# Patient Record
Sex: Female | Born: 1960 | Race: White | Hispanic: No | Smoking: Former smoker
Health system: Southern US, Community
[De-identification: ages and names within clinical notes are randomized; demographics above are authoritative.]

## PROBLEM LIST (undated history)

## (undated) DIAGNOSIS — G4730 Sleep apnea, unspecified: Secondary | ICD-10-CM

## (undated) DIAGNOSIS — E669 Obesity, unspecified: Secondary | ICD-10-CM

## (undated) DIAGNOSIS — E119 Type 2 diabetes mellitus without complications: Secondary | ICD-10-CM

## (undated) DIAGNOSIS — I10 Essential (primary) hypertension: Secondary | ICD-10-CM

## (undated) DIAGNOSIS — M1990 Unspecified osteoarthritis, unspecified site: Secondary | ICD-10-CM

## (undated) DIAGNOSIS — Z923 Personal history of irradiation: Secondary | ICD-10-CM

## (undated) DIAGNOSIS — C50919 Malignant neoplasm of unspecified site of unspecified female breast: Secondary | ICD-10-CM

## (undated) DIAGNOSIS — I272 Pulmonary hypertension, unspecified: Secondary | ICD-10-CM

## (undated) DIAGNOSIS — J45909 Unspecified asthma, uncomplicated: Secondary | ICD-10-CM

## (undated) HISTORY — DX: Malignant neoplasm of unspecified site of unspecified female breast: C50.919

## (undated) HISTORY — PX: CATARACT EXTRACTION: SUR2

---

## 2006-06-07 ENCOUNTER — Encounter: Admission: RE | Admit: 2006-06-07 | Discharge: 2006-06-07 | Payer: Self-pay | Admitting: Internal Medicine

## 2007-11-20 ENCOUNTER — Ambulatory Visit: Payer: Self-pay | Admitting: Internal Medicine

## 2008-02-14 ENCOUNTER — Emergency Department: Payer: Self-pay

## 2008-02-17 ENCOUNTER — Emergency Department: Payer: Self-pay | Admitting: Emergency Medicine

## 2009-03-01 ENCOUNTER — Ambulatory Visit: Payer: Self-pay | Admitting: Internal Medicine

## 2009-12-08 ENCOUNTER — Inpatient Hospital Stay: Payer: Self-pay | Admitting: Internal Medicine

## 2010-05-10 ENCOUNTER — Ambulatory Visit: Payer: Self-pay | Admitting: Internal Medicine

## 2010-07-09 ENCOUNTER — Emergency Department: Payer: Self-pay | Admitting: Emergency Medicine

## 2010-07-13 ENCOUNTER — Inpatient Hospital Stay: Payer: Self-pay | Admitting: Internal Medicine

## 2010-07-21 ENCOUNTER — Ambulatory Visit: Payer: Self-pay | Admitting: Internal Medicine

## 2010-07-22 ENCOUNTER — Ambulatory Visit: Payer: Self-pay | Admitting: Internal Medicine

## 2011-02-06 ENCOUNTER — Emergency Department: Payer: Self-pay | Admitting: Emergency Medicine

## 2011-04-18 ENCOUNTER — Inpatient Hospital Stay: Payer: Self-pay | Admitting: Physician Assistant

## 2011-04-18 LAB — CBC
HCT: 39.6 % (ref 35.0–47.0)
HGB: 13.3 g/dL (ref 12.0–16.0)
MCH: 29.1 pg (ref 26.0–34.0)
MCHC: 33.5 g/dL (ref 32.0–36.0)
MCV: 87 fL (ref 80–100)
Platelet: 215 10*3/uL (ref 150–440)
RBC: 4.55 10*6/uL (ref 3.80–5.20)
RDW: 13.3 % (ref 11.5–14.5)
WBC: 6.2 10*3/uL (ref 3.6–11.0)

## 2011-04-18 LAB — COMPREHENSIVE METABOLIC PANEL
Albumin: 4 g/dL (ref 3.4–5.0)
Alkaline Phosphatase: 70 U/L (ref 50–136)
Anion Gap: 13 (ref 7–16)
BUN: 10 mg/dL (ref 7–18)
Bilirubin,Total: 0.7 mg/dL (ref 0.2–1.0)
Calcium, Total: 9.1 mg/dL (ref 8.5–10.1)
Chloride: 104 mmol/L (ref 98–107)
Co2: 22 mmol/L (ref 21–32)
Creatinine: 0.89 mg/dL (ref 0.60–1.30)
EGFR (African American): >60
EGFR (Non-African Amer.): >60
Glucose: 120 mg/dL — ABNORMAL HIGH (ref 65–99)
Osmolality: 278 (ref 275–301)
Potassium: 4 mmol/L (ref 3.5–5.1)
SGOT(AST): 41 U/L — ABNORMAL HIGH (ref 15–37)
SGPT (ALT): 51 U/L
Sodium: 139 mmol/L (ref 136–145)
Total Protein: 7.6 g/dL (ref 6.4–8.2)

## 2011-04-18 LAB — LIPID PANEL
Cholesterol: 163 mg/dL (ref 0–200)
HDL Cholesterol: 77 mg/dL — ABNORMAL HIGH (ref 40–60)
Ldl Cholesterol, Calc: 66 mg/dL (ref 0–100)
Triglycerides: 99 mg/dL (ref 0–200)
VLDL Cholesterol, Calc: 20 mg/dL (ref 5–40)

## 2011-04-18 LAB — RAPID INFLUENZA A&B ANTIGENS

## 2011-04-18 LAB — TSH: Thyroid Stimulating Horm: 1.98 u[IU]/mL

## 2011-04-18 LAB — TROPONIN I: Troponin-I: <0.02 ng/mL

## 2011-04-19 LAB — CBC WITH DIFFERENTIAL/PLATELET
Basophil #: 0 10*3/uL (ref 0.0–0.1)
Basophil %: 0.4 %
Eosinophil #: 0 10*3/uL (ref 0.0–0.7)
Eosinophil %: 0.1 %
HCT: 39.9 % (ref 35.0–47.0)
HGB: 13.2 g/dL (ref 12.0–16.0)
Lymphocyte #: 0.7 10*3/uL — ABNORMAL LOW (ref 1.0–3.6)
Lymphocyte %: 14.6 %
MCH: 28.9 pg (ref 26.0–34.0)
MCHC: 33.1 g/dL (ref 32.0–36.0)
MCV: 87 fL (ref 80–100)
Monocyte #: 0.1 10*3/uL (ref 0.0–0.7)
Monocyte %: 2.2 %
Neutrophil #: 4 10*3/uL (ref 1.4–6.5)
Neutrophil %: 82.7 %
Platelet: 191 10*3/uL (ref 150–440)
RBC: 4.57 10*6/uL (ref 3.80–5.20)
RDW: 13.7 % (ref 11.5–14.5)
WBC: 4.8 10*3/uL (ref 3.6–11.0)

## 2011-04-19 LAB — BASIC METABOLIC PANEL
Anion Gap: 14 (ref 7–16)
BUN: 13 mg/dL (ref 7–18)
Calcium, Total: 9.2 mg/dL (ref 8.5–10.1)
Chloride: 102 mmol/L (ref 98–107)
Co2: 24 mmol/L (ref 21–32)
Creatinine: 0.89 mg/dL (ref 0.60–1.30)
EGFR (African American): >60
EGFR (Non-African Amer.): >60
Glucose: 274 mg/dL — ABNORMAL HIGH (ref 65–99)
Osmolality: 289 (ref 275–301)
Potassium: 4.5 mmol/L (ref 3.5–5.1)
Sodium: 140 mmol/L (ref 136–145)

## 2011-04-19 LAB — HEMOGLOBIN A1C: Hemoglobin A1C: 7.7 % — ABNORMAL HIGH (ref 4.2–6.3)

## 2011-04-20 LAB — BASIC METABOLIC PANEL
Anion Gap: 9 (ref 7–16)
BUN: 18 mg/dL (ref 7–18)
Calcium, Total: 9.4 mg/dL (ref 8.5–10.1)
Chloride: 101 mmol/L (ref 98–107)
Co2: 27 mmol/L (ref 21–32)
Creatinine: 0.91 mg/dL (ref 0.60–1.30)
EGFR (African American): >60
EGFR (Non-African Amer.): >60
Glucose: 280 mg/dL — ABNORMAL HIGH (ref 65–99)
Osmolality: 286 (ref 275–301)
Potassium: 4.1 mmol/L (ref 3.5–5.1)
Sodium: 137 mmol/L (ref 136–145)

## 2011-04-20 LAB — CBC WITH DIFFERENTIAL/PLATELET
Basophil #: 0 10*3/uL (ref 0.0–0.1)
Basophil %: 0.2 %
Eosinophil #: 0 10*3/uL (ref 0.0–0.7)
Eosinophil %: 0 %
HCT: 40.5 % (ref 35.0–47.0)
HGB: 13.4 g/dL (ref 12.0–16.0)
Lymphocyte #: 1.4 10*3/uL (ref 1.0–3.6)
Lymphocyte %: 11.1 %
MCH: 29.2 pg (ref 26.0–34.0)
MCHC: 33 g/dL (ref 32.0–36.0)
MCV: 88 fL (ref 80–100)
Monocyte #: 0.6 10*3/uL (ref 0.0–0.7)
Monocyte %: 4.9 %
Neutrophil #: 10.5 10*3/uL — ABNORMAL HIGH (ref 1.4–6.5)
Neutrophil %: 83.8 %
Platelet: 231 10*3/uL (ref 150–440)
RBC: 4.58 10*6/uL (ref 3.80–5.20)
RDW: 13.3 % (ref 11.5–14.5)
WBC: 12.5 10*3/uL — ABNORMAL HIGH (ref 3.6–11.0)

## 2011-04-21 LAB — CBC WITH DIFFERENTIAL/PLATELET
Basophil #: 0 10*3/uL (ref 0.0–0.1)
Basophil %: 0.3 %
Eosinophil #: 0 10*3/uL (ref 0.0–0.7)
Eosinophil %: 0 %
HCT: 39.1 % (ref 35.0–47.0)
HGB: 12.9 g/dL (ref 12.0–16.0)
Lymphocyte #: 1.4 10*3/uL (ref 1.0–3.6)
Lymphocyte %: 12.3 %
MCH: 29.2 pg (ref 26.0–34.0)
MCHC: 33.2 g/dL (ref 32.0–36.0)
MCV: 88 fL (ref 80–100)
Monocyte #: 0.5 10*3/uL (ref 0.0–0.7)
Monocyte %: 4.7 %
Neutrophil #: 9.2 10*3/uL — ABNORMAL HIGH (ref 1.4–6.5)
Neutrophil %: 82.7 %
Platelet: 245 10*3/uL (ref 150–440)
RBC: 4.44 10*6/uL (ref 3.80–5.20)
RDW: 12.9 % (ref 11.5–14.5)
WBC: 11.1 10*3/uL — ABNORMAL HIGH (ref 3.6–11.0)

## 2011-04-21 LAB — BASIC METABOLIC PANEL
Anion Gap: 14 (ref 7–16)
BUN: 22 mg/dL — ABNORMAL HIGH (ref 7–18)
Calcium, Total: 9.1 mg/dL (ref 8.5–10.1)
Chloride: 100 mmol/L (ref 98–107)
Co2: 26 mmol/L (ref 21–32)
Creatinine: 0.93 mg/dL (ref 0.60–1.30)
EGFR (African American): >60
EGFR (Non-African Amer.): >60
Glucose: 284 mg/dL — ABNORMAL HIGH (ref 65–99)
Osmolality: 293 (ref 275–301)
Potassium: 4 mmol/L (ref 3.5–5.1)
Sodium: 140 mmol/L (ref 136–145)

## 2011-04-24 LAB — CULTURE, BLOOD (SINGLE)

## 2011-07-15 ENCOUNTER — Emergency Department: Payer: Self-pay | Admitting: Emergency Medicine

## 2012-03-03 ENCOUNTER — Inpatient Hospital Stay: Payer: Self-pay | Admitting: Internal Medicine

## 2012-03-03 LAB — CBC
HCT: 39.2 % (ref 35.0–47.0)
HGB: 12.7 g/dL (ref 12.0–16.0)
MCH: 28.4 pg (ref 26.0–34.0)
MCHC: 32.4 g/dL (ref 32.0–36.0)
MCV: 88 fL (ref 80–100)
Platelet: 211 10*3/uL (ref 150–440)
RBC: 4.47 10*6/uL (ref 3.80–5.20)
RDW: 13.6 % (ref 11.5–14.5)
WBC: 12.8 10*3/uL — ABNORMAL HIGH (ref 3.6–11.0)

## 2012-03-03 LAB — BASIC METABOLIC PANEL
Anion Gap: 6 — ABNORMAL LOW (ref 7–16)
BUN: 10 mg/dL (ref 7–18)
Calcium, Total: 8.9 mg/dL (ref 8.5–10.1)
Chloride: 102 mmol/L (ref 98–107)
Co2: 29 mmol/L (ref 21–32)
Creatinine: 0.73 mg/dL (ref 0.60–1.30)
EGFR (African American): >60
EGFR (Non-African Amer.): >60
Glucose: 134 mg/dL — ABNORMAL HIGH (ref 65–99)
Osmolality: 275 (ref 275–301)
Potassium: 4 mmol/L (ref 3.5–5.1)
Sodium: 137 mmol/L (ref 136–145)

## 2012-03-03 LAB — RAPID INFLUENZA A&B ANTIGENS

## 2012-03-04 LAB — CBC WITH DIFFERENTIAL/PLATELET
Basophil #: 0 10*3/uL (ref 0.0–0.1)
Basophil %: 0.2 %
Eosinophil #: 0 10*3/uL (ref 0.0–0.7)
Eosinophil %: 0 %
HCT: 37.1 % (ref 35.0–47.0)
HGB: 12.3 g/dL (ref 12.0–16.0)
Lymphocyte #: 1 10*3/uL (ref 1.0–3.6)
Lymphocyte %: 8 %
MCH: 29 pg (ref 26.0–34.0)
MCHC: 33.3 g/dL (ref 32.0–36.0)
MCV: 87 fL (ref 80–100)
Monocyte #: 0.5 x10 3/mm (ref 0.2–0.9)
Monocyte %: 3.8 %
Neutrophil #: 11.1 10*3/uL — ABNORMAL HIGH (ref 1.4–6.5)
Neutrophil %: 88 %
Platelet: 214 10*3/uL (ref 150–440)
RBC: 4.25 10*6/uL (ref 3.80–5.20)
RDW: 13.3 % (ref 11.5–14.5)
WBC: 12.6 10*3/uL — ABNORMAL HIGH (ref 3.6–11.0)

## 2012-03-04 LAB — BASIC METABOLIC PANEL
Anion Gap: 7 (ref 7–16)
BUN: 14 mg/dL (ref 7–18)
Calcium, Total: 9.2 mg/dL (ref 8.5–10.1)
Chloride: 104 mmol/L (ref 98–107)
Co2: 26 mmol/L (ref 21–32)
Creatinine: 0.72 mg/dL (ref 0.60–1.30)
EGFR (African American): >60
EGFR (Non-African Amer.): >60
Glucose: 268 mg/dL — ABNORMAL HIGH (ref 65–99)
Osmolality: 284 (ref 275–301)
Potassium: 4.2 mmol/L (ref 3.5–5.1)
Sodium: 137 mmol/L (ref 136–145)

## 2012-03-04 LAB — MAGNESIUM: Magnesium: 1.8 mg/dL

## 2012-03-04 LAB — HEMOGLOBIN A1C: Hemoglobin A1C: 6.5 % — ABNORMAL HIGH (ref 4.2–6.3)

## 2012-03-06 LAB — CBC WITH DIFFERENTIAL/PLATELET
Basophil #: 0 10*3/uL (ref 0.0–0.1)
Basophil %: 0 %
Eosinophil #: 0 10*3/uL (ref 0.0–0.7)
Eosinophil %: 0.1 %
HCT: 36.5 % (ref 35.0–47.0)
HGB: 12.7 g/dL (ref 12.0–16.0)
Lymphocyte #: 1.4 10*3/uL (ref 1.0–3.6)
Lymphocyte %: 9.2 %
MCH: 30.3 pg (ref 26.0–34.0)
MCHC: 34.8 g/dL (ref 32.0–36.0)
MCV: 87 fL (ref 80–100)
Monocyte #: 0.8 x10 3/mm (ref 0.2–0.9)
Monocyte %: 5.6 %
Neutrophil #: 12.5 10*3/uL — ABNORMAL HIGH (ref 1.4–6.5)
Neutrophil %: 85.1 %
Platelet: 255 10*3/uL (ref 150–440)
RBC: 4.19 10*6/uL (ref 3.80–5.20)
RDW: 13.2 % (ref 11.5–14.5)
WBC: 14.7 10*3/uL — ABNORMAL HIGH (ref 3.6–11.0)

## 2012-03-06 LAB — EXPECTORATED SPUTUM ASSESSMENT W GRAM STAIN, RFLX TO RESP C

## 2012-03-08 LAB — BETA STREP CULTURE(ARMC)

## 2012-03-09 LAB — CULTURE, BLOOD (SINGLE)

## 2012-06-06 ENCOUNTER — Ambulatory Visit: Payer: Self-pay | Admitting: Internal Medicine

## 2012-07-24 ENCOUNTER — Emergency Department: Payer: Self-pay | Admitting: Internal Medicine

## 2013-05-24 ENCOUNTER — Emergency Department: Payer: Self-pay | Admitting: Emergency Medicine

## 2013-05-24 LAB — BASIC METABOLIC PANEL
Anion Gap: 8 (ref 7–16)
BUN: 17 mg/dL (ref 7–18)
Calcium, Total: 8.6 mg/dL (ref 8.5–10.1)
Chloride: 102 mmol/L (ref 98–107)
Co2: 25 mmol/L (ref 21–32)
Creatinine: 1.02 mg/dL (ref 0.60–1.30)
EGFR (African American): >60
EGFR (Non-African Amer.): >60
Glucose: 173 mg/dL — ABNORMAL HIGH (ref 65–99)
Osmolality: 276 (ref 275–301)
Potassium: 3.5 mmol/L (ref 3.5–5.1)
Sodium: 135 mmol/L — ABNORMAL LOW (ref 136–145)

## 2013-05-24 LAB — CBC
HCT: 43.1 % (ref 35.0–47.0)
HGB: 14.1 g/dL (ref 12.0–16.0)
MCH: 28.7 pg (ref 26.0–34.0)
MCHC: 32.8 g/dL (ref 32.0–36.0)
MCV: 87 fL (ref 80–100)
Platelet: 236 10*3/uL (ref 150–440)
RBC: 4.93 10*6/uL (ref 3.80–5.20)
RDW: 13.1 % (ref 11.5–14.5)
WBC: 13.3 10*3/uL — ABNORMAL HIGH (ref 3.6–11.0)

## 2013-05-24 LAB — TROPONIN I: Troponin-I: <0.02 ng/mL

## 2013-07-14 ENCOUNTER — Observation Stay: Payer: Self-pay | Admitting: Internal Medicine

## 2013-07-14 LAB — CBC
HCT: 38.8 % (ref 35.0–47.0)
HGB: 12.9 g/dL (ref 12.0–16.0)
MCH: 29 pg (ref 26.0–34.0)
MCHC: 33.3 g/dL (ref 32.0–36.0)
MCV: 87 fL (ref 80–100)
Platelet: 221 10*3/uL (ref 150–440)
RBC: 4.45 10*6/uL (ref 3.80–5.20)
RDW: 13.6 % (ref 11.5–14.5)
WBC: 8.1 10*3/uL (ref 3.6–11.0)

## 2013-07-14 LAB — BASIC METABOLIC PANEL
Anion Gap: 3 — ABNORMAL LOW (ref 7–16)
BUN: 10 mg/dL (ref 7–18)
Calcium, Total: 8.8 mg/dL (ref 8.5–10.1)
Chloride: 110 mmol/L — ABNORMAL HIGH (ref 98–107)
Co2: 28 mmol/L (ref 21–32)
Creatinine: 0.92 mg/dL (ref 0.60–1.30)
EGFR (African American): >60
EGFR (Non-African Amer.): >60
Glucose: 116 mg/dL — ABNORMAL HIGH (ref 65–99)
Osmolality: 281 (ref 275–301)
Potassium: 3.7 mmol/L (ref 3.5–5.1)
Sodium: 141 mmol/L (ref 136–145)

## 2013-07-14 LAB — TROPONIN I: Troponin-I: <0.02 ng/mL

## 2013-07-15 LAB — CBC WITH DIFFERENTIAL/PLATELET
Basophil #: 0 10*3/uL (ref 0.0–0.1)
Basophil %: 0 %
Eosinophil #: 0 10*3/uL (ref 0.0–0.7)
Eosinophil %: 0 %
HCT: 37.3 % (ref 35.0–47.0)
HGB: 12.5 g/dL (ref 12.0–16.0)
Lymphocyte #: 1 10*3/uL (ref 1.0–3.6)
Lymphocyte %: 10.9 %
MCH: 29.2 pg (ref 26.0–34.0)
MCHC: 33.5 g/dL (ref 32.0–36.0)
MCV: 87 fL (ref 80–100)
Monocyte #: 0.4 x10 3/mm (ref 0.2–0.9)
Monocyte %: 4 %
Neutrophil #: 8 10*3/uL — ABNORMAL HIGH (ref 1.4–6.5)
Neutrophil %: 85.1 %
Platelet: 196 10*3/uL (ref 150–440)
RBC: 4.28 10*6/uL (ref 3.80–5.20)
RDW: 13.7 % (ref 11.5–14.5)
WBC: 9.4 10*3/uL (ref 3.6–11.0)

## 2013-07-15 LAB — BASIC METABOLIC PANEL
Anion Gap: 8 (ref 7–16)
BUN: 14 mg/dL (ref 7–18)
Calcium, Total: 9.1 mg/dL (ref 8.5–10.1)
Chloride: 104 mmol/L (ref 98–107)
Co2: 26 mmol/L (ref 21–32)
Creatinine: 1.05 mg/dL (ref 0.60–1.30)
EGFR (African American): >60
EGFR (Non-African Amer.): >60
Glucose: 212 mg/dL — ABNORMAL HIGH (ref 65–99)
Osmolality: 282 (ref 275–301)
Potassium: 4.1 mmol/L (ref 3.5–5.1)
Sodium: 138 mmol/L (ref 136–145)

## 2013-07-19 LAB — CULTURE, BLOOD (SINGLE)

## 2013-09-27 ENCOUNTER — Emergency Department: Payer: Self-pay | Admitting: Emergency Medicine

## 2014-02-17 ENCOUNTER — Ambulatory Visit: Payer: Self-pay | Admitting: Internal Medicine

## 2014-03-03 ENCOUNTER — Ambulatory Visit: Payer: Self-pay | Admitting: Internal Medicine

## 2014-06-12 NOTE — Discharge Summary (Signed)
PATIENT NAME:  Nancy Patton, Nancy Patton MR#:  [id number] DATE OF BIRTH:  [birth year]  DATE OF ADMISSION:  03/03/2012 DATE OF DISCHARGE:  03/07/2012  DISCHARGE DIAGNOSIS:  Asthma exacerbation.   SECONARY DIAGOSES:  Diabetes mellitus, morbid obesity, asthma.   DISCHARGE MEDICATIONS:  Additional medications:   1.  Prednisone 60 mg taper by 10 mg every day.  2.  Levaquin 500 mg daily x 5 days.  3.  Glipizide 5 mg twice a day while she is on prednisone taper.   4.  The patient to resume other home medications per discharge medical reconciliation.   DISCHARGE INSTRUCTIONS:   DIET:  ADA diet.   ACTIVITY LIMITATIONS:  None.   FOLLOW-UP:  One to two weeks with Dr. Lamonte Sakai.   HOSPITAL COURSE:  Threasa Beards was admitted through the Emergency Room complaining of acute shortness of breath and wheezing.  Impression of acute respiratory failure, was admitted to the Emergency Room with pneumonia, early sepsis.  Please refer to history and physical for details.  The patient was treated with ceftriaxone, erythromycin, nebulized bronchodilators, high-dose intravenous steroids.  She made a slow, but sustained improvement over the next three days with marked improvement in oxygen requirements and discharged to home without home oxygen.  Hospital stay was otherwise uncomplicated.  Her blood sugars were elevated due to steroids, however this is management with Lantus insulin with bolus short-acting insulin.   PROCEDURES:  Imaging, chest x-ray.   DISPOSITION:  To home.   DISCHARGE PROCESS TIME SPENT:  Thirty-seven minutes.     ____________________________ Venetia Maxon Elijio Miles, MD sat:ea D: 03/15/2012 12:55:27 ET T: 03/15/2012 23:34:41 ET JOB#: [id number]  cc: Sheikh A. Elijio Miles, MD, <Dictator> Veverly Fells MD ELECTRONICALLY SIGNED 03/18/2012 15:03

## 2014-06-12 NOTE — H&P (Signed)
PATIENT NAME:  Nancy Patton, Nancy Patton MR#:  [id number] DATE OF BIRTH:  04-Dec-1960  DATE OF ADMISSION:  03/03/2012  REFERRING PHYSICIAN: Shirley Friar. Michel Santee, MD   PRIMARY CARE PHYSICIAN: Lamonte Sakai, MD  CHIEF COMPLAINT: Shortness of breath.   HISTORY OF PRESENT ILLNESS: The patient is a pleasant 54 year old Caucasian female with obesity, asthma, hypertension and history of pneumonia, who presents with 2 days of shortness of breath, cough which is productive with brownish sputum with fevers. The patient stated that she has progressively gotten worse despite her nebulizers and inhalers. She has no sick contacts. She has had brownish sputum and here has had several nebs without significant improvement. She was noted to have some hypoxia with O2 saturations of 88%. She is on home oxygen only as needed, mostly at nights. She has low-grade fever here. X-ray of the chest also shows a probable pneumonia. Hospitalist services were contacted for further evaluation and management. The patient has had no sick contacts or recent antibiotic use.   PAST MEDICAL HISTORY:  Admission for pneumonia in the past, asthma with asthma exacerbations in the past, diabetes, hypertension, obesity.   OUTPATIENT MEDICATIONS:  HCTZ/losartan 12.5/50 mg 1 tab once a day, metformin 1000 mg 2 times a day, ProAir HFA 2 puffs 4 times a day as needed for shortness of breath, Singulair 10 mg once a day, Spiriva 18 mcg daily, Symbicort 160/4.5 mcg 2 puffs 2 times a day.   ALLERGIES: None.   FAMILY HISTORY: Mom with high blood pressure.  Dad with diabetes.   SOCIAL HISTORY: Works in [employer] as a Museum/gallery conservator. No tobacco, alcohol or drug use.   REVIEW OF SYSTEMS:   CONSTITUTIONAL: Positive for fatigue, weakness. No weight changes.  EYES: No blurry vision or double vision.  ENT: No tinnitus. Positive for headache and some sore throat and hoarseness.   RESPIRATORY: Positive for cough, wheezing, shortness of breath, dyspnea on exertion and  history of asthma.  CARDIOVASCULAR: No chest pain, palpitations or orthopnea. High blood pressure history is there.  GASTROINTESTINAL: No nausea, vomiting, diarrhea, abdominal pain, hematemesis or bright red blood per rectum.  GENITOURINARY: No dysuria or hematuria.  SKIN: No new rashes.  MUSCULOSKELETAL: Some aches and pains but no focal weakness or numbness.  HEMATOLOGIC/LYMPHATIC: No anemia or easy bruising.  NEUROLOGICAL: No numbness focally, no weakness focally. No history of transient ischemic attack.  PSYCHIATRIC: No anxiety or insomnia.   PHYSICAL EXAMINATION: VITAL SIGNS: Temperature on arrival was 100.3, pulse 102, respiratory rate 20, blood pressure 125/68, O2 sat was 88% on room air.  GENERAL: The patient is an obese Caucasian female lying in bed in mild respiratory distress.  HEENT: Normocephalic, atraumatic. Pupils are equal and reactive. Anicteric sclerae. Extraocular muscles are intact. Moist mucous membranes.  NECK: Supple. No significant lymphadenopathy or thyroid tenderness in the neck examination.  LUNGS: Diffuse rhonchi and wheezing. Decreased breath sounds and air movement of bilateral lungs, more in the bases. Good effort, tachypneic.  CARDIOVASCULAR: S1, S2, tachycardic. No murmurs, rubs, or gallops.  ABDOMEN: Soft, nontender, nondistended. Positive bowel sounds in all quadrants.  EXTREMITIES: No significant edema.  SKIN: Scattered tattoos.  NEUROLOGICAL: Cranial nerves II through XII are grossly intact. Strength is 5 out of 5 in all extremities. Sensation is intact to light touch.  PSYCHIATRIC: Awake, alert, oriented x3, pleasant and cooperative.   LABORATORY, DIAGNOSTIC AND RADIOLOGICAL DATA:  Glucose 134, BUN 10, creatinine 0.73. WBC 10.8, hemoglobin 12.7, platelets 211. Rapid flu negative.   X-ray of the  chest, PA and lateral: Showing abnormal density in the right infrahilar region posteriorly and the right middle lobe consistent with atelectasis or pneumonia.    ASSESSMENT AND PLAN: We have a pleasant 54 year old morbidly obese Caucasian female with asthma, with pneumonia history in the past, with chronic respiratory failure on p.r.n. oxygen nocturnally, who presents with acute on chronic respiratory failure, shortness of breath, wheezing, productive cough, with sepsis with tachycardia, leukocytosis and evidence for a probable pneumonia. At this point, we would admit the patient to the hospital. She presented with O2 sats 88% on room air, wheezing, labored, and still is short of breath and has significant wheezing. I would start IV steroids and around-the-clock nebulizers, continue the Spiriva and Singulair. I would start the patient on ceftriaxone and azithromycin at this point, and keep the oxygen saturations above 95%, and consider BiPAP if she does not improve.   In regards to the sepsis, it is likely from the pneumonia. She has tachycardia and leukocytosis. She has no sick contacts. Rapid flu is negative. The patient does have some sore throat, and we would check a strep throat for that, order blood cultures and follow with sputum cultures which have been ordered.   In regards to her diabetes, we would hold the metformin and start sliding scale insulin, and I anticipate her sugars might be elevated given the steroids she will be on. I would continue the outpatient blood pressure medications at this point and start the patient on DVT prophylaxis with Lovenox.   CODE STATUS: The patient is FULL CODE.    TOTAL TIME SPENT:  60 minutes.  ____________________________ Vivien Presto, MD sa:cb D: 03/03/2012 12:22:33 ET T: 03/03/2012 14:45:14 ET JOB#: [id number]  cc: Vivien Presto, MD, <Dictator> Perrin Maltese, MD Karel Jarvis [facility] MD ELECTRONICALLY SIGNED 03/11/2012 20:35

## 2014-06-13 NOTE — Discharge Summary (Signed)
PATIENT NAME:  Nancy Patton, Nancy Patton MR#:  [id number] DATE OF BIRTH:  Aug 10, 1960  DATE OF ADMISSION:  07/14/2013 DATE OF DISCHARGE:  07/16/2013  ADMITTING PHYSICIAN: Epifanio Lesches, MD  DISCHARGING PHYSICIAN: Pernell Dupre, MD  ADMITTING DIAGNOSIS: Acute exacerbation of asthma.   SECONDARY DIAGNOSES:  1.  Hypertension.  2.  Type 2 diabetes mellitus.  3.  Pneumonia.  IMAGING: Chest x-ray.   PROCEDURES PERFORMED: None.   HOSPITAL COURSE: This lady was admitted through the Emergency Room complaining of  dyspnea and increased wheezing. Her chest x-ray revealed minimal left basilar opacity concerning for mild pneumonia. The patient was admitted to the medical floor. She received antibiotics for pneumonia, high dose intravenous steroids and bronchodilators for asthma. Her symptoms improved considerably over the next 48 hours. Her hospital stay was otherwise uncomplicated and she was discharged to home in satisfactory condition.   DIET: Low-sodium, low-fat, low-cholesterol, ADA diet.   ACTIVITY: As tolerated.   DISCHARGE FOLLOWUP: Follow up with Dr. Elijio Miles or Dr. Lamonte Sakai in 1 to 2 weeks.   DISCHARGE MEDICATIONS:  1.  Augmentin 875 mg b.i.d.  2.  Zithromax 500 mg daily. 3.  Prednisone 60 mg taper, reduce x 10 mg until complete.  4.  Dulera inhaler 2 puffs b.i.d.  5.  Escitalopram 20 mg daily.  6.  DuoNeb inhaler p.r.n. q. 4.  7.  Metformin 1000 mg b.i.d.  8.  Singulair 10 mg q. p.m.  9.  Hydrochlorothiazide/losartan 12.5/50 daily.   DISCHARGE PROCESS TIME SPENT: 35 minutes.   ____________________________ Venetia Maxon Elijio Miles, MD sat:sb D: 07/29/2013 13:34:25 ET T: 07/29/2013 13:54:13 ET JOB#: [id number]  cc: Alfredia Ferguson A. Elijio Miles, MD, <Dictator> Veverly Fells MD ELECTRONICALLY SIGNED 08/01/2013 13:30

## 2014-06-13 NOTE — H&P (Signed)
PATIENT NAME:  Nancy Patton, HECKMANN MR#:  [id number] DATE OF BIRTH:  01-14-1961  DATE OF ADMISSION:  07/14/2013  PRIMARY CARE PHYSICIAN: Lamonte Sakai, MD  EMERGENCY ROOM PHYSICIAN: Lurline Hare, MD  CHIEF COMPLAINT: Trouble breathing.   HISTORY OF PRESENT ILLNESS: This is a 54 year old female patient with morbid obesity, diabetes, and hypertension who came in because of shortness of breath. The patient has been having trouble breathing for about 3 to 4 days associated with wheezing, cough and productive phlegm. The patient did not have any fever. The patient tried inhalers and nebulizers at home with no relief. The patient came in because of trouble breathing. The patient received nebulizers and steroids but still did not improve so ER doctor asked [place] to admit. The patient's chest x-ray showed slight pneumonia, but main problem is her significant wheezing and also hypoxia. Her O2 sat dropped to 92 to 93% on 1 liter. The patient denies any pedal edema or orthopnea. Has history of asthma for a long time. No history of smoking. The patient has no history of sick contacts.   PAST MEDICAL HISTORY: Significant for hypertension, diabetes mellitus type 2, asthma and seasonal allergies.   SOCIAL HISTORY: No smoking, no drinking, no drugs. Works at [employer].   FAMILY HISTORY: Mother has diabetes.   PAST SURGICAL HISTORY: No history of surgeries.  HOME MEDICATIONS: Include:  1.  Celexa 20 mg p.o. daily. 2.  Dulera 5/200 mcg 2 puffs b.i.d.  3.  DuoNebs every 4 hours as needed.  4.  HCTZ/losartan 12.5/50 mg p.o. daily.  5.  Metformin 1 gram p.o. b.i.d.  6.  Singulair 10 mg p.o. daily.   REVIEW OF SYSTEMS:  CONSTITUTIONAL: Feels fatigued.  EYES: No blurred vision.  ENT: No tinnitus. No ear pain. No epistaxis. No difficulty swallowing.  RESPIRATORY: Does have cough, trouble breathing and wheezing going on for 3 to 4 days.  CARDIOVASCULAR: No chest pain. Does feel tight in the chest. No orthopnea,  PND. No  palpitations.  GASTROINTESTINAL: No nausea. No vomiting. No abdominal pain.  GENITOURINARY: No dysuria.  ENDOCRINE: No polyuria or nocturia.  INTEGUMENTARY: No skin rashes.  MUSCULOSKELETAL: No joint pain.  NEUROLOGIC: No numbness or weakness.  PSYCHIATRIC: No anxiety or insomnia.   PHYSICAL EXAMINATION: VITAL SIGNS: Temperature 98.4, heart rate 84, blood pressure 174/96, and O2 sats dropped to 92% on room air. On 1 liter, sats are 96%, but occasionally drop to 93% on 1 liter.  GENERAL: This is a 54 year old well-developed, well-nourished female, not in distress at this time. HEENT: Head: Normocephalic, atraumatic. Eyes: Pupils equally reacting to light. Ears: External auditory canals are normal. Nose: No turbinate hypertrophy. No oropharyngeal erythema.  NECK: Supple. No JVD. No carotid bruit. The patient able to move neck with normal range of motion without any pain. No JVD.  HEART: S1, S2 regular. No murmurs. PMI not displaced. Pulses are intact in femoral and dorsalis pedis.  LUNGS: The patient has bilateral expiratory wheeze in all lung fields. The patient is not using accessory muscles of respiration.  ABDOMEN: Soft, nontender, nondistended. Bowel sounds present. No hernias. The patient has no CVA tenderness.  SKIN: Normal color. No rashes. Warm and dry.  EXTREMITIES: Normal range of motion. No pedal edema. The patient has no calf sign.  NEUROLOGIC: Alert, awake, oriented. Cranial nerves II through XII intact. Power 5/5 in upper and lower extremities. Sensation is intact. DTRs 2+ bilaterally.  PSYCHIATRIC: Mood and affect are within normal limits.   DIAGNOSTIC  DATA: WBC 8.1, hemoglobin 12.9, hematocrit 38.8, platelets 221. Troponin less than 0.02. Electrolytes: Sodium 141, potassium 3.7, chloride 110, bicarbonate 28, BUN 10, creatinine 0.93, glucose 116.   Chest x-ray shows minimal left base opacity concerning for mild pneumonia.   ASSESSMENT AND PLAN: 1.  The patient is a  54 year old female patient with acute hypoxia secondary to asthma attack. The patient is going to be admitted to hospitalist service under observation for asthma attack. Will continue IV steroids for 24 hours along with IV antibiotics and continue DuoNebs q. 4 hours and see how she does. I believe she should be able to turn around nicely and will be discharged in a day. If she does not get better, she will be continued on antibiotics and repeat chest x-ray.  2.  Hypertension. Continue home medications with HCTZ and losartan.  3.  Diabetes mellitus, type 2. Continue metformin. Will do sliding scale with coverage also because she is on steroids and sugars will be high. 4.  History of allergies. The patient is on Singulair, continue that.   We are going to sign out to Dr. Elijio Miles.   TIME SPENT ON HISTORY AND PHYSICAL: About 55 minutes.  ____________________________ Epifanio Lesches, MD sk:sb D: 07/14/2013 07:44:13 ET T: 07/14/2013 08:26:21 ET JOB#: [id number]  cc: Epifanio Lesches, MD, <Dictator> Epifanio Lesches MD ELECTRONICALLY SIGNED 07/17/2013 13:21

## 2014-06-14 NOTE — Discharge Summary (Signed)
PATIENT NAME:  Nancy Patton, Nancy Patton MR#:  [id number] DATE OF BIRTH:  10-19-1960  DATE OF ADMISSION:  04/18/2011 DATE OF DISCHARGE:  04/21/2011  DISCHARGE DIAGNOSES:  1. Chronic asthmatic bronchitis/chronic obstructive asthma. 2. Exacerbation of #1 above. 3. Diabetes mellitus. 4. Morbid obesity.  5. Hypertension.   DISCHARGE MEDICATIONS: The patient is to continue using her albuterol rescue inhaler (SABA) and/or DuoNebs as needed up to 4 times a day for acute asthmatic bronchitis symptoms. The patient has been educated on safe use of both of these so as to avoid overuse. The patient is to continue taking hydrochlorothiazide/losartan 12.5 mg/50 mg.   Additionally, the patient's medication regimen was adjusted extensively while she is in the hospital, and the patient will begin taking the following medications, as per Internal Medicine and Pulmonology.  1. Symbicort 160/4.5, two puffs twice a day; gargle, swish, and spit after use.  2. Spiriva, inhale the contents of 1 capsule once a day (for best effect, do 2 inhalations for every 1 capsule).  3. Theophylline SA tablet (12 hour)/Theo-Dur, 100 mg, 1 tab twice a day with meals as directed by pulmonology.  4. Singulair, 10 mg, 1 tablet by mouth once a day.  5. Prednisone, 20 mg, wean as directed and as per your hand-written instructions.  6. Augmentin, 875-125 mg, one tab by mouth twice a day for seven days.  7. Azithromycin, 250 mg, 1 tab by mouth once a day for two more days.  8. Ondansetron, 4 mg, 1 tab by mouth up to every eight hours as needed for nausea while taking steroids.  9. Metformin, 1000 mg, 1 tab by mouth twice a day with meals.  10. Glipizide, 5 mg, 1 tab by mouth once a day when taking steroids.   HOSPITAL COURSE: This patient is a 54 year old Caucasian female with past medical history significant for refractory asthmatic bronchitis/chronic obstructive asthma who presented to [facility] on the date of admission  with complaints shortness of breath and cough. Please see history and physical examination from the date of admission for full details regarding initial triage, evaluation, and treatment. The patient was admitted to a standard medical bed and received treatment in the standard fashion for acute asthmatic bronchitis/chronic obstructive asthma exacerbation, including IV steroids, DuoNebs, and the like. PA and lateral chest x-ray revealed findings consistent with possible atelectasis of the right middle lobe. However, these findings were clinically questionable given the patient's presentation, including, among other things, a reported temperature of 102 degrees Fahrenheit on initial presentation to the emergency room. Thus, the patient was covered with standard antimicrobial agents for possible community-acquired pneumonia. Pulmonology was consulted regarding the patient's care as well. Internal Medicine and Pulmonology did adjust the patient's medication throughout her hospitalization, and these adjustments did result in remarkable improvement in the patient's symptoms. The patient rapidly improved during her hospital stay. On the day of discharge, the patient reported feeling "great" and was eager to return to home and work. The patient was discharged from the hospital in satisfactory condition.  DIET: ADA, low cholesterol diet.   ACTIVITY: No overly-exertional activity/activity as tolerated within safe and reasonable limitations.  DISCHARGE FOLLOWUP: Follow-up visit with Dr. Devona Konig and Wilnette Kales PA-C, MSPAS, both within one week of hospital discharge.   TIME SPENT: Discharge time spent including coordination of care, patient education, and the like was greater than 30 minutes. ____________________________ Kizzie Furnish Audie Clear, dictating on behalf of Dr. Elijio Miles mgm:slb D: 04/21/2011 10:33:31 ET T: 04/21/2011 79:02:40  ET JOB#: [id number]  cc: Kizzie Furnish. Prudence Davidson, PA, <Dictator> Elwyn Reach  PA ELECTRONICALLY SIGNED 05/01/2011 19:43 Veverly Fells MD ELECTRONICALLY SIGNED 05/06/2011 12:29

## 2014-06-14 NOTE — H&P (Signed)
PATIENT NAME:  Nancy Patton, Nancy Patton MR#:  [id number] DATE OF BIRTH:  27-Jan-1961  DATE OF ADMISSION:  04/18/2011  PRIMARY CARE PHYSICIAN: Dr. Lamonte Sakai  HISTORY OF PRESENT ILLNESS:  The patient is a 54 year old Caucasian female with past medical history significant for history of asthma who presented to the hospital with complaints of shortness of breath as well as a cough.  According to the patient she was doing well up until approximately yesterday when she started having worsening wheezing as well as shortness of breath and coughing. It progressively got worse. On arrival to the Emergency Room, the patient's temperature was noted to be above 102 and she was also having chills. She also admitted having some diarrhea yesterday and  today. She has been coughing white phlegm, however denied any sick contacts. Because of that discomfort, she decided to come to the Emergency Room where she was noted to have oxygen saturations of 92% on room air and hospitalist services were contacted for admission.   PAST MEDICAL HISTORY:  1. Admission for pneumonia in May 2012.  2. Asthma. 3. Diabetes mellitus since 11/2009.   4. History of hypertension.   MEDICATIONS:  1. Allegra once daily. 2. Advair Diskus 250/50 twice daily.  3. Metformin 500 mg twice daily.  4. Hydrochlorothiazide losartan 12.5/50 mg once daily.   FAMILY HISTORY: The patient's father had diabetes. The patient's mother had hypertension.   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 Nonsmoker. No alcohol. She denies any drug abuse.   REVIEW OF SYSTEMS: Positive for cough, fevers, chills, shortness of breath, and wheezing, also fatigue and weak since yesterday. Pains in her shoulders as well as her legs in the thighs. Feeling dizzy, especially with coughing episodes and getting up from the chair, and arrhythmia with cough episodes. Some blood in her nasal secretions. Otherwise, denies any weight loss or gain.  EYES: In regards to  eyes, denies any blurred vision, double vision, glaucoma, or cataracts.  ENT: Denies any tinnitus, allergies, epistaxis, sinus pain, dentures, or difficulty swallowing. RESPIRATORY:  Denies any hemoptysis, painful respirations, or chronic obstructive pulmonary disease.  CARDIOVASCULAR: Denies any chest pains, orthopnea, edema, palpitations, or syncope. GASTROINTESTINAL: Denies nausea, vomiting, hematemesis, rectal bleeding, or change in bowel habits. GENITOURINARY: Denies any dysuria, hematuria, frequency, or incontinence. ENDOCRINOLOGIC: Denies any polydipsia, nocturia, thyroid problems, heat or cold intolerance, or thirst. HEMATOLOGIC: Denies anemia, easy bruising, bleeding, or swollen glands. SKIN: Denies any acne, rashes, lesions, or change in moles. MUSCULOSKELETAL: Denies arthritis, cramps, swelling, or gout. The patient admits to left heel pain when she stands for a prolonged period of time or walks. NEUROLOGIC: No numbness, epilepsy, or tremor.  PSYCH: No anxiety, insomnia, or depression.   PHYSICAL EXAMINATION:  VITAL SIGNS: On arrival to the hospital, the patient's temperature was 102.2, pulse 110, respirations 28, blood pressure 138/75, saturation 92% on room air.   GENERAL: This is an obese Caucasian female in moderate distress secondary to coughing and wheezing as well as shortness of breath.   HEENT: Pupils equal and reactive to light. Extraocular movements intact. No icterus or conjunctivitis. Has normal hearing. No pharyngeal erythema. Mucosa is moist.   NECK: Neck did not reveal any masses. Supple, nontender. Thyroid not enlarged. No adenopathy. No JVD or carotid bruits bilaterally. Full range of motion.   LUNGS: Markedly diminished breath sounds as well as wheezing, inspiratory as well as expiratory, prolonged expiratory phase, labored inspirations as well as increased effort to breathe, in mild respiratory distress  especially when she moves around even in the bed or talks.    CARDIOVASCULAR: S1, S2 appreciated. No murmurs, gallops, or rubs noted. Distant.  PMI not lateralized. Chest is nontender to palpation. 1+ pedal pulses. No lower extremity edema, calf tenderness, or cyanosis noted.   ABDOMEN: Soft and nontender. Bowel sounds are present. No hepatosplenomegaly or masses were noted.   RECTAL: Deferred.   MUSCULOSKELETAL: Able to move all extremities. No cyanosis, degenerative joint disease, or kyphosis. Gait is not tested.   SKIN: Skin did not reveal any rashes, lesions, erythema, or induration. It was warm and dry to palpation.   LYMPH: No adenopathy in the cervical region.   NEUROLOGIC: Cranial nerves grossly intact. Sensory is intact. No dysarthria or aphasia.   PSYCH: The patient is alert, oriented to time, person, and place, cooperative. Memory is good. No significant confusion, agitation, or depression noted.   LABORATORY DATA:  The patient's EKG showed sinus tach at 105 beats per minute, normal axis, possible left atrial enlargement, no acute ST-T changes were noted. Chest x-ray revealed right middle lobe atelectasis as well as reactive airway disease, PA and lateral.  Influenza A and B antigens were negative. Basic metabolic panel showed glucose 120, otherwise unremarkable. Liver enzymes showed AST elevation to 41. Cardiac enzymes- first set negative. CBC within normal limits.   ASSESSMENT AND PLAN:  1. Systemic inflammatory  response reaction with fever, tachypnea, and tachycardia: Admit the patient to the medical floor. Blood cultures were taken.  The patient will be started on an antibiotic with Rocephin and  Zithromax IV for possible bronchitis versus mild pneumonia in the  right middle lobe. We will get sputum cultures and adjust antibiotics according to culture results. Asthma exacerbation:  Continue the patient on inhalation therapy as well as steroids. Get pulmonologist involved because the patient tells me that she gets exacerbations every few  months and, in fact, she has wheezing even in between exacerbations.  I think that the patient's medications need to be adjusted and she needs to be closely followed.  2. Acute bronchitis: Continue antibiotics. As mentioned above, we will get sputum cultures.  3. Diabetes mellitus: We will continue metformin as well as sliding scale insulin. 4. Hypertension: Continue losartan as well as hydrochlorothiazide.  5. Elevated transaminases: Of unclear etiology at this time, possibly related to fatty liver disease. However, the patient's AST was normal in the past so we will follow the patient's transaminases in the morning.  6. Obesity: We will check the patient's hemoglobin A1c again and her TSH as well as lipid panel.   TIME SPENT:  One hour.   ____________________________ Theodoro Grist, MD rv:bjt D: 04/18/2011 22:45:39 ET T: 04/19/2011 07:32:48 ET JOB#: [id number]  cc: Theodoro Grist, MD, <Dictator> Perrin Maltese, MD Theodoro Grist MD ELECTRONICALLY SIGNED 04/19/2011 14:33

## 2014-07-08 ENCOUNTER — Emergency Department: Payer: Managed Care, Other (non HMO)

## 2014-07-08 ENCOUNTER — Emergency Department
Admission: EM | Admit: 2014-07-08 | Discharge: 2014-07-08 | Disposition: A | Discharge status: Home / Self Care | Payer: Managed Care, Other (non HMO) | Attending: Emergency Medicine | Admitting: Emergency Medicine

## 2014-07-08 ENCOUNTER — Encounter: Payer: Self-pay | Admitting: Emergency Medicine

## 2014-07-08 ENCOUNTER — Other Ambulatory Visit: Payer: Self-pay

## 2014-07-08 DIAGNOSIS — J45901 Unspecified asthma with (acute) exacerbation: Secondary | ICD-10-CM | POA: Diagnosis not present

## 2014-07-08 DIAGNOSIS — I10 Essential (primary) hypertension: Secondary | ICD-10-CM | POA: Diagnosis not present

## 2014-07-08 DIAGNOSIS — E119 Type 2 diabetes mellitus without complications: Secondary | ICD-10-CM | POA: Insufficient documentation

## 2014-07-08 DIAGNOSIS — Z87891 Personal history of nicotine dependence: Secondary | ICD-10-CM | POA: Diagnosis not present

## 2014-07-08 DIAGNOSIS — R0602 Shortness of breath: Secondary | ICD-10-CM | POA: Diagnosis present

## 2014-07-08 HISTORY — DX: Type 2 diabetes mellitus without complications: E11.9

## 2014-07-08 HISTORY — DX: Essential (primary) hypertension: I10

## 2014-07-08 HISTORY — DX: Unspecified asthma, uncomplicated: J45.909

## 2014-07-08 HISTORY — DX: Obesity, unspecified: E66.9

## 2014-07-08 MED ORDER — IPRATROPIUM-ALBUTEROL 0.5-2.5 (3) MG/3ML IN SOLN
3.0000 mL | Freq: Once | RESPIRATORY_TRACT | Status: AC
  Administered 2014-07-08: 3 mL via RESPIRATORY_TRACT

## 2014-07-08 MED ORDER — ALBUTEROL SULFATE (2.5 MG/3ML) 0.083% IN NEBU
INHALATION_SOLUTION | RESPIRATORY_TRACT | Status: AC
  Administered 2014-07-08: 2.5 mg
  Filled 2014-07-08: qty 3

## 2014-07-08 MED ORDER — IPRATROPIUM-ALBUTEROL 0.5-2.5 (3) MG/3ML IN SOLN
RESPIRATORY_TRACT | Status: AC
  Administered 2014-07-08: 3 mL via RESPIRATORY_TRACT
  Filled 2014-07-08: qty 9

## 2014-07-08 MED ORDER — PREDNISONE 20 MG PO TABS
ORAL_TABLET | ORAL | Status: AC
  Administered 2014-07-08: 60 mg via ORAL
  Filled 2014-07-08: qty 3

## 2014-07-08 MED ORDER — ALBUTEROL SULFATE (2.5 MG/3ML) 0.083% IN NEBU: 2.5000 mg | INHALATION_SOLUTION | Freq: Once | RESPIRATORY_TRACT | Status: AC

## 2014-07-08 MED ORDER — PREDNISONE 20 MG PO TABS
60.0000 mg | ORAL_TABLET | Freq: Every day | ORAL | Status: DC
Start: 2014-07-08 — End: 2014-09-30

## 2014-07-08 MED ORDER — PREDNISONE 20 MG PO TABS
60.0000 mg | ORAL_TABLET | ORAL | Status: AC
  Administered 2014-07-08: 60 mg via ORAL

## 2014-07-08 MED ORDER — CETIRIZINE HCL 10 MG PO TABS: 10.0000 mg | ORAL_TABLET | Freq: Every day | ORAL | Status: DC

## 2014-07-08 MED ORDER — ALBUTEROL SULFATE HFA 108 (90 BASE) MCG/ACT IN AERS: INHALATION_SPRAY | RESPIRATORY_TRACT | Status: DC

## 2014-07-08 NOTE — ED Notes (Signed)
Pt reports that she has asthma and has been having trouble breathing for the last several days. She has been taking albuterol and is not helping. She went to her PMD and they gave her prednisone, antibiotics and OTC Musinex it got better than a week later flared up again. Pt has a cough that is non-productive.

## 2014-07-08 NOTE — Discharge Instructions (Signed)
We believe that your symptoms are caused today by an exacerbation of your asthma.  Please take the prescribed medications and any medications that you have at home.  Follow up with your doctor as recommended.  If you develop any new or worsening symptoms, including but not limited to fever, persistent vomiting, worsening shortness of breath, or other symptoms that concern you, please return to the Emergency Department immediately.   Asthma Asthma is a recurring condition in which the airways tighten and narrow. Asthma can make it difficult to breathe. It can cause coughing, wheezing, and shortness of breath. Asthma episodes, also called asthma attacks, range from minor to life-threatening. Asthma cannot be cured, but medicines and lifestyle changes can help control it. CAUSES Asthma is believed to be caused by inherited (genetic) and environmental factors, but its exact cause is unknown. Asthma may be triggered by allergens, lung infections, or irritants in the air. Asthma triggers are different for each person. Common triggers include:   Animal dander.  Dust mites.  Cockroaches.  Pollen from trees or grass.  Mold.  Smoke.  Air pollutants such as dust, household cleaners, hair sprays, aerosol sprays, paint fumes, strong chemicals, or strong odors.  Cold air, weather changes, and winds (which increase molds and pollens in the air).  Strong emotional expressions such as crying or laughing hard.  Stress.  Certain medicines (such as aspirin) or types of drugs (such as beta-blockers).  Sulfites in foods and drinks. Foods and drinks that may contain sulfites include dried fruit, potato chips, and sparkling grape juice.  Infections or inflammatory conditions such as the flu, a cold, or an inflammation of the nasal membranes (rhinitis).  Gastroesophageal reflux disease (GERD).  Exercise or strenuous activity. SYMPTOMS Symptoms may occur immediately after asthma is triggered or many  hours later. Symptoms include:  Wheezing.  Excessive nighttime or early morning coughing.  Frequent or severe coughing with a common cold.  Chest tightness.  Shortness of breath. DIAGNOSIS  The diagnosis of asthma is made by a review of your medical history and a physical exam. Tests may also be performed. These may include:  Lung function studies. These tests show how much air you breathe in and out.  Allergy tests.  Imaging tests such as X-rays. TREATMENT  Asthma cannot be cured, but it can usually be controlled. Treatment involves identifying and avoiding your asthma triggers. It also involves medicines. There are 2 classes of medicine used for asthma treatment:   Controller medicines. These prevent asthma symptoms from occurring. They are usually taken every day.  Reliever or rescue medicines. These quickly relieve asthma symptoms. They are used as needed and provide short-term relief. Your health care provider will help you create an asthma action plan. An asthma action plan is a written plan for managing and treating your asthma attacks. It includes a list of your asthma triggers and how they may be avoided. It also includes information on when medicines should be taken and when their dosage should be changed. An action plan may also involve the use of a device called a peak flow meter. A peak flow meter measures how well the lungs are working. It helps you monitor your condition. HOME CARE INSTRUCTIONS   Take medicines only as directed by your health care provider. Speak with your health care provider if you have questions about how or when to take the medicines.  Use a peak flow meter as directed by your health care provider. Record and keep track of readings.  Understand and use the action plan to help minimize or stop an asthma attack without needing to seek medical care.  Control your home environment in the following ways to help prevent asthma attacks:  Do not smoke.  Avoid being exposed to secondhand smoke.  Change your heating and air conditioning filter regularly.  Limit your use of fireplaces and wood stoves.  Get rid of pests (such as roaches and mice) and their droppings.  Throw away plants if you see mold on them.  Clean your floors and dust regularly. Use unscented cleaning products.  Try to have someone else vacuum for you regularly. Stay out of rooms while they are being vacuumed and for a short while afterward. If you vacuum, use a dust mask from a hardware store, a double-layered or microfilter vacuum cleaner bag, or a vacuum cleaner with a HEPA filter.  Replace carpet with wood, tile, or vinyl flooring. Carpet can trap dander and dust.  Use allergy-proof pillows, mattress covers, and box spring covers.  Wash bed sheets and blankets every week in hot water and dry them in a dryer.  Use blankets that are made of polyester or cotton.  Clean bathrooms and kitchens with bleach. If possible, have someone repaint the walls in these rooms with mold-resistant paint. Keep out of the rooms that are being cleaned and painted.  Wash hands frequently. SEEK MEDICAL CARE IF:   You have wheezing, shortness of breath, or a cough even if taking medicine to prevent attacks.  The colored mucus you cough up (sputum) is thicker than usual.  Your sputum changes from clear or white to yellow, green, gray, or bloody.  You have any problems that may be related to the medicines you are taking (such as a rash, itching, swelling, or trouble breathing).  You are using a reliever medicine more than 2-3 times per week.  Your peak flow is still at 50-79% of your personal best after following your action plan for 1 hour.  You have a fever. SEEK IMMEDIATE MEDICAL CARE IF:   You seem to be getting worse and are unresponsive to treatment during an asthma attack.  You are short of breath even at rest.  You get short of breath when doing very little physical  activity.  You have difficulty eating, drinking, or talking due to asthma symptoms.  You develop chest pain.  You develop a fast heartbeat.  You have a bluish color to your lips or fingernails.  You are light-headed, dizzy, or faint.  Your peak flow is less than 50% of your personal best. MAKE SURE YOU:   Understand these instructions.  Will watch your condition.  Will get help right away if you are not doing well or get worse. Document Released: 02/06/2005 Document Revised: 06/23/2013 Document Reviewed: 09/05/2012 [facility] Patient Information 2015 [place], [place]. This information is not intended to replace advice given to you by your health care provider. Make sure you discuss any questions you have with your health care provider.

## 2014-07-08 NOTE — ED Provider Notes (Signed)
[facility] Emergency Department Provider Note  ____________________________________________  Time seen: Approximately 3:45 PM  I have reviewed the triage vital signs and the nursing notes.   HISTORY  Chief Complaint Shortness of Breath    HPI Nancy Patton is a 54 y.o. female with a history of asthma, 2nd hand smoke exposure, and obesity who presents with several days of wheezing and difficulty breathing similar to prior asthma exacerbations.  She has been using her regular nebulizers and uses an inhaler (2 puffs twice a day).  She was treated about 2 weeks ago by her primary care doctor with a short course of prednisone and a course of azithromycin.  She was better for about 2 weeks, but symptoms started back up again.  She describes her symptoms as moderate to severeand worse with exertion.  She also has seasonal allergies that have been bad this year.   Past Medical History  Diagnosis Date  . Asthma   . Hypertension   . Diabetes mellitus without complication   . Obesity     There are no active problems to display for this patient.   History reviewed. No pertinent past surgical history.  Current Outpatient Rx  Name  Route  Sig  Dispense  Refill  . albuterol (PROVENTIL HFA;VENTOLIN HFA) 108 (90 BASE) MCG/ACT inhaler      Inhale 4-6 puffs by mouth every 4 hours as needed for wheezing, cough, and/or shortness of breath   1 Inhaler   1   . cetirizine (ZYRTEC) 10 MG tablet   Oral   Take 1 tablet (10 mg total) by mouth daily.   30 tablet   0   . predniSONE (DELTASONE) 20 MG tablet   Oral   Take 3 tablets (60 mg total) by mouth daily.   15 tablet   0     Allergies Review of patient's allergies indicates no known allergies.  History reviewed. No pertinent family history.  Social History History  Substance Use Topics  . Smoking status: Former Research scientist (life sciences)  . Smokeless tobacco: Not on file  . Alcohol Use: No     Comment: 2nd hand smoke     Review of Systems Constitutional: No fever/chills Eyes: No visual changes. ENT: No sore throat. Cardiovascular: Denies chest pain. Respiratory: shortness of breath and wheezing as described above Gastrointestinal: No abdominal pain.  No nausea, no vomiting.  No diarrhea.  No constipation. Genitourinary: Negative for dysuria. Musculoskeletal: Negative for back pain. Skin: Negative for rash. Neurological: Negative for headaches, focal weakness or numbness.  10-point ROS otherwise negative.  ____________________________________________   PHYSICAL EXAM:  VITAL SIGNS: ED Triage Vitals  Enc Vitals Group     BP 07/08/14 1328 130/81 mmHg     Pulse Rate 07/08/14 1328 96     Resp 07/08/14 1328 22     Temp 07/08/14 1328 98.6 F (37 C)     Temp Source 07/08/14 1328 Oral     SpO2 07/08/14 1328 92 %     Weight 07/08/14 1328 270 lb (122.471 kg)     Height 07/08/14 1328 5\' 7"  (1.702 m)     Head Cir --      Peak Flow --      Pain Score 07/08/14 1337 6     Pain Loc --      Pain Edu? --      Excl. in [place]? --     Constitutional: Alert and oriented. Well appearing and in mild respiratory distress. Eyes: Conjunctivae  are normal. PERRL. EOMI. Head: Atraumatic. Nose: No congestion/rhinnorhea. Mouth/Throat: Mucous membranes are moist.  Oropharynx non-erythematous. Neck: No stridor.   Cardiovascular: Normal rate, regular rhythm. Grossly normal heart sounds.  Good peripheral circulation. Respiratory: Slightly increased work of breathing but no clear retractions.  Expiratory wheezes throughout with rhonchi. Gastrointestinal: Soft and nontender. No distention. No abdominal bruits. No CVA tenderness. Musculoskeletal: No lower extremity tenderness nor edema.  No joint effusions. Neurologic:  Normal speech and language. No gross focal neurologic deficits are appreciated. Speech is normal. No gait instability. Skin:  Skin is warm, dry and intact. No rash noted. Psychiatric: Mood and affect are  normal. Speech and behavior are normal.  ____________________________________________   LABS (all labs ordered are listed, but only abnormal results are displayed)  Labs Reviewed - No data to display ____________________________________________  EKG   Date: 07/08/2014  Rate: 96  Rhythm: normal sinus rhythm  QRS Axis: normal  Intervals: normal  ST/T Wave abnormalities: normal  Conduction Disutrbances: none  Narrative Interpretation: unremarkable     ____________________________________________  RADIOLOGY  Dg Chest 2 View  07/08/2014   CLINICAL DATA:  54 year old female with shortness of breath and right-sided back pain  EXAM: CHEST  2 VIEW  COMPARISON:  None.  FINDINGS: The lungs are clear and negative for focal airspace consolidation, pulmonary edema or suspicious pulmonary nodule. No pleural effusion or pneumothorax. Cardiac and mediastinal contours are within normal limits. No acute fracture or lytic or blastic osseous lesions. The visualized upper abdominal bowel gas pattern is unremarkable.  IMPRESSION: No active cardiopulmonary disease.   Electronically Signed   By: Jacqulynn Cadet M.D.   On: 07/08/2014 16:04    ____________________________________________   PROCEDURES  Procedure(s) performed: None  Critical Care performed: No  ____________________________________________   INITIAL IMPRESSION / ASSESSMENT AND PLAN / ED COURSE  Pertinent labs & imaging results that were available during my care of the patient were reviewed by me and considered in my medical decision making (see chart for details).  Signs and symptoms suggest asthma flare without pneumonia.  However given her symptoms are worsening after recent treatment I will obtain a two-view chest x-ray.  Her vital signs are stable.  We will give her 3 DuoNeb treatments and a dose of prednisone in the emergency department and reassess.  ----------------------------------------- 5:11 PM on  07/08/2014 -----------------------------------------  After the 3 DuoNeb labs and an additional albuterol treatment, the patient sounds and feels much better.  She has no sign of pneumonia.  I gave her a dose of prednisone 60 mg in the emergency department and a prescription for a 5 day burst of steroids.  I also gave her another prescription for albuterol inhaler and recommended 4-6 puffs by mouth every 4 hours instead of the 2 puffs twice a day.  I also recommended that she take cetirizine given her seasonal allergies and the contribution that her allergies may play to her asthma exacerbation.  I gave her my usual and customary return precautions and she will follow up with her primary care doctor next week. ____________________________________________   FINAL CLINICAL IMPRESSION(S) / ED DIAGNOSES  Final diagnoses:  Asthma exacerbation     Hinda Kehr, MD 07/08/14 1712

## 2014-09-26 ENCOUNTER — Inpatient Hospital Stay
Admission: EM | Admit: 2014-09-26 | Discharge: 2014-09-30 | DRG: 190 | Disposition: A | Discharge status: Home / Self Care | Payer: Managed Care, Other (non HMO) | Attending: Internal Medicine | Admitting: Internal Medicine

## 2014-09-26 ENCOUNTER — Encounter: Payer: Self-pay | Admitting: *Deleted

## 2014-09-26 ENCOUNTER — Emergency Department: Payer: Managed Care, Other (non HMO)

## 2014-09-26 DIAGNOSIS — I10 Essential (primary) hypertension: Secondary | ICD-10-CM | POA: Diagnosis present

## 2014-09-26 DIAGNOSIS — J301 Allergic rhinitis due to pollen: Secondary | ICD-10-CM | POA: Diagnosis present

## 2014-09-26 DIAGNOSIS — Z87891 Personal history of nicotine dependence: Secondary | ICD-10-CM

## 2014-09-26 DIAGNOSIS — R0902 Hypoxemia: Secondary | ICD-10-CM

## 2014-09-26 DIAGNOSIS — Z6841 Body Mass Index (BMI) 40.0 and over, adult: Secondary | ICD-10-CM | POA: Diagnosis not present

## 2014-09-26 DIAGNOSIS — J441 Chronic obstructive pulmonary disease with (acute) exacerbation: Secondary | ICD-10-CM | POA: Diagnosis present

## 2014-09-26 DIAGNOSIS — E119 Type 2 diabetes mellitus without complications: Secondary | ICD-10-CM | POA: Diagnosis present

## 2014-09-26 DIAGNOSIS — F419 Anxiety disorder, unspecified: Secondary | ICD-10-CM | POA: Diagnosis present

## 2014-09-26 DIAGNOSIS — Z9119 Patient's noncompliance with other medical treatment and regimen: Secondary | ICD-10-CM | POA: Diagnosis present

## 2014-09-26 DIAGNOSIS — J45901 Unspecified asthma with (acute) exacerbation: Secondary | ICD-10-CM | POA: Diagnosis present

## 2014-09-26 DIAGNOSIS — R0603 Acute respiratory distress: Secondary | ICD-10-CM

## 2014-09-26 DIAGNOSIS — J4551 Severe persistent asthma with (acute) exacerbation: Secondary | ICD-10-CM | POA: Diagnosis present

## 2014-09-26 DIAGNOSIS — G4733 Obstructive sleep apnea (adult) (pediatric): Secondary | ICD-10-CM | POA: Diagnosis present

## 2014-09-26 DIAGNOSIS — J440 Chronic obstructive pulmonary disease with acute lower respiratory infection: Principal | ICD-10-CM | POA: Diagnosis present

## 2014-09-26 DIAGNOSIS — I272 Other secondary pulmonary hypertension: Secondary | ICD-10-CM | POA: Diagnosis present

## 2014-09-26 DIAGNOSIS — J209 Acute bronchitis, unspecified: Secondary | ICD-10-CM | POA: Diagnosis present

## 2014-09-26 DIAGNOSIS — F329 Major depressive disorder, single episode, unspecified: Secondary | ICD-10-CM | POA: Diagnosis present

## 2014-09-26 DIAGNOSIS — J9601 Acute respiratory failure with hypoxia: Secondary | ICD-10-CM | POA: Diagnosis present

## 2014-09-26 HISTORY — DX: Pulmonary hypertension, unspecified: I27.20

## 2014-09-26 HISTORY — DX: Unspecified asthma with (acute) exacerbation: J45.901

## 2014-09-26 HISTORY — DX: Acute respiratory distress: R06.03

## 2014-09-26 LAB — CBC WITH DIFFERENTIAL/PLATELET
Basophils Absolute: 0 10*3/uL (ref 0–0.1)
Basophils Relative: 1 %
Eosinophils Absolute: 1.1 10*3/uL — ABNORMAL HIGH (ref 0–0.7)
Eosinophils Relative: 20 %
HCT: 35.9 % (ref 35.0–47.0)
Hemoglobin: 12.1 g/dL (ref 12.0–16.0)
Lymphocytes Relative: 27 %
Lymphs Abs: 1.4 10*3/uL (ref 1.0–3.6)
MCH: 30.1 pg (ref 26.0–34.0)
MCHC: 33.6 g/dL (ref 32.0–36.0)
MCV: 89.5 fL (ref 80.0–100.0)
Monocytes Absolute: 0.4 10*3/uL (ref 0.2–0.9)
Monocytes Relative: 8 %
Neutro Abs: 2.4 10*3/uL (ref 1.4–6.5)
Neutrophils Relative %: 44 %
Platelets: 185 10*3/uL (ref 150–440)
RBC: 4.01 MIL/uL (ref 3.80–5.20)
RDW: 13.9 % (ref 11.5–14.5)
WBC: 5.3 10*3/uL (ref 3.6–11.0)

## 2014-09-26 LAB — BASIC METABOLIC PANEL
Anion gap: 8 (ref 5–15)
BUN: 11 mg/dL (ref 6–20)
CO2: 26 mmol/L (ref 22–32)
Calcium: 8.7 mg/dL — ABNORMAL LOW (ref 8.9–10.3)
Chloride: 108 mmol/L (ref 101–111)
Creatinine, Ser: 0.75 mg/dL (ref 0.44–1.00)
GFR calc Af Amer: >60 mL/min (ref >60)
GFR calc non Af Amer: >60 mL/min (ref >60)
Glucose, Bld: 182 mg/dL — ABNORMAL HIGH (ref 65–99)
Potassium: 3.3 mmol/L — ABNORMAL LOW (ref 3.5–5.1)
Sodium: 142 mmol/L (ref 135–145)

## 2014-09-26 LAB — GLUCOSE, CAPILLARY
Glucose-Capillary: 398 mg/dL — ABNORMAL HIGH (ref 65–99)
Glucose-Capillary: 430 mg/dL — ABNORMAL HIGH (ref 65–99)
Glucose-Capillary: 276 mg/dL — ABNORMAL HIGH (ref 65–99)

## 2014-09-26 LAB — BRAIN NATRIURETIC PEPTIDE: B Natriuretic Peptide: 22 pg/mL (ref 0.0–100.0)

## 2014-09-26 LAB — TROPONIN I: Troponin I: <0.03 ng/mL (ref <0.031)

## 2014-09-26 LAB — HEMOGLOBIN A1C: Hgb A1c MFr Bld: 6.4 % — ABNORMAL HIGH (ref 4.0–6.0)

## 2014-09-26 MED ORDER — ACETAMINOPHEN 325 MG PO TABS: 650.0000 mg | ORAL_TABLET | Freq: Four times a day (QID) | ORAL | Status: DC | PRN

## 2014-09-26 MED ORDER — IPRATROPIUM-ALBUTEROL 0.5-2.5 (3) MG/3ML IN SOLN
3.0000 mL | Freq: Once | RESPIRATORY_TRACT | Status: AC
  Administered 2014-09-26: 3 mL via RESPIRATORY_TRACT
  Filled 2014-09-26: qty 3

## 2014-09-26 MED ORDER — DEXTROSE 5 % IV SOLN
500.0000 mg | INTRAVENOUS | Status: DC
  Administered 2014-09-26 – 2014-09-28 (×3): 500 mg via INTRAVENOUS
  Filled 2014-09-26 (×4): qty 500

## 2014-09-26 MED ORDER — POTASSIUM CHLORIDE IN NACL 20-0.9 MEQ/L-% IV SOLN
INTRAVENOUS | Status: DC
  Administered 2014-09-26 – 2014-09-29 (×5): via INTRAVENOUS
  Filled 2014-09-26 (×11): qty 1000

## 2014-09-26 MED ORDER — LORATADINE 10 MG PO TABS
10.0000 mg | ORAL_TABLET | Freq: Every day | ORAL | Status: DC
  Administered 2014-09-26 – 2014-09-30 (×5): 10 mg via ORAL
  Filled 2014-09-26 (×5): qty 1

## 2014-09-26 MED ORDER — MOMETASONE FURO-FORMOTEROL FUM 100-5 MCG/ACT IN AERO
2.0000 | INHALATION_SPRAY | Freq: Two times a day (BID) | RESPIRATORY_TRACT | Status: DC
  Administered 2014-09-26 – 2014-09-30 (×9): 2 via RESPIRATORY_TRACT
  Filled 2014-09-26: qty 8.8

## 2014-09-26 MED ORDER — DEXTROSE 5 % IV SOLN
1.0000 g | INTRAVENOUS | Status: DC
  Administered 2014-09-26 – 2014-09-29 (×4): 1 g via INTRAVENOUS
  Filled 2014-09-26 (×5): qty 10

## 2014-09-26 MED ORDER — ALBUTEROL SULFATE (2.5 MG/3ML) 0.083% IN NEBU
10.0000 mg | INHALATION_SOLUTION | Freq: Once | RESPIRATORY_TRACT | Status: AC
  Administered 2014-09-26: 10 mg via RESPIRATORY_TRACT
  Filled 2014-09-26: qty 12

## 2014-09-26 MED ORDER — LOSARTAN POTASSIUM 50 MG PO TABS
50.0000 mg | ORAL_TABLET | Freq: Every day | ORAL | Status: DC
  Administered 2014-09-26 – 2014-09-30 (×5): 50 mg via ORAL
  Filled 2014-09-26 (×5): qty 1

## 2014-09-26 MED ORDER — INSULIN ASPART 100 UNIT/ML ~~LOC~~ SOLN
14.0000 [IU] | Freq: Once | SUBCUTANEOUS | Status: AC
  Administered 2014-09-26: 14 [IU] via SUBCUTANEOUS
  Filled 2014-09-26: qty 14

## 2014-09-26 MED ORDER — HEPARIN SODIUM (PORCINE) 5000 UNIT/ML IJ SOLN
5000.0000 [IU] | Freq: Three times a day (TID) | INTRAMUSCULAR | Status: DC
  Administered 2014-09-26 – 2014-09-30 (×12): 5000 [IU] via SUBCUTANEOUS
  Filled 2014-09-26 (×12): qty 1

## 2014-09-26 MED ORDER — ALBUTEROL SULFATE (2.5 MG/3ML) 0.083% IN NEBU
5.0000 mg | INHALATION_SOLUTION | Freq: Once | RESPIRATORY_TRACT | Status: AC
Start: 2014-09-26 — End: 2014-09-26
  Administered 2014-09-26: 5 mg via RESPIRATORY_TRACT
  Filled 2014-09-26: qty 6

## 2014-09-26 MED ORDER — IPRATROPIUM-ALBUTEROL 0.5-2.5 (3) MG/3ML IN SOLN
3.0000 mL | RESPIRATORY_TRACT | Status: DC
  Administered 2014-09-26 – 2014-09-30 (×21): 3 mL via RESPIRATORY_TRACT
  Filled 2014-09-26 (×21): qty 3

## 2014-09-26 MED ORDER — POTASSIUM CHLORIDE 20 MEQ PO PACK
20.0000 meq | PACK | Freq: Once | ORAL | Status: AC
  Administered 2014-09-26: 20 meq via ORAL
  Filled 2014-09-26: qty 1

## 2014-09-26 MED ORDER — TIOTROPIUM BROMIDE MONOHYDRATE 18 MCG IN CAPS
18.0000 ug | ORAL_CAPSULE | Freq: Every day | RESPIRATORY_TRACT | Status: DC
  Administered 2014-09-26 – 2014-09-30 (×5): 18 ug via RESPIRATORY_TRACT
  Filled 2014-09-26: qty 5

## 2014-09-26 MED ORDER — METFORMIN HCL 500 MG PO TABS
1000.0000 mg | ORAL_TABLET | Freq: Two times a day (BID) | ORAL | Status: DC
  Administered 2014-09-26 – 2014-09-30 (×8): 1000 mg via ORAL
  Filled 2014-09-26 (×8): qty 2

## 2014-09-26 MED ORDER — METHYLPREDNISOLONE SODIUM SUCC 125 MG IJ SOLR
60.0000 mg | Freq: Four times a day (QID) | INTRAMUSCULAR | Status: DC
  Administered 2014-09-26 – 2014-09-28 (×8): 60 mg via INTRAVENOUS
  Filled 2014-09-26 (×9): qty 2

## 2014-09-26 MED ORDER — INSULIN ASPART 100 UNIT/ML ~~LOC~~ SOLN: 0.0000 [IU] | Freq: Three times a day (TID) | SUBCUTANEOUS | Status: DC

## 2014-09-26 MED ORDER — HYDROCODONE-ACETAMINOPHEN 5-325 MG PO TABS: 1.0000 | ORAL_TABLET | ORAL | Status: DC | PRN

## 2014-09-26 MED ORDER — ONDANSETRON HCL 4 MG/2ML IJ SOLN: 4.0000 mg | Freq: Four times a day (QID) | INTRAMUSCULAR | Status: DC | PRN

## 2014-09-26 MED ORDER — DEXAMETHASONE SODIUM PHOSPHATE 10 MG/ML IJ SOLN
10.0000 mg | Freq: Once | INTRAMUSCULAR | Status: AC
  Administered 2014-09-26: 10 mg via INTRAMUSCULAR
  Filled 2014-09-26: qty 1

## 2014-09-26 MED ORDER — ALBUTEROL SULFATE (2.5 MG/3ML) 0.083% IN NEBU: 2.5000 mg | INHALATION_SOLUTION | RESPIRATORY_TRACT | Status: DC | PRN

## 2014-09-26 MED ORDER — ACETAMINOPHEN 650 MG RE SUPP: 650.0000 mg | Freq: Four times a day (QID) | RECTAL | Status: DC | PRN

## 2014-09-26 MED ORDER — PANTOPRAZOLE SODIUM 40 MG PO TBEC
40.0000 mg | DELAYED_RELEASE_TABLET | Freq: Every day | ORAL | Status: DC
  Administered 2014-09-26 – 2014-09-30 (×5): 40 mg via ORAL
  Filled 2014-09-26 (×5): qty 1

## 2014-09-26 MED ORDER — MONTELUKAST SODIUM 10 MG PO TABS
10.0000 mg | ORAL_TABLET | Freq: Every day | ORAL | Status: DC
Start: 2014-09-26 — End: 2014-09-30
  Administered 2014-09-26 – 2014-09-30 (×5): 10 mg via ORAL
  Filled 2014-09-26 (×5): qty 1

## 2014-09-26 MED ORDER — HYDROCHLOROTHIAZIDE 12.5 MG PO CAPS
12.5000 mg | ORAL_CAPSULE | Freq: Every day | ORAL | Status: DC
  Administered 2014-09-26 – 2014-09-30 (×5): 12.5 mg via ORAL
  Filled 2014-09-26 (×5): qty 1

## 2014-09-26 MED ORDER — ONDANSETRON HCL 4 MG PO TABS: 4.0000 mg | ORAL_TABLET | Freq: Four times a day (QID) | ORAL | Status: DC | PRN

## 2014-09-26 MED ORDER — LORAZEPAM 1 MG PO TABS: 1.0000 mg | ORAL_TABLET | ORAL | Status: DC | PRN

## 2014-09-26 MED ORDER — INSULIN ASPART 100 UNIT/ML ~~LOC~~ SOLN
0.0000 [IU] | Freq: Three times a day (TID) | SUBCUTANEOUS | Status: DC
  Administered 2014-09-27: 3 [IU] via SUBCUTANEOUS
  Administered 2014-09-27: 5 [IU] via SUBCUTANEOUS
  Administered 2014-09-27: 8 [IU] via SUBCUTANEOUS
  Administered 2014-09-28: 11 [IU] via SUBCUTANEOUS
  Administered 2014-09-28: 5 [IU] via SUBCUTANEOUS
  Administered 2014-09-28: 8 [IU] via SUBCUTANEOUS
  Administered 2014-09-29 (×2): 5 [IU] via SUBCUTANEOUS
  Administered 2014-09-29: 3 [IU] via SUBCUTANEOUS
  Administered 2014-09-30: 2 [IU] via SUBCUTANEOUS
  Filled 2014-09-26 (×2): qty 5
  Filled 2014-09-26: qty 3
  Filled 2014-09-26: qty 11
  Filled 2014-09-26: qty 3
  Filled 2014-09-26: qty 5
  Filled 2014-09-26 (×2): qty 8
  Filled 2014-09-26: qty 5
  Filled 2014-09-26: qty 2

## 2014-09-26 MED ORDER — CITALOPRAM HYDROBROMIDE 20 MG PO TABS
20.0000 mg | ORAL_TABLET | Freq: Every day | ORAL | Status: DC
  Administered 2014-09-26 – 2014-09-30 (×5): 20 mg via ORAL
  Filled 2014-09-26 (×5): qty 1

## 2014-09-26 MED ORDER — LOSARTAN POTASSIUM-HCTZ 50-12.5 MG PO TABS: 1.0000 | ORAL_TABLET | Freq: Every day | ORAL | Status: DC

## 2014-09-26 MED ORDER — ALBUTEROL SULFATE HFA 108 (90 BASE) MCG/ACT IN AERS: 2.0000 | INHALATION_SPRAY | RESPIRATORY_TRACT | Status: DC | PRN

## 2014-09-26 MED ORDER — GLIMEPIRIDE 2 MG PO TABS
2.0000 mg | ORAL_TABLET | Freq: Two times a day (BID) | ORAL | Status: DC
Start: 2014-09-26 — End: 2014-09-30
  Administered 2014-09-26 – 2014-09-30 (×8): 2 mg via ORAL
  Filled 2014-09-26 (×8): qty 1

## 2014-09-26 MED ORDER — DOCUSATE SODIUM 100 MG PO CAPS
100.0000 mg | ORAL_CAPSULE | Freq: Two times a day (BID) | ORAL | Status: DC
  Administered 2014-09-26 – 2014-09-30 (×9): 100 mg via ORAL
  Filled 2014-09-26 (×9): qty 1

## 2014-09-26 NOTE — ED Notes (Signed)
Sob x 1 week, pt states it has been worsening for over a week. Pt wheezing in triage. Pt able to talk in full sentences

## 2014-09-26 NOTE — ED Notes (Signed)
Breathing treatment started in triage

## 2014-09-26 NOTE — ED Notes (Signed)
conts to have bilateral wheezing after 2 svn treatments. o2 sats 94% but decreases with movement

## 2014-09-26 NOTE — ED Provider Notes (Signed)
EKG: Interpreted by me, normal sinus rhythm with normal axis, normal intervals. No evidence of hypertrophy or acute infarction. Rate is 85 bpm.  Earleen Newport, MD 09/26/14 1031

## 2014-09-26 NOTE — ED Provider Notes (Signed)
[facility] Emergency Department Provider Note  ____________________________________________  Time seen: Approximately 10:07 AM  I have reviewed the triage vital signs and the nursing notes.   HISTORY  Chief Complaint Shortness of Breath and Wheezing    HPI Nancy Patton is a 54 y.o. female complain of increased wheezing and shortness of breath for one week. Patient state her current maintenance medication is not helping and her rescue medicine is not relieving her wheezing. Patient denies any pain or URI signs symptoms this complaint.   Past Medical History  Diagnosis Date  . Asthma   . Hypertension   . Diabetes mellitus without complication   . Obesity   . Pulmonary hypertension     Patient Active Problem List   Diagnosis Date Noted  . Acute respiratory distress 09/26/2014  . Asthmatic bronchitis with acute exacerbation 09/26/2014  . Asthmatic bronchitis with exacerbation 09/26/2014    History reviewed. No pertinent past surgical history.  Current Outpatient Rx  Name  Route  Sig  Dispense  Refill  . albuterol (PROVENTIL HFA;VENTOLIN HFA) 108 (90 BASE) MCG/ACT inhaler   Inhalation   Inhale 2 puffs into the lungs every 6 (six) hours as needed for wheezing or shortness of breath.         Marland Kitchen BREO ELLIPTA 200-25 MCG/INH AEPB   Inhalation   Inhale 1 puff into the lungs daily.           Dispense as written.   . cetirizine (ZYRTEC) 10 MG tablet   Oral   Take 1 tablet (10 mg total) by mouth daily.   30 tablet   0   . citalopram (CELEXA) 20 MG tablet   Oral   Take 20 mg by mouth daily.      10   . glimepiride (AMARYL) 2 MG tablet   Oral   Take 2 mg by mouth 2 (two) times daily.      3   . levalbuterol (XOPENEX) 1.25 MG/3ML nebulizer solution   Nebulization   Take 3 mLs by nebulization 3 (three) times daily.      6   . losartan-hydrochlorothiazide (HYZAAR) 50-12.5 MG per tablet   Oral   Take 1 tablet by mouth daily.     5   . metFORMIN (GLUCOPHAGE) 1000 MG tablet   Oral   Take 1,000 mg by mouth 2 (two) times daily.      2   . montelukast (SINGULAIR) 10 MG tablet   Oral   Take 10 mg by mouth daily.         Marland Kitchen albuterol (PROVENTIL HFA;VENTOLIN HFA) 108 (90 BASE) MCG/ACT inhaler      Inhale 4-6 puffs by mouth every 4 hours as needed for wheezing, cough, and/or shortness of breath   1 Inhaler   1   . azithromycin (ZITHROMAX) 250 MG tablet   Oral   Take 1 tablet (250 mg total) by mouth daily.   5 each   0   . predniSONE (DELTASONE) 10 MG tablet   Oral   Take 6 tablets (60 mg total) by mouth daily with breakfast.   42 tablet   0     Start 60 mg once daily and taper 10 mg every other ...     Allergies Review of patient's allergies indicates no known allergies.  History reviewed. No pertinent family history.  Social History Social History  Substance Use Topics  . Smoking status: Former Research scientist (life sciences)  . Smokeless tobacco: None  .  Alcohol Use: No     Comment: 2nd hand smoke    Review of Systems Constitutional: No fever/chills Eyes: No visual changes. ENT: No sore throat. Cardiovascular: Denies chest pain. Respiratory illness of breath and wheezing.  Gastrointestinal: No abdominal pain.  No nausea, no vomiting.  No diarrhea.  No constipation. Genitourinary: Negative for dysuria. Musculoskeletal: Negative for back pain. Skin: Negative for rash. Neurological: Negative for headaches, focal weakness or numbness. Endocrine:Hypertension and diabetes. Hematological/Lymphatic: Allergic/Immunilogical:  10-point ROS otherwise negative.  ____________________________________________   PHYSICAL EXAM:  VITAL SIGNS: ED Triage Vitals  Enc Vitals Group     BP 09/26/14 0729 133/78 mmHg     Pulse Rate 09/26/14 0729 92     Resp 09/26/14 1002 24     Temp 09/26/14 0729 98.9 F (37.2 C)     Temp Source 09/26/14 0729 Oral     SpO2 09/26/14 0729 93 %     Weight 09/26/14 0729 290 lb (131.543  kg)     Height 09/26/14 0729 5\' 7"  (1.702 m)     Head Cir --      Peak Flow --      Pain Score --      Pain Loc --      Pain Edu? --      Excl. in [place]? --     Constitutional: Alert and oriented. Mild distress Eyes: Conjunctivae are normal. PERRL. EOMI. Head: Atraumatic. Nose: No congestion/rhinnorhea. Mouth/Throat: Mucous membranes are moist.  Oropharynx non-erythematous. Neck: No stridor.  No cervical spine tenderness to palpation. Hematological/Lymphatic/Immunilogical: No cervical lymphadenopathy. Cardiovascular: Normal rate, regular rhythm. Grossly normal heart sounds.  Good peripheral circulation. Respiratory: Audible wheezing . Bilateral upper rhonchi: Rales. O2 sats fluctuated between 8093%. Gastrointestinal: Soft and nontender. No distention. No abdominal bruits. No CVA tenderness. Musculoskeletal: No lower extremity tenderness nor edema.  No joint effusions. Neurologic:  Normal speech and language. No gross focal neurologic deficits are appreciated. No gait instability. Skin:  Skin is warm, dry and intact. No rash noted. Psychiatric: Mood and affect are normal. Speech and behavior are normal.  ____________________________________________   LABS (all labs ordered are listed, but only abnormal results are displayed)  Labs Reviewed  CBC WITH DIFFERENTIAL/PLATELET - Abnormal; Notable for the following:    Eosinophils Absolute 1.1 (*)    All other components within normal limits  BASIC METABOLIC PANEL - Abnormal; Notable for the following:    Potassium 3.3 (*)    Glucose, Bld 182 (*)    Calcium 8.7 (*)    All other components within normal limits  HEMOGLOBIN A1C - Abnormal; Notable for the following:    Hgb A1c MFr Bld 6.4 (*)    All other components within normal limits  GLUCOSE, CAPILLARY - Abnormal; Notable for the following:    Glucose-Capillary 398 (*)    All other components within normal limits  GLUCOSE, CAPILLARY - Abnormal; Notable for the following:     Glucose-Capillary 430 (*)    All other components within normal limits  COMPREHENSIVE METABOLIC PANEL - Abnormal; Notable for the following:    Glucose, Bld 251 (*)    Total Bilirubin 0.2 (*)    All other components within normal limits  GLUCOSE, CAPILLARY - Abnormal; Notable for the following:    Glucose-Capillary 276 (*)    All other components within normal limits  GLUCOSE, CAPILLARY - Abnormal; Notable for the following:    Glucose-Capillary 265 (*)    All other components within normal limits  GLUCOSE, CAPILLARY -  Abnormal; Notable for the following:    Glucose-Capillary 250 (*)    All other components within normal limits  BASIC METABOLIC PANEL - Abnormal; Notable for the following:    Glucose, Bld 310 (*)    All other components within normal limits  GLUCOSE, CAPILLARY - Abnormal; Notable for the following:    Glucose-Capillary 196 (*)    All other components within normal limits  GLUCOSE, CAPILLARY - Abnormal; Notable for the following:    Glucose-Capillary 316 (*)    All other components within normal limits  GLUCOSE, CAPILLARY - Abnormal; Notable for the following:    Glucose-Capillary 279 (*)    All other components within normal limits  GLUCOSE, CAPILLARY - Abnormal; Notable for the following:    Glucose-Capillary 232 (*)    All other components within normal limits  GLUCOSE, CAPILLARY - Abnormal; Notable for the following:    Glucose-Capillary 159 (*)    All other components within normal limits  GLUCOSE, CAPILLARY - Abnormal; Notable for the following:    Glucose-Capillary 222 (*)    All other components within normal limits  GLUCOSE, CAPILLARY - Abnormal; Notable for the following:    Glucose-Capillary 231 (*)    All other components within normal limits  GLUCOSE, CAPILLARY - Abnormal; Notable for the following:    Glucose-Capillary 165 (*)    All other components within normal limits  GLUCOSE, CAPILLARY - Abnormal; Notable for the following:     Glucose-Capillary 213 (*)    All other components within normal limits  GLUCOSE, CAPILLARY - Abnormal; Notable for the following:    Glucose-Capillary 150 (*)    All other components within normal limits  BRAIN NATRIURETIC PEPTIDE  TROPONIN I  CBC   ____________________________________________  EKG   ____________________________________________  RADIOLOGY  X-ray shows no infiltrate but bronchitic changes. I, Sable Feil, personally viewed and evaluated these images as part of my medical decision making.   ____________________________________________   PROCEDURES  Procedure(s) performed: None  Critical Care performed: No  ____________________________________________   INITIAL IMPRESSION / ASSESSMENT AND PLAN / ED COURSE  Pertinent labs & imaging results that were available during my care of the patient were reviewed by me and considered in my medical decision making (see chart for details).  Status post 3 DuoNeb treatments patient O2 sat still around 89%. Patient is also given 10 mg of Decadron IM. Patient states that she feels lethargic every time she moved O2 sats decreases. Discussed with Dr. Jimmye Norman who take a transfer of care for this patient ____________________________________________----------------------------------------- 10:14 AM on 09/26/2014 -----------------------------------------   Blood pressure 123/76, pulse 90, temperature 98.7 F (37.1 C), temperature source Oral, resp. rate 24, height 5\' 7"  (1.702 m), weight 290 lb (131.543 kg), SpO2 89 %.     FINAL CLINICAL IMPRESSION(S) / ED DIAGNOSES  Final diagnoses:  Asthma, unspecified asthma severity, with acute exacerbation  Hypoxia  Asthma, severe persistent, with acute exacerbation      Sable Feil, PA-C 09/30/14 1853  Earleen Newport, MD 10/01/14 [phone]

## 2014-09-26 NOTE — Progress Notes (Signed)
Patient had elevated blood sugar at 1500 today, but she had just eaten. NA will re-check in about an hour.

## 2014-09-26 NOTE — ED Provider Notes (Signed)
Assumed care from Randel Pigg PA. Patient still with persistent bilateral wheezing, hypoxia. She started receiving 3 treatments as well as some IV steroids. We'll obtain basic labs and will need to admit. We'll place her on the 30 minute continuous nebulizer treatment.CRITICAL CARE Performed by: Earleen Newport   Total critical care time: 30 minutes  Critical care time was exclusive of separately billable procedures and treating other patients.  Critical care was necessary to treat or prevent imminent or life-threatening deterioration.  Critical care was time spent personally by me on the following activities: development of treatment plan with patient and/or surrogate as well as nursing, discussions with consultants, evaluation of patient's response to treatment, examination of patient, obtaining history from patient or surrogate, ordering and performing treatments and interventions, ordering and review of laboratory studies, ordering and review of radiographic studies, pulse oximetry and re-evaluation of patient's condition.   Status asthmaticus  Plan will be admission, scheduled steroids and breathing treatments.  Earleen Newport, MD 09/26/14 1018

## 2014-09-26 NOTE — ED Provider Notes (Deleted)
[facility] Emergency Department Provider Note  ____________________________________________  Time seen: Approximately 8:07 AM  I have reviewed the triage vital signs and the nursing notes.   HISTORY  Chief Complaint Shortness of Breath and Wheezing    HPI Nancy Patton is a 54 y.o. female patient complain of increased wheezing and shortness of breath for 1 week. Patient state is resolve her current medications and also her rescue inhaler. Patient denies any pain with this complaint. She denies any URI signs symptoms.   Past Medical History  Diagnosis Date  . Asthma   . Hypertension   . Diabetes mellitus without complication   . Obesity   . Pulmonary hypertension     There are no active problems to display for this patient.   History reviewed. No pertinent past surgical history.  Current Outpatient Rx  Name  Route  Sig  Dispense  Refill  . albuterol (PROVENTIL HFA;VENTOLIN HFA) 108 (90 BASE) MCG/ACT inhaler      Inhale 4-6 puffs by mouth every 4 hours as needed for wheezing, cough, and/or shortness of breath   1 Inhaler   1   . cetirizine (ZYRTEC) 10 MG tablet   Oral   Take 1 tablet (10 mg total) by mouth daily.   30 tablet   0   . predniSONE (DELTASONE) 20 MG tablet   Oral   Take 3 tablets (60 mg total) by mouth daily.   15 tablet   0     Allergies Review of patient's allergies indicates no known allergies.  No family history on file.  Social History History  Substance Use Topics  . Smoking status: Former Research scientist (life sciences)  . Smokeless tobacco: Not on file  . Alcohol Use: No     Comment: 2nd hand smoke    Review of Systems Constitutional: No fever/chills Eyes: No visual changes. ENT: No sore throat. Cardiovascular: Denies chest pain. Respiratory: Shortness of breath and wheezing. Gastrointestinal: No abdominal pain.  No nausea, no vomiting.  No diarrhea.  No constipation. Genitourinary: Negative for dysuria. Musculoskeletal:  Negative for back pain. Skin: Negative for rash. Neurological Negative for headaches, focal weakness or numbness. Endocrine:Positive for pulmonary hypertension and diabetes and obesity. 10-point ROS otherwise negative.  ____________________________________________   PHYSICAL EXAM:  VITAL SIGNS: ED Triage Vitals  Enc Vitals Group     BP 09/26/14 0729 133/78 mmHg     Pulse Rate 09/26/14 0729 92     Resp --      Temp 09/26/14 0729 98.9 F (37.2 C)     Temp Source 09/26/14 0729 Oral     SpO2 09/26/14 0729 93 %     Weight 09/26/14 0729 290 lb (131.543 kg)     Height 09/26/14 0729 5\' 7"  (1.702 m)     Head Cir --      Peak Flow --      Pain Score --      Pain Loc --      Pain Edu? --      Excl. in [place]? --     Constitutional: Alert and oriented. Well appearing and in no acute distress. Eyes: Conjunctivae are normal. PERRL. EOMI. Head: Atraumatic. Nose: No congestion/rhinnorhea. Mouth/Throat: Mucous membranes are moist.  Oropharynx non-erythematous. Neck: No stridor.  No cervical spine tenderness to palpation. Hematological/Lymphatic/Immunilogical: No cervical lymphadenopathy. Cardiovascular: Normal rate, regular rhythm. Grossly normal heart sounds.  Good peripheral circulation. Respiratory: Audible wheezing.  Gastrointestinal: Soft and nontender. No distention. No abdominal bruits. No CVA tenderness.  Genitourinary:  Musculoskeletal: No lower extremity tenderness nor edema.  No joint effusions. Neurologic:  Normal speech and language. No gross focal neurologic deficits are appreciated. No gait instability. Skin:  Skin is warm, dry and intact. No rash noted. Psychiatric: Mood and affect are normal. Speech and behavior are normal.  ____________________________________________   LABS (all labs ordered are listed, but only abnormal results are displayed)  Labs Reviewed - No data to  display ____________________________________________  EKG   ____________________________________________  RADIOLOGY  6 for multiple bronchitic changes . I, Sable Feil, personally viewed and evaluated these images as part of my medical decision making.   PROCEDURES  Procedure(s) performed: None  Critical Care performed: No  ____________________________________________   INITIAL IMPRESSION / ASSESSMENT AND PLAN / ED COURSE  Pertinent labs & imaging results that were available during my care of the patient were reviewed by me and considered in my medical decision making (see chart for details).  Status post third DuoNeb treatment patient pulse ox was 89%. Patient stated she does not feel well. Discussed with Dr. Jimmye Norman will assume care of this patient. ____________________________________________   FINAL CLINICAL IMPRESSION(S) / ED DIAGNOSES  Final diagnoses:  None      Sable Feil, PA-C 09/26/14 1004

## 2014-09-26 NOTE — H&P (Signed)
History and Physical    Nancy Patton IDP:[id number] DOB: December 09, 1960 DOA: 09/26/2014  Referring physician: Dr. Jimmye Norman PCP: No primary care provider on file.  Specialists: none  Chief Complaint: SOB  HPI: Nancy Patton is a 54 y.o. female has a past medical history significant for asthma and DM in with 1 week hx of cough and worsening SOB. In ER, pt was hypoxic. Given 3 SVN's and 1 dose of IV steroids with no improvement. No CP or fever. Sputum is yellow. Remains SOB in ER.. CXR shows bronchitic changes. She is now admitted for further evaluation.  Review of Systems: The patient denies anorexia, fever, weight loss,, vision loss, decreased hearing, hoarseness, chest pain, syncope,  peripheral edema, balance deficits, hemoptysis, abdominal pain, melena, hematochezia, severe indigestion/heartburn, hematuria, incontinence, genital sores, muscle weakness, suspicious skin lesions, transient blindness, difficulty walking, depression, unusual weight change, abnormal bleeding, enlarged lymph nodes, angioedema, and breast masses.   Past Medical History  Diagnosis Date  . Asthma   . Hypertension   . Diabetes mellitus without complication   . Obesity   . Pulmonary hypertension    History reviewed. No pertinent past surgical history. Social History:  reports that she has quit smoking. She does not have any smokeless tobacco history on file. She reports that she does not drink alcohol or use illicit drugs.  No Known Allergies  History reviewed. No pertinent family history.  Prior to Admission medications   Medication Sig Start Date End Date Taking? Authorizing Provider  albuterol (PROVENTIL HFA;VENTOLIN HFA) 108 (90 BASE) MCG/ACT inhaler Inhale 4-6 puffs by mouth every 4 hours as needed for wheezing, cough, and/or shortness of breath 07/08/14   Hinda Kehr, MD  cetirizine (ZYRTEC) 10 MG tablet Take 1 tablet (10 mg total) by mouth daily. 07/08/14   Hinda Kehr, MD  predniSONE (DELTASONE) 20  MG tablet Take 3 tablets (60 mg total) by mouth daily. 07/08/14   Hinda Kehr, MD   Physical Exam: Filed Vitals:   09/26/14 0729 09/26/14 1002  BP: 133/78 123/76  Pulse: 92 90  Temp: 98.9 F (37.2 C) 98.7 F (37.1 C)  TempSrc: Oral Oral  Resp:  24  Height: 5\' 7"  (1.702 m)   Weight: 131.543 kg (290 lb)   SpO2: 93% 89%     General:  No apparent distress  Eyes: PERRL, EOMI, no scleral icterus  ENT: moist oropharynx  Neck: supple, no lymphadenopathy  Cardiovascular: regular rate without MRG; 2+ peripheral pulses, no JVD, no peripheral edema  Respiratory: CTA biL, good air movement without wheezing, rhonchi or crackled  Abdomen: soft, non tender to palpation, positive bowel sounds, no guarding, no rebound  Skin: no rashes  Musculoskeletal: normal bulk and tone, no joint swelling  Psychiatric: normal mood and affect  Neurologic: CN 2-12 grossly intact, MS 5/5 in all 4  Labs on Admission:  Basic Metabolic Panel:  Recent Labs Lab 09/26/14 1007  NA 142  K 3.3*  CL 108  CO2 26  GLUCOSE 182*  BUN 11  CREATININE 0.75  CALCIUM 8.7*   Liver Function Tests: No results for input(s): AST, ALT, ALKPHOS, BILITOT, PROT, ALBUMIN in the last 168 hours. No results for input(s): LIPASE, AMYLASE in the last 168 hours. No results for input(s): AMMONIA in the last 168 hours. CBC:  Recent Labs Lab 09/26/14 1007  WBC 5.3  NEUTROABS 2.4  HGB 12.1  HCT 35.9  MCV 89.5  PLT 185   Cardiac Enzymes:  Recent Labs Lab 09/26/14 1007  TROPONINI <0.03    BNP (last 3 results)  Recent Labs  09/26/14 1007  BNP 22.0    ProBNP (last 3 results) No results for input(s): PROBNP in the last 8760 hours.  CBG: No results for input(s): GLUCAP in the last 168 hours.  Radiological Exams on Admission: Dg Chest 2 View  09/26/2014   CLINICAL DATA:  Difficulty breathing getting worse over the past 2 days, history of asthma  EXAM: CHEST  2 VIEW  COMPARISON:  07/08/2014  FINDINGS:  Heart size and vascular pattern normal. No consolidation or effusion. Hyperinflation consistent with COPD. Moderate bronchitic change. Mildly increased markings at both lung bases likely a combination of atelectasis and bronchitic change.  IMPRESSION: Moderately severe bronchitic change similar but mildly worse when compared to prior study.   Electronically Signed   By: Skipper Cliche M.D.   On: 09/26/2014 08:49    EKG: Independently reviewed.  Assessment/Plan Principal Problem:   Asthmatic bronchitis with acute exacerbation Active Problems:   Acute respiratory distress   Will admit to floor with IV steroids, IV ABX, SVN's, and O2. Follow sugars. Wean O2 as tolerated. Repeat labs in AM.  Diet: carb controlled Fluids: NS with K+ DVT Prophylaxis: SQ Heparin  Code Status: FULL  Family Communication: none  Disposition Plan: home  Time spent: 50 min

## 2014-09-27 LAB — GLUCOSE, CAPILLARY
Glucose-Capillary: 265 mg/dL — ABNORMAL HIGH (ref 65–99)
Glucose-Capillary: 250 mg/dL — ABNORMAL HIGH (ref 65–99)

## 2014-09-27 LAB — COMPREHENSIVE METABOLIC PANEL
ALT: 29 U/L (ref 14–54)
AST: 28 U/L (ref 15–41)
Albumin: 3.6 g/dL (ref 3.5–5.0)
Alkaline Phosphatase: 52 U/L (ref 38–126)
Anion gap: 8 (ref 5–15)
BUN: 14 mg/dL (ref 6–20)
CO2: 23 mmol/L (ref 22–32)
Calcium: 8.9 mg/dL (ref 8.9–10.3)
Chloride: 108 mmol/L (ref 101–111)
Creatinine, Ser: 0.73 mg/dL (ref 0.44–1.00)
GFR calc Af Amer: >60 mL/min (ref >60)
GFR calc non Af Amer: >60 mL/min (ref >60)
Glucose, Bld: 251 mg/dL — ABNORMAL HIGH (ref 65–99)
Potassium: 4.4 mmol/L (ref 3.5–5.1)
Sodium: 139 mmol/L (ref 135–145)
Total Bilirubin: 0.2 mg/dL — ABNORMAL LOW (ref 0.3–1.2)
Total Protein: 6.5 g/dL (ref 6.5–8.1)

## 2014-09-27 LAB — CBC
HCT: 36 % (ref 35.0–47.0)
Hemoglobin: 12.3 g/dL (ref 12.0–16.0)
MCH: 30.4 pg (ref 26.0–34.0)
MCHC: 34.1 g/dL (ref 32.0–36.0)
MCV: 89.1 fL (ref 80.0–100.0)
Platelets: 194 10*3/uL (ref 150–440)
RBC: 4.04 MIL/uL (ref 3.80–5.20)
RDW: 14 % (ref 11.5–14.5)
WBC: 9.1 10*3/uL (ref 3.6–11.0)

## 2014-09-27 NOTE — Progress Notes (Signed)
Nancy Patton at [place] NAME: Nancy Patton    MR#:  [id number]  DATE OF BIRTH:  Aug 21, 1960  SUBJECTIVE:  +cough wheezing and SOB  Wondering why she continues to have several episodes of asthma exacerbation over the past several months. She was recently diagnosed with pulmonary hypertension by her pulmonologist. She had a 2-D echocardiogram at his office as well. She says she was not diagnosed with congestive heart failure.  REVIEW OF SYSTEMS:    Review of Systems  Constitutional: Negative for fever, chills and malaise/fatigue.  HENT: Negative for sore throat.   Eyes: Negative for blurred vision.  Respiratory: Positive for cough, sputum production, shortness of breath and wheezing. Negative for hemoptysis.   Cardiovascular: Negative for chest pain, palpitations and leg swelling.  Gastrointestinal: Negative for nausea, vomiting, abdominal pain, diarrhea and blood in stool.  Genitourinary: Negative for dysuria, urgency and frequency.  Musculoskeletal: Negative for back pain.  Neurological: Negative for dizziness, tremors and headaches.  Endo/Heme/Allergies: Does not bruise/bleed easily.    Tolerating Diet: Yes      DRUG ALLERGIES:  No Known Allergies  VITALS:  Blood pressure 132/65, pulse 93, temperature 98.2 F (36.8 C), temperature source Oral, resp. rate 18, height 5\' 7"  (1.702 m), weight 134.355 kg (296 lb 3.2 oz), SpO2 96 %.  PHYSICAL EXAMINATION:   Physical Exam  Constitutional: She is oriented to person, place, and time and well-developed, well-nourished, and in no distress. No distress.  HENT:  Head: Normocephalic.  Eyes: No scleral icterus.  Neck: Normal range of motion. Neck supple. No JVD present. No tracheal deviation present.  Cardiovascular: Normal rate, regular rhythm and normal heart sounds.  Exam reveals no gallop and no friction rub.   No murmur heard. Pulmonary/Chest: Effort normal. No respiratory  distress. She has wheezes. She has no rales. She exhibits no tenderness.  Abdominal: Soft. Bowel sounds are normal. She exhibits no distension and no mass. There is no tenderness. There is no rebound and no guarding.  Musculoskeletal: Normal range of motion. She exhibits no edema.  Neurological: She is alert and oriented to person, place, and time.  Skin: Skin is warm. No rash noted. No erythema.  Psychiatric: Affect and judgment normal.      LABORATORY PANEL:   CBC  Recent Labs Lab 09/27/14 0339  WBC 9.1  HGB 12.3  HCT 36.0  PLT 194   ------------------------------------------------------------------------------------------------------------------  Chemistries   Recent Labs Lab 09/27/14 0339  NA 139  K 4.4  CL 108  CO2 23  GLUCOSE 251*  BUN 14  CREATININE 0.73  CALCIUM 8.9  AST 28  ALT 29  ALKPHOS 52  BILITOT 0.2*   ------------------------------------------------------------------------------------------------------------------  Cardiac Enzymes  Recent Labs Lab 09/26/14 1007  TROPONINI <0.03   ------------------------------------------------------------------------------------------------------------------  RADIOLOGY:  Dg Chest 2 View  09/26/2014   CLINICAL DATA:  Difficulty breathing getting worse over the past 2 days, history of asthma  EXAM: CHEST  2 VIEW  COMPARISON:  07/08/2014  FINDINGS: Heart size and vascular pattern normal. No consolidation or effusion. Hyperinflation consistent with COPD. Moderate bronchitic change. Mildly increased markings at both lung bases likely a combination of atelectasis and bronchitic change.  IMPRESSION: Moderately severe bronchitic change similar but mildly worse when compared to prior study.   Electronically Signed   By: Skipper Cliche M.D.   On: 09/26/2014 08:49     ASSESSMENT AND PLAN:   54 year old female with past medical history significant for asthma/COPD and  diabetes who presents with acute respiratory  failure.  1. Acute hypoxic respiratory failure: This is secondary to asthma/COPD exacerbation. Plan as outlined below.  2. Acute asthma/COPD exacerbation: Patient continues to have wheezing, cough and shortness of breath. She has fair air movement on examination. I will continue with current dose of steroids and continue antibiotics. She will also need to continue [place] and DuoNeb's. Patient would like to see her pulmonologist. Consult has been placed for tomorrow. Patient has a history of pulmonary hypertension and may benefit from pulmonary vasodilator such as Viagra. Further recommendations as per pulmonary. She may be a patient that with require longer term steroids at discharge.   3. Diabetes: Patient will continue on Amaryl, metformin ADA diet and sliding scale insulin.  4. Depression/anxiety: Patient continue on Celexa and Ativan.    Management plans discussed with the patient and she is in agreement.  CODE STATUS: FULL  TOTAL TIME TAKING CARE OF THIS PATIENT: 30 minutes.     POSSIBLE D/C 2-3 days, DEPENDING ON CLINICAL CONDITION.   Marylynne Keelin M.D on 09/27/2014 at 8:55 AM  Between 7am to 6pm - Pager - [phone] After 6pm go to [email or web address] - password [email or web address] [facility]  [place] [facility]  Office  [phone]  CC: Primary care physician; No primary care provider on file.

## 2014-09-28 LAB — BASIC METABOLIC PANEL
Anion gap: 7 (ref 5–15)
BUN: 17 mg/dL (ref 6–20)
CO2: 26 mmol/L (ref 22–32)
Calcium: 9 mg/dL (ref 8.9–10.3)
Chloride: 107 mmol/L (ref 101–111)
Creatinine, Ser: 0.7 mg/dL (ref 0.44–1.00)
GFR calc Af Amer: >60 mL/min (ref >60)
GFR calc non Af Amer: >60 mL/min (ref >60)
Glucose, Bld: 310 mg/dL — ABNORMAL HIGH (ref 65–99)
Potassium: 4.3 mmol/L (ref 3.5–5.1)
Sodium: 140 mmol/L (ref 135–145)

## 2014-09-28 LAB — GLUCOSE, CAPILLARY
Glucose-Capillary: 196 mg/dL — ABNORMAL HIGH (ref 65–99)
Glucose-Capillary: 316 mg/dL — ABNORMAL HIGH (ref 65–99)
Glucose-Capillary: 279 mg/dL — ABNORMAL HIGH (ref 65–99)
Glucose-Capillary: 232 mg/dL — ABNORMAL HIGH (ref 65–99)
Glucose-Capillary: 159 mg/dL — ABNORMAL HIGH (ref 65–99)

## 2014-09-28 MED ORDER — METHYLPREDNISOLONE SODIUM SUCC 125 MG IJ SOLR
60.0000 mg | Freq: Three times a day (TID) | INTRAMUSCULAR | Status: DC
  Administered 2014-09-28 – 2014-09-30 (×6): 60 mg via INTRAVENOUS
  Filled 2014-09-28 (×6): qty 2

## 2014-09-28 MED ORDER — INSULIN DETEMIR 100 UNIT/ML ~~LOC~~ SOLN
25.0000 [IU] | Freq: Every day | SUBCUTANEOUS | Status: DC
  Administered 2014-09-28: 25 [IU] via SUBCUTANEOUS
  Filled 2014-09-28 (×2): qty 0.25

## 2014-09-28 MED ORDER — AZITHROMYCIN 250 MG PO TABS
500.0000 mg | ORAL_TABLET | Freq: Every day | ORAL | Status: DC
  Administered 2014-09-29 – 2014-09-30 (×2): 500 mg via ORAL
  Filled 2014-09-28 (×2): qty 2

## 2014-09-28 NOTE — Progress Notes (Signed)
Inpatient Diabetes Program Recommendations  AACE/ADA: New Consensus Statement on Inpatient Glycemic Control (2013)  Target Ranges:  Prepandial:   less than 140 mg/dL      Peak postprandial:   less than 180 mg/dL (1-2 hours)      Critically ill patients:  140 - 180 mg/dL   Review of Glycemic Control:  Results for ARBOR, LEER (MRN [id number]) as of 09/28/2014 09:22  Ref. Range 09/26/2014 21:06 09/27/2014 07:16 09/27/2014 11:16 09/27/2014 16:05 09/28/2014 07:24  Glucose-Capillary Latest Ref Range: 65-99 mg/dL 276 (H) 265 (H) 250 (H) 196 (H) 316 (H)  Results for TYNESIA, HARRAL (MRN [id number]) as of 09/28/2014 09:22  Ref. Range 09/26/2014 10:07  Hemoglobin A1C Latest Ref Range: 4.0-6.0 % 6.4 (H)    Diabetes history: Type 2 diabetes Outpatient Diabetes medications: Amaryl 2 mg bid, Metformin 1000 mg bid Current orders for Inpatient glycemic control:   Amaryl 2 mg bid, Metformin 1000 mg bid, Novolog moderate tid with meals  A1C indicates good control of blood sugars prior to admit. Note glucose increased with steroids.  May consider increasing Novolog correction to resistant and add Bedtime coverage while patient is on steroids.  While in the hospital, consider adding Levemir 25 units q AM.  As steroids are tapered, will need to also decrease Levemir.    Adah Perl, RN, BC-ADM Inpatient Diabetes Coordinator Pager [phone] (8a-5p)

## 2014-09-28 NOTE — Plan of Care (Signed)
Problem: Phase I Progression Outcomes Goal: Discharge plan established Outcome: Progressing Patient concerned about losing her job due to health issues

## 2014-09-28 NOTE — Progress Notes (Signed)
[place] at [place] NAME: Nancy Patton    MR#:  [id number]  DATE OF BIRTH:  1960-07-24  SUBJECTIVE:  Still persistent cough wheezing and SOB, just not feeling well. Wondering why she continues to have several episodes of asthma exacerbation over the past several months. She was recently diagnosed with pulmonary hypertension by her pulmonologist and wants to discuss treatment for same with her pulmonologist, also worried about her work, dropped her sats to 88% on minimal ambulation REVIEW OF SYSTEMS:   Review of Systems  Constitutional: Negative for fever, chills and malaise/fatigue.  HENT: Negative for sore throat.   Eyes: Negative for blurred vision.  Respiratory: Positive for cough, sputum production, shortness of breath and wheezing. Negative for hemoptysis.   Cardiovascular: Negative for chest pain, palpitations and leg swelling.  Gastrointestinal: Negative for nausea, vomiting, abdominal pain, diarrhea and blood in stool.  Genitourinary: Negative for dysuria, urgency and frequency.  Musculoskeletal: Negative for back pain.  Neurological: Negative for dizziness, tremors and headaches.  Endo/Heme/Allergies: Does not bruise/bleed easily.   Tolerating Diet: Yes DRUG ALLERGIES:  No Known Allergies VITALS:  Blood pressure 122/70, pulse 96, temperature 98 F (36.7 C), temperature source Oral, resp. rate 20, height 5\' 7"  (1.702 m), weight 137.213 kg (302 lb 8 oz), SpO2 99 %. PHYSICAL EXAMINATION:  Physical Exam  Constitutional: She is oriented to person, place, and time and well-developed, well-nourished, and in no distress. No distress.  HENT:  Head: Normocephalic.  Eyes: No scleral icterus.  Neck: Normal range of motion. Neck supple. No JVD present. No tracheal deviation present.  Cardiovascular: Normal rate, regular rhythm and normal heart sounds.  Exam reveals no gallop and no friction rub.   No murmur heard. Pulmonary/Chest:  Effort normal. No respiratory distress. She has wheezes. She has no rales. She exhibits no tenderness.  Abdominal: Soft. Bowel sounds are normal. She exhibits no distension and no mass. There is no tenderness. There is no rebound and no guarding.  Musculoskeletal: Normal range of motion. She exhibits no edema.  Neurological: She is alert and oriented to person, place, and time.  Skin: Skin is warm. No rash noted. No erythema.  Psychiatric: Affect and judgment normal.   LABORATORY PANEL:   CBC  Recent Labs Lab 09/27/14 0339  WBC 9.1  HGB 12.3  HCT 36.0  PLT 194   ------------------------------------------------------------------------------------------------------------------  Chemistries   Recent Labs Lab 09/27/14 0339 09/28/14 0349  NA 139 140  K 4.4 4.3  CL 108 107  CO2 23 26  GLUCOSE 251* 310*  BUN 14 17  CREATININE 0.73 0.70  CALCIUM 8.9 9.0  AST 28  --   ALT 29  --   ALKPHOS 52  --   BILITOT 0.2*  --    ASSESSMENT AND PLAN:   54 year old female with past medical history significant for asthma/COPD and diabetes who presents with acute respiratory failure.  1. Acute hypoxic respiratory failure: This is secondary to Asthma/COPD exacerbation. Plan as outlined below. Will benefit from [place] rehab at D/C  2. Acute asthma/COPD exacerbation: Patient continues to have wheezing, cough and shortness of breath. She has fair air movement on examination. continue with current dose of steroids and antibiotics. also need to continue [place] and DuoNeb's. Patient would like to see her pulmonologist. Consult has been placed. Patient has a history of pulmonary hypertension and may benefit from pulmonary vasodilator such as Viagra. Further recommendations as per pulmonary. She may benefit from longer term  steroids at discharge.  3. Diabetes: continue on Amaryl, metformin ADA diet and sliding scale insulin. Will add Levemir for better sugar control as it's running in 250-300s,  Appreciate Diabetes coordinator's input  4. Depression/anxiety: Patient continue on Celexa and Ativan.   Management plans discussed with the patient and she is in agreement.  In regards to her work - I've recommended she will need to see her [place] (per employer) or outpt doctors (PCP)  CODE STATUS: FULL  TOTAL TIME TAKING CARE OF THIS PATIENT: 30 minutes.   >50% time spent on counselling and coordination of care - Yes (discussed with her daughter at bedside)  POSSIBLE D/C 1-2 days, DEPENDING ON CLINICAL CONDITION AND PULMO EVAL   [facility], Seena Face M.D on 09/28/2014 at 12:28 PM  Between 7am to 6pm - Pager - [phone] After 6pm go to [email or web address] - password [email or web address] [facility]  [place] [facility]  Office  [phone]  CC: Primary care physician; No primary care provider on file.

## 2014-09-28 NOTE — Consult Note (Signed)
Pulmonary Critical Care  Initial Consult Note   Nancy Patton KYH:[id number] DOB: [birth year] DOA: 09/26/2014  Referring physician: Fulton Reek, MD PCP: No primary care provider on file.   Chief Complaint: Shortness of breath  HPI: Nancy Patton is a 54 y.o. female with histroy of asthma and OSA presents to the hospital with Asthma exacerbation. Patient states that she has been in and out of the hospital for asthma several times. She has had several courses of steroids over the last 2-3 months. She states that hecough had worsened and she has had wheeze noted. She states that she has not had recently been tested for allergens. She states in the past she has had allergy to grasses but was not started on shots. Patient states that she is compliatn with her medications.   Review of Systems:  Constitutional:  ++weight gain, no night sweats, no Fevers.  HEENT:  No headaches, nasal congestion, +post nasal drip,  Cardio-vascular:  No chest pain, swelling in lower extremities, anasarca, dizziness, palpitations  GI:  No heartburn, indigestion, abdominal pain, nausea, vomiting, diarrhea  Resp:  +shortness of breath with exertion +productive cough, +wheezing Skin:  no rash or lesions Musculoskeletal:  No joint pain or swelling.   Remainder ROS performed and is unremarkable other than noted in HPI  Past Medical History  Diagnosis Date  . Asthma   . Hypertension   . Diabetes mellitus without complication   . Obesity   . Pulmonary hypertension    History reviewed. No pertinent past surgical history. Social History:  reports that she has quit smoking. She does not have any smokeless tobacco history on file. She reports that she does not drink alcohol or use illicit drugs.  No Known Allergies  History reviewed. No pertinent family history.  Prior to Admission medications   Medication Sig Start Date End Date Taking? Authorizing Provider  albuterol (PROVENTIL HFA;VENTOLIN HFA) 108  (90 BASE) MCG/ACT inhaler Inhale 2 puffs into the lungs every 6 (six) hours as needed for wheezing or shortness of breath.   Yes Historical Provider, MD  BREO ELLIPTA 200-25 MCG/INH AEPB Inhale 1 puff into the lungs daily. 09/14/14  Yes Historical Provider, MD  cetirizine (ZYRTEC) 10 MG tablet Take 1 tablet (10 mg total) by mouth daily. 07/08/14  Yes Hinda Kehr, MD  citalopram (CELEXA) 20 MG tablet Take 20 mg by mouth daily. 09/15/14  Yes Historical Provider, MD  glimepiride (AMARYL) 2 MG tablet Take 2 mg by mouth 2 (two) times daily. 09/15/14  Yes Historical Provider, MD  levalbuterol (XOPENEX) 1.25 MG/3ML nebulizer solution Take 3 mLs by nebulization 3 (three) times daily. 09/18/14  Yes Historical Provider, MD  losartan-hydrochlorothiazide (HYZAAR) 50-12.5 MG per tablet Take 1 tablet by mouth daily. 09/15/14  Yes Historical Provider, MD  metFORMIN (GLUCOPHAGE) 1000 MG tablet Take 1,000 mg by mouth 2 (two) times daily. 09/09/14  Yes Historical Provider, MD  montelukast (SINGULAIR) 10 MG tablet Take 10 mg by mouth daily. 09/14/14  Yes Historical Provider, MD  albuterol (PROVENTIL HFA;VENTOLIN HFA) 108 (90 BASE) MCG/ACT inhaler Inhale 4-6 puffs by mouth every 4 hours as needed for wheezing, cough, and/or shortness of breath 07/08/14   Hinda Kehr, MD  predniSONE (DELTASONE) 20 MG tablet Take 3 tablets (60 mg total) by mouth daily. 07/08/14   Hinda Kehr, MD   Physical Exam: Filed Vitals:   09/28/14 0911 09/28/14 1000 09/28/14 1005 09/28/14 1127  BP: 141/71   122/70  Pulse: 99   96  Temp:  98 F (36.7 C)  TempSrc:    Oral  Resp:    20  Height:      Weight:      SpO2: 100% 88% 95% 99%    Wt Readings from Last 3 Encounters:  09/28/14 137.213 kg (302 lb 8 oz)  07/08/14 122.471 kg (270 lb)    General:  Appears anxious at rest Eyes: PERRL, normal lids, irises & conjunctiva ENT: grossly normal hearing, lips & tongue Neck: no LAD, masses or thyromegaly Cardiovascular: RRR, no m/r/g. No LE  edema. Respiratory: Ronchi noted bilaterally. Normal respiratory effort. Abdomen: soft, nontender Skin: no rash or induration seen on limited exam Musculoskeletal: grossly normal tone BUE/BLE Psychiatric: anxious Neurologic: grossly non-focal.          Labs on Admission:  Basic Metabolic Panel:  Recent Labs Lab 09/26/14 1007 09/27/14 0339 09/28/14 0349  NA 142 139 140  K 3.3* 4.4 4.3  CL 108 108 107  CO2 26 23 26   GLUCOSE 182* 251* 310*  BUN 11 14 17   CREATININE 0.75 0.73 0.70  CALCIUM 8.7* 8.9 9.0   Liver Function Tests:  Recent Labs Lab 09/27/14 0339  AST 28  ALT 29  ALKPHOS 52  BILITOT 0.2*  PROT 6.5  ALBUMIN 3.6   No results for input(s): LIPASE, AMYLASE in the last 168 hours. No results for input(s): AMMONIA in the last 168 hours. CBC:  Recent Labs Lab 09/26/14 1007 09/27/14 0339  WBC 5.3 9.1  NEUTROABS 2.4  --   HGB 12.1 12.3  HCT 35.9 36.0  MCV 89.5 89.1  PLT 185 194   Cardiac Enzymes:  Recent Labs Lab 09/26/14 1007  TROPONINI <0.03    BNP (last 3 results)  Recent Labs  09/26/14 1007  BNP 22.0    ProBNP (last 3 results) No results for input(s): PROBNP in the last 8760 hours.  CBG:  Recent Labs Lab 09/27/14 0716 09/27/14 1116 09/27/14 1605 09/28/14 0724 09/28/14 1123  GLUCAP 265* 250* 196* 316* 279*    Radiological Exams on Admission: No results found.  EKG: Independently reviewed.  Assessment/Plan Principal Problem:   Asthmatic bronchitis with acute exacerbation Active Problems:   Acute respiratory distress   Asthmatic bronchitis with exacerbation   1. Chronic severe persistent Asthma with acute exacerbation -i have reviewed her medications in detail would continue with inhalers as you are and azithromycin/rocephin -continue with steroids she will need a slow taper on discharge -would consider aminophylline by mouth as theophylline is not available. Continue with singulair   2. Morbid Obesity -weight  reduction counseling is needed. This would most certainly help with her overall health situation and her respiratory status  3. Obstructive Sleep Apnea -she has been non-compliant with CPAP in past. She states that she is trying to be more compliant. Will follow up as outpatient   Time spent: 10min    I have personally obtained a history, examined the patient, evaluated laboratory and imaging results, formulated the assessment and plan and placed orders.  The Patient requires high complexity decision making for assessment and support.    Allyne Gee, MD [facility] Pulmonary Critical Care Medicine Sleep Medicine

## 2014-09-28 NOTE — Care Management Note (Signed)
Case Management Note  Patient Details  Name: Nancy Patton MRN: [id number] Date of Birth: 11/24/1960  Subjective/Objective:                  Patient had taken her O2 off and was independently going to the bathroom. She did not appear to be in distress until she started answering my questions - she became short of breathe. Action/Plan: Met with patient to discuss discharge planning.  I spoke with patient about being on O2 and it not necessary. I spoke with RN about weaning O2 and possibly leaving O2 off since patient is not chronically on home O2.. O2 sats 99% on 3 liters/[place]. RN assessment with exertion on room air-still 90's.   Expected Discharge Date:                  Expected Discharge Plan:     In-House Referral:  NA  Discharge planning Services  CM Consult  Post Acute Care Choice:    Choice offered to:  Patient  DME Arranged:    DME Agency:     HH Arranged:    [place] Agency:     Status of Service:  In process, will continue to follow  Medicare Important Message Given:    Date Medicare IM Given:    Medicare IM give by:    Date Additional Medicare IM Given:    Additional Medicare Important Message give by:     If discussed at [employer] of Stay Meetings, dates discussed:    Additional Comments:  Marshell Garfinkel, RN 09/28/2014, 9:45 AM

## 2014-09-28 NOTE — Progress Notes (Addendum)
SATURATION QUALIFICATIONS: (This note is used to comply with regulatory documentation for home oxygen)  Patient Saturations on Room Air at Rest = 88% Please briefly explain why patient needs home oxygen: Asthma, obesity

## 2014-09-29 LAB — GLUCOSE, CAPILLARY
Glucose-Capillary: 222 mg/dL — ABNORMAL HIGH (ref 65–99)
Glucose-Capillary: 231 mg/dL — ABNORMAL HIGH (ref 65–99)
Glucose-Capillary: 165 mg/dL — ABNORMAL HIGH (ref 65–99)
Glucose-Capillary: 213 mg/dL — ABNORMAL HIGH (ref 65–99)

## 2014-09-29 MED ORDER — INSULIN DETEMIR 100 UNIT/ML ~~LOC~~ SOLN
30.0000 [IU] | Freq: Every day | SUBCUTANEOUS | Status: DC
  Administered 2014-09-29: 30 [IU] via SUBCUTANEOUS
  Filled 2014-09-29 (×3): qty 0.3

## 2014-09-29 MED ORDER — LIVING WELL WITH DIABETES BOOK
Freq: Once | Status: AC
  Administered 2014-09-29: 13:00:00
  Filled 2014-09-29: qty 1

## 2014-09-29 MED ORDER — CEFUROXIME AXETIL 500 MG PO TABS
500.0000 mg | ORAL_TABLET | Freq: Two times a day (BID) | ORAL | Status: DC
  Administered 2014-09-30: 500 mg via ORAL
  Filled 2014-09-29: qty 1

## 2014-09-29 NOTE — Progress Notes (Signed)
[place] at [place] NAME: Nancy Patton    MR#:  [id number]  DATE OF BIRTH:  1960-03-09  SUBJECTIVE:  Feels somewhat better but not great,   REVIEW OF SYSTEMS:   Review of Systems  Constitutional: Negative for fever, chills and malaise/fatigue.  HENT: Negative for sore throat.   Eyes: Negative for blurred vision.  Respiratory: Positive for cough, sputum production, shortness of breath and wheezing. Negative for hemoptysis.   Cardiovascular: Negative for chest pain, palpitations and leg swelling.  Gastrointestinal: Negative for nausea, vomiting, abdominal pain, diarrhea and blood in stool.  Genitourinary: Negative for dysuria, urgency and frequency.  Musculoskeletal: Negative for back pain.  Neurological: Negative for dizziness, tremors and headaches.  Endo/Heme/Allergies: Does not bruise/bleed easily.   Tolerating Diet: Yes DRUG ALLERGIES:  No Known Allergies VITALS:  Blood pressure 115/50, pulse 82, temperature 97.9 F (36.6 C), temperature source Oral, resp. rate 16, height 5\' 7"  (1.702 m), weight 137.485 kg (303 lb 1.6 oz), SpO2 94 %. PHYSICAL EXAMINATION:  Physical Exam  Constitutional: She is oriented to person, place, and time and well-developed, well-nourished, and in no distress. No distress.  HENT:  Head: Normocephalic.  Eyes: No scleral icterus.  Neck: Normal range of motion. Neck supple. No JVD present. No tracheal deviation present.  Cardiovascular: Normal rate, regular rhythm and normal heart sounds.  Exam reveals no gallop and no friction rub.   No murmur heard. Pulmonary/Chest: Effort normal. No respiratory distress. She has wheezes. She has no rales. She exhibits no tenderness.  Abdominal: Soft. Bowel sounds are normal. She exhibits no distension and no mass. There is no tenderness. There is no rebound and no guarding.  Musculoskeletal: Normal range of motion. She exhibits no edema.  Neurological: She is alert  and oriented to person, place, and time.  Skin: Skin is warm. No rash noted. No erythema.  Psychiatric: Affect and judgment normal.   LABORATORY PANEL:   CBC  Recent Labs Lab 09/27/14 0339  WBC 9.1  HGB 12.3  HCT 36.0  PLT 194   ------------------------------------------------------------------------------------------------------------------  Chemistries   Recent Labs Lab 09/27/14 0339 09/28/14 0349  NA 139 140  K 4.4 4.3  CL 108 107  CO2 23 26  GLUCOSE 251* 310*  BUN 14 17  CREATININE 0.73 0.70  CALCIUM 8.9 9.0  AST 28  --   ALT 29  --   ALKPHOS 52  --   BILITOT 0.2*  --    ASSESSMENT AND PLAN:   54 year old female with past medical history significant for asthma/COPD and diabetes who presents with acute respiratory failure.  1. Acute hypoxic respiratory failure: This is secondary to Asthma/COPD exacerbation. Plan as outlined below. Will benefit from [place] rehab at D/C  2. Acute asthma/COPD exacerbation: Patient continues to have wheezing, cough and shortness of breath. She has fair air movement on examination. continue with current dose of steroids and antibiotics. also need to continue [place] and DuoNeb's. Patient would like to see her pulmonologist. Consult has been placed. Patient has a history of pulmonary hypertension and may benefit from pulmonary vasodilator such as Viagra. Further recommendations as per pulmonary. She may benefit from longer term steroids at discharge.   3. Diabetes: continue on Amaryl, metformin ADA diet and sliding scale insulin. increase Levemir to 30 units QHS, Appreciate Diabetes coordinator's input  4. Depression/anxiety: Patient continue on Celexa and Ativan.   Management plans discussed with the patient and she is in agreement.  In  regards to her work - I've recommended she will need to see her [place] (per employer) or outpt doctors (PCP)  CODE STATUS: FULL  TOTAL TIME TAKING CARE OF THIS PATIENT: 30 minutes.    >50% time spent on counselling and coordination of care - Yes  POSSIBLE D/C in AM, DEPENDING ON CLINICAL CONDITION AND PULMO EVAL   Yazan Gatling M.D on 09/29/2014 at 9:11 AM  Between 7am to 6pm - Pager - [phone] After 6pm go to [email or web address] - password [email or web address] [email or web address]  [place] [facility]  Office  [phone]  CC: Primary care physician; No primary care provider on file.

## 2014-09-29 NOTE — Progress Notes (Signed)
Inpatient Diabetes Program Recommendations  AACE/ADA: New Consensus Statement on Inpatient Glycemic Control (2013)  Target Ranges:  Prepandial:   less than 140 mg/dL      Peak postprandial:   less than 180 mg/dL (1-2 hours)      Critically ill patients:  140 - 180 mg/dL   Reason for Visit: consult  Met with patient to discuss need for insulin while inpatient- after much discussion discovered patient has had diabetes education many years ago- lacks knowledge now.  Appreciative of consult to dietitian.  Encourage to call her own insurance to see if case management may be an option for her as she has had multiple admissions for asthma exacerbation and complains that she is in distress almost monthly.  Also encouraged her to ask about coverage for a dietitian and diabetes education.   Reveals she's eating a small amount at 7am but has low blood sugars at 10-11am then eats a huge meal at supper.  Discussed basic physiology of diabetes and the need for 3 meals and perhaps 3 snacks per day.   Ordered Living Well with Diabetes and answered patient questions.  Spoke to RN taking care of patient today- encouraged insulin teaching tomorrow if the patient is discharged home on insulin- can order insulin teaching kit in the  "mange orders" tab of EPIC.  Patient aware there may be a need for insulin at discharge and encouraged to ask questions before discharge so she fully understand the expectations of managing her diabetes.   Gentry Fitz, RN, BA, MHA, CDE Diabetes Coordinator Inpatient Diabetes Program  [phone] (Team Pager) [phone] ([place]) 09/29/2014 1:37 PM

## 2014-09-29 NOTE — Progress Notes (Addendum)
Inpatient Diabetes Program Recommendations  AACE/ADA: New Consensus Statement on Inpatient Glycemic Control (2013)  Target Ranges:  Prepandial:   less than 140 mg/dL      Peak postprandial:   less than 180 mg/dL (1-2 hours)      Critically ill patients:  140 - 180 mg/dL   Results for KIMBA, LOTTES (MRN [id number]) as of 09/29/2014 08:58  Ref. Range 09/28/2014 07:24 09/28/2014 11:23 09/28/2014 15:58 09/28/2014 21:03 09/29/2014 07:29  Glucose-Capillary Latest Ref Range: 65-99 mg/dL 316 (H) 279 (H) 232 (H) 159 (H) 222 (H)   Diabetes history: Type 2 diabetes Outpatient Diabetes medications: Amaryl 2 mg bid, Metformin 1000 mg bid  Current orders for Inpatient glycemic control:  Amaryl 2 mg bid, Metformin 1000 mg bid, Novolog moderate tid with meals, Levemir 25 units qhs  Consider increasing the Levemir insulin to 30 units qhs.   Gentry Fitz, RN, BA, MHA, CDE Diabetes Coordinator Inpatient Diabetes Program  [phone] (Team Pager) [phone] ([place]) 09/29/2014 9:04 AM

## 2014-09-30 LAB — GLUCOSE, CAPILLARY: Glucose-Capillary: 150 mg/dL — ABNORMAL HIGH (ref 65–99)

## 2014-09-30 MED ORDER — AZITHROMYCIN 250 MG PO TABS: 250.0000 mg | ORAL_TABLET | Freq: Every day | ORAL | Status: DC

## 2014-09-30 MED ORDER — PREDNISONE 10 MG PO TABS: 60.0000 mg | ORAL_TABLET | Freq: Every day | ORAL | Status: DC

## 2014-09-30 NOTE — Discharge Summary (Signed)
[place] [facility] [place] NAME: Nancy Patton    MR#:  [id number]  DATE OF BIRTH:  [birth year]  DATE OF ADMISSION:  09/26/2014 ADMITTING PHYSICIAN: Idelle Crouch, MD  DATE OF DISCHARGE: 09/30/2014 11:42 AM  PRIMARY CARE PHYSICIAN: Lamonte Sakai MD  ADMISSION DIAGNOSIS:  Hypoxia [R09.02] Asthma, unspecified asthma severity, with acute exacerbation [J45.901]  DISCHARGE DIAGNOSIS:  Principal Problem:   Asthmatic bronchitis with acute exacerbation Active Problems:   Acute respiratory distress   Asthmatic bronchitis with exacerbation  SECONDARY DIAGNOSIS:   Past Medical History  Diagnosis Date  . Asthma   . Hypertension   . Diabetes mellitus without complication   . Obesity   . Pulmonary hypertension    HOSPITAL COURSE:  54 y.o. female has a past medical history significant for asthma and DM admitted for 1 week hx of cough and worsening SOB and was noted to have Acute hypoxic respiratory failure: secondary to Asthma/COPD exacerbation. She was started IV abx, steroids, Nebulizer breathing treatments. Please see Dr Stacie Glaze dictated [place] for further details. She had very slow improvement. Pulmonary c/s was obtained with Dr Humphrey Rolls who recommended outpt evaluation for her Pulmonary Hypertension and may be sleep study for her sleep apnea.  She qualified for 2 liters O2 and was set up for her at home on D/C. She was feeling much better by 10th Aug and was D/C in stable condition. She requested work excuse which I've given till the weekend after which she will follow with her PCP and Pulmo to decide need for Short term disability which she is been looking for. DISCHARGE CONDITIONS:  Stable CONSULTS OBTAINED:  Treatment Team:  Allyne Gee, MD DRUG ALLERGIES:  No Known Allergies DISCHARGE MEDICATIONS:   Discharge Medication List as of 09/30/2014 10:22 AM    START taking these medications   Details  azithromycin (ZITHROMAX) 250 MG tablet  Take 1 tablet (250 mg total) by mouth daily., Starting 09/30/2014, Until Discontinued, Normal      CONTINUE these medications which have CHANGED   Details  predniSONE (DELTASONE) 10 MG tablet Take 6 tablets (60 mg total) by mouth daily with breakfast., Starting 09/30/2014, Until Discontinued, Normal      CONTINUE these medications which have NOT CHANGED   Details  !! albuterol (PROVENTIL HFA;VENTOLIN HFA) 108 (90 BASE) MCG/ACT inhaler Inhale 2 puffs into the lungs every 6 (six) hours as needed for wheezing or shortness of breath., Until Discontinued, Historical Med    BREO ELLIPTA 200-25 MCG/INH AEPB Inhale 1 puff into the lungs daily., Starting 09/14/2014, Until Discontinued, Historical Med    cetirizine (ZYRTEC) 10 MG tablet Take 1 tablet (10 mg total) by mouth daily., Starting 07/08/2014, Until Discontinued, Print    citalopram (CELEXA) 20 MG tablet Take 20 mg by mouth daily., Starting 09/15/2014, Until Discontinued, Historical Med    glimepiride (AMARYL) 2 MG tablet Take 2 mg by mouth 2 (two) times daily., Starting 09/15/2014, Until Discontinued, Historical Med    levalbuterol (XOPENEX) 1.25 MG/3ML nebulizer solution Take 3 mLs by nebulization 3 (three) times daily., Starting 09/18/2014, Until Discontinued, Historical Med    losartan-hydrochlorothiazide (HYZAAR) 50-12.5 MG per tablet Take 1 tablet by mouth daily., Starting 09/15/2014, Until Discontinued, Historical Med    metFORMIN (GLUCOPHAGE) 1000 MG tablet Take 1,000 mg by mouth 2 (two) times daily., Starting 09/09/2014, Until Discontinued, Historical Med    montelukast (SINGULAIR) 10 MG tablet Take 10 mg by mouth daily., Starting 09/14/2014, Until  Discontinued, Historical Med    !! albuterol (PROVENTIL HFA;VENTOLIN HFA) 108 (90 BASE) MCG/ACT inhaler Inhale 4-6 puffs by mouth every 4 hours as needed for wheezing, cough, and/or shortness of breath, Print     !! - Potential duplicate medications found. Please discuss with provider.      DISCHARGE INSTRUCTIONS:   DIET:  Cardiac diet, 1800 ADA DISCHARGE CONDITION:  Good ACTIVITY:  Activity as tolerated OXYGEN:  Home Oxygen: Yes.    Oxygen Delivery: 2 liters/min via Patient connected to nasal cannula oxygen DISCHARGE LOCATION:  home   If you experience worsening of your admission symptoms, develop shortness of breath, life threatening emergency, suicidal or homicidal thoughts you must seek medical attention immediately by calling 911 or calling your MD immediately  if symptoms less severe.  You Must read complete instructions/literature along with all the possible adverse reactions/side effects for all the Medicines you take and that have been prescribed to you. Take any new Medicines after you have completely understood and accpet all the possible adverse reactions/side effects.   Please note  You were cared for by a hospitalist during your hospital stay. If you have any questions about your discharge medications or the care you received while you were in the hospital after you are discharged, you can call the unit and asked to speak with the hospitalist on call if the hospitalist that took care of you is not available. Once you are discharged, your primary care physician will handle any further medical issues. Please note that NO REFILLS for any discharge medications will be authorized once you are discharged, as it is imperative that you return to your primary care physician (or establish a relationship with a primary care physician if you do not have one) for your aftercare needs so that they can reassess your need for medications and monitor your lab values.   On the day of Discharge:  VITAL SIGNS:  Blood pressure 136/79, pulse 70, temperature 97.9 F (36.6 C), temperature source Oral, resp. rate 18, height 5\' 7"  (1.702 m), weight 138.075 kg (304 lb 6.4 oz), SpO2 97 %. I/O:   Intake/Output Summary (Last 24 hours) at 09/30/14 2337 Last data filed at 09/30/14  0900  Gross per 24 hour  Intake    240 ml  Output      0 ml  Net    240 ml   PHYSICAL EXAMINATION:  GENERAL:  54 y.o.-year-old patient lying in the bed with no acute distress.  EYES: Pupils equal, round, reactive to light and accommodation. No scleral icterus. Extraocular muscles intact.  HEENT: Head atraumatic, normocephalic. Oropharynx and nasopharynx clear.  NECK:  Supple, no jugular venous distention. No thyroid enlargement, no tenderness.  LUNGS: Normal breath sounds bilaterally, no wheezing, rales,rhonchi or crepitation. No use of accessory muscles of respiration.  CARDIOVASCULAR: S1, S2 normal. No murmurs, rubs, or gallops.  ABDOMEN: Soft, non-tender, non-distended. Bowel sounds present. No organomegaly or mass.  EXTREMITIES: No pedal edema, cyanosis, or clubbing.  NEUROLOGIC: Cranial nerves II through XII are intact. Muscle strength 5/5 in all extremities. Sensation intact. Gait not checked.  PSYCHIATRIC: The patient is alert and oriented x 3.  SKIN: No obvious rash, lesion, or ulcer.  DATA REVIEW:   CBC  Recent Labs Lab 09/27/14 0339  WBC 9.1  HGB 12.3  HCT 36.0  PLT 194    Chemistries   Recent Labs Lab 09/27/14 0339 09/28/14 0349  NA 139 140  K 4.4 4.3  CL 108 107  CO2  23 26  GLUCOSE 251* 310*  BUN 14 17  CREATININE 0.73 0.70  CALCIUM 8.9 9.0  AST 28  --   ALT 29  --   ALKPHOS 52  --   BILITOT 0.2*  --     Cardiac Enzymes  Management plans discussed with the patient, family and they are in agreement.  CODE STATUS:   TOTAL TIME TAKING CARE OF THIS PATIENT: 55 minutes.    [facility], Kedarius Aloisi M.D on 09/30/2014 at 11:37 PM  Between 7am to 6pm - Pager - [phone]  After 6pm go to [email or web address] - password [email or web address] [email or web address] [facility]  Office  [phone]  CC: Primary care physician; Lamonte Sakai MD Bea Laura MD

## 2014-09-30 NOTE — Care Management (Signed)
O2 to be delivered by [facility] prior to discharge. No further RNCM needs.

## 2014-09-30 NOTE — Plan of Care (Signed)
Problem: Food- and Nutrition-Related Knowledge Deficit (NB-1.1) Goal: Nutrition education Formal process to instruct or train a patient/client in a skill or to impart knowledge to help patients/clients voluntarily manage or modify food choices and eating behavior to maintain or improve health. Outcome: Completed/Met Date Met:  09/30/14  RD consulted for nutrition education regarding diabetes.     Lab Results  Component Value Date    HGBA1C 6.4* 09/26/2014    RD provided "Carbohydrate Counting for People with Diabetes" handout from the Academy of Nutrition and Dietetics. Discussed different food groups and their effects on blood sugar, emphasizing carbohydrate-containing foods. Provided list of carbohydrates and recommended serving sizes of common foods. Pt also given "The Plate Method" handout for further examples and reference.  Discussed importance of controlled and consistent carbohydrate intake throughout the day. Provided examples of ways to balance meals/snacks and encouraged intake of high-fiber, whole grain complex carbohydrates. Teach back method used.  Expect good compliance.  Body mass index is 47.66 kg/(m^2).   Current diet order is Carb Modified, patient is consuming approximately 75-100% of meals at this time. Labs and medications reviewed. No further nutrition interventions warranted at this time. RD contact information provided. If additional nutrition issues arise, please re-consult RD.  Dwyane Luo, [place], LDN Pager [phone]

## 2014-09-30 NOTE — Discharge Instructions (Signed)
Chronic Obstructive Pulmonary Disease Chronic obstructive pulmonary disease (COPD) is a common lung problem. In COPD, the flow of air from the lungs is limited. The way your lungs work will probably never return to normal, but there are things you can do to improve your lungs and make yourself feel better. HOME CARE  Take all medicines as told by your doctor.  Avoid medicines or cough syrups that dry up your airway (such as antihistamines) and do not allow you to get rid of thick spit. You do not need to avoid them if told differently by your doctor.  If you smoke, stop. Smoking makes the problem worse.  Avoid being around things that make your breathing worse (like smoke, chemicals, and fumes).  Use oxygen therapy and therapy to help improve your lungs (pulmonary rehabilitation) if told by your doctor. If you need home oxygen therapy, ask your doctor if you should buy a tool to measure your oxygen level (oximeter).  Avoid people who have a sickness you can catch (contagious).  Avoid going outside when it is very hot, cold, or humid.  Eat healthy foods. Eat smaller meals more often. Rest before meals.  Stay active, but remember to also rest.  Make sure to get all the shots (vaccines) your doctor recommends. Ask your doctor if you need a pneumonia shot.  Learn and use tips on how to relax.  Learn and use tips on how to control your breathing as told by your doctor. Try:  Breathing in (inhaling) through your nose for 1 second. Then, pucker your lips and breath out (exhale) through your lips for 2 seconds.  Putting one hand on your belly (abdomen). Breathe in slowly through your nose for 1 second. Your hand on your belly should move out. Pucker your lips and breathe out slowly through your lips. Your hand on your belly should move in as you breathe out.  Learn and use controlled coughing to clear thick spit from your lungs. The steps are: 1. Lean your head a little forward. 2. Breathe  in deeply. 3. Try to hold your breath for 3 seconds. 4. Keep your mouth slightly open while coughing 2 times. 5. Spit any thick spit out into a tissue. 6. Rest and do the steps again 1 or 2 times as needed. GET HELP IF:  You cough up more thick spit than usual.  There is a change in the color or thickness of the spit.  It is harder to breathe than usual.  Your breathing is faster than usual. GET HELP RIGHT AWAY IF:   You have shortness of breath while resting.  You have shortness of breath that stops you from:  Being able to talk.  Doing normal activities.  You chest hurts for longer than 5 minutes.  Your skin color is more blue than usual.  Your pulse oximeter shows that you have low oxygen for longer than 5 minutes. MAKE SURE YOU:   Understand these instructions.  Will watch your condition.  Will get help right away if you are not doing well or get worse. Document Released: 07/26/2007 Document Revised: 06/23/2013 Document Reviewed: 10/03/2012 ExitCare Patient Information 2015 [employer]. This information is not intended to replace advice given to you by your health care provider. Make sure you discuss any questions you have with your health care provider.  

## 2014-09-30 NOTE — Progress Notes (Signed)
Pt. D/c'd to home. D/C instructions reviewed with pt. SL removed and pt. Left with home o2 tank.

## 2014-09-30 NOTE — Progress Notes (Signed)
[place] at [place] was admitted to the [facility] on 09/26/2014 and Discharged  09/30/2014 and should be excused from work  for 4 days starting 09/30/2014 , may return to work without any restrictions starting 10/05/2014.  Max Sane M.D on 09/30/2014,at 10:35 AM  [place] [facility]  [phone]

## 2014-09-30 NOTE — Progress Notes (Signed)
Discharge pt. Home per Dr. Manuella Ghazi

## 2015-01-15 ENCOUNTER — Encounter: Payer: Self-pay | Admitting: Emergency Medicine

## 2015-01-15 ENCOUNTER — Emergency Department: Payer: Managed Care, Other (non HMO)

## 2015-01-15 ENCOUNTER — Emergency Department
Admission: EM | Admit: 2015-01-15 | Discharge: 2015-01-15 | Disposition: A | Discharge status: Home / Self Care | Payer: Managed Care, Other (non HMO) | Attending: Emergency Medicine | Admitting: Emergency Medicine

## 2015-01-15 DIAGNOSIS — I10 Essential (primary) hypertension: Secondary | ICD-10-CM | POA: Insufficient documentation

## 2015-01-15 DIAGNOSIS — J441 Chronic obstructive pulmonary disease with (acute) exacerbation: Secondary | ICD-10-CM | POA: Diagnosis not present

## 2015-01-15 DIAGNOSIS — J45901 Unspecified asthma with (acute) exacerbation: Secondary | ICD-10-CM

## 2015-01-15 DIAGNOSIS — Z7951 Long term (current) use of inhaled steroids: Secondary | ICD-10-CM | POA: Diagnosis not present

## 2015-01-15 DIAGNOSIS — R0602 Shortness of breath: Secondary | ICD-10-CM | POA: Diagnosis present

## 2015-01-15 DIAGNOSIS — E119 Type 2 diabetes mellitus without complications: Secondary | ICD-10-CM | POA: Insufficient documentation

## 2015-01-15 DIAGNOSIS — Z79899 Other long term (current) drug therapy: Secondary | ICD-10-CM | POA: Diagnosis not present

## 2015-01-15 DIAGNOSIS — Z87891 Personal history of nicotine dependence: Secondary | ICD-10-CM | POA: Insufficient documentation

## 2015-01-15 LAB — CBC WITH DIFFERENTIAL/PLATELET
Basophils Absolute: 0 10*3/uL (ref 0–0.1)
Basophils Relative: 0 %
Eosinophils Absolute: 1.1 10*3/uL — ABNORMAL HIGH (ref 0–0.7)
Eosinophils Relative: 8 %
HCT: 42.1 % (ref 35.0–47.0)
Hemoglobin: 13.9 g/dL (ref 12.0–16.0)
Lymphocytes Relative: 26 %
Lymphs Abs: 3.5 10*3/uL (ref 1.0–3.6)
MCH: 28.5 pg (ref 26.0–34.0)
MCHC: 33 g/dL (ref 32.0–36.0)
MCV: 86.3 fL (ref 80.0–100.0)
Monocytes Absolute: 1.1 10*3/uL — ABNORMAL HIGH (ref 0.2–0.9)
Monocytes Relative: 8 %
Neutro Abs: 7.4 10*3/uL — ABNORMAL HIGH (ref 1.4–6.5)
Neutrophils Relative %: 58 %
Platelets: 224 10*3/uL (ref 150–440)
RBC: 4.89 MIL/uL (ref 3.80–5.20)
RDW: 14.3 % (ref 11.5–14.5)
WBC: 13.1 10*3/uL — ABNORMAL HIGH (ref 3.6–11.0)

## 2015-01-15 LAB — BASIC METABOLIC PANEL
Anion gap: 7 (ref 5–15)
BUN: 15 mg/dL (ref 6–20)
CO2: 27 mmol/L (ref 22–32)
Calcium: 9.3 mg/dL (ref 8.9–10.3)
Chloride: 106 mmol/L (ref 101–111)
Creatinine, Ser: 0.94 mg/dL (ref 0.44–1.00)
GFR calc Af Amer: >60 mL/min (ref >60)
GFR calc non Af Amer: >60 mL/min (ref >60)
Glucose, Bld: 109 mg/dL — ABNORMAL HIGH (ref 65–99)
Potassium: 3.7 mmol/L (ref 3.5–5.1)
Sodium: 140 mmol/L (ref 135–145)

## 2015-01-15 LAB — TROPONIN I: Troponin I: <0.03 ng/mL (ref <0.031)

## 2015-01-15 MED ORDER — AZITHROMYCIN 250 MG PO TABS: ORAL_TABLET | ORAL | Status: DC

## 2015-01-15 MED ORDER — PREDNISONE 20 MG PO TABS: 40.0000 mg | ORAL_TABLET | Freq: Every day | ORAL | Status: DC

## 2015-01-15 MED ORDER — IPRATROPIUM-ALBUTEROL 0.5-2.5 (3) MG/3ML IN SOLN
3.0000 mL | Freq: Once | RESPIRATORY_TRACT | Status: AC
  Administered 2015-01-15: 3 mL via RESPIRATORY_TRACT
  Filled 2015-01-15: qty 3

## 2015-01-15 MED ORDER — ALBUTEROL SULFATE (2.5 MG/3ML) 0.083% IN NEBU
INHALATION_SOLUTION | RESPIRATORY_TRACT | Status: AC
  Administered 2015-01-15: 5 mg via RESPIRATORY_TRACT
  Filled 2015-01-15: qty 6

## 2015-01-15 MED ORDER — ALBUTEROL SULFATE (2.5 MG/3ML) 0.083% IN NEBU
5.0000 mg | INHALATION_SOLUTION | Freq: Once | RESPIRATORY_TRACT | Status: AC
  Administered 2015-01-15: 5 mg via RESPIRATORY_TRACT

## 2015-01-15 MED ORDER — METHYLPREDNISOLONE SODIUM SUCC 125 MG IJ SOLR
125.0000 mg | Freq: Once | INTRAMUSCULAR | Status: AC
  Administered 2015-01-15: 125 mg via INTRAVENOUS
  Filled 2015-01-15: qty 2

## 2015-01-15 MED ORDER — MAGNESIUM SULFATE 2 GM/50ML IV SOLN
2.0000 g | Freq: Once | INTRAVENOUS | Status: AC
  Administered 2015-01-15: 2 g via INTRAVENOUS
  Filled 2015-01-15: qty 50

## 2015-01-15 NOTE — ED Notes (Signed)
Pt reports shortness of breath x1 week, finished 6 day prednisone taper 2 days ago, reports hx of asthma, has been using rescue inhalers and SVN tx at home with no relief. Pt with audible wheezing in triage, increased respiratory rate noted.

## 2015-01-15 NOTE — ED Provider Notes (Signed)
[facility] Emergency Department Provider Note    ____________________________________________  Time seen: 1255  I have reviewed the triage vital signs and the nursing notes.   HISTORY  Chief Complaint Shortness of Breath   History limited by: Not Limited   HPI Nancy Patton is a 54 y.o. female who presents to the emergency department today because of concerns for shortness breath. The patient states she has a history of COPD. She states it she finished a prednisone taper roughly 3 days ago. She states that she has had continued shortness breath and wheezing. It has been constant. It is moderate. She denies any pain in her chest. She has been trying her inhalers at home without relief. She denies any fevers.   Past Medical History  Diagnosis Date  . Asthma   . Hypertension   . Diabetes mellitus without complication ([place])   . Obesity   . Pulmonary hypertension [facility])     Patient Active Problem List   Diagnosis Date Noted  . Acute respiratory distress (HCC) 09/26/2014  . Asthmatic bronchitis with acute exacerbation 09/26/2014  . Asthmatic bronchitis with exacerbation 09/26/2014    History reviewed. No pertinent past surgical history.  Current Outpatient Rx  Name  Route  Sig  Dispense  Refill  . albuterol (PROVENTIL HFA;VENTOLIN HFA) 108 (90 BASE) MCG/ACT inhaler      Inhale 4-6 puffs by mouth every 4 hours as needed for wheezing, cough, and/or shortness of breath   1 Inhaler   1   . albuterol (PROVENTIL HFA;VENTOLIN HFA) 108 (90 BASE) MCG/ACT inhaler   Inhalation   Inhale 2 puffs into the lungs every 6 (six) hours as needed for wheezing or shortness of breath.         Marland Kitchen azithromycin (ZITHROMAX) 250 MG tablet   Oral   Take 1 tablet (250 mg total) by mouth daily.   5 each   0   . BREO ELLIPTA 200-25 MCG/INH AEPB   Inhalation   Inhale 1 puff into the lungs daily.           Dispense as written.   . cetirizine (ZYRTEC) 10 MG  tablet   Oral   Take 1 tablet (10 mg total) by mouth daily.   30 tablet   0   . citalopram (CELEXA) 20 MG tablet   Oral   Take 20 mg by mouth daily.      10   . glimepiride (AMARYL) 2 MG tablet   Oral   Take 2 mg by mouth 2 (two) times daily.      3   . levalbuterol (XOPENEX) 1.25 MG/3ML nebulizer solution   Nebulization   Take 3 mLs by nebulization 3 (three) times daily.      6   . losartan-hydrochlorothiazide (HYZAAR) 50-12.5 MG per tablet   Oral   Take 1 tablet by mouth daily.      5   . metFORMIN (GLUCOPHAGE) 1000 MG tablet   Oral   Take 1,000 mg by mouth 2 (two) times daily.      2   . montelukast (SINGULAIR) 10 MG tablet   Oral   Take 10 mg by mouth daily.         . predniSONE (DELTASONE) 10 MG tablet   Oral   Take 6 tablets (60 mg total) by mouth daily with breakfast.   42 tablet   0     Start 60 mg once daily and taper 10 mg every other .Marland KitchenMarland Kitchen  Allergies Review of patient's allergies indicates no known allergies.  No family history on file.  Social History Social History  Substance Use Topics  . Smoking status: Former Research scientist (life sciences)  . Smokeless tobacco: None  . Alcohol Use: No     Comment: 2nd hand smoke    Review of Systems  Constitutional: Negative for fever. Cardiovascular: Negative for chest pain. Respiratory: Positive for shortness of breath. Gastrointestinal: Negative for abdominal pain, vomiting and diarrhea. Neurological: Negative for headaches, focal weakness or numbness.   10-point ROS otherwise negative.  ____________________________________________   PHYSICAL EXAM:  VITAL SIGNS: ED Triage Vitals  Enc Vitals Group     BP 01/15/15 1205 122/76 mmHg     Pulse Rate 01/15/15 1205 89     Resp 01/15/15 1205 28     Temp 01/15/15 1205 98.4 F (36.9 C)     Temp Source 01/15/15 1205 Oral     SpO2 01/15/15 1205 92 %     Weight 01/15/15 1205 285 lb (129.275 kg)     Height 01/15/15 1205 5\' 7"  (1.702 m)     Head Cir --       Peak Flow --      Pain Score 01/15/15 1206 0   Constitutional: Alert and oriented. Well appearing and in mild respiratory distress Eyes: Conjunctivae are normal. PERRL. Normal extraocular movements. ENT   Head: Normocephalic and atraumatic.   Nose: No congestion/rhinnorhea.   Mouth/Throat: Mucous membranes are moist.   Neck: No stridor. Hematological/Lymphatic/Immunilogical: No cervical lymphadenopathy. Cardiovascular: Normal rate, regular rhythm.  No murmurs, rubs, or gallops. Respiratory: Slightly increased respiratory effort. Diffuse wheezing. Gastrointestinal: Soft and nontender. No distention.  Genitourinary: Deferred Musculoskeletal: Normal range of motion in all extremities. No joint effusions.  No lower extremity tenderness nor edema. Neurologic:  Normal speech and language. No gross focal neurologic deficits are appreciated.  Skin:  Skin is warm, dry and intact. No rash noted. Psychiatric: Mood and affect are normal. Speech and behavior are normal. Patient exhibits appropriate insight and judgment.  ____________________________________________    LABS (pertinent positives/negatives)  Labs Reviewed  CBC WITH DIFFERENTIAL/PLATELET - Abnormal; Notable for the following:    WBC 13.1 (*)    Neutro Abs 7.4 (*)    Monocytes Absolute 1.1 (*)    Eosinophils Absolute 1.1 (*)    All other components within normal limits  BASIC METABOLIC PANEL - Abnormal; Notable for the following:    Glucose, Bld 109 (*)    All other components within normal limits  TROPONIN I     ____________________________________________   EKG  I, Nance Pear, attending physician, personally viewed and interpreted this EKG  EKG Time: 1203 Rate: 94 Rhythm: NSR Axis: normal Intervals: qtc 487 QRS: narrow ST changes: no st elevation Impression: normal ekg ____________________________________________    RADIOLOGY  CXR  IMPRESSION: Mild bilateral interstitial prominence  suggesting mild pneumonitis.   ____________________________________________   PROCEDURES  Procedure(s) performed: None  Critical Care performed: No  ____________________________________________   INITIAL IMPRESSION / ASSESSMENT AND PLAN / ED COURSE  Pertinent labs & imaging results that were available during my care of the patient were reviewed by me and considered in my medical decision making (see chart for details).  Patient presented to the emergency department today with concerns for continued shortness of breath and asthma exacerbation. Patient was given duo nebs, Solu-Medrol and magnesium here. Patient stated she did feel better after treatments. On repeat auscultation patient's wheezing had improved. She was moving much more air. Will  plan on discharging home with antibiotics and prednisone.  ____________________________________________   FINAL CLINICAL IMPRESSION(S) / ED DIAGNOSES  Final diagnoses:  Asthma exacerbation     Nance Pear, MD 01/16/15 [phone]

## 2015-01-15 NOTE — Discharge Instructions (Signed)
Please seek medical attention for any high fevers, chest pain, shortness of breath, change in behavior, persistent vomiting, bloody stool or any other new or concerning symptoms.   Asthma, Adult Asthma is a recurring condition in which the airways tighten and narrow. Asthma can make it difficult to breathe. It can cause coughing, wheezing, and shortness of breath. Asthma episodes, also called asthma attacks, range from minor to life-threatening. Asthma cannot be cured, but medicines and lifestyle changes can help control it. CAUSES Asthma is believed to be caused by inherited (genetic) and environmental factors, but its exact cause is unknown. Asthma may be triggered by allergens, lung infections, or irritants in the air. Asthma triggers are different for each person. Common triggers include:   Animal dander.  Dust mites.  Cockroaches.  Pollen from trees or grass.  Mold.  Smoke.  Air pollutants such as dust, household cleaners, hair sprays, aerosol sprays, paint fumes, strong chemicals, or strong odors.  Cold air, weather changes, and winds (which increase molds and pollens in the air).  Strong emotional expressions such as crying or laughing hard.  Stress.  Certain medicines (such as aspirin) or types of drugs (such as beta-blockers).  Sulfites in foods and drinks. Foods and drinks that may contain sulfites include dried fruit, potato chips, and sparkling grape juice.  Infections or inflammatory conditions such as the flu, a cold, or an inflammation of the nasal membranes (rhinitis).  Gastroesophageal reflux disease (GERD).  Exercise or strenuous activity. SYMPTOMS Symptoms may occur immediately after asthma is triggered or many hours later. Symptoms include:  Wheezing.  Excessive nighttime or early morning coughing.  Frequent or severe coughing with a common cold.  Chest tightness.  Shortness of breath. DIAGNOSIS  The diagnosis of asthma is made by a review of your  medical history and a physical exam. Tests may also be performed. These may include:  Lung function studies. These tests show how much air you breathe in and out.  Allergy tests.  Imaging tests such as X-rays. TREATMENT  Asthma cannot be cured, but it can usually be controlled. Treatment involves identifying and avoiding your asthma triggers. It also involves medicines. There are 2 classes of medicine used for asthma treatment:   Controller medicines. These prevent asthma symptoms from occurring. They are usually taken every day.  Reliever or rescue medicines. These quickly relieve asthma symptoms. They are used as needed and provide short-term relief. Your health care provider will help you create an asthma action plan. An asthma action plan is a written plan for managing and treating your asthma attacks. It includes a list of your asthma triggers and how they may be avoided. It also includes information on when medicines should be taken and when their dosage should be changed. An action plan may also involve the use of a device called a peak flow meter. A peak flow meter measures how well the lungs are working. It helps you monitor your condition. HOME CARE INSTRUCTIONS   Take medicines only as directed by your health care provider. Speak with your health care provider if you have questions about how or when to take the medicines.  Use a peak flow meter as directed by your health care provider. Record and keep track of readings.  Understand and use the action plan to help minimize or stop an asthma attack without needing to seek medical care.  Control your home environment in the following ways to help prevent asthma attacks:  Do not smoke. Avoid being exposed  to secondhand smoke.  Change your heating and air conditioning filter regularly.  Limit your use of fireplaces and wood stoves.  Get rid of pests (such as roaches and mice) and their droppings.  Throw away plants if you see  mold on them.  Clean your floors and dust regularly. Use unscented cleaning products.  Try to have someone else vacuum for you regularly. Stay out of rooms while they are being vacuumed and for a short while afterward. If you vacuum, use a dust mask from a hardware store, a double-layered or microfilter vacuum cleaner bag, or a vacuum cleaner with a HEPA filter.  Replace carpet with wood, tile, or vinyl flooring. Carpet can trap dander and dust.  Use allergy-proof pillows, mattress covers, and box spring covers.  Wash bed sheets and blankets every week in hot water and dry them in a dryer.  Use blankets that are made of polyester or cotton.  Clean bathrooms and kitchens with bleach. If possible, have someone repaint the walls in these rooms with mold-resistant paint. Keep out of the rooms that are being cleaned and painted.  Wash hands frequently. SEEK MEDICAL CARE IF:   You have wheezing, shortness of breath, or a cough even if taking medicine to prevent attacks.  The colored mucus you cough up (sputum) is thicker than usual.  Your sputum changes from clear or white to yellow, green, gray, or bloody.  You have any problems that may be related to the medicines you are taking (such as a rash, itching, swelling, or trouble breathing).  You are using a reliever medicine more than 2-3 times per week.  Your peak flow is still at 50-79% of your personal best after following your action plan for 1 hour.  You have a fever. SEEK IMMEDIATE MEDICAL CARE IF:   You seem to be getting worse and are unresponsive to treatment during an asthma attack.  You are short of breath even at rest.  You get short of breath when doing very little physical activity.  You have difficulty eating, drinking, or talking due to asthma symptoms.  You develop chest pain.  You develop a fast heartbeat.  You have a bluish color to your lips or fingernails.  You are light-headed, dizzy, or faint.  Your  peak flow is less than 50% of your personal best.   This information is not intended to replace advice given to you by your health care provider. Make sure you discuss any questions you have with your health care provider.   Document Released: 02/06/2005 Document Revised: 10/28/2014 Document Reviewed: 09/05/2012 Elsevier Interactive Patient Education [employer].

## 2015-06-14 DIAGNOSIS — J4520 Mild intermittent asthma, uncomplicated: Secondary | ICD-10-CM | POA: Insufficient documentation

## 2015-06-14 DIAGNOSIS — I10 Essential (primary) hypertension: Secondary | ICD-10-CM | POA: Diagnosis present

## 2015-06-14 DIAGNOSIS — E119 Type 2 diabetes mellitus without complications: Secondary | ICD-10-CM

## 2015-06-14 DIAGNOSIS — F32A Depression, unspecified: Secondary | ICD-10-CM | POA: Diagnosis present

## 2015-06-21 ENCOUNTER — Other Ambulatory Visit: Payer: Self-pay | Admitting: Internal Medicine

## 2015-06-21 DIAGNOSIS — Z1231 Encounter for screening mammogram for malignant neoplasm of breast: Secondary | ICD-10-CM

## 2015-06-30 ENCOUNTER — Ambulatory Visit
Admission: RE | Admit: 2015-06-30 | Discharge: 2015-06-30 | Disposition: A | Discharge status: Home / Self Care | Payer: Managed Care, Other (non HMO) | Source: Ambulatory Visit | Attending: Internal Medicine | Admitting: Internal Medicine

## 2015-06-30 DIAGNOSIS — Z1231 Encounter for screening mammogram for malignant neoplasm of breast: Secondary | ICD-10-CM | POA: Diagnosis not present

## 2015-07-10 ENCOUNTER — Encounter: Payer: Self-pay | Admitting: Emergency Medicine

## 2015-07-10 ENCOUNTER — Emergency Department: Payer: Managed Care, Other (non HMO)

## 2015-07-10 ENCOUNTER — Inpatient Hospital Stay
Admission: EM | Admit: 2015-07-10 | Discharge: 2015-07-13 | DRG: 202 | Disposition: A | Discharge status: Home / Self Care | Payer: Managed Care, Other (non HMO) | Attending: Internal Medicine | Admitting: Internal Medicine

## 2015-07-10 DIAGNOSIS — E1165 Type 2 diabetes mellitus with hyperglycemia: Secondary | ICD-10-CM | POA: Diagnosis present

## 2015-07-10 DIAGNOSIS — E669 Obesity, unspecified: Secondary | ICD-10-CM | POA: Diagnosis present

## 2015-07-10 DIAGNOSIS — Z87891 Personal history of nicotine dependence: Secondary | ICD-10-CM

## 2015-07-10 DIAGNOSIS — J45901 Unspecified asthma with (acute) exacerbation: Secondary | ICD-10-CM | POA: Diagnosis present

## 2015-07-10 DIAGNOSIS — Z6841 Body Mass Index (BMI) 40.0 and over, adult: Secondary | ICD-10-CM

## 2015-07-10 DIAGNOSIS — J4541 Moderate persistent asthma with (acute) exacerbation: Principal | ICD-10-CM | POA: Diagnosis present

## 2015-07-10 DIAGNOSIS — I10 Essential (primary) hypertension: Secondary | ICD-10-CM | POA: Diagnosis present

## 2015-07-10 DIAGNOSIS — Z7984 Long term (current) use of oral hypoglycemic drugs: Secondary | ICD-10-CM

## 2015-07-10 DIAGNOSIS — R0902 Hypoxemia: Secondary | ICD-10-CM | POA: Diagnosis present

## 2015-07-10 DIAGNOSIS — I272 Other secondary pulmonary hypertension: Secondary | ICD-10-CM | POA: Diagnosis present

## 2015-07-10 DIAGNOSIS — F329 Major depressive disorder, single episode, unspecified: Secondary | ICD-10-CM | POA: Diagnosis present

## 2015-07-10 DIAGNOSIS — Z7952 Long term (current) use of systemic steroids: Secondary | ICD-10-CM

## 2015-07-10 DIAGNOSIS — E119 Type 2 diabetes mellitus without complications: Secondary | ICD-10-CM

## 2015-07-10 DIAGNOSIS — Z79899 Other long term (current) drug therapy: Secondary | ICD-10-CM | POA: Diagnosis not present

## 2015-07-10 DIAGNOSIS — T380X5A Adverse effect of glucocorticoids and synthetic analogues, initial encounter: Secondary | ICD-10-CM | POA: Diagnosis present

## 2015-07-10 DIAGNOSIS — F32A Depression, unspecified: Secondary | ICD-10-CM | POA: Diagnosis present

## 2015-07-10 LAB — GLUCOSE, CAPILLARY: Glucose-Capillary: 279 mg/dL — ABNORMAL HIGH (ref 65–99)

## 2015-07-10 LAB — CBC
HCT: 39.2 % (ref 35.0–47.0)
Hemoglobin: 13.3 g/dL (ref 12.0–16.0)
MCH: 28.7 pg (ref 26.0–34.0)
MCHC: 33.9 g/dL (ref 32.0–36.0)
MCV: 84.8 fL (ref 80.0–100.0)
Platelets: 225 10*3/uL (ref 150–440)
RBC: 4.63 MIL/uL (ref 3.80–5.20)
RDW: 14.9 % — ABNORMAL HIGH (ref 11.5–14.5)
WBC: 8.8 10*3/uL (ref 3.6–11.0)

## 2015-07-10 LAB — COMPREHENSIVE METABOLIC PANEL
ALT: 43 U/L (ref 14–54)
AST: 37 U/L (ref 15–41)
Albumin: 4.3 g/dL (ref 3.5–5.0)
Alkaline Phosphatase: 73 U/L (ref 38–126)
Anion gap: 9 (ref 5–15)
BUN: 12 mg/dL (ref 6–20)
CO2: 24 mmol/L (ref 22–32)
Calcium: 9.5 mg/dL (ref 8.9–10.3)
Chloride: 105 mmol/L (ref 101–111)
Creatinine, Ser: 0.83 mg/dL (ref 0.44–1.00)
GFR calc Af Amer: >60 mL/min (ref >60)
GFR calc non Af Amer: >60 mL/min (ref >60)
Glucose, Bld: 221 mg/dL — ABNORMAL HIGH (ref 65–99)
Potassium: 3.6 mmol/L (ref 3.5–5.1)
Sodium: 138 mmol/L (ref 135–145)
Total Bilirubin: 0.6 mg/dL (ref 0.3–1.2)
Total Protein: 7.1 g/dL (ref 6.5–8.1)

## 2015-07-10 LAB — TROPONIN I: Troponin I: <0.03 ng/mL (ref <0.031)

## 2015-07-10 MED ORDER — ONDANSETRON HCL 4 MG/2ML IJ SOLN: 4.0000 mg | Freq: Four times a day (QID) | INTRAMUSCULAR | Status: DC | PRN

## 2015-07-10 MED ORDER — INSULIN ASPART 100 UNIT/ML ~~LOC~~ SOLN
0.0000 [IU] | Freq: Three times a day (TID) | SUBCUTANEOUS | Status: DC
  Administered 2015-07-11: 5 [IU] via SUBCUTANEOUS
  Administered 2015-07-11: 7 [IU] via SUBCUTANEOUS
  Administered 2015-07-11 – 2015-07-12 (×2): 5 [IU] via SUBCUTANEOUS
  Administered 2015-07-12 (×2): 7 [IU] via SUBCUTANEOUS
  Administered 2015-07-13: 12:00:00 5 [IU] via SUBCUTANEOUS
  Administered 2015-07-13: 08:00:00 3 [IU] via SUBCUTANEOUS
  Filled 2015-07-10 (×2): qty 7
  Filled 2015-07-10: qty 4
  Filled 2015-07-10: qty 5
  Filled 2015-07-10: qty 7
  Filled 2015-07-10: qty 3
  Filled 2015-07-10 (×3): qty 5
  Filled 2015-07-10: qty 2

## 2015-07-10 MED ORDER — CITALOPRAM HYDROBROMIDE 20 MG PO TABS
20.0000 mg | ORAL_TABLET | Freq: Every day | ORAL | Status: DC
  Administered 2015-07-11 – 2015-07-13 (×3): 20 mg via ORAL
  Filled 2015-07-10 (×3): qty 1

## 2015-07-10 MED ORDER — METHYLPREDNISOLONE SODIUM SUCC 125 MG IJ SOLR
60.0000 mg | Freq: Four times a day (QID) | INTRAMUSCULAR | Status: DC
  Administered 2015-07-10 – 2015-07-12 (×6): 60 mg via INTRAVENOUS
  Filled 2015-07-10 (×6): qty 2

## 2015-07-10 MED ORDER — INSULIN ASPART 100 UNIT/ML ~~LOC~~ SOLN
0.0000 [IU] | Freq: Every day | SUBCUTANEOUS | Status: DC
  Administered 2015-07-10: 3 [IU] via SUBCUTANEOUS
  Administered 2015-07-11: 4 [IU] via SUBCUTANEOUS
  Administered 2015-07-12: 2 [IU] via SUBCUTANEOUS
  Filled 2015-07-10: qty 3

## 2015-07-10 MED ORDER — PREDNISONE 20 MG PO TABS: 40.0000 mg | ORAL_TABLET | Freq: Every day | ORAL | Status: DC

## 2015-07-10 MED ORDER — FLUTICASONE FUROATE-VILANTEROL 200-25 MCG/INH IN AEPB
1.0000 | INHALATION_SPRAY | Freq: Every day | RESPIRATORY_TRACT | Status: DC
  Administered 2015-07-11 – 2015-07-13 (×3): 1 via RESPIRATORY_TRACT
  Filled 2015-07-10: qty 28

## 2015-07-10 MED ORDER — METHYLPREDNISOLONE SODIUM SUCC 125 MG IJ SOLR
125.0000 mg | Freq: Once | INTRAMUSCULAR | Status: AC
Start: 2015-07-10 — End: 2015-07-10
  Administered 2015-07-10: 125 mg via INTRAVENOUS
  Filled 2015-07-10: qty 2

## 2015-07-10 MED ORDER — IPRATROPIUM-ALBUTEROL 0.5-2.5 (3) MG/3ML IN SOLN
3.0000 mL | Freq: Once | RESPIRATORY_TRACT | Status: AC
  Administered 2015-07-10: 3 mL via RESPIRATORY_TRACT
  Filled 2015-07-10: qty 3

## 2015-07-10 MED ORDER — ACETAMINOPHEN 650 MG RE SUPP: 650.0000 mg | Freq: Four times a day (QID) | RECTAL | Status: DC | PRN

## 2015-07-10 MED ORDER — IPRATROPIUM-ALBUTEROL 0.5-2.5 (3) MG/3ML IN SOLN
3.0000 mL | Freq: Once | RESPIRATORY_TRACT | Status: AC
  Administered 2015-07-10: 3 mL via RESPIRATORY_TRACT

## 2015-07-10 MED ORDER — ACETAMINOPHEN 325 MG PO TABS: 650.0000 mg | ORAL_TABLET | Freq: Four times a day (QID) | ORAL | Status: DC | PRN

## 2015-07-10 MED ORDER — ONDANSETRON HCL 4 MG PO TABS: 4.0000 mg | ORAL_TABLET | Freq: Four times a day (QID) | ORAL | Status: DC | PRN

## 2015-07-10 MED ORDER — IPRATROPIUM-ALBUTEROL 0.5-2.5 (3) MG/3ML IN SOLN
3.0000 mL | RESPIRATORY_TRACT | Status: DC
  Administered 2015-07-11 – 2015-07-13 (×15): 3 mL via RESPIRATORY_TRACT
  Filled 2015-07-10 (×15): qty 3

## 2015-07-10 MED ORDER — LOSARTAN POTASSIUM 50 MG PO TABS
50.0000 mg | ORAL_TABLET | Freq: Every day | ORAL | Status: DC
  Administered 2015-07-11 – 2015-07-13 (×3): 50 mg via ORAL
  Filled 2015-07-10 (×3): qty 1

## 2015-07-10 MED ORDER — IPRATROPIUM-ALBUTEROL 0.5-2.5 (3) MG/3ML IN SOLN
RESPIRATORY_TRACT | Status: AC
  Administered 2015-07-10: 3 mL via RESPIRATORY_TRACT
  Filled 2015-07-10: qty 3

## 2015-07-10 MED ORDER — MONTELUKAST SODIUM 10 MG PO TABS
10.0000 mg | ORAL_TABLET | Freq: Every day | ORAL | Status: DC
  Administered 2015-07-11 – 2015-07-13 (×3): 10 mg via ORAL
  Filled 2015-07-10 (×3): qty 1

## 2015-07-10 MED ORDER — MAGNESIUM SULFATE 2 GM/50ML IV SOLN
2.0000 g | Freq: Once | INTRAVENOUS | Status: AC
  Administered 2015-07-10: 2 g via INTRAVENOUS
  Filled 2015-07-10: qty 50

## 2015-07-10 MED ORDER — LORATADINE 10 MG PO TABS
10.0000 mg | ORAL_TABLET | Freq: Every day | ORAL | Status: DC
  Administered 2015-07-11 – 2015-07-13 (×3): 10 mg via ORAL
  Filled 2015-07-10 (×3): qty 1

## 2015-07-10 MED ORDER — DEXTROSE 5 % IV SOLN
500.0000 mg | INTRAVENOUS | Status: DC
  Administered 2015-07-10 – 2015-07-11 (×2): 500 mg via INTRAVENOUS
  Filled 2015-07-10 (×2): qty 500

## 2015-07-10 MED ORDER — SODIUM CHLORIDE 0.9% FLUSH
3.0000 mL | Freq: Two times a day (BID) | INTRAVENOUS | Status: DC
  Administered 2015-07-10 – 2015-07-13 (×6): 3 mL via INTRAVENOUS

## 2015-07-10 MED ORDER — LOSARTAN POTASSIUM-HCTZ 50-12.5 MG PO TABS: 1.0000 | ORAL_TABLET | Freq: Every day | ORAL | Status: DC

## 2015-07-10 MED ORDER — ENOXAPARIN SODIUM 40 MG/0.4ML ~~LOC~~ SOLN
40.0000 mg | Freq: Two times a day (BID) | SUBCUTANEOUS | Status: DC
  Administered 2015-07-11 – 2015-07-12 (×4): 40 mg via SUBCUTANEOUS
  Filled 2015-07-10 (×4): qty 0.4

## 2015-07-10 MED ORDER — IPRATROPIUM-ALBUTEROL 0.5-2.5 (3) MG/3ML IN SOLN: 3.0000 mL | RESPIRATORY_TRACT | Status: DC | PRN

## 2015-07-10 MED ORDER — HYDROCHLOROTHIAZIDE 12.5 MG PO CAPS
12.5000 mg | ORAL_CAPSULE | Freq: Every day | ORAL | Status: DC
  Administered 2015-07-11 – 2015-07-13 (×3): 12.5 mg via ORAL
  Filled 2015-07-10 (×3): qty 1

## 2015-07-10 NOTE — ED Provider Notes (Addendum)
[facility] Emergency Department Provider Note   ____________________________________________  Time seen: ~1800  I have reviewed the triage vital signs and the nursing notes.   HISTORY  Chief Complaint Shortness of Breath   History limited by: Not Limited   HPI Nancy Patton is a 55 y.o. female with history of asthma who presents to the emergency department today because of concerns for shortness breath. The patient states that she is becoming more short of breath over the past couple of days. She states this has been associated with a productive cough, productive of yellow phlegm. She denies any hemoptysis. She has been trying her inhalers at home without any relief. No fevers. Some chest tightness.   Past Medical History  Diagnosis Date  . Asthma   . Hypertension   . Diabetes mellitus without complication ([place])   . Obesity   . Pulmonary hypertension [facility])     Patient Active Problem List   Diagnosis Date Noted  . Acute respiratory distress (HCC) 09/26/2014  . Asthmatic bronchitis with acute exacerbation 09/26/2014  . Asthmatic bronchitis with exacerbation 09/26/2014    History reviewed. No pertinent past surgical history.  Current Outpatient Rx  Name  Route  Sig  Dispense  Refill  . albuterol (PROVENTIL HFA;VENTOLIN HFA) 108 (90 BASE) MCG/ACT inhaler      Inhale 4-6 puffs by mouth every 4 hours as needed for wheezing, cough, and/or shortness of breath   1 Inhaler   1   . albuterol (PROVENTIL HFA;VENTOLIN HFA) 108 (90 BASE) MCG/ACT inhaler   Inhalation   Inhale 2 puffs into the lungs every 6 (six) hours as needed for wheezing or shortness of breath.         Marland Kitchen azithromycin (ZITHROMAX) 250 MG tablet      Take 500 mg PO (2 tabs) Day 1 Then take 250 mg PO daily   6 each   0   . BREO ELLIPTA 200-25 MCG/INH AEPB   Inhalation   Inhale 1 puff into the lungs daily.           Dispense as written.   . cetirizine (ZYRTEC) 10 MG  tablet   Oral   Take 1 tablet (10 mg total) by mouth daily.   30 tablet   0   . citalopram (CELEXA) 20 MG tablet   Oral   Take 20 mg by mouth daily.      10   . glimepiride (AMARYL) 2 MG tablet   Oral   Take 2 mg by mouth 2 (two) times daily.      3   . levalbuterol (XOPENEX) 1.25 MG/3ML nebulizer solution   Nebulization   Take 3 mLs by nebulization 3 (three) times daily.      6   . losartan-hydrochlorothiazide (HYZAAR) 50-12.5 MG per tablet   Oral   Take 1 tablet by mouth daily.      5   . metFORMIN (GLUCOPHAGE) 1000 MG tablet   Oral   Take 1,000 mg by mouth 2 (two) times daily.      2   . montelukast (SINGULAIR) 10 MG tablet   Oral   Take 10 mg by mouth daily.         . predniSONE (DELTASONE) 20 MG tablet   Oral   Take 2 tablets (40 mg total) by mouth daily.   8 tablet   0     Allergies Review of patient's allergies indicates no known allergies.  No family history on  file.  Social History Social History  Substance Use Topics  . Smoking status: Former Research scientist (life sciences)  . Smokeless tobacco: None  . Alcohol Use: No     Comment: 2nd hand smoke    Review of Systems  Constitutional: Negative for fever. Cardiovascular: Negative for chest pain.Positive for chest tightness Respiratory: Positive for shortness of breath. Gastrointestinal: Negative for abdominal pain, vomiting and diarrhea. Neurological: Negative for headaches, focal weakness or numbness.  10-point ROS otherwise negative.  ____________________________________________   PHYSICAL EXAM:  VITAL SIGNS: ED Triage Vitals  Enc Vitals Group     BP 07/10/15 1641 143/82 mmHg     Pulse Rate 07/10/15 1641 104     Resp 07/10/15 1641 20     Temp 07/10/15 1641 98.8 F (37.1 C)     Temp Source 07/10/15 1641 Oral     SpO2 07/10/15 1641 91 %     Weight 07/10/15 1641 285 lb (129.275 kg)     Height 07/10/15 1641 5\' 7"  (1.702 m)     Head Cir --      Peak Flow --      Pain Score 07/10/15 1642 6    Constitutional: Alert and oriented. Well appearing and in no distress. Eyes: Conjunctivae are normal. PERRL. Normal extraocular movements. ENT   Head: Normocephalic and atraumatic.   Nose: No congestion/rhinnorhea.   Mouth/Throat: Mucous membranes are moist.   Neck: No stridor. Hematological/Lymphatic/Immunilogical: No cervical lymphadenopathy. Cardiovascular: Normal rate, regular rhythm.  No murmurs, rubs, or gallops. Respiratory: Slightly increased respiratory effort. Diffuse expiratory wheezing. Gastrointestinal: Soft and nontender. No distention. There is no CVA tenderness. Genitourinary: Deferred Musculoskeletal: Normal range of motion in all extremities. No joint effusions.  No lower extremity tenderness nor edema. Neurologic:  Normal speech and language. No gross focal neurologic deficits are appreciated.  Skin:  Skin is warm, dry and intact. No rash noted. Psychiatric: Mood and affect are normal. Speech and behavior are normal. Patient exhibits appropriate insight and judgment.  ____________________________________________    LABS (pertinent positives/negatives)  Labs Reviewed  CBC - Abnormal; Notable for the following:    RDW 14.9 (*)    All other components within normal limits  COMPREHENSIVE METABOLIC PANEL - Abnormal; Notable for the following:    Glucose, Bld 221 (*)    All other components within normal limits  TROPONIN I     ____________________________________________   EKG  None  ____________________________________________    RADIOLOGY  CXR IMPRESSION: 1. Stable mild to moderate changes of chronic bronchitis and/or asthma. 2. Mild linear atelectasis in the left lower lobe. No acute cardiopulmonary disease otherwise.   ____________________________________________   PROCEDURES  Procedure(s) performed: None  Critical Care performed: No  ____________________________________________   INITIAL IMPRESSION / ASSESSMENT AND  PLAN / ED COURSE  Pertinent labs & imaging results that were available during my care of the patient were reviewed by me and considered in my medical decision making (see chart for details).  Patient with history of asthma who presents to the emergency department today because of concerns for worsening shortness of breath. Patient does have a history of asthma and on exam has diffuse 6. Wheezing. She states she did feel somewhat better after one DuoNeb treatment. Will continue to give DuoNeb treatments. Solu-Medrol. Additionally will try magnesium.  ----------------------------------------- 8:31 PM on 07/10/2015 -----------------------------------------  Patient states she feels much better. On repeat auscultation patient lungs are much more clear. Patient states she feels like she is improved to the point where she can manage  it at the house. Will discharge home with prednisone.  ----------------------------------------- 9:08 PM on 07/10/2015 -----------------------------------------  After discharge paperwork was prepared the patient started to desat into the mid and high 80s. Because of this and because she also desatted on ambulation the patient will be admitted to the hospital service.  ____________________________________________   FINAL CLINICAL IMPRESSION(S) / ED DIAGNOSES  Final diagnoses:  Asthma, unspecified asthma severity, with acute exacerbation     Note: This dictation was prepared with Dragon dictation. Any transcriptional errors that result from this process are unintentional    Nance Pear, MD 07/10/15 2032  Nance Pear, MD 07/10/15 2109

## 2015-07-10 NOTE — Discharge Instructions (Signed)
Please seek medical attention for any high fevers, chest pain, shortness of breath, change in behavior, persistent vomiting, bloody stool or any other new or concerning symptoms. ° ° °Asthma, Acute Bronchospasm °Acute bronchospasm caused by asthma is also referred to as an asthma attack. Bronchospasm means your air passages become narrowed. The narrowing is caused by inflammation and tightening of the muscles in the air tubes (bronchi) in your lungs. This can make it hard to breathe or cause you to wheeze and cough. °CAUSES °Possible triggers are: °· Animal dander from the skin, hair, or feathers of animals. °· Dust mites contained in house dust. °· Cockroaches. °· Pollen from trees or grass. °· Mold. °· Cigarette or tobacco smoke. °· Air pollutants such as dust, household cleaners, hair sprays, aerosol sprays, paint fumes, strong chemicals, or strong odors. °· Cold air or weather changes. Cold air may trigger inflammation. Winds increase molds and pollens in the air. °· Strong emotions such as crying or laughing hard. °· Stress. °· Certain medicines such as aspirin or beta-blockers. °· Sulfites in foods and drinks, such as dried fruits and wine. °· Infections or inflammatory conditions, such as a flu, cold, or inflammation of the nasal membranes (rhinitis). °· Gastroesophageal reflux disease (GERD). GERD is a condition where stomach acid backs up into your esophagus. °· Exercise or strenuous activity. °SIGNS AND SYMPTOMS  °· Wheezing. °· Excessive coughing, particularly at night. °· Chest tightness. °· Shortness of breath. °DIAGNOSIS  °Your health care provider will ask you about your medical history and perform a physical exam. A chest X-ray or blood testing may be performed to look for other causes of your symptoms or other conditions that may have triggered your asthma attack.  °TREATMENT  °Treatment is aimed at reducing inflammation and opening up the airways in your lungs.  Most asthma attacks are treated with  inhaled medicines. These include quick relief or rescue medicines (such as bronchodilators) and controller medicines (such as inhaled corticosteroids). These medicines are sometimes given through an inhaler or a nebulizer. Systemic steroid medicine taken by mouth or given through an IV tube also can be used to reduce the inflammation when an attack is moderate or severe. Antibiotic medicines are only used if a bacterial infection is present.  °HOME CARE INSTRUCTIONS  °· Rest. °· Drink plenty of liquids. This helps the mucus to remain thin and be easily coughed up. Only use caffeine in moderation and do not use alcohol until you have recovered from your illness. °· Do not smoke. Avoid being exposed to secondhand smoke. °· You play a critical role in keeping yourself in good health. Avoid exposure to things that cause you to wheeze or to have breathing problems. °· Keep your medicines up-to-date and available. Carefully follow your health care provider's treatment plan. °· Take your medicine exactly as prescribed. °· When pollen or pollution is bad, keep windows closed and use an air conditioner or go to places with air conditioning. °· Asthma requires careful medical care. See your health care provider for a follow-up as advised. If you are more than [redacted] weeks pregnant and you were prescribed any new medicines, let your obstetrician know about the visit and how you are doing. Follow up with your health care provider as directed. °· After you have recovered from your asthma attack, make an appointment with your outpatient doctor to talk about ways to reduce the likelihood of future attacks. If you do not have a doctor who manages your asthma, make an appointment with   a primary care doctor to discuss your asthma. °SEEK IMMEDIATE MEDICAL CARE IF:  °· You are getting worse. °· You have trouble breathing. If severe, call your local emergency services (911 in the [place] °· You develop chest pain or discomfort. °· You are  vomiting. °· You are not able to keep fluids down. °· You are coughing up yellow, green, brown, or bloody sputum. °· You have a fever and your symptoms suddenly get worse. °· You have trouble swallowing. °MAKE SURE YOU:  °· Understand these instructions. °· Will watch your condition. °· Will get help right away if you are not doing well or get worse. °  °This information is not intended to replace advice given to you by your health care provider. Make sure you discuss any questions you have with your health care provider. °  °Document Released: 05/24/2006 Document Revised: 02/11/2013 Document Reviewed: 08/14/2012 °Elsevier Interactive Patient Education ©2016 [employer] ° °

## 2015-07-10 NOTE — ED Notes (Signed)
Minimal relief with breathing treatment, 02 3l applied.

## 2015-07-10 NOTE — ED Notes (Signed)
Increased SOB x 2 days, history of asthma, took treatment before coming however still has audible wheeze.

## 2015-07-10 NOTE — ED Notes (Signed)
MD Goodman at bedside. 

## 2015-07-10 NOTE — ED Notes (Signed)
MD Archie Balboa informed of Pt walking trial

## 2015-07-10 NOTE — H&P (Signed)
[facility] [place] NAME: Nancy Patton    MR#:  [id number]  DATE OF BIRTH:  Jun 27, 1960  DATE OF ADMISSION:  07/10/2015  PRIMARY CARE PHYSICIAN: Perrin Maltese, MD   REQUESTING/REFERRING PHYSICIAN: Archie Balboa, MD  CHIEF COMPLAINT:   Chief Complaint  Patient presents with  . Shortness of Breath    HISTORY OF PRESENT ILLNESS:  Nancy Patton  is a 55 y.o. female who presents with Asthma exacerbation. Patient states that she has been hospitalized for the same before, and frequently so in the past. She states that typically her flares we'll start with an upper respiratory infection, limb left anterior chest, and then trigger her asthma exacerbation. She states that about 6 days ago she began with upper respiratory symptoms and congestion, and that for the past 2 days she's had increasing wheezing, chest tightness, shortness of breath. Today she was unable to catch her breath and so she came to the ED for evaluation. Workup here was largely negative in terms of labs and imaging, but clinically consistent with acute asthma exacerbation. She was hypoxic when she first arrived. She received standard asthma exacerbation treatment in the ED with some improvement in her symptoms, however when she got up to walk her oxygen saturation still dropped to the mid to low 80s. Hospitals were called for admission.  PAST MEDICAL HISTORY:   Past Medical History  Diagnosis Date  . Asthma   . Hypertension   . Diabetes mellitus without complication ([place])   . Obesity   . Pulmonary hypertension ([place])     PAST SURGICAL HISTORY:   Past Surgical History  Procedure Laterality Date  . Cesarean section    . Cataract extraction      SOCIAL HISTORY:   Social History  Substance Use Topics  . Smoking status: Former Research scientist (life sciences)  . Smokeless tobacco: Not on file  . Alcohol Use: No     Comment: 2nd hand smoke    FAMILY HISTORY:   Family History  Problem Relation  Age of Onset  . Cancer Neg Hx   . Diabetes Neg Hx   . Heart failure Neg Hx   . Hypertension Neg Hx     DRUG ALLERGIES:  No Known Allergies  MEDICATIONS AT HOME:   Prior to Admission medications   Medication Sig Start Date End Date Taking? Authorizing Provider  albuterol (PROVENTIL HFA;VENTOLIN HFA) 108 (90 BASE) MCG/ACT inhaler Inhale 4-6 puffs by mouth every 4 hours as needed for wheezing, cough, and/or shortness of breath 07/08/14   Hinda Kehr, MD  albuterol (PROVENTIL HFA;VENTOLIN HFA) 108 (90 BASE) MCG/ACT inhaler Inhale 2 puffs into the lungs every 6 (six) hours as needed for wheezing or shortness of breath.    Historical Provider, MD  azithromycin (ZITHROMAX) 250 MG tablet Take 500 mg PO (2 tabs) Day 1 Then take 250 mg PO daily 01/15/15   Nance Pear, MD  BREO ELLIPTA 200-25 MCG/INH AEPB Inhale 1 puff into the lungs daily. 09/14/14   Historical Provider, MD  cetirizine (ZYRTEC) 10 MG tablet Take 1 tablet (10 mg total) by mouth daily. 07/08/14   Hinda Kehr, MD  citalopram (CELEXA) 20 MG tablet Take 20 mg by mouth daily. 09/15/14   Historical Provider, MD  glimepiride (AMARYL) 2 MG tablet Take 2 mg by mouth 2 (two) times daily. 09/15/14   Historical Provider, MD  levalbuterol (XOPENEX) 1.25 MG/3ML nebulizer solution Take 3 mLs by nebulization 3 (three) times daily. 09/18/14  Historical Provider, MD  losartan-hydrochlorothiazide (HYZAAR) 50-12.5 MG per tablet Take 1 tablet by mouth daily. 09/15/14   Historical Provider, MD  metFORMIN (GLUCOPHAGE) 1000 MG tablet Take 1,000 mg by mouth 2 (two) times daily. 09/09/14   Historical Provider, MD  montelukast (SINGULAIR) 10 MG tablet Take 10 mg by mouth daily. 09/14/14   Historical Provider, MD  predniSONE (DELTASONE) 20 MG tablet Take 2 tablets (40 mg total) by mouth daily. 01/15/15 01/15/16  Nance Pear, MD  predniSONE (DELTASONE) 20 MG tablet Take 2 tablets (40 mg total) by mouth daily. 07/10/15   Nance Pear, MD    REVIEW OF  SYSTEMS:  Review of Systems  Constitutional: Negative for fever, chills, weight loss and malaise/fatigue.  HENT: Negative for ear pain, hearing loss and tinnitus.   Eyes: Negative for blurred vision, double vision, pain and redness.  Respiratory: Positive for cough, sputum production and shortness of breath. Negative for hemoptysis.   Cardiovascular: Negative for chest pain, palpitations, orthopnea and leg swelling.  Gastrointestinal: Negative for nausea, vomiting, abdominal pain, diarrhea and constipation.  Genitourinary: Negative for dysuria, frequency and hematuria.  Musculoskeletal: Negative for back pain, joint pain and neck pain.  Skin:       No acne, rash, or lesions  Neurological: Negative for dizziness, tremors, focal weakness and weakness.  Endo/Heme/Allergies: Negative for polydipsia. Does not bruise/bleed easily.  Psychiatric/Behavioral: Negative for depression. The patient is not nervous/anxious and does not have insomnia.      VITAL SIGNS:   Filed Vitals:   07/10/15 1910 07/10/15 1930 07/10/15 2020 07/10/15 2035  BP: 130/76 135/68 137/65 126/66  Pulse: 80 88 80 84  Temp:    98.7 F (37.1 C)  TempSrc:    Oral  Resp: 18 22 18 15   Height:      Weight:      SpO2: 97% 93% 95% 93%   Wt Readings from Last 3 Encounters:  07/10/15 129.275 kg (285 lb)  01/15/15 129.275 kg (285 lb)  09/30/14 138.075 kg (304 lb 6.4 oz)    PHYSICAL EXAMINATION:  Physical Exam  Vitals reviewed. Constitutional: She is oriented to person, place, and time. She appears well-developed and well-nourished. No distress.  HENT:  Head: Normocephalic and atraumatic.  Mouth/Throat: Oropharynx is clear and moist.  Eyes: Conjunctivae and EOM are normal. Pupils are equal, round, and reactive to light. No scleral icterus.  Neck: Normal range of motion. Neck supple. No JVD present. No thyromegaly present.  Cardiovascular: Normal rate, regular rhythm and intact distal pulses.  Exam reveals no gallop and  no friction rub.   No murmur heard. Respiratory: Effort normal. No respiratory distress. She has wheezes (diffuse, bilateral, inspiratory and expiratory). She has no rales.  GI: Soft. Bowel sounds are normal. She exhibits no distension. There is no tenderness.  Musculoskeletal: Normal range of motion. She exhibits no edema.  No arthritis, no gout  Lymphadenopathy:    She has no cervical adenopathy.  Neurological: She is alert and oriented to person, place, and time. No cranial nerve deficit.  No dysarthria, no aphasia  Skin: Skin is warm and dry. No rash noted. No erythema.  Psychiatric: She has a normal mood and affect. Her behavior is normal. Judgment and thought content normal.    LABORATORY PANEL:   CBC  Recent Labs Lab 07/10/15 1651  WBC 8.8  HGB 13.3  HCT 39.2  PLT 225   ------------------------------------------------------------------------------------------------------------------  Chemistries   Recent Labs Lab 07/10/15 1651  NA 138  K 3.6  CL 105  CO2 24  GLUCOSE 221*  BUN 12  CREATININE 0.83  CALCIUM 9.5  AST 37  ALT 43  ALKPHOS 73  BILITOT 0.6   ------------------------------------------------------------------------------------------------------------------  Cardiac Enzymes  Recent Labs Lab 07/10/15 1651  TROPONINI <0.03   ------------------------------------------------------------------------------------------------------------------  RADIOLOGY:  Dg Chest 2 View  07/10/2015  CLINICAL DATA:  55 year old presenting with 3 day history of persistent cough, shortness of breath and upper back pain. Current history of asthma, hypertension and diabetes. Former smoker. EXAM: CHEST  2 VIEW COMPARISON:  01/15/2015 and earlier. FINDINGS: Cardiac silhouette normal in size, unchanged. Thoracic aorta mildly tortuous, unchanged. Hilar and mediastinal contours otherwise unremarkable. Prominent bronchovascular markings diffusely and mild central peribronchial  thickening, unchanged. Mild linear atelectasis in the left lower lobe. Lungs otherwise clear. No localized airspace consolidation. No pleural effusions. No pneumothorax. Normal pulmonary vascularity. 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 IMPRESSION: 1. Stable mild to moderate changes of chronic bronchitis and/or asthma. 2. Mild linear atelectasis in the left lower lobe. No acute cardiopulmonary disease otherwise. Electronically Signed   By: Evangeline Dakin M.D.   On: 07/10/2015 17:54    EKG:   Orders placed or performed during the hospital encounter of 01/15/15  . EKG 12-Lead  . EKG 12-Lead  . EKG    IMPRESSION AND PLAN:  Principal Problem:   Asthmatic bronchitis with acute exacerbation - IV Solu-Medrol, DuoNeb scheduled and when necessary, continue home inhalers, continue supplemental oxygen for now, patient has had diagnosis of asthma for a long time but also is a former smoker, so there may be some overlapping COPD here, I will add azithromycin for the same. Active Problems:   HTN (hypertension) - currently stable, continue home meds   Type 2 diabetes mellitus (HCC) - sliding scale insulin with corresponding glucose checks and a heart healthy/carb modified diet   Depression - continue home SSRI  All the records are reviewed and case discussed with ED provider. Management plans discussed with the patient and/or family.  DVT PROPHYLAXIS: SubQ lovenox  GI PROPHYLAXIS: None  ADMISSION STATUS: Inpatient  CODE STATUS: Full Code Status History    Date Active Date Inactive Code Status Order ID Comments User Context   09/26/2014 12:30 PM 09/30/2014  2:42 PM Full Code [id number]  Idelle Crouch, MD Inpatient      TOTAL TIME TAKING CARE OF THIS PATIENT: 45 minutes.    Tyrek Lawhorn Buckley 07/10/2015, 9:16 PM  [facility]  Office  [phone]  CC: Primary care physician; Perrin Maltese, MD

## 2015-07-10 NOTE — ED Notes (Signed)
Pt O2 sat decreased to 87-89% on RA when taken of O2. MD Archie Balboa informed. MD ordered to walk patient. Pt became slightly dyspneic, and O2 sat went as high as 96 after coughing, and low to 87% on RA. Pt currently at 93% on RA sitting in bed

## 2015-07-11 LAB — BASIC METABOLIC PANEL
Anion gap: 9 (ref 5–15)
BUN: 15 mg/dL (ref 6–20)
CO2: 22 mmol/L (ref 22–32)
Calcium: 9 mg/dL (ref 8.9–10.3)
Chloride: 104 mmol/L (ref 101–111)
Creatinine, Ser: 0.94 mg/dL (ref 0.44–1.00)
GFR calc Af Amer: >60 mL/min (ref >60)
GFR calc non Af Amer: >60 mL/min (ref >60)
Glucose, Bld: 406 mg/dL — ABNORMAL HIGH (ref 65–99)
Potassium: 4 mmol/L (ref 3.5–5.1)
Sodium: 135 mmol/L (ref 135–145)

## 2015-07-11 LAB — CBC
HCT: 38.4 % (ref 35.0–47.0)
Hemoglobin: 12.9 g/dL (ref 12.0–16.0)
MCH: 28.6 pg (ref 26.0–34.0)
MCHC: 33.5 g/dL (ref 32.0–36.0)
MCV: 85.4 fL (ref 80.0–100.0)
Platelets: 207 10*3/uL (ref 150–440)
RBC: 4.5 MIL/uL (ref 3.80–5.20)
RDW: 14.8 % — ABNORMAL HIGH (ref 11.5–14.5)
WBC: 7.6 10*3/uL (ref 3.6–11.0)

## 2015-07-11 LAB — GLUCOSE, CAPILLARY
Glucose-Capillary: 340 mg/dL — ABNORMAL HIGH (ref 65–99)
Glucose-Capillary: 292 mg/dL — ABNORMAL HIGH (ref 65–99)
Glucose-Capillary: 276 mg/dL — ABNORMAL HIGH (ref 65–99)
Glucose-Capillary: 349 mg/dL — ABNORMAL HIGH (ref 65–99)

## 2015-07-11 LAB — HEMOGLOBIN A1C: Hgb A1c MFr Bld: 7.7 % — ABNORMAL HIGH (ref 4.0–6.0)

## 2015-07-11 MED ORDER — INSULIN GLARGINE 100 UNIT/ML ~~LOC~~ SOLN
10.0000 [IU] | Freq: Every day | SUBCUTANEOUS | Status: DC
  Administered 2015-07-11 – 2015-07-12 (×2): 10 [IU] via SUBCUTANEOUS
  Filled 2015-07-11 (×2): qty 0.1

## 2015-07-11 MED ORDER — METFORMIN HCL 500 MG PO TABS
1000.0000 mg | ORAL_TABLET | Freq: Two times a day (BID) | ORAL | Status: DC
  Administered 2015-07-11 – 2015-07-13 (×5): 1000 mg via ORAL
  Filled 2015-07-11 (×5): qty 2

## 2015-07-11 MED ORDER — OFLOXACIN 0.3 % OP SOLN
1.0000 [drp] | Freq: Four times a day (QID) | OPHTHALMIC | Status: DC
  Administered 2015-07-11 – 2015-07-13 (×6): 1 [drp] via OPHTHALMIC
  Filled 2015-07-11: qty 5

## 2015-07-11 MED ORDER — PREDNISOLONE ACETATE 0.12 % OP SUSP
1.0000 [drp] | Freq: Four times a day (QID) | OPHTHALMIC | Status: DC
  Administered 2015-07-11 – 2015-07-13 (×6): 1 [drp] via OPHTHALMIC
  Filled 2015-07-11: qty 5

## 2015-07-11 MED ORDER — KETOROLAC TROMETHAMINE 0.5 % OP SOLN
1.0000 [drp] | Freq: Three times a day (TID) | OPHTHALMIC | Status: DC
  Administered 2015-07-11 – 2015-07-13 (×5): 1 [drp] via OPHTHALMIC
  Filled 2015-07-11: qty 3

## 2015-07-11 NOTE — Progress Notes (Signed)
Dr. Tressia Miners was contacted by telephone and notified that patient has had recent cataract surgery and is asking about having the following eye drops ordered: Levofloxacin 0.5% one drop in the right eye four times daily, prednisone acetate one drop both eyes four times daily and flurbiprofen 0.3% one drop in both eyes three times daily.

## 2015-07-11 NOTE — Progress Notes (Signed)
[facility] at [place] NAME: Nancy Patton    MR#:  [id number]  DATE OF BIRTH:  12-27-1960  SUBJECTIVE:  CHIEF COMPLAINT:   Chief Complaint  Patient presents with  . Shortness of Breath   -Admitted with asthma exacerbation. Continues to need 2 L oxygen. Not on any home oxygen. -Still feels tight and has diffuse wheezing  REVIEW OF SYSTEMS:  Review of Systems  Constitutional: Negative for fever, chills and malaise/fatigue.  HENT: Negative for ear discharge, ear pain and nosebleeds.   Eyes: Negative for blurred vision and double vision.  Respiratory: Positive for cough, shortness of breath and wheezing.   Cardiovascular: Negative for chest pain, palpitations and leg swelling.  Gastrointestinal: Negative for nausea, vomiting, abdominal pain, diarrhea and constipation.  Genitourinary: Negative for dysuria.  Musculoskeletal: Negative for myalgias.  Neurological: Negative for dizziness, sensory change, speech change, focal weakness, seizures and headaches.  Psychiatric/Behavioral: Negative for depression.    DRUG ALLERGIES:  No Known Allergies  VITALS:  Blood pressure 117/60, pulse 88, temperature 98 F (36.7 C), temperature source Oral, resp. rate 18, height 5\' 7"  (1.702 m), weight 129.275 kg (285 lb), SpO2 95 %.  PHYSICAL EXAMINATION:  Physical Exam  GENERAL:  55 y.o.-year-old Obese patient lying in the bed with no acute distress.  EYES: Pupils equal, round, reactive to light and accommodation. No scleral icterus. Extraocular muscles intact.  HEENT: Head atraumatic, normocephalic. Oropharynx and nasopharynx clear.  NECK:  Supple, no jugular venous distention. No thyroid enlargement, no tenderness.  LUNGS:Diffuse scattered expiratory wheezing all over the lung fields, no rales,rhonchi or crepitation. No use of accessory muscles of respiration.  CARDIOVASCULAR: S1, S2 normal. No murmurs, rubs, or gallops.  ABDOMEN: Soft,  nontender, nondistended. Bowel sounds present. No organomegaly or mass.  EXTREMITIES: No pedal edema, cyanosis, or clubbing.  NEUROLOGIC: Cranial nerves II through XII are intact. Muscle strength 5/5 in all extremities. Sensation intact. Gait not checked.  PSYCHIATRIC: The patient is alert and oriented x 3.  SKIN: No obvious rash, lesion, or ulcer.    LABORATORY PANEL:   CBC  Recent Labs Lab 07/11/15 0421  WBC 7.6  HGB 12.9  HCT 38.4  PLT 207   ------------------------------------------------------------------------------------------------------------------  Chemistries   Recent Labs Lab 07/10/15 1651 07/11/15 0421  NA 138 135  K 3.6 4.0  CL 105 104  CO2 24 22  GLUCOSE 221* 406*  BUN 12 15  CREATININE 0.83 0.94  CALCIUM 9.5 9.0  AST 37  --   ALT 43  --   ALKPHOS 73  --   BILITOT 0.6  --    ------------------------------------------------------------------------------------------------------------------  Cardiac Enzymes  Recent Labs Lab 07/10/15 1651  TROPONINI <0.03   ------------------------------------------------------------------------------------------------------------------  RADIOLOGY:  Dg Chest 2 View  07/10/2015  CLINICAL DATA:  55 year old presenting with 3 day history of persistent cough, shortness of breath and upper back pain. Current history of asthma, hypertension and diabetes. Former smoker. EXAM: CHEST  2 VIEW COMPARISON:  01/15/2015 and earlier. FINDINGS: Cardiac silhouette normal in size, unchanged. Thoracic aorta mildly tortuous, unchanged. Hilar and mediastinal contours otherwise unremarkable. Prominent bronchovascular markings diffusely and mild central peribronchial thickening, unchanged. Mild linear atelectasis in the left lower lobe. Lungs otherwise clear. No localized airspace consolidation. No pleural effusions. No pneumothorax. Normal pulmonary vascularity. 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  IMPRESSION: 1. Stable mild to moderate changes of chronic bronchitis and/or asthma. 2. Mild linear atelectasis in the left lower lobe.  No acute cardiopulmonary disease otherwise. Electronically Signed   By: Evangeline Dakin M.D.   On: 07/10/2015 17:54    EKG:   Orders placed or performed during the hospital encounter of 01/15/15  . EKG 12-Lead  . EKG 12-Lead  . EKG    ASSESSMENT AND PLAN:   55 year old female with past medical history significant for hypertension, diabetes, asthma presents to the hospital secondary to asthma exacerbation.  #1 acute on chronic asthma exacerbation-usually triggered by upper respiratory tract infections -Continue 2 L oxygen support for now. -Continue Solu-Medrol every 6 hours -On azithromycin. -Received magnesium in the ER.  #2 hypertension-on losartan and hydrochlorothiazide.  #3 diabetes mellitus-hyperglycemia now secondary to high-dose steroids. -Added low-dose Lantus. Also on sliding scale insulin. -Continue home medications-at [facility]  #4 depression-continue Celexa  #5 DVT prophylaxis-on Lovenox    All the records are reviewed and case discussed with Care Management/Social Workerr. Management plans discussed with the patient, family and they are in agreement.  CODE STATUS: Full code  TOTAL TIME TAKING CARE OF THIS PATIENT: 37 minutes.   POSSIBLE D/C IN 2 DAYS, DEPENDING ON CLINICAL CONDITION.   Gladstone Lighter M.D on 07/11/2015 at 11:50 AM  Between 7am to 6pm - Pager - [phone]  After 6pm go to [email or web address] - password [email or web address] [facility]  Office  [phone]  CC: Primary care physician; Perrin Maltese, MD

## 2015-07-12 LAB — GLUCOSE, CAPILLARY
Glucose-Capillary: 337 mg/dL — ABNORMAL HIGH (ref 65–99)
Glucose-Capillary: 321 mg/dL — ABNORMAL HIGH (ref 65–99)
Glucose-Capillary: 254 mg/dL — ABNORMAL HIGH (ref 65–99)
Glucose-Capillary: 241 mg/dL — ABNORMAL HIGH (ref 65–99)

## 2015-07-12 MED ORDER — INSULIN ASPART 100 UNIT/ML ~~LOC~~ SOLN
6.0000 [IU] | Freq: Three times a day (TID) | SUBCUTANEOUS | Status: DC
  Administered 2015-07-12 – 2015-07-13 (×3): 6 [IU] via SUBCUTANEOUS
  Filled 2015-07-12 (×3): qty 6

## 2015-07-12 MED ORDER — AZITHROMYCIN 250 MG PO TABS
500.0000 mg | ORAL_TABLET | Freq: Every day | ORAL | Status: DC
  Administered 2015-07-12: 18:00:00 500 mg via ORAL
  Filled 2015-07-12: qty 2

## 2015-07-12 MED ORDER — AZITHROMYCIN 250 MG PO TABS: 500.0000 mg | ORAL_TABLET | Freq: Every day | ORAL | Status: DC

## 2015-07-12 MED ORDER — METHYLPREDNISOLONE SODIUM SUCC 125 MG IJ SOLR
60.0000 mg | Freq: Two times a day (BID) | INTRAMUSCULAR | Status: DC
  Administered 2015-07-12 – 2015-07-13 (×2): 60 mg via INTRAVENOUS
  Filled 2015-07-12 (×2): qty 2

## 2015-07-12 MED ORDER — INSULIN GLARGINE 100 UNIT/ML ~~LOC~~ SOLN
18.0000 [IU] | Freq: Every day | SUBCUTANEOUS | Status: DC
  Administered 2015-07-12 – 2015-07-13 (×2): 18 [IU] via SUBCUTANEOUS
  Filled 2015-07-12 (×2): qty 0.18

## 2015-07-12 MED ORDER — INSULIN GLARGINE 100 UNIT/ML ~~LOC~~ SOLN: 15.0000 [IU] | Freq: Every day | SUBCUTANEOUS | Status: DC

## 2015-07-12 NOTE — Progress Notes (Signed)
Inpatient Diabetes Program Recommendations  AACE/ADA: New Consensus Statement on Inpatient Glycemic Control (2015)  Target Ranges:  Prepandial:   less than 140 mg/dL      Peak postprandial:   less than 180 mg/dL (1-2 hours)      Critically ill patients:  140 - 180 mg/dL   Review of Glycemic Control:  Results for CHARENE, WROBLESKI (MRN [id number]) as of 07/12/2015 11:39  Ref. Range 07/11/2015 11:45 07/11/2015 16:26 07/11/2015 20:54 07/12/2015 07:25 07/12/2015 11:01  Glucose-Capillary Latest Ref Range: 65-99 mg/dL 292 (H) 276 (H) 349 (H) 337 (H) 321 (H)   Diabetes history: Type 2 diabetes Outpatient Diabetes medications: Amaryl 2 mg bid, Metformin 1000 mg bid Current orders for Inpatient glycemic control:  Lantus 18 units daily, Novolog sensitive tid with meals and HS, Metformin 1000 mg bid  Inpatient Diabetes Program Recommendations:    While on steroids, please consider adding Novolog 5 units tid with meals (hold if patient eats less than 50%).  Thanks, Adah Perl, RN, BC-ADM Inpatient Diabetes Coordinator Pager [phone] (8a-5p)

## 2015-07-12 NOTE — Clinical Documentation Improvement (Signed)
Internal Medicine  Please provide frequency of patients "severe asthma exacerbation"  Thank you   *Frequency:  mild intermittent Persistent mild persistent moderate persistent severe persistent.  Other Clinically Undetermined  Supporting Information:    Please exercise your independent, professional judgment when responding. A specific answer is not anticipated or expected.   Thank You, [place] [phone]

## 2015-07-12 NOTE — Progress Notes (Signed)
PHARMACIST - PHYSICIAN COMMUNICATION DR:   [place] CONCERNING: Antibiotic IV to Oral Route Change Policy  RECOMMENDATION: This patient is receiving azithromycin by the intravenous route.  Based on criteria approved by the Pharmacy and Therapeutics Committee, the antibiotic(s) is/are being converted to the equivalent oral dose form(s).   DESCRIPTION: These criteria include:  Patient being treated for a respiratory tract infection, urinary tract infection, cellulitis or clostridium difficile associated diarrhea if on metronidazole  The patient is not neutropenic and does not exhibit a GI malabsorption state  The patient is eating (either orally or via tube) and/or has been taking other orally administered medications for a least 24 hours  The patient is improving clinically and has a Tmax < 100.5  If you have questions about this conversion, please contact the Pharmacy Department  []  ( [phone] )  [facility] [x]  ( [phone] )  [facility] []  ( [phone] )  [facility] []  ( [phone] )  [facility] []  ( [phone] )  [facility]   

## 2015-07-12 NOTE — Progress Notes (Signed)
[facility] [facility] at [place] NAME: Nancy Patton    MR#:  [id number]  DATE OF BIRTH:  03/20/1960  SUBJECTIVE:  CHIEF COMPLAINT:   Chief Complaint  Patient presents with  . Shortness of Breath   -Admitted with asthma exacerbation. Wheezing is improving today. - Continues to need 2 L oxygen. Not on any home oxygen. -Sugars are elevated due to high-dose steroids  REVIEW OF SYSTEMS:  Review of Systems  Constitutional: Negative for fever, chills and malaise/fatigue.  HENT: Negative for ear discharge, ear pain and nosebleeds.   Eyes: Negative for blurred vision and double vision.  Respiratory: Positive for cough, shortness of breath and wheezing.   Cardiovascular: Negative for chest pain, palpitations and leg swelling.  Gastrointestinal: Negative for nausea, vomiting, abdominal pain, diarrhea and constipation.  Genitourinary: Negative for dysuria.  Musculoskeletal: Negative for myalgias.  Neurological: Negative for dizziness, sensory change, speech change, focal weakness, seizures and headaches.  Psychiatric/Behavioral: Negative for depression.    DRUG ALLERGIES:  No Known Allergies  VITALS:  Blood pressure 115/62, pulse 81, temperature 97.6 F (36.4 C), temperature source Oral, resp. rate 18, height 5\' 7"  (1.702 m), weight 129.275 kg (285 lb), SpO2 94 %.  PHYSICAL EXAMINATION:  Physical Exam  GENERAL:  55 y.o.-year-old Obese patient lying in the bed with no acute distress.  EYES: Pupils equal, round, reactive to light and accommodation. No scleral icterus. Extraocular muscles intact.  HEENT: Head atraumatic, normocephalic. Oropharynx and nasopharynx clear.  NECK:  Supple, no jugular venous distention. No thyroid enlargement, no tenderness.  LUNGS:Improving scattered expiratory wheezing all over the lung fields, no rales,rhonchi or crepitation. No use of accessory muscles of respiration.  CARDIOVASCULAR: S1, S2 normal. No murmurs,  rubs, or gallops.  ABDOMEN: Soft, nontender, nondistended. Bowel sounds present. No organomegaly or mass.  EXTREMITIES: No pedal edema, cyanosis, or clubbing.  NEUROLOGIC: Cranial nerves II through XII are intact. Muscle strength 5/5 in all extremities. Sensation intact. Gait not checked.  PSYCHIATRIC: The patient is alert and oriented x 3.  SKIN: No obvious rash, lesion, or ulcer.    LABORATORY PANEL:   CBC  Recent Labs Lab 07/11/15 0421  WBC 7.6  HGB 12.9  HCT 38.4  PLT 207   ------------------------------------------------------------------------------------------------------------------  Chemistries   Recent Labs Lab 07/10/15 1651 07/11/15 0421  NA 138 135  K 3.6 4.0  CL 105 104  CO2 24 22  GLUCOSE 221* 406*  BUN 12 15  CREATININE 0.83 0.94  CALCIUM 9.5 9.0  AST 37  --   ALT 43  --   ALKPHOS 73  --   BILITOT 0.6  --    ------------------------------------------------------------------------------------------------------------------  Cardiac Enzymes  Recent Labs Lab 07/10/15 1651  TROPONINI <0.03   ------------------------------------------------------------------------------------------------------------------  RADIOLOGY:  Dg Chest 2 View  07/10/2015  CLINICAL DATA:  55 year old presenting with 3 day history of persistent cough, shortness of breath and upper back pain. Current history of asthma, hypertension and diabetes. Former smoker. EXAM: CHEST  2 VIEW COMPARISON:  01/15/2015 and earlier. FINDINGS: Cardiac silhouette normal in size, unchanged. Thoracic aorta mildly tortuous, unchanged. Hilar and mediastinal contours otherwise unremarkable. Prominent bronchovascular markings diffusely and mild central peribronchial thickening, unchanged. Mild linear atelectasis in the left lower lobe. Lungs otherwise clear. No localized airspace consolidation. No pleural effusions. No pneumothorax. Normal pulmonary vascularity. Degenerative disc disease and spondylosis  throughout the thoracic spine. IMPRESSION: 1. Stable mild to moderate changes of chronic bronchitis and/or asthma. 2. Mild linear atelectasis  in the left lower lobe. No acute cardiopulmonary disease otherwise. Electronically Signed   By: Evangeline Dakin M.D.   On: 07/10/2015 17:54    EKG:   Orders placed or performed during the hospital encounter of 01/15/15  . EKG 12-Lead  . EKG 12-Lead  . EKG    ASSESSMENT AND PLAN:   55 year old female with past medical history significant for hypertension, diabetes, asthma presents to the hospital secondary to asthma exacerbation.  #1 acute on chronic asthma exacerbation-usually triggered by upper respiratory tract infections -Continue 2 L oxygen support for now. -Continue Solu-Medrol-Changed to every 12 hours -On azithromycin. -Received magnesium in the ER.  #2 hypertension-on losartan and hydrochlorothiazide.  #3 diabetes mellitus-hyperglycemia now secondary to high-dose steroids. Steroids are being tapered -Added Lantus. Also on sliding scale insulin. -A1c 7.7 -Continue home metformin  #4 depression-continue Celexa  #5 DVT prophylaxis-on Lovenox  #6 recent right eye cataract surgery-continue eyedrops   Possible discharge tomorrow if stable    All the records are reviewed and case discussed with Care Management/Social Workerr. Management plans discussed with the patient, family and they are in agreement.  CODE STATUS: Full code  TOTAL TIME TAKING CARE OF THIS PATIENT: 36 minutes.   POSSIBLE D/C IN 1-2 DAYS, DEPENDING ON CLINICAL CONDITION.   Gladstone Lighter M.D on 07/12/2015 at 8:50 AM  Between 7am to 6pm - Pager - [phone]  After 6pm go to [email or web address] - password [email or web address] [facility]  Office  [phone]  CC: Primary care physician; Perrin Maltese, MD

## 2015-07-13 LAB — GLUCOSE, CAPILLARY
Glucose-Capillary: 221 mg/dL — ABNORMAL HIGH (ref 65–99)
Glucose-Capillary: 259 mg/dL — ABNORMAL HIGH (ref 65–99)

## 2015-07-13 MED ORDER — PREDNISONE 10 MG (21) PO TBPK: 10.0000 mg | ORAL_TABLET | Freq: Every day | ORAL | Status: DC

## 2015-07-13 MED ORDER — AZITHROMYCIN 250 MG PO TABS: 250.0000 mg | ORAL_TABLET | Freq: Every day | ORAL | Status: DC

## 2015-07-13 NOTE — Plan of Care (Signed)
Problem: Food- and Nutrition-Related Knowledge Deficit (NB-1.1) Goal: Nutrition education Formal process to instruct or train a patient/client in a skill or to impart knowledge to help patients/clients voluntarily manage or modify food choices and eating behavior to maintain or improve health. Outcome: Completed/Met Date Met:  07/13/15    RD consulted for nutrition education regarding diabetes.     Lab Results  Component Value Date    HGBA1C 7.7* 07/10/2015    RD provided "Carbohydrate Counting for People with Diabetes" handout from the Academy of Nutrition and Dietetics. Discussed different food groups and their effects on blood sugar, emphasizing carbohydrate-containing foods. Provided list of carbohydrates and recommended serving sizes of common foods.  Discussed importance of controlled and consistent carbohydrate intake throughout the day. Provided examples of ways to balance meals/snacks and encouraged intake of high-fiber, whole grain complex carbohydrates. Teach back method used.  Expect good compliance.  Body mass index is 44.63 kg/(m^2). Pt meets criteria for Morbid Obesity based on current BMI.  Current diet order is Heart Healthy/Carb Modified, patient is consuming approximately 100% of meals at this time. Labs and medications reviewed. No further nutrition interventions warranted at this time. RD contact information provided. If additional nutrition issues arise, please re-consult RD.  Dwyane Luo, RD, LDN Pager [phone] Weekend/On-Call Pager [phone]

## 2015-07-13 NOTE — Progress Notes (Signed)
Discharge instructions given and went over with patient at bedside. Prescriptions given. All questions answered. Patient discharged home via wheelchair by nursing staff. Madlyn Frankel, RN

## 2015-07-13 NOTE — Discharge Summary (Signed)
[facility] [place] NAME: Nancy Patton    MR#:  [id number]  DATE OF BIRTH:  1960-10-05  DATE OF ADMISSION:  07/10/2015 ADMITTING PHYSICIAN: Lance Coon, MD  DATE OF DISCHARGE: 07/13/2015  PRIMARY CARE PHYSICIAN: Perrin Maltese, MD    ADMISSION DIAGNOSIS:  Asthma, unspecified asthma severity, with acute exacerbation [J45.901]  DISCHARGE DIAGNOSIS:  Principal Problem:   Asthmatic bronchitis with acute exacerbation Active Problems:   HTN (hypertension)   Type 2 diabetes mellitus ([place])   Depression   SECONDARY DIAGNOSIS:   Past Medical History  Diagnosis Date  . Asthma   . Hypertension   . Diabetes mellitus without complication ([place])   . Obesity   . Pulmonary hypertension [facility])     HOSPITAL COURSE:   55 year old female with past medical history significant for hypertension, diabetes, asthma presents to the hospital secondary to asthma exacerbation.  #1 acute on chronic asthma exacerbation-usually triggered by upper respiratory tract infections -Has been weaned off the oxygen -Was receiving high-dose steroids in the hospital, changed over to oral prednisone -On azithromycin. Finish 5 days course -Outpatient pulmonary follow up. Patient does have moderate persistent asthma -Pulmonary rehabilitation after discharge  #2 hypertension-on losartan and hydrochlorothiazide.  #3 diabetes mellitus-hyperglycemia now secondary to high-dose steroids. Steroids are being tapered -A1c 7.7 -Continue home metformin and Amaryl  #4 depression-continue Celexa  #5 recent right eye cataract surgery-continue eyedrops   Patient is being discharged today   DISCHARGE CONDITIONS:   Stable  CONSULTS OBTAINED:   none  DRUG ALLERGIES:  No Known Allergies  DISCHARGE MEDICATIONS:   Current Discharge Medication List    START taking these medications   Details  azithromycin (ZITHROMAX) 250 MG tablet Take 1 tablet (250 mg total)  by mouth daily. X 3 more days Qty: 3 tablet, Refills: 0    predniSONE (STERAPRED UNI-PAK 21 TAB) 10 MG (21) TBPK tablet Take 1 tablet (10 mg total) by mouth daily. 6 tabs PO x 2 days 5 tabs PO x 2 days 4 tabs PO x 2 days 3 tabs PO x 2 days 2 tabs PO x 2 days 1 tab PO x 2 days and stop Qty: 42 tablet, Refills: 0      CONTINUE these medications which have NOT CHANGED   Details  albuterol (PROVENTIL HFA;VENTOLIN HFA) 108 (90 BASE) MCG/ACT inhaler Inhale 4-6 puffs by mouth every 4 hours as needed for wheezing, cough, and/or shortness of breath Qty: 1 Inhaler, Refills: 1    BREO ELLIPTA 200-25 MCG/INH AEPB Inhale 1 puff into the lungs daily.    citalopram (CELEXA) 20 MG tablet Take 20 mg by mouth daily. Refills: 10    flurbiprofen (OCUFEN) 0.03 % ophthalmic solution Place 1 drop into the right eye 3 (three) times daily. Do not use while wearing contact lenses    glimepiride (AMARYL) 2 MG tablet Take 2 mg by mouth 2 (two) times daily. Refills: 3    levalbuterol (XOPENEX) 1.25 MG/3ML nebulizer solution Take 3 mLs by nebulization 3 (three) times daily. Refills: 6    levofloxacin (QUIXIN) 0.5 % ophthalmic solution Place 1 drop into the right eye 4 (four) times daily.    losartan-hydrochlorothiazide (HYZAAR) 50-12.5 MG per tablet Take 1 tablet by mouth daily. Refills: 5    metFORMIN (GLUCOPHAGE) 1000 MG tablet Take 1,000 mg by mouth 2 (two) times daily. Refills: 2    montelukast (SINGULAIR) 10 MG tablet Take 10 mg by mouth at bedtime.  tiotropium (SPIRIVA) 18 MCG inhalation capsule Place 18 mcg into inhaler and inhale daily.      STOP taking these medications     cetirizine (ZYRTEC) 10 MG tablet      predniSONE (DELTASONE) 20 MG tablet          DISCHARGE INSTRUCTIONS:   1. PCP f/u in 1-2 weeks 2. Pulmonary f/u in 2-3 weeks 3. Outpatient pulmonary rehab recommended  If you experience worsening of your admission symptoms, develop shortness of breath, life threatening  emergency, suicidal or homicidal thoughts you must seek medical attention immediately by calling 911 or calling your MD immediately  if symptoms less severe.  You Must read complete instructions/literature along with all the possible adverse reactions/side effects for all the Medicines you take and that have been prescribed to you. Take any new Medicines after you have completely understood and accept all the possible adverse reactions/side effects.   Please note  You were cared for by a hospitalist during your hospital stay. If you have any questions about your discharge medications or the care you received while you were in the hospital after you are discharged, you can call the unit and asked to speak with the hospitalist on call if the hospitalist that took care of you is not available. Once you are discharged, your primary care physician will handle any further medical issues. Please note that NO REFILLS for any discharge medications will be authorized once you are discharged, as it is imperative that you return to your primary care physician (or establish a relationship with a primary care physician if you do not have one) for your aftercare needs so that they can reassess your need for medications and monitor your lab values.    Today   CHIEF COMPLAINT:   Chief Complaint  Patient presents with  . Shortness of Breath    VITAL SIGNS:  Blood pressure 132/75, pulse 78, temperature 97.7 F (36.5 C), temperature source Oral, resp. rate 20, height 5\' 7"  (1.702 m), weight 129.275 kg (285 lb), SpO2 90 %.  I/O:   Intake/Output Summary (Last 24 hours) at 07/13/15 1343 Last data filed at 07/13/15 0900  Gross per 24 hour  Intake    180 ml  Output      0 ml  Net    180 ml    PHYSICAL EXAMINATION:   Physical Exam  GENERAL: 55 y.o.-year-old Obese patient lying in the bed with no acute distress.  EYES: Pupils equal, round, reactive to light and accommodation. No scleral icterus.  Extraocular muscles intact.  HEENT: Head atraumatic, normocephalic. Oropharynx and nasopharynx clear.  NECK: Supple, no jugular venous distention. No thyroid enlargement, no tenderness.  LUNGS: much improved scattered expiratory wheezing all over the lung fields, no rales,rhonchi or crepitation. No use of accessory muscles of respiration.  CARDIOVASCULAR: S1, S2 normal. No murmurs, rubs, or gallops.  ABDOMEN: Soft, nontender, nondistended. Bowel sounds present. No organomegaly or mass.  EXTREMITIES: No pedal edema, cyanosis, or clubbing.  NEUROLOGIC: Cranial nerves II through XII are intact. Muscle strength 5/5 in all extremities. Sensation intact. Gait not checked.  PSYCHIATRIC: The patient is alert and oriented x 3.  SKIN: No obvious rash, lesion, or ulcer.   DATA REVIEW:   CBC  Recent Labs Lab 07/11/15 0421  WBC 7.6  HGB 12.9  HCT 38.4  PLT 207    Chemistries   Recent Labs Lab 07/10/15 1651 07/11/15 0421  NA 138 135  K 3.6 4.0  CL 105 104  CO2 24 22  GLUCOSE 221* 406*  BUN 12 15  CREATININE 0.83 0.94  CALCIUM 9.5 9.0  AST 37  --   ALT 43  --   ALKPHOS 73  --   BILITOT 0.6  --     Cardiac Enzymes  Recent Labs Lab 07/10/15 1651  TROPONINI <0.03    Microbiology Results  Results for orders placed or performed in visit on 07/14/13  Culture, blood (single)     Status: None   Collection Time: 07/14/13  6:39 AM  Result Value Ref Range Status   Micro Text Report   Final       COMMENT                   NO GROWTH AEROBICALLY/ANAEROBICALLY IN 5 DAYS   ANTIBIOTIC                                                      Culture, blood (single)     Status: None   Collection Time: 07/14/13  6:39 AM  Result Value Ref Range Status   Micro Text Report   Final       COMMENT                   NO GROWTH AEROBICALLY/ANAEROBICALLY IN 5 DAYS   ANTIBIOTIC                                                        RADIOLOGY:  No results found.  EKG:   Orders  placed or performed during the hospital encounter of 01/15/15  . EKG 12-Lead  . EKG 12-Lead  . EKG      Management plans discussed with the patient, family and they are in agreement.  CODE STATUS:     Code Status Orders        Start     Ordered   07/10/15 2237  Full code   Continuous     07/10/15 2236    Code Status History    Date Active Date Inactive Code Status Order ID Comments User Context   09/26/2014 12:30 PM 09/30/2014  2:42 PM Full Code [id number]  Idelle Crouch, MD Inpatient      TOTAL TIME TAKING CARE OF THIS PATIENT: 36 minutes.    Gladstone Lighter M.D on 07/13/2015 at 1:43 PM  Between 7am to 6pm - Pager - [phone]  After 6pm go to [email or web address] - password [email or web address] [facility]  Office  [phone]  CC: Primary care physician; Perrin Maltese, MD

## 2015-07-13 NOTE — Progress Notes (Signed)
     Nancy Patton was admitted to the [facility] on 07/10/2015 for an acute medical condition and is being Discharged on  07/13/2015 . She is still under treatment and will need another 2 days for recovery and so advised to stay away from work until then. So please excuse her from work for the above days. Should be able to return to work without any restrictions from 07/16/15.  Call Gladstone Lighter  MD, [facility] at  [phone] with questions.  Gladstone Lighter M.D on 07/13/2015,at 10:59 AM  [facility] [address] [place] [address]

## 2015-08-08 ENCOUNTER — Emergency Department: Payer: Managed Care, Other (non HMO)

## 2015-08-08 ENCOUNTER — Encounter: Payer: Self-pay | Admitting: Emergency Medicine

## 2015-08-08 ENCOUNTER — Other Ambulatory Visit: Payer: Self-pay

## 2015-08-08 ENCOUNTER — Emergency Department
Admission: EM | Admit: 2015-08-08 | Discharge: 2015-08-08 | Disposition: A | Discharge status: Home / Self Care | Payer: Managed Care, Other (non HMO) | Attending: Emergency Medicine | Admitting: Emergency Medicine

## 2015-08-08 DIAGNOSIS — Z87891 Personal history of nicotine dependence: Secondary | ICD-10-CM | POA: Insufficient documentation

## 2015-08-08 DIAGNOSIS — Z7984 Long term (current) use of oral hypoglycemic drugs: Secondary | ICD-10-CM | POA: Diagnosis not present

## 2015-08-08 DIAGNOSIS — J441 Chronic obstructive pulmonary disease with (acute) exacerbation: Secondary | ICD-10-CM | POA: Insufficient documentation

## 2015-08-08 DIAGNOSIS — Z792 Long term (current) use of antibiotics: Secondary | ICD-10-CM | POA: Diagnosis not present

## 2015-08-08 DIAGNOSIS — I10 Essential (primary) hypertension: Secondary | ICD-10-CM | POA: Diagnosis not present

## 2015-08-08 DIAGNOSIS — Z79899 Other long term (current) drug therapy: Secondary | ICD-10-CM | POA: Insufficient documentation

## 2015-08-08 DIAGNOSIS — Z8679 Personal history of other diseases of the circulatory system: Secondary | ICD-10-CM | POA: Diagnosis not present

## 2015-08-08 DIAGNOSIS — F329 Major depressive disorder, single episode, unspecified: Secondary | ICD-10-CM | POA: Insufficient documentation

## 2015-08-08 DIAGNOSIS — R0602 Shortness of breath: Secondary | ICD-10-CM

## 2015-08-08 DIAGNOSIS — E119 Type 2 diabetes mellitus without complications: Secondary | ICD-10-CM | POA: Diagnosis not present

## 2015-08-08 LAB — CBC WITH DIFFERENTIAL/PLATELET
Basophils Absolute: 0 10*3/uL (ref 0–0.1)
Basophils Relative: 1 %
Eosinophils Absolute: 0.7 10*3/uL (ref 0–0.7)
Eosinophils Relative: 14 %
HCT: 34.6 % — ABNORMAL LOW (ref 35.0–47.0)
Hemoglobin: 11.9 g/dL — ABNORMAL LOW (ref 12.0–16.0)
Lymphocytes Relative: 28 %
Lymphs Abs: 1.5 10*3/uL (ref 1.0–3.6)
MCH: 29.7 pg (ref 26.0–34.0)
MCHC: 34.5 g/dL (ref 32.0–36.0)
MCV: 86.3 fL (ref 80.0–100.0)
Monocytes Absolute: 0.5 10*3/uL (ref 0.2–0.9)
Monocytes Relative: 10 %
Neutro Abs: 2.4 10*3/uL (ref 1.4–6.5)
Neutrophils Relative %: 47 %
Platelets: 175 10*3/uL (ref 150–440)
RBC: 4.01 MIL/uL (ref 3.80–5.20)
RDW: 14.8 % — ABNORMAL HIGH (ref 11.5–14.5)
WBC: 5.2 10*3/uL (ref 3.6–11.0)

## 2015-08-08 LAB — BASIC METABOLIC PANEL
Anion gap: 8 (ref 5–15)
BUN: 9 mg/dL (ref 6–20)
CO2: 23 mmol/L (ref 22–32)
Calcium: 8.8 mg/dL — ABNORMAL LOW (ref 8.9–10.3)
Chloride: 105 mmol/L (ref 101–111)
Creatinine, Ser: 0.86 mg/dL (ref 0.44–1.00)
GFR calc Af Amer: >60 mL/min (ref >60)
GFR calc non Af Amer: >60 mL/min (ref >60)
Glucose, Bld: 299 mg/dL — ABNORMAL HIGH (ref 65–99)
Potassium: 3.7 mmol/L (ref 3.5–5.1)
Sodium: 136 mmol/L (ref 135–145)

## 2015-08-08 LAB — FIBRIN DERIVATIVES D-DIMER (ARMC ONLY): Fibrin derivatives D-dimer (ARMC): 625 — ABNORMAL HIGH (ref 0–499)

## 2015-08-08 LAB — MAGNESIUM: Magnesium: 1.5 mg/dL — ABNORMAL LOW (ref 1.7–2.4)

## 2015-08-08 MED ORDER — ALBUTEROL SULFATE (2.5 MG/3ML) 0.083% IN NEBU
INHALATION_SOLUTION | RESPIRATORY_TRACT | Status: AC
  Administered 2015-08-08: 10 mg/h via RESPIRATORY_TRACT
  Filled 2015-08-08: qty 12

## 2015-08-08 MED ORDER — PREDNISONE 20 MG PO TABS: 40.0000 mg | ORAL_TABLET | Freq: Every day | ORAL | Status: AC

## 2015-08-08 MED ORDER — PREDNISONE 20 MG PO TABS
60.0000 mg | ORAL_TABLET | Freq: Once | ORAL | Status: AC
  Administered 2015-08-08: 60 mg via ORAL
  Filled 2015-08-08: qty 3

## 2015-08-08 MED ORDER — IPRATROPIUM-ALBUTEROL 0.5-2.5 (3) MG/3ML IN SOLN
3.0000 mL | Freq: Once | RESPIRATORY_TRACT | Status: AC
  Administered 2015-08-08: 3 mL via RESPIRATORY_TRACT
  Filled 2015-08-08: qty 3

## 2015-08-08 MED ORDER — IOPAMIDOL (ISOVUE-370) INJECTION 76%
100.0000 mL | Freq: Once | INTRAVENOUS | Status: AC | PRN
  Administered 2015-08-08: 100 mL via INTRAVENOUS

## 2015-08-08 MED ORDER — ALBUTEROL SULFATE (2.5 MG/3ML) 0.083% IN NEBU
10.0000 mg/h | INHALATION_SOLUTION | RESPIRATORY_TRACT | Status: DC
  Administered 2015-08-08: 10 mg/h via RESPIRATORY_TRACT

## 2015-08-08 NOTE — ED Notes (Signed)
AAOx3.  Skin warm and dry.  NO SOB/ DOE.  NAD.  D/C HOme

## 2015-08-08 NOTE — ED Notes (Signed)
Pt reports shortness of breath since yesterday, hx of asthma. Pt reports using inhalers without relief. Pt with increased respiratory effort in triage.

## 2015-08-08 NOTE — ED Provider Notes (Signed)
Time Seen: Approximately 0 900  I have reviewed the triage notes  Chief Complaint: Shortness of Breath   History of Present Illness: Nancy Patton is a 55 y.o. female who has a history of asthma with previous hospitalizations. Patient was hospitalized last month for exacerbation of her asthma. She states is generally happens once per year. She states she works in [employer] but otherwise has no obvious source for her asthma exacerbations. Patient denies any fever, chills, productive cough. She states she had some mild back pain yesterday over the upper part of her back but none today. She denies any leg pain or swelling. She states prior to her shortness of breath that started last evening prior to arrival today she's had one home DuoNeb and and a puff on her inhaler. Patient denies any productive cough. She denies any history of blood clots in legs or lungs. She states she was on prednisone and usually works effectively for her.  Past Medical History  Diagnosis Date  . Asthma   . Hypertension   . Diabetes mellitus without complication ([place])   . Obesity   . Pulmonary hypertension [facility])     Patient Active Problem List   Diagnosis Date Noted  . HTN (hypertension) 07/10/2015  . Type 2 diabetes mellitus ([place]) 07/10/2015  . Depression 07/10/2015  . Acute respiratory distress ([facility]) 09/26/2014  . Asthmatic bronchitis with acute exacerbation 09/26/2014  . Asthmatic bronchitis with exacerbation 09/26/2014    Past Surgical History  Procedure Laterality Date  . Cesarean section    . Cataract extraction      Past Surgical History  Procedure Laterality Date  . Cesarean section    . Cataract extraction      Current Outpatient Rx  Name  Route  Sig  Dispense  Refill  . albuterol (PROVENTIL HFA;VENTOLIN HFA) 108 (90 BASE) MCG/ACT inhaler      Inhale 4-6 puffs by mouth every 4 hours as needed for wheezing, cough, and/or shortness of breath   1 Inhaler   1   . azithromycin  (ZITHROMAX) 250 MG tablet   Oral   Take 1 tablet (250 mg total) by mouth daily. X 3 more days   3 tablet   0   . BREO ELLIPTA 200-25 MCG/INH AEPB   Inhalation   Inhale 1 puff into the lungs daily.           Dispense as written.   . citalopram (CELEXA) 20 MG tablet   Oral   Take 20 mg by mouth daily.      10   . flurbiprofen (OCUFEN) 0.03 % ophthalmic solution   Right Eye   Place 1 drop into the right eye 3 (three) times daily. Do not use while wearing contact lenses         . glimepiride (AMARYL) 2 MG tablet   Oral   Take 2 mg by mouth 2 (two) times daily.      3   . levalbuterol (XOPENEX) 1.25 MG/3ML nebulizer solution   Nebulization   Take 3 mLs by nebulization 3 (three) times daily.      6   . levofloxacin (QUIXIN) 0.5 % ophthalmic solution   Right Eye   Place 1 drop into the right eye 4 (four) times daily.         Marland Kitchen losartan-hydrochlorothiazide (HYZAAR) 50-12.5 MG per tablet   Oral   Take 1 tablet by mouth daily.      5   .  metFORMIN (GLUCOPHAGE) 1000 MG tablet   Oral   Take 1,000 mg by mouth 2 (two) times daily.      2   . montelukast (SINGULAIR) 10 MG tablet   Oral   Take 10 mg by mouth at bedtime.          . predniSONE (STERAPRED UNI-PAK 21 TAB) 10 MG (21) TBPK tablet   Oral   Take 1 tablet (10 mg total) by mouth daily. 6 tabs PO x 2 days 5 tabs PO x 2 days 4 tabs PO x 2 days 3 tabs PO x 2 days 2 tabs PO x 2 days 1 tab PO x 2 days and stop   42 tablet   0   . tiotropium (SPIRIVA) 18 MCG inhalation capsule   Inhalation   Place 18 mcg into inhaler and inhale daily.           Allergies:  Review of patient's allergies indicates no known allergies.  Family History: Family History  Problem Relation Age of Onset  . Cancer Neg Hx   . Diabetes Neg Hx   . Heart failure Neg Hx   . Hypertension Neg Hx     Social History: Social History  Substance Use Topics  . Smoking status: Former Research scientist (life sciences)  . Smokeless tobacco: None  .  Alcohol Use: No     Comment: 2nd hand smoke     Review of Systems:   10 point review of systems was performed and was otherwise negative:  Constitutional: No fever Eyes: No visual disturbances ENT: No sore throat, ear pain Cardiac: No chest pain Respiratory:Shortness of breath. No obvious pleuritic pain or positional component. Abdomen: No abdominal pain, no vomiting, No diarrhea Endocrine: No weight loss, No night sweats Extremities: No peripheral edema, cyanosis Skin: No rashes, easy bruising Neurologic: No focal weakness, trouble with speech or swollowing Urologic: No dysuria, Hematuria, or urinary frequency   Physical Exam:  ED Triage Vitals  Enc Vitals Group     BP 08/08/15 0854 136/76 mmHg     Pulse Rate 08/08/15 0854 94     Resp 08/08/15 0854 24     Temp 08/08/15 0854 98.5 F (36.9 C)     Temp Source 08/08/15 0854 Oral     SpO2 08/08/15 0854 94 %     Weight 08/08/15 0854 285 lb (129.275 kg)     Height 08/08/15 0854 5\' 7"  (1.702 m)     Head Cir --      Peak Flow --      Pain Score --      Pain Loc --      Pain Edu? --      Excl. in [place]? --     General: Awake , Alert , and Oriented times 3; GCS 15Speaks and slightly interrupted sentences with no audible wheezing at the bedside. No upper respiratory retractions. Head: Normal cephalic , atraumatic Eyes: Pupils equal , round, reactive to light Nose/Throat: No nasal drainage, patent upper airway without erythema or exudate.  Neck: Supple, Full range of motion, No anterior adenopathy or palpable thyroid masses Lungs: And expiratory wheezing heard throughout and symmetric without rales or rhonchi Heart: Regular rate, regular rhythm without murmurs , gallops , or rubs Abdomen: Soft, non tender without rebound, guarding , or rigidity; bowel sounds positive and symmetric in all 4 quadrants. No organomegaly .        Extremities: 2 plus symmetric pulses. No edema, clubbing or cyanosis Neurologic: normal ambulation, Motor  symmetric without deficits, sensory intact Skin: warm, dry, no rashes   Labs:   All laboratory work was reviewed including any pertinent negatives or positives listed below:  Labs Reviewed  BASIC METABOLIC PANEL - Abnormal; Notable for the following:    Glucose, Bld 299 (*)    Calcium 8.8 (*)    All other components within normal limits  CBC WITH DIFFERENTIAL/PLATELET - Abnormal; Notable for the following:    Hemoglobin 11.9 (*)    HCT 34.6 (*)    RDW 14.8 (*)    All other components within normal limits  MAGNESIUM - Abnormal; Notable for the following:    Magnesium 1.5 (*)    All other components within normal limits  FIBRIN DERIVATIVES D-DIMER [facility] ONLY)    EKG: ED ECG REPORT I, Daymon Larsen, the attending physician, personally viewed and interpreted this ECG.  Date: 08/08/2015 EKG Time: 0839 Rate: 77 Rhythm: normal sinus rhythm QRS Axis: normal Intervals: normal ST/T Wave abnormalities: normal Conduction Disturbances: none Narrative Interpretation: unremarkable *Low voltage QRS No acute ischemic changes   Radiology: *         CT ANGIO CHEST PE W OR WO CONTRAST (Final result) Result time: 08/08/15 13:16:24   Final result by Rad Results In Interface (08/08/15 13:16:24)   Narrative:   CLINICAL DATA: Patient with shortness of breath.  EXAM: CT ANGIOGRAPHY CHEST WITH CONTRAST  TECHNIQUE: Multidetector CT imaging of the chest was performed using the standard protocol during bolus administration of intravenous contrast. Multiplanar CT image reconstructions and MIPs were obtained to evaluate the vascular anatomy.  CONTRAST: 100 cc Isovue 370  COMPARISON: Chest radiograph earlier same date.  FINDINGS: Mediastinum/Nodes: Heart is enlarged. No pericardial effusion. Main pulmonary artery is dilated measuring 3.8 cm. Multiple prominent sub cm mediastinal lymph nodes are demonstrated. 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  Motion artifact and bolus technique limits evaluation. No evidence for central, main, lobar and proximal segmental pulmonary embolus. Evaluation the distal segmental and subsegmental pulmonary arteries is nondiagnostic.  Lungs/Pleura: Expiratory phase imaging. Central airways are patent. Subpleural ground-glass and consolidative opacities are demonstrated within the left lower lobe and lingula. No pleural effusion or pneumothorax.  Upper abdomen: The liver is diffusely low in attenuation compatible with steatosis. Small hiatal hernia.  Musculoskeletal: Thoracic spine degenerative changes. No aggressive or acute appearing osseous lesions.  Review of the MIP images confirms the above findings.  IMPRESSION: No evidence for pulmonary embolism. Evaluation of the distal segmental and subsegmental pulmonary arteries nondiagnostic due to motion artifact and bolus technique.  Ascending thoracic aorta measures 4.1 cm. Recommend annual imaging followup by CTA or MRA. This recommendation follows 2010 ACCF/AHA/AATS/ACR/ASA/SCA/SCAI/SIR/STS/SVM Guidelines for the Diagnosis and Management of Patients with Thoracic Aortic Disease. Circulation. 2010; [id number]  Main pulmonary artery is enlarged as can be seen with pulmonary arterial hypertension.  Hepatic steatosis.   Electronically Signed By: Lovey Newcomer M.D. On: 08/08/2015 13:16          DG Chest Port 1 View (Final result) Result time: 08/08/15 09:32:33   Final result by Rad Results In Interface (08/08/15 09:32:33)   Narrative:   CLINICAL DATA: Patient with shortness of breath. History of asthma.  EXAM: PORTABLE CHEST 1 VIEW  COMPARISON: Chest radiograph 07/10/2015.  FINDINGS: Monitoring leads overlie the patient. Stable cardiac and mediastinal contours. No consolidative pulmonary opacities. No pleural effusion or pneumothorax.  IMPRESSION: No acute cardiopulmonary process.   Electronically  Signed By: Lovey Newcomer M.D. On: 08/08/2015 09:32  I personally reviewed the radiologic studies     ED Course:  Patient's stay here was uneventful. Her D-dimer test was positive and I only have a moderate suspicion for pulmonary embolism. She's had a recent hospital stay for her asthma. The patient improved with a single DuoNeb along with a 1 hour continuous albuterol treatment. She also received oral steroids here in emergency department. Her CT was negative for pulmonary embolism and with a low to moderate clinical suspicion I felt we had adequately ruled this out. The patient does have some indications of pulmonary hypertension likely associated with her asthma and obesity. The patient was advised of the CAT scan results including the small thoracic aneurysm. I felt this was not of clinical significance that I recommended further follow-up on an outpatient basis. Patient will be prescribed prednisone and I felt she did not require any antibiotic therapy. Repeat exam of her lungs 2 show gradual improvement of her wheezing and now is asymptomatic from this point.*    Assessment:  Acute exacerbation of chronic asthma*    Plan: * Outpatient management Prednisone prescription Patient was advised to return immediately if condition worsens. Patient was advised to follow up with their primary care physician or other specialized physicians involved in their outpatient care. The patient and/or family member/power of attorney had laboratory results reviewed at the bedside. All questions and concerns were addressed and appropriate discharge instructions were distributed by the nursing staff.             Daymon Larsen, MD 08/08/15 1501

## 2015-08-24 ENCOUNTER — Encounter: Payer: Self-pay | Admitting: Emergency Medicine

## 2015-08-24 ENCOUNTER — Inpatient Hospital Stay: Payer: Managed Care, Other (non HMO)

## 2015-08-24 ENCOUNTER — Inpatient Hospital Stay
Admission: EM | Admit: 2015-08-24 | Discharge: 2015-08-29 | DRG: 190 | Disposition: A | Discharge status: Home / Self Care | Payer: Managed Care, Other (non HMO) | Attending: Internal Medicine | Admitting: Internal Medicine

## 2015-08-24 ENCOUNTER — Emergency Department: Payer: Managed Care, Other (non HMO)

## 2015-08-24 DIAGNOSIS — T380X5A Adverse effect of glucocorticoids and synthetic analogues, initial encounter: Secondary | ICD-10-CM | POA: Diagnosis present

## 2015-08-24 DIAGNOSIS — F329 Major depressive disorder, single episode, unspecified: Secondary | ICD-10-CM | POA: Diagnosis present

## 2015-08-24 DIAGNOSIS — J441 Chronic obstructive pulmonary disease with (acute) exacerbation: Secondary | ICD-10-CM | POA: Diagnosis present

## 2015-08-24 DIAGNOSIS — I10 Essential (primary) hypertension: Secondary | ICD-10-CM | POA: Diagnosis present

## 2015-08-24 DIAGNOSIS — J9621 Acute and chronic respiratory failure with hypoxia: Secondary | ICD-10-CM | POA: Diagnosis not present

## 2015-08-24 DIAGNOSIS — J432 Centrilobular emphysema: Secondary | ICD-10-CM

## 2015-08-24 DIAGNOSIS — Z6841 Body Mass Index (BMI) 40.0 and over, adult: Secondary | ICD-10-CM | POA: Diagnosis not present

## 2015-08-24 DIAGNOSIS — R0902 Hypoxemia: Secondary | ICD-10-CM

## 2015-08-24 DIAGNOSIS — Z87891 Personal history of nicotine dependence: Secondary | ICD-10-CM | POA: Diagnosis not present

## 2015-08-24 DIAGNOSIS — E1165 Type 2 diabetes mellitus with hyperglycemia: Secondary | ICD-10-CM | POA: Diagnosis present

## 2015-08-24 DIAGNOSIS — J9601 Acute respiratory failure with hypoxia: Secondary | ICD-10-CM

## 2015-08-24 DIAGNOSIS — Z9114 Patient's other noncompliance with medication regimen: Secondary | ICD-10-CM

## 2015-08-24 DIAGNOSIS — J45901 Unspecified asthma with (acute) exacerbation: Secondary | ICD-10-CM

## 2015-08-24 DIAGNOSIS — J440 Chronic obstructive pulmonary disease with acute lower respiratory infection: Principal | ICD-10-CM | POA: Diagnosis present

## 2015-08-24 DIAGNOSIS — J189 Pneumonia, unspecified organism: Secondary | ICD-10-CM | POA: Diagnosis present

## 2015-08-24 DIAGNOSIS — R062 Wheezing: Secondary | ICD-10-CM | POA: Diagnosis not present

## 2015-08-24 DIAGNOSIS — E669 Obesity, unspecified: Secondary | ICD-10-CM | POA: Diagnosis not present

## 2015-08-24 DIAGNOSIS — G4733 Obstructive sleep apnea (adult) (pediatric): Secondary | ICD-10-CM | POA: Diagnosis not present

## 2015-08-24 DIAGNOSIS — R0600 Dyspnea, unspecified: Secondary | ICD-10-CM

## 2015-08-24 DIAGNOSIS — Z79899 Other long term (current) drug therapy: Secondary | ICD-10-CM

## 2015-08-24 DIAGNOSIS — I272 Other secondary pulmonary hypertension: Secondary | ICD-10-CM | POA: Diagnosis present

## 2015-08-24 DIAGNOSIS — Z9119 Patient's noncompliance with other medical treatment and regimen: Secondary | ICD-10-CM | POA: Diagnosis not present

## 2015-08-24 LAB — CBC
HCT: 41.3 % (ref 35.0–47.0)
Hemoglobin: 14 g/dL (ref 12.0–16.0)
MCH: 29.1 pg (ref 26.0–34.0)
MCHC: 33.9 g/dL (ref 32.0–36.0)
MCV: 85.9 fL (ref 80.0–100.0)
Platelets: 198 10*3/uL (ref 150–440)
RBC: 4.81 MIL/uL (ref 3.80–5.20)
RDW: 14 % (ref 11.5–14.5)
WBC: 9.7 10*3/uL (ref 3.6–11.0)

## 2015-08-24 LAB — BASIC METABOLIC PANEL
Anion gap: 8 (ref 5–15)
BUN: 13 mg/dL (ref 6–20)
CO2: 26 mmol/L (ref 22–32)
Calcium: 9.6 mg/dL (ref 8.9–10.3)
Chloride: 104 mmol/L (ref 101–111)
Creatinine, Ser: 0.95 mg/dL (ref 0.44–1.00)
GFR calc Af Amer: >60 mL/min (ref >60)
GFR calc non Af Amer: >60 mL/min (ref >60)
Glucose, Bld: 238 mg/dL — ABNORMAL HIGH (ref 65–99)
Potassium: 3.7 mmol/L (ref 3.5–5.1)
Sodium: 138 mmol/L (ref 135–145)

## 2015-08-24 LAB — BLOOD GAS, VENOUS
Acid-base deficit: 2.8 mmol/L — ABNORMAL HIGH (ref 0.0–2.0)
Bicarbonate: 23.2 mEq/L (ref 21.0–28.0)
O2 Saturation: 87 %
Patient temperature: 37
pCO2, Ven: 44 mmHg (ref 44.0–60.0)
pH, Ven: 7.33 (ref 7.320–7.430)
pO2, Ven: 57 mmHg — ABNORMAL HIGH (ref 31.0–45.0)

## 2015-08-24 LAB — BLOOD GAS, ARTERIAL
Acid-base deficit: 3.1 mmol/L — ABNORMAL HIGH (ref 0.0–2.0)
Allens test (pass/fail): POSITIVE — AB
Bicarbonate: 22 mEq/L (ref 21.0–28.0)
FIO2: 0.32
O2 Saturation: 84.8 %
Patient temperature: 37
pCO2 arterial: 39 mmHg (ref 32.0–48.0)
pH, Arterial: 7.36 (ref 7.350–7.450)
pO2, Arterial: 52 mmHg — ABNORMAL LOW (ref 83.0–108.0)

## 2015-08-24 LAB — TROPONIN I
Troponin I: <0.03 ng/mL (ref <0.03)
Troponin I: <0.03 ng/mL (ref <0.03)

## 2015-08-24 LAB — GLUCOSE, CAPILLARY
Glucose-Capillary: 403 mg/dL — ABNORMAL HIGH (ref 65–99)
Glucose-Capillary: 423 mg/dL — ABNORMAL HIGH (ref 65–99)
Glucose-Capillary: 386 mg/dL — ABNORMAL HIGH (ref 65–99)
Glucose-Capillary: 277 mg/dL — ABNORMAL HIGH (ref 65–99)
Glucose-Capillary: 246 mg/dL — ABNORMAL HIGH (ref 65–99)
Glucose-Capillary: 321 mg/dL — ABNORMAL HIGH (ref 65–99)

## 2015-08-24 LAB — PROCALCITONIN: Procalcitonin: <0.1 ng/mL

## 2015-08-24 LAB — BRAIN NATRIURETIC PEPTIDE: B Natriuretic Peptide: 44 pg/mL (ref 0.0–100.0)

## 2015-08-24 LAB — MRSA PCR SCREENING: MRSA by PCR: NEGATIVE

## 2015-08-24 LAB — TSH: TSH: 4.336 u[IU]/mL (ref 0.350–4.500)

## 2015-08-24 LAB — HEMOGLOBIN A1C: Hgb A1c MFr Bld: 10.1 % — ABNORMAL HIGH (ref 4.0–6.0)

## 2015-08-24 MED ORDER — INSULIN GLARGINE 100 UNIT/ML ~~LOC~~ SOLN
15.0000 [IU] | Freq: Every day | SUBCUTANEOUS | Status: DC
  Administered 2015-08-24 – 2015-08-25 (×2): 15 [IU] via SUBCUTANEOUS
  Filled 2015-08-24 (×3): qty 0.15

## 2015-08-24 MED ORDER — ACETAMINOPHEN 325 MG PO TABS
650.0000 mg | ORAL_TABLET | Freq: Four times a day (QID) | ORAL | Status: DC | PRN
Start: 2015-08-24 — End: 2015-08-29

## 2015-08-24 MED ORDER — IPRATROPIUM-ALBUTEROL 0.5-2.5 (3) MG/3ML IN SOLN
3.0000 mL | Freq: Once | RESPIRATORY_TRACT | Status: AC
  Administered 2015-08-24: 3 mL via RESPIRATORY_TRACT

## 2015-08-24 MED ORDER — BUDESONIDE 0.25 MG/2ML IN SUSP: 0.2500 mg | Freq: Two times a day (BID) | RESPIRATORY_TRACT | Status: DC

## 2015-08-24 MED ORDER — ACETAMINOPHEN 650 MG RE SUPP: 650.0000 mg | Freq: Four times a day (QID) | RECTAL | Status: DC | PRN

## 2015-08-24 MED ORDER — IPRATROPIUM-ALBUTEROL 0.5-2.5 (3) MG/3ML IN SOLN
3.0000 mL | Freq: Once | RESPIRATORY_TRACT | Status: AC
  Administered 2015-08-24: 3 mL via RESPIRATORY_TRACT
  Filled 2015-08-24: qty 3

## 2015-08-24 MED ORDER — AZITHROMYCIN 250 MG PO TABS
500.0000 mg | ORAL_TABLET | Freq: Every day | ORAL | Status: AC
  Administered 2015-08-24: 500 mg via ORAL
  Filled 2015-08-24: qty 2

## 2015-08-24 MED ORDER — ALBUTEROL SULFATE (2.5 MG/3ML) 0.083% IN NEBU
2.5000 mg | INHALATION_SOLUTION | Freq: Once | RESPIRATORY_TRACT | Status: AC
  Administered 2015-08-24: 2.5 mg via RESPIRATORY_TRACT
  Filled 2015-08-24: qty 3

## 2015-08-24 MED ORDER — MAGNESIUM SULFATE 2 GM/50ML IV SOLN
2.0000 g | Freq: Once | INTRAVENOUS | Status: AC
  Administered 2015-08-24: 2 g via INTRAVENOUS
  Filled 2015-08-24: qty 50

## 2015-08-24 MED ORDER — PREDNISONE 20 MG PO TABS: 40.0000 mg | ORAL_TABLET | Freq: Every day | ORAL | Status: DC

## 2015-08-24 MED ORDER — MORPHINE SULFATE (PF) 2 MG/ML IV SOLN
2.0000 mg | INTRAVENOUS | Status: DC | PRN
  Filled 2015-08-24: qty 1

## 2015-08-24 MED ORDER — ALBUTEROL SULFATE (2.5 MG/3ML) 0.083% IN NEBU
INHALATION_SOLUTION | RESPIRATORY_TRACT | Status: AC
  Filled 2015-08-24: qty 3

## 2015-08-24 MED ORDER — METHYLPREDNISOLONE SODIUM SUCC 125 MG IJ SOLR
125.0000 mg | Freq: Once | INTRAMUSCULAR | Status: AC
  Administered 2015-08-24: 125 mg via INTRAVENOUS
  Filled 2015-08-24: qty 2

## 2015-08-24 MED ORDER — INSULIN ASPART 100 UNIT/ML ~~LOC~~ SOLN
0.0000 [IU] | Freq: Every day | SUBCUTANEOUS | Status: DC
  Administered 2015-08-24: 4 [IU] via SUBCUTANEOUS
  Administered 2015-08-25: 2 [IU] via SUBCUTANEOUS
  Filled 2015-08-24: qty 8
  Filled 2015-08-24: qty 4
  Filled 2015-08-24: qty 2

## 2015-08-24 MED ORDER — ENOXAPARIN SODIUM 40 MG/0.4ML ~~LOC~~ SOLN
40.0000 mg | SUBCUTANEOUS | Status: DC
Start: 2015-08-24 — End: 2015-08-25
  Administered 2015-08-24: 40 mg via SUBCUTANEOUS
  Filled 2015-08-24: qty 0.4

## 2015-08-24 MED ORDER — HYDRALAZINE HCL 20 MG/ML IJ SOLN: 10.0000 mg | INTRAMUSCULAR | Status: DC | PRN

## 2015-08-24 MED ORDER — ALBUTEROL SULFATE (2.5 MG/3ML) 0.083% IN NEBU
5.0000 mg | INHALATION_SOLUTION | Freq: Once | RESPIRATORY_TRACT | Status: AC
  Administered 2015-08-24: 5 mg via RESPIRATORY_TRACT

## 2015-08-24 MED ORDER — DOCUSATE SODIUM 100 MG PO CAPS
100.0000 mg | ORAL_CAPSULE | Freq: Two times a day (BID) | ORAL | Status: DC
  Administered 2015-08-24 – 2015-08-28 (×8): 100 mg via ORAL
  Filled 2015-08-24 (×10): qty 1

## 2015-08-24 MED ORDER — PREDNISONE 20 MG PO TABS: 20.0000 mg | ORAL_TABLET | Freq: Every day | ORAL | Status: DC

## 2015-08-24 MED ORDER — PREDNISONE 5 MG PO TABS: 5.0000 mg | ORAL_TABLET | Freq: Every day | ORAL | Status: DC

## 2015-08-24 MED ORDER — LOSARTAN POTASSIUM-HCTZ 50-12.5 MG PO TABS: 1.0000 | ORAL_TABLET | Freq: Every day | ORAL | Status: DC

## 2015-08-24 MED ORDER — CITALOPRAM HYDROBROMIDE 20 MG PO TABS
20.0000 mg | ORAL_TABLET | Freq: Every day | ORAL | Status: DC
  Administered 2015-08-24 – 2015-08-28 (×5): 20 mg via ORAL
  Filled 2015-08-24 (×5): qty 1

## 2015-08-24 MED ORDER — METHYLPREDNISOLONE SODIUM SUCC 125 MG IJ SOLR
60.0000 mg | Freq: Two times a day (BID) | INTRAMUSCULAR | Status: DC
  Administered 2015-08-24: 15:00:00 60 mg via INTRAVENOUS
  Filled 2015-08-24: qty 2

## 2015-08-24 MED ORDER — FUROSEMIDE 10 MG/ML IJ SOLN
40.0000 mg | Freq: Once | INTRAMUSCULAR | Status: AC
  Administered 2015-08-24: 40 mg via INTRAVENOUS
  Filled 2015-08-24: qty 4

## 2015-08-24 MED ORDER — METHYLPREDNISOLONE SODIUM SUCC 125 MG IJ SOLR
60.0000 mg | Freq: Three times a day (TID) | INTRAMUSCULAR | Status: DC
  Administered 2015-08-24 – 2015-08-25 (×2): 60 mg via INTRAVENOUS
  Filled 2015-08-24 (×2): qty 2

## 2015-08-24 MED ORDER — INSULIN ASPART 100 UNIT/ML ~~LOC~~ SOLN
0.0000 [IU] | Freq: Three times a day (TID) | SUBCUTANEOUS | Status: DC
  Administered 2015-08-24: 5 [IU] via SUBCUTANEOUS
  Administered 2015-08-24: 13:00:00 15 [IU] via SUBCUTANEOUS
  Administered 2015-08-25: 8 [IU] via SUBCUTANEOUS
  Administered 2015-08-25: 15 [IU] via SUBCUTANEOUS
  Administered 2015-08-25: 11 [IU] via SUBCUTANEOUS
  Administered 2015-08-26: 8 [IU] via SUBCUTANEOUS
  Administered 2015-08-26: 11 [IU] via SUBCUTANEOUS
  Administered 2015-08-26: 15 [IU] via SUBCUTANEOUS
  Administered 2015-08-27: 5 [IU] via SUBCUTANEOUS
  Administered 2015-08-27: 2 [IU] via SUBCUTANEOUS
  Administered 2015-08-27: 8 [IU] via SUBCUTANEOUS
  Administered 2015-08-28: 2 [IU] via SUBCUTANEOUS
  Administered 2015-08-28: 5 [IU] via SUBCUTANEOUS
  Administered 2015-08-28: 17:00:00 8 [IU] via SUBCUTANEOUS
  Administered 2015-08-29: 3 [IU] via SUBCUTANEOUS
  Filled 2015-08-24: qty 2
  Filled 2015-08-24: qty 11
  Filled 2015-08-24: qty 15
  Filled 2015-08-24: qty 11
  Filled 2015-08-24: qty 5
  Filled 2015-08-24: qty 15
  Filled 2015-08-24: qty 11
  Filled 2015-08-24: qty 8
  Filled 2015-08-24: qty 5
  Filled 2015-08-24 (×2): qty 8
  Filled 2015-08-24: qty 2
  Filled 2015-08-24: qty 5
  Filled 2015-08-24: qty 15

## 2015-08-24 MED ORDER — ONDANSETRON HCL 4 MG/2ML IJ SOLN: 4.0000 mg | Freq: Four times a day (QID) | INTRAMUSCULAR | Status: DC | PRN

## 2015-08-24 MED ORDER — PREDNISONE 20 MG PO TABS: 50.0000 mg | ORAL_TABLET | Freq: Every day | ORAL | Status: DC

## 2015-08-24 MED ORDER — HYDROCHLOROTHIAZIDE 12.5 MG PO CAPS
12.5000 mg | ORAL_CAPSULE | Freq: Every day | ORAL | Status: DC
  Administered 2015-08-24 – 2015-08-28 (×5): 12.5 mg via ORAL
  Filled 2015-08-24 (×5): qty 1

## 2015-08-24 MED ORDER — LOSARTAN POTASSIUM 50 MG PO TABS
50.0000 mg | ORAL_TABLET | Freq: Every day | ORAL | Status: DC
  Administered 2015-08-24 – 2015-08-28 (×5): 50 mg via ORAL
  Filled 2015-08-24 (×5): qty 1

## 2015-08-24 MED ORDER — PREDNISONE 10 MG PO TABS
10.0000 mg | ORAL_TABLET | Freq: Every day | ORAL | Status: DC
Start: 2015-08-29 — End: 2015-08-24

## 2015-08-24 MED ORDER — ONDANSETRON HCL 4 MG PO TABS: 4.0000 mg | ORAL_TABLET | Freq: Four times a day (QID) | ORAL | Status: DC | PRN

## 2015-08-24 MED ORDER — FLUTICASONE FUROATE-VILANTEROL 200-25 MCG/INH IN AEPB: 1.0000 | INHALATION_SPRAY | Freq: Every day | RESPIRATORY_TRACT | Status: DC

## 2015-08-24 MED ORDER — PREDNISONE 20 MG PO TABS: 30.0000 mg | ORAL_TABLET | Freq: Every day | ORAL | Status: DC

## 2015-08-24 MED ORDER — SODIUM CHLORIDE 0.9 % IV BOLUS (SEPSIS)
1000.0000 mL | Freq: Once | INTRAVENOUS | Status: AC
  Administered 2015-08-24: 1000 mL via INTRAVENOUS

## 2015-08-24 MED ORDER — ALBUTEROL SULFATE (2.5 MG/3ML) 0.083% IN NEBU
2.5000 mg | INHALATION_SOLUTION | RESPIRATORY_TRACT | Status: DC
  Administered 2015-08-24: 08:00:00 2.5 mg via RESPIRATORY_TRACT
  Filled 2015-08-24 (×2): qty 3

## 2015-08-24 MED ORDER — IPRATROPIUM-ALBUTEROL 0.5-2.5 (3) MG/3ML IN SOLN
3.0000 mL | RESPIRATORY_TRACT | Status: DC | PRN
  Administered 2015-08-24 (×2): 3 mL via RESPIRATORY_TRACT
  Filled 2015-08-24 (×2): qty 3

## 2015-08-24 MED ORDER — CETYLPYRIDINIUM CHLORIDE 0.05 % MT LIQD
7.0000 mL | Freq: Two times a day (BID) | OROMUCOSAL | Status: DC
  Administered 2015-08-24: 7 mL via OROMUCOSAL

## 2015-08-24 MED ORDER — AZITHROMYCIN 250 MG PO TABS: 250.0000 mg | ORAL_TABLET | Freq: Every day | ORAL | Status: DC

## 2015-08-24 MED ORDER — TIOTROPIUM BROMIDE MONOHYDRATE 18 MCG IN CAPS
18.0000 ug | ORAL_CAPSULE | Freq: Every day | RESPIRATORY_TRACT | Status: DC
Start: 2015-08-24 — End: 2015-08-24
  Administered 2015-08-24: 09:00:00 18 ug via RESPIRATORY_TRACT
  Filled 2015-08-24: qty 5

## 2015-08-24 MED ORDER — CHLORHEXIDINE GLUCONATE 0.12 % MT SOLN
15.0000 mL | Freq: Two times a day (BID) | OROMUCOSAL | Status: DC
  Administered 2015-08-24: 15 mL via OROMUCOSAL
  Filled 2015-08-24: qty 15

## 2015-08-24 MED ORDER — SODIUM CHLORIDE 0.9% FLUSH
3.0000 mL | Freq: Two times a day (BID) | INTRAVENOUS | Status: DC
  Administered 2015-08-24 – 2015-08-28 (×9): 3 mL via INTRAVENOUS

## 2015-08-24 MED ORDER — BUDESONIDE 0.25 MG/2ML IN SUSP
0.2500 mg | Freq: Two times a day (BID) | RESPIRATORY_TRACT | Status: DC
  Administered 2015-08-24 – 2015-08-26 (×4): 0.25 mg via RESPIRATORY_TRACT
  Filled 2015-08-24 (×4): qty 2

## 2015-08-24 MED ORDER — LEVOFLOXACIN 500 MG PO TABS: 500.0000 mg | ORAL_TABLET | Freq: Every day | ORAL | Status: DC

## 2015-08-24 MED ORDER — MOMETASONE FURO-FORMOTEROL FUM 200-5 MCG/ACT IN AERO
2.0000 | INHALATION_SPRAY | Freq: Two times a day (BID) | RESPIRATORY_TRACT | Status: DC
  Administered 2015-08-24: 09:00:00 2 via RESPIRATORY_TRACT
  Filled 2015-08-24: qty 8.8

## 2015-08-24 MED ORDER — INSULIN ASPART 100 UNIT/ML ~~LOC~~ SOLN
20.0000 [IU] | Freq: Once | SUBCUTANEOUS | Status: AC
  Administered 2015-08-24: 20 [IU] via SUBCUTANEOUS
  Filled 2015-08-24: qty 20

## 2015-08-24 MED ORDER — LEVOFLOXACIN 750 MG PO TABS
750.0000 mg | ORAL_TABLET | Freq: Every day | ORAL | Status: AC
  Administered 2015-08-25 – 2015-08-28 (×4): 750 mg via ORAL
  Filled 2015-08-24: qty 2
  Filled 2015-08-24: qty 1
  Filled 2015-08-24 (×2): qty 2

## 2015-08-24 MED ORDER — IPRATROPIUM-ALBUTEROL 0.5-2.5 (3) MG/3ML IN SOLN
3.0000 mL | RESPIRATORY_TRACT | Status: DC
  Administered 2015-08-24 – 2015-08-25 (×6): 3 mL via RESPIRATORY_TRACT
  Filled 2015-08-24 (×5): qty 3

## 2015-08-24 NOTE — Consult Note (Signed)
PULMONARY / CRITICAL CARE MEDICINE   Name: Nancy Patton MRN: [id number] DOB: [birth year]    ADMISSION DATE:  08/24/2015   CONSULTATION DATE:  08/24/2015  REFERRING MD:  Dr. Hillary Bow  CHIEF COMPLAINT:  Worsening dyspnea  HISTORY OF PRESENT ILLNESS: Nancy Patton is a 55 yo female with a PMH of asthma, hypertension, obesity, T2DM and pulmonary hypertension who presents with worsening dyspnea. She states she is usually dyspneic with exertion at baseline but symptoms got worse over the last three days and she became severely dyspneic at rest hence she decided to come to the ED. Dyspnea was associated with a mildly productive cough.  Denies fever but reports profound wheezing.  At the ED, she was hypoxic with SPO2 in the 80s on room air and tachypneic with respiratory rate in the 40s. SPO2 improved with supplemental O2. She was admitted to the floor. Despite nebulized bronchodilators, steroids, IV steroids and supplemental O2, she remained tachypneic and severely dyspneic hence PCCM was consulted for further management.  She was recently in the ED with similar symptoms. She was treated and discharged.  She also reports medication non-adherence due to financial reasons.    PAST MEDICAL HISTORY :  She  has a past medical history of Asthma; Hypertension; Diabetes mellitus without complication ([place]); Obesity; and Pulmonary hypertension ([place]).  PAST SURGICAL HISTORY: She  has past surgical history that includes Cesarean section and Cataract extraction.  No Known Allergies  No current facility-administered medications on file prior to encounter.   Current Outpatient Prescriptions on File Prior to Encounter  Medication Sig  . albuterol (PROVENTIL HFA;VENTOLIN HFA) 108 (90 BASE) MCG/ACT inhaler Inhale 4-6 puffs by mouth every 4 hours as needed for wheezing, cough, and/or shortness of breath  . BREO ELLIPTA 200-25 MCG/INH AEPB Inhale 1 puff into the lungs daily.  . citalopram (CELEXA) 20 MG  tablet Take 20 mg by mouth daily.  Marland Kitchen levalbuterol (XOPENEX) 1.25 MG/3ML nebulizer solution Take 3 mLs by nebulization every 8 (eight) hours as needed for wheezing or shortness of breath.   . losartan-hydrochlorothiazide (HYZAAR) 50-12.5 MG per tablet Take 1 tablet by mouth daily.  . metFORMIN (GLUCOPHAGE) 1000 MG tablet Take 1,000 mg by mouth 2 (two) times daily.  Marland Kitchen tiotropium (SPIRIVA) 18 MCG inhalation capsule Place 18 mcg into inhaler and inhale daily.    FAMILY HISTORY:  Her has no family status information on file.   SOCIAL HISTORY: She  reports that she has quit smoking. She does not have any smokeless tobacco history on file. She reports that she does not drink alcohol or use illicit drugs.  REVIEW OF SYSTEMS:   Constitutional: Negative for fever and chills.  HENT: Negative for congestion and rhinorrhea.  Eyes: Negative for redness and visual disturbance.  Respiratory: Positive for shortness of breath, SOBE, cough and  wheezing.  Cardiovascular: Negative for chest pain and palpitations.  Gastrointestinal: Negative  for nausea , vomiting and abdominal pain and  Loose stools Genitourinary: Negative for dysuria and urgency.  Musculoskeletal: Negative for myalgias and arthralgias.  Skin: Negative for pallor and wound.  Neurological: Negative for dizziness and headaches   SUBJECTIVE:    VITAL SIGNS: BP 163/100 mmHg  Pulse 118  Temp(Src) 98.3 F (36.8 C) (Oral)  Resp 26  Ht 5\' 6"  (1.676 m)  Wt 280 lb (127.007 kg)  BMI 45.21 kg/m2  SpO2 96%  HEMODYNAMICS:    VENTILATOR SETTINGS:    INTAKE / OUTPUT:    PHYSICAL EXAMINATION: General: Moderate respiratory  distress Neuro: AAO X4, no focal deficits HEENT: PERRLA, BiPAP mask, neck is short Cardiovascular:  Tachycardic, S1/S2, no MRG Lungs: Increased WOB, bilateral airflow, breath sounds diminished in the bases, expiratory wheezes in all lung fields Abdomen: Obese, normal bowel sounds Musculoskeletal: +rom, no  deformities Extremities: +2 pulses, no edema Skin: Afebrile to touch  LABS:  BMET  Recent Labs Lab 08/24/15 0135  NA 138  K 3.7  CL 104  CO2 26  BUN 13  CREATININE 0.95  GLUCOSE 238*    Electrolytes  Recent Labs Lab 08/24/15 0135  CALCIUM 9.6    CBC  Recent Labs Lab 08/24/15 0135  WBC 9.7  HGB 14.0  HCT 41.3  PLT 198    Coag's No results for input(s): APTT, INR in the last 168 hours.  Sepsis Markers No results for input(s): LATICACIDVEN, PROCALCITON, O2SATVEN in the last 168 hours.  ABG  Recent Labs Lab 08/24/15 1345  PHART 7.36  PCO2ART 39  PO2ART 52*    Liver Enzymes No results for input(s): AST, ALT, ALKPHOS, BILITOT, ALBUMIN in the last 168 hours.  Cardiac Enzymes  Recent Labs Lab 08/24/15 0135 08/24/15 0437  TROPONINI <0.03 <0.03    Glucose  Recent Labs Lab 08/24/15 0802 08/24/15 0803 08/24/15 1125 08/24/15 1529 08/24/15 1640  GLUCAP 423* 403* 386* 277* 246*    Imaging Dg Chest 1 View  08/24/2015  CLINICAL DATA:  Acute onset of shortness of breath. Initial encounter. EXAM: CHEST 1 VIEW COMPARISON:  Chest radiograph and CTA of the chest performed 08/08/2015 FINDINGS: The lungs are mildly hyperexpanded. Mild left basilar atelectasis is noted. There is no evidence of pleural effusion or pneumothorax. The cardiomediastinal silhouette is within normal limits. No acute osseous abnormalities are seen. IMPRESSION: Lungs mildly hyperexpanded.  Mild left basilar atelectasis noted. Electronically Signed   By: Garald Balding M.D.   On: 08/24/2015 02:30   Dg Chest 2 View  08/24/2015  CLINICAL DATA:  Shortness of breath and wheezing EXAM: CHEST  2 VIEW COMPARISON:  08/24/2015 FINDINGS: The heart size appears normal. There is no pleural effusion or edema. No airspace consolidation identified. Spondylosis is present within the thoracic spine. IMPRESSION: 1. No acute cardiopulmonary abnormalities. Electronically Signed   By: Kerby Moors M.D.    On: 08/24/2015 13:47    STUDIES:  None  CULTURES: MRSA screen negative  ANTIBIOTICS: Azithromycin 07/04>  SIGNIFICANT EVENTS: 07/04>ED with worsening dyspnea>admitted with acute COPD exacerbation  LINES/TUBES: PIVs  DISCUSSION: 55 yo female presenting with acute hypoxemic respiratory failure, and acute COPD exacerbation secondary to non-adherence; there might be a component of OSA/OHS as well.   ASSESSMENT / PLAN:  PULMONARY A: Acute hypoxemic respiratory failure Acute on chronic COPD/astham exacerbation P:   -Continuous BiPAP; titrate to keep SPO2 >=88% -Nebulized steroids and bronchodilators -Levaquin 750 mg daily x 7 days -Solu-medrol 60mg  iv q6h -CXR in am -ABG prn  CARDIOVASCULAR A:  H/O hypertension H/O pulmonary hypertension P:  -Hemodynamics per ICU -Continue home BP meds and prn hydralazine to keep SBP<140  ENDOCRINE A:   T2DM  P:   -Hold oral hypoglycemics -Blood glucose testing with SSI coverage every 4 hrs  DISPOSITION AND FAMILY UPDATE: No family at bedside. Will update once available. Plan of care reviewed with patient  Total CCM time is 55 minutes  Magdalene S. Tukov ANP-BC Pulmonary and Critical Care Medicine [facility] Pager [phone] or [phone]   STAFF NOTE: I, Dr. Vilinda Boehringer have personally reviewed patient's available data, including medical  history, events of note, physical examination and test results as part of my evaluation. I have discussed with NP Nancy Patton and other care providers such as pharmacist, RN and RRT.     A:54 yo with PMHx of COPD now with AECOPD AECOPD Acute Hypoxic Respiratory Failure HTN Obesity  P:   - Bipap, wean as tolerated, improved after 2 hours, Bipap QHS inpatient ONLY - cont with steroids, Bronchodilators, antibiotics.  - may benefit from a sleep study (if one has not been done in the last 2-3 years), but this can be discussed with her Primary Pulmonologist, Dr. Devona Konig.  -  cost of meds/financial hardship is adding to non-compliance, will discuss with social services.    .  Rest per NP/medical resident whose note is outlined above and that I agree with  Critical Care Time devoted to patient care services described in this note is  55 Minutes.   This time reflects time of care of this signee Dr Vilinda Boehringer.  This critical care time does not reflect procedure time, or teaching time or supervisory time of PA/NP/Med-student/Med Resident etc but could involve care discussion time.  Vilinda Boehringer, MD [place] [facility] Pager [phone] (please enter 7-digits) On Call Pager [phone] (please enter 7-digits)  Note: This note was prepared with Dragon dictation along with smaller phrase technology. Any transcriptional errors that result from this process are unintentional.

## 2015-08-24 NOTE — H&P (Signed)
Nancy Patton is an 55 y.o. female.   Chief Complaint: Shortness of breath HPI: The patient with past medical history significant for asthma and diabetes presents to the emergency department complaining of shortness of breath. This is her second emergency department evaluation in 2 weeks for the same complaint. She was not admitted on her previous encounter. She reports feeling somewhat better after steroids the first time but has progressively become more dyspneic since then. She denies cough but admits to runny nose that has been concurrent with her dyspnea. She denies chest pain, nausea, vomiting or diaphoresis. She units to being exposed to secondhand smoke but she is not a smoker herself. Also, the patient has not had her maintenance inhaler for approximately one month due to cost. Despite multiple breathing treatments, Solu-Medrol and magnesium patient continued to have wheezing and shortness of breath as well as hypoxia which prompted the emergency department staff to call for admission.  Past Medical History  Diagnosis Date  . Asthma   . Hypertension   . Diabetes mellitus without complication ([place])   . Obesity   . Pulmonary hypertension [facility])     Past Surgical History  Procedure Laterality Date  . Cesarean section    . Cataract extraction      Family History  Problem Relation Age of Onset  . Cancer Neg Hx   . Diabetes Neg Hx   . Heart failure Neg Hx   . Hypertension Neg Hx    Social History:  reports that she has quit smoking. She does not have any smokeless tobacco history on file. She reports that she does not drink alcohol or use illicit drugs.  Allergies: No Known Allergies  Prior to Admission medications   Medication Sig Start Date End Date Taking? Authorizing Provider  albuterol (PROVENTIL HFA;VENTOLIN HFA) 108 (90 BASE) MCG/ACT inhaler Inhale 4-6 puffs by mouth every 4 hours as needed for wheezing, cough, and/or shortness of breath 07/08/14  Yes Hinda Kehr, MD  BREO  ELLIPTA 200-25 MCG/INH AEPB Inhale 1 puff into the lungs daily. 09/14/14  Yes Historical Provider, MD  citalopram (CELEXA) 20 MG tablet Take 20 mg by mouth daily. 09/15/14  Yes Historical Provider, MD  levalbuterol Penne Lash) 1.25 MG/3ML nebulizer solution Take 3 mLs by nebulization every 8 (eight) hours as needed for wheezing or shortness of breath.  09/18/14  Yes Historical Provider, MD  losartan-hydrochlorothiazide (HYZAAR) 50-12.5 MG per tablet Take 1 tablet by mouth daily. 09/15/14  Yes Historical Provider, MD  metFORMIN (GLUCOPHAGE) 1000 MG tablet Take 1,000 mg by mouth 2 (two) times daily. 09/09/14  Yes Historical Provider, MD  tiotropium (SPIRIVA) 18 MCG inhalation capsule Place 18 mcg into inhaler and inhale daily.   Yes Historical Provider, MD     Results for orders placed or performed during the hospital encounter of 08/24/15 (from the past 48 hour(s))  Basic metabolic panel     Status: Abnormal   Collection Time: 08/24/15  1:35 AM  Result Value Ref Range   Sodium 138 135 - 145 mmol/L   Potassium 3.7 3.5 - 5.1 mmol/L   Chloride 104 101 - 111 mmol/L   CO2 26 22 - 32 mmol/L   Glucose, Bld 238 (H) 65 - 99 mg/dL   BUN 13 6 - 20 mg/dL   Creatinine, Ser 0.95 0.44 - 1.00 mg/dL   Calcium 9.6 8.9 - 10.3 mg/dL   GFR calc non Af Amer >60 >60 mL/min   GFR calc Af Amer >60 >60 mL/min  Comment: (NOTE) The eGFR has been calculated using the CKD EPI equation. This calculation has not been validated in all clinical situations. eGFR's persistently <60 mL/min signify possible Chronic Kidney Disease.    Anion gap 8 5 - 15  CBC     Status: None   Collection Time: 08/24/15  1:35 AM  Result Value Ref Range   WBC 9.7 3.6 - 11.0 K/uL   RBC 4.81 3.80 - 5.20 MIL/uL   Hemoglobin 14.0 12.0 - 16.0 g/dL   HCT 41.3 35.0 - 47.0 %   MCV 85.9 80.0 - 100.0 fL   MCH 29.1 26.0 - 34.0 pg   MCHC 33.9 32.0 - 36.0 g/dL   RDW 14.0 11.5 - 14.5 %   Platelets 198 150 - 440 K/uL  Troponin I     Status: None    Collection Time: 08/24/15  1:35 AM  Result Value Ref Range   Troponin I <0.03 <0.03 ng/mL  Troponin I     Status: None   Collection Time: 08/24/15  4:37 AM  Result Value Ref Range   Troponin I <0.03 <0.03 ng/mL  Blood gas, venous     Status: Abnormal   Collection Time: 08/24/15  6:01 AM  Result Value Ref Range   pH, Ven 7.33 7.320 - 7.430   pCO2, Ven 44 44.0 - 60.0 mmHg   pO2, Ven 57.0 (H) 31.0 - 45.0 mmHg   Bicarbonate 23.2 21.0 - 28.0 mEq/L   Acid-base deficit 2.8 (H) 0.0 - 2.0 mmol/L   O2 Saturation 87.0 %   Patient temperature 37.0    Collection site VENOUS    Sample type VENOUS    Dg Chest 1 View  08/24/2015  CLINICAL DATA:  Acute onset of shortness of breath. Initial encounter. EXAM: CHEST 1 VIEW COMPARISON:  Chest radiograph and CTA of the chest performed 08/08/2015 FINDINGS: The lungs are mildly hyperexpanded. Mild left basilar atelectasis is noted. There is no evidence of pleural effusion or pneumothorax. The cardiomediastinal silhouette is within normal limits. No acute osseous abnormalities are seen. IMPRESSION: Lungs mildly hyperexpanded.  Mild left basilar atelectasis noted. Electronically Signed   By: Garald Balding M.D.   On: 08/24/2015 02:30    Review of Systems  Constitutional: Negative for fever and chills.  HENT: Negative for sore throat and tinnitus.   Eyes: Negative for blurred vision and redness.  Respiratory: Positive for shortness of breath. Negative for cough, sputum production and wheezing.   Cardiovascular: Negative for chest pain, palpitations, orthopnea and PND.  Gastrointestinal: Negative for nausea, vomiting, abdominal pain and diarrhea.  Genitourinary: Negative for dysuria, urgency and frequency.  Musculoskeletal: Negative for myalgias and joint pain.  Skin: Negative for rash.       No lesions  Neurological: Negative for speech change, focal weakness and weakness.  Endo/Heme/Allergies: Does not bruise/bleed easily.       No temperature intolerance   Psychiatric/Behavioral: Negative for depression and suicidal ideas.    Blood pressure 138/78, pulse 105, resp. rate 18, height '5\' 6"'  (1.676 m), weight 127.007 kg (280 lb), SpO2 90 %. Physical Exam  Vitals reviewed. Constitutional: She is oriented to person, place, and time. She appears well-developed and well-nourished. No distress.  HENT:  Head: Normocephalic and atraumatic.  Mouth/Throat: Oropharynx is clear and moist.  Eyes: Conjunctivae and EOM are normal. Pupils are equal, round, and reactive to light. No scleral icterus.  Neck: Normal range of motion. Neck supple. No JVD present. No tracheal deviation present. No thyromegaly present.  Cardiovascular: Normal rate, regular rhythm and normal heart sounds.  Exam reveals no gallop and no friction rub.   No murmur heard. Respiratory: She is in respiratory distress. She has wheezes.  GI: Soft. Bowel sounds are normal. She exhibits no distension. There is no tenderness.  Genitourinary:  Deferred  Musculoskeletal: Normal range of motion. She exhibits no edema.  Lymphadenopathy:    She has no cervical adenopathy.  Neurological: She is alert and oriented to person, place, and time. No cranial nerve deficit. She exhibits normal muscle tone.  Skin: Skin is warm and dry. No rash noted. No erythema.  Psychiatric: She has a normal mood and affect. Her behavior is normal. Judgment and thought content normal.     Assessment/Plan This is a 55 year old female admitted for acute exacerbation of COPD with asthma. 1. COPD exacerbation: With hypoxemia. The patient carries a diagnosis of asthma but it is unclear if this is correct. Continue to taper steroid dosing. Azithromycin for anti-inflammatory effect. Continue anticholinergic; she will need an inhaled corticosteroid. (She had been on Spiriva and Breo but has not has the latter in a while due to cost. Will put her back on high dose Advair in addition to Spiriva.  Consider Anoro plus Flovent or  Symbicort as well, but again, cost may be an issue).  Albuterol scheduled every 4 hours. Supplemental oxygen for oximetry goal of 88-92%. Arrange for spirometry. 2. Diabetes mellitus type 2: Hold metformin while hospitalized; I have placed the patient on sliding scale insulin due to steroid therapy. Carb controlled diet 3. Essential hypertension: Controlled; continue losartan with hydrocodone thiazide 4. Depression: Stable; continue Celexa 5. Morbid obesity: BMI is 45.3; encourage healthy diet and exercise 6. DVT prophylaxis: Lovenox 7. GI prophylaxis: None The patient is a full code. Time spent on admission orders and patient care approximately 45 minutes  Harrie Foreman, MD 08/24/2015, 6:36 AM

## 2015-08-24 NOTE — Progress Notes (Signed)
Called to patient's room by patient's RN. Pt continues to work hard and oxygen saturations are now 90% on 4lpm [place]. Patient placed on 100% NRB mask and saturations up to 98%    Rapid response called and patient transferred to CCU on 100% NRB mask to be placed on BIPAP.

## 2015-08-24 NOTE — Progress Notes (Signed)
Patient  transferred to ICU to be placed on Bipap. Awake and alert, denies any complaints, remains tachypneic , but slight improvement, HR down to 112, RR down to 25, but quickly elevates back to 30 with any movement, sats 96% , Bil wheezing noted

## 2015-08-24 NOTE — ED Notes (Signed)
After breathing treatment complete, pt stated she was able to lay on stretcher.

## 2015-08-24 NOTE — ED Provider Notes (Signed)
[facility] Emergency Department Provider Note   ____________________________________________  Time seen: Approximately 155 AM  I have reviewed the triage vital signs and the nursing notes.   HISTORY  Chief Complaint Respiratory Distress    HPI Nancy Patton is a 55 y.o. female who comes into the hospital today with shortness of breath. The patient has a history of asthma and reports that she's been having some difficulty breathing for the last couple of days. She also reports that she's had some pressure in her chest and indigestion couple of days ago. She reports that the chest pain is not back currently. She has been using her inhaler at home but reports it is not getting any better. The patient's shortness of breath was worse this morning so she decided to come in and get checked out. The patient has a stuffy nose. She reports that she couldn't really walk around much without getting short of breath. The patient denies any sick contacts. She is here for evaluation.   Past Medical History  Diagnosis Date  . Asthma   . Hypertension   . Diabetes mellitus without complication ([place])   . Obesity   . Pulmonary hypertension [facility])     Patient Active Problem List   Diagnosis Date Noted  . COPD exacerbation ([place]) 08/24/2015  . HTN (hypertension) 07/10/2015  . Type 2 diabetes mellitus ([place]) 07/10/2015  . Depression 07/10/2015  . Acute respiratory distress ([facility]) 09/26/2014  . Asthmatic bronchitis with acute exacerbation 09/26/2014  . Asthmatic bronchitis with exacerbation 09/26/2014    Past Surgical History  Procedure Laterality Date  . Cesarean section    . Cataract extraction      No current outpatient prescriptions on file.  Allergies Review of patient's allergies indicates no known allergies.  Family History  Problem Relation Age of Onset  . Cancer Neg Hx   . Diabetes Neg Hx   . Heart failure Neg Hx   . Hypertension Neg Hx     Social  History Social History  Substance Use Topics  . Smoking status: Former Research scientist (life sciences)  . Smokeless tobacco: None  . Alcohol Use: No     Comment: 2nd hand smoke    Review of Systems Constitutional: No fever/chills Eyes: No visual changes. ENT: No sore throat. Cardiovascular:  chest pain. Respiratory: shortness of breath. Gastrointestinal: No abdominal pain.  No nausea, no vomiting.  No diarrhea.  No constipation. Genitourinary: Negative for dysuria. Musculoskeletal: Negative for back pain. Skin: Negative for rash. Neurological: Negative for headaches, focal weakness or numbness.  10-point ROS otherwise negative.  ____________________________________________   PHYSICAL EXAM:  VITAL SIGNS: ED Triage Vitals  Enc Vitals Group     BP 08/24/15 0126 138/67 mmHg     Pulse Rate 08/24/15 0126 125     Resp 08/24/15 0126 40     Temp --      Temp src --      SpO2 08/24/15 0126 88 %     Weight 08/24/15 0126 280 lb (127.007 kg)     Height 08/24/15 0126 5\' 6"  (1.676 m)     Head Cir --      Peak Flow --      Pain Score 08/24/15 0127 5     Pain Loc --      Pain Edu? --      Excl. in [place]? --     Constitutional: Alert and oriented. Well appearing and in moderate respiratory distress. Eyes: Conjunctivae are normal. PERRL.  EOMI. Head: Atraumatic. Nose: No congestion/rhinnorhea. Mouth/Throat: Mucous membranes are moist.  Oropharynx non-erythematous. Cardiovascular: Normal rate, regular rhythm. Grossly normal heart sounds.  Good peripheral circulation. Respiratory: increased respiratory effort.  No retractions. Expiratory wheezes throughout Gastrointestinal: Soft and nontender. No distention. Positive bowel sounds Musculoskeletal: No lower extremity tenderness nor edema.   Neurologic:  Normal speech and language.  Skin:  Skin is warm, dry and intact.  Psychiatric: Mood and affect are normal.   ____________________________________________   LABS (all labs ordered are listed, but only  abnormal results are displayed)  Labs Reviewed  BASIC METABOLIC PANEL - Abnormal; Notable for the following:    Glucose, Bld 238 (*)    All other components within normal limits  BLOOD GAS, VENOUS - Abnormal; Notable for the following:    pO2, Ven 57.0 (*)    Acid-base deficit 2.8 (*)    All other components within normal limits  CBC  TROPONIN I  TROPONIN I  TSH  HEMOGLOBIN A1C   ____________________________________________  EKG  ED ECG REPORT I, Loney Hering, the attending physician, personally viewed and interpreted this ECG.   Date: 08/24/2015  EKG Time: 129  Rate: 110  Rhythm: sinus tachycardia  Axis: normal  Intervals:none  ST&T Change: none  ____________________________________________  RADIOLOGY  CXR: Lungs mildly hyperexpanded, mild left basilar atelectasis noted ____________________________________________   PROCEDURES  Procedure(s) performed: None  Procedures  Critical Care performed: No  ____________________________________________   INITIAL IMPRESSION / ASSESSMENT AND PLAN / ED COURSE  Pertinent labs & imaging results that were available during my care of the patient were reviewed by me and considered in my medical decision making (see chart for details).  This is a 55 year old female who comes into the hospital today with some shortness of breath. The patient is having an asthma exacerbation. The patient received an albuterol as well as DuoNeb treatments prior to my initial evaluation. I did give the patient some Solu-Medrol as well as some magnesium sulfate and another albuterol treatment. The patient remained hypoxic at 90% O2 while on 2 L by nasal cannula. I did give the patient some more albuterol treatments but when she remained hypoxic I decided to admit the patient to the hospitalist service. The patient's blood gas does not show any elevated PCO2 at this time. She'll be admitted to the hospitalist  service. ____________________________________________   FINAL CLINICAL IMPRESSION(S) / ED DIAGNOSES  Final diagnoses:  Dyspnea  Asthma exacerbation  Hypoxia      NEW MEDICATIONS STARTED DURING THIS VISIT:  Current Discharge Medication List       Note:  This document was prepared using Dragon voice recognition software and may include unintentional dictation errors.    Loney Hering, MD 08/24/15 [phone]

## 2015-08-24 NOTE — ED Notes (Signed)
Pt sitting on side of bed at this time.

## 2015-08-24 NOTE — ED Notes (Signed)
Pt with shob. Pt not able to speak in full sentences. Albuterol neb started in triage, pt with audible inspiratory wheezing noted in upper lobes, very diminished lower lobes. Pt tripoding in triage.

## 2015-08-24 NOTE — Plan of Care (Signed)
Problem: Consults Goal: Respiratory Problems Patient Education See Patient Education Module for education specifics. Outcome: Not Met (add Reason) Patient educated on Bipap and need to rest and conserve energy

## 2015-08-24 NOTE — Significant Event (Signed)
RRT paged for this patient in Room 126.  Patient was in respiratory distress with audible wheezing on 100% NRB.  Patient already received 60 mg solumedrol and Duoneb treatments with no improvement in respiratory status.  Sudini notified and consulted Magdalene,  ICU NP of patient's distress and subsequently patient was transferred to [facility] for [place].  CXR negative, ABG significant for hypoxia (po2 56).  Patient then transferred to [facility] and placed on Bipap.

## 2015-08-24 NOTE — Progress Notes (Signed)
Transfer patient to CCU. Report given to Cyra RN.

## 2015-08-24 NOTE — ED Notes (Signed)
Called respiratory to pick up  VBG that is at bedside.

## 2015-08-24 NOTE — Progress Notes (Signed)
[place] at [place] NAME: Nancy Patton    MR#:  [id number]  DATE OF BIRTH:  1960-09-25  SUBJECTIVE:  CHIEF COMPLAINT:   Chief Complaint  Patient presents with  . Respiratory Distress   Worsening SOB  REVIEW OF SYSTEMS:    Review of Systems  Constitutional: Positive for malaise/fatigue. Negative for fever and chills.  HENT: Negative for sore throat.   Eyes: Negative for blurred vision, double vision and pain.  Respiratory: Positive for cough, shortness of breath and wheezing. Negative for hemoptysis.   Cardiovascular: Negative for chest pain, palpitations, orthopnea and leg swelling.  Gastrointestinal: Negative for nausea, vomiting, abdominal pain, diarrhea and constipation.  Musculoskeletal: Negative for back pain.  Skin: Negative for rash.  Neurological: Positive for weakness. Negative for headaches.  Psychiatric/Behavioral: The patient is nervous/anxious.     DRUG ALLERGIES:  No Known Allergies  VITALS:  Blood pressure 131/72, pulse 102, temperature 98.3 F (36.8 C), temperature source Oral, resp. rate 20, height 5\' 6"  (1.676 m), weight 127.007 kg (280 lb), SpO2 94 %.  PHYSICAL EXAMINATION:   Physical Exam  GENERAL:  55 y.o.-year-old patient lying in the bed with respiratory distress EYES: Pupils equal, round, reactive to light and accommodation. No scleral icterus. Extraocular muscles intact.  HEENT: Head atraumatic, normocephalic. Oropharynx and nasopharynx clear.  NECK:  Supple, no jugular venous distention. No thyroid enlargement, no tenderness.  LUNGS: Decreased breath sounds. Bilateral wheezing CARDIOVASCULAR: S1, S2 normal. No murmurs, rubs, or gallops.  ABDOMEN: Soft, nontender, nondistended. Bowel sounds present. No organomegaly or mass.  EXTREMITIES: No cyanosis, clubbing or edema b/l.    NEUROLOGIC: Cranial nerves II through XII are intact. No focal Motor or sensory deficits b/l.   PSYCHIATRIC: The  patient is alert and oriented x 3. Anxious SKIN: No obvious rash, lesion, or ulcer.   LABORATORY PANEL:   CBC  Recent Labs Lab 08/24/15 0135  WBC 9.7  HGB 14.0  HCT 41.3  PLT 198   ------------------------------------------------------------------------------------------------------------------ Chemistries   Recent Labs Lab 08/24/15 0135  NA 138  K 3.7  CL 104  CO2 26  GLUCOSE 238*  BUN 13  CREATININE 0.95  CALCIUM 9.6   ------------------------------------------------------------------------------------------------------------------  Cardiac Enzymes  Recent Labs Lab 08/24/15 0437  TROPONINI <0.03   ------------------------------------------------------------------------------------------------------------------  RADIOLOGY:  Dg Chest 1 View  08/24/2015  CLINICAL DATA:  Acute onset of shortness of breath. Initial encounter. EXAM: CHEST 1 VIEW COMPARISON:  Chest radiograph and CTA of the chest performed 08/08/2015 FINDINGS: The lungs are mildly hyperexpanded. Mild left basilar atelectasis is noted. There is no evidence of pleural effusion or pneumothorax. The cardiomediastinal silhouette is within normal limits. No acute osseous abnormalities are seen. IMPRESSION: Lungs mildly hyperexpanded.  Mild left basilar atelectasis noted. Electronically Signed   By: Garald Balding M.D.   On: 08/24/2015 02:30     ASSESSMENT AND PLAN:   * Acute hypoxic resp failure due to COPD exacerbation Left lower lobe pneumonia - Worsening - IV steroids, Antibiotics - Scheduled Nebulizers - Inhalers - Wean O2 as tolerated - Consult pulmonary if no improvement - Check ABG. Repeat CXR. Transfer to [facility] if worse for BIpap. Repeat breathing treatment. Scheduled  * DM, Uncontrolled due to steroids. SSI Add lantus. Recent HbA1c was 7.7  * Hypertension  * DVT prophylaxis   All the records are reviewed and case discussed with Care Management/Social Workerr. Management plans  discussed with the patient, family and they are in agreement.  CODE STATUS: FULL CODE  DVT Prophylaxis: SCDs  TOTAL CC TIME TAKING CARE OF THIS PATIENT: 35 minutes.   POSSIBLE D/C IN 2-3 DAYS, DEPENDING ON CLINICAL CONDITION.  Hillary Bow R M.D on 08/24/2015 at 1:20 PM  Between 7am to 6pm - Pager - [phone]  After 6pm go to [email or web address] - password [email or web address] [facility]  Office  [phone]  CC: Primary care physician; Perrin Maltese, MD  Note: This dictation was prepared with Dragon dictation along with smaller phrase technology. Any transcriptional errors that result from this process are unintentional.

## 2015-08-24 NOTE — ED Notes (Signed)
Pt able to talk in complete sentences but appears to be working hard to breath. Placed on nasal cannula. Dr. Dahlia Client notified and ordered a breathing treatment. Pt states congestion for a few days and middle chest pain and back pain.

## 2015-08-24 NOTE — Progress Notes (Signed)
Reported to Dr Darvin Neighbours that patient had a blood sugar of 423 and 403. Ordered 20 units of novolog once

## 2015-08-25 DIAGNOSIS — R062 Wheezing: Secondary | ICD-10-CM

## 2015-08-25 DIAGNOSIS — R0600 Dyspnea, unspecified: Secondary | ICD-10-CM

## 2015-08-25 LAB — CBC
HCT: 40.5 % (ref 35.0–47.0)
Hemoglobin: 13.7 g/dL (ref 12.0–16.0)
MCH: 29.4 pg (ref 26.0–34.0)
MCHC: 33.9 g/dL (ref 32.0–36.0)
MCV: 86.9 fL (ref 80.0–100.0)
Platelets: 203 10*3/uL (ref 150–440)
RBC: 4.66 MIL/uL (ref 3.80–5.20)
RDW: 14.3 % (ref 11.5–14.5)
WBC: 16.2 10*3/uL — ABNORMAL HIGH (ref 3.6–11.0)

## 2015-08-25 LAB — GLUCOSE, CAPILLARY
Glucose-Capillary: 300 mg/dL — ABNORMAL HIGH (ref 65–99)
Glucose-Capillary: 501 mg/dL (ref 65–99)
Glucose-Capillary: 327 mg/dL — ABNORMAL HIGH (ref 65–99)
Glucose-Capillary: 212 mg/dL — ABNORMAL HIGH (ref 65–99)
Glucose-Capillary: 245 mg/dL — ABNORMAL HIGH (ref 65–99)

## 2015-08-25 LAB — BASIC METABOLIC PANEL
Anion gap: 10 (ref 5–15)
BUN: 21 mg/dL — ABNORMAL HIGH (ref 6–20)
CO2: 26 mmol/L (ref 22–32)
Calcium: 9.6 mg/dL (ref 8.9–10.3)
Chloride: 102 mmol/L (ref 101–111)
Creatinine, Ser: 0.87 mg/dL (ref 0.44–1.00)
GFR calc Af Amer: >60 mL/min (ref >60)
GFR calc non Af Amer: >60 mL/min (ref >60)
Glucose, Bld: 327 mg/dL — ABNORMAL HIGH (ref 65–99)
Potassium: 4.2 mmol/L (ref 3.5–5.1)
Sodium: 138 mmol/L (ref 135–145)

## 2015-08-25 LAB — MAGNESIUM: Magnesium: 1.9 mg/dL (ref 1.7–2.4)

## 2015-08-25 LAB — PHOSPHORUS: Phosphorus: 4.1 mg/dL (ref 2.5–4.6)

## 2015-08-25 MED ORDER — PREDNISONE 20 MG PO TABS
40.0000 mg | ORAL_TABLET | Freq: Every day | ORAL | Status: AC
  Administered 2015-08-26 – 2015-08-27 (×2): 40 mg via ORAL
  Filled 2015-08-25 (×2): qty 2

## 2015-08-25 MED ORDER — PREDNISONE 10 MG PO TABS: 10.0000 mg | ORAL_TABLET | Freq: Every day | ORAL | Status: DC

## 2015-08-25 MED ORDER — ENOXAPARIN SODIUM 40 MG/0.4ML ~~LOC~~ SOLN
40.0000 mg | Freq: Two times a day (BID) | SUBCUTANEOUS | Status: DC
  Administered 2015-08-25 – 2015-08-28 (×8): 40 mg via SUBCUTANEOUS
  Filled 2015-08-25 (×8): qty 0.4

## 2015-08-25 MED ORDER — METFORMIN HCL 500 MG PO TABS
1000.0000 mg | ORAL_TABLET | Freq: Two times a day (BID) | ORAL | Status: DC
  Administered 2015-08-25 – 2015-08-29 (×8): 1000 mg via ORAL
  Filled 2015-08-25 (×6): qty 2
  Filled 2015-08-25: qty 1
  Filled 2015-08-25 (×2): qty 2

## 2015-08-25 MED ORDER — PREDNISONE 20 MG PO TABS: 20.0000 mg | ORAL_TABLET | Freq: Every day | ORAL | Status: DC

## 2015-08-25 MED ORDER — IPRATROPIUM-ALBUTEROL 0.5-2.5 (3) MG/3ML IN SOLN
3.0000 mL | Freq: Four times a day (QID) | RESPIRATORY_TRACT | Status: DC
  Administered 2015-08-25 – 2015-08-26 (×3): 3 mL via RESPIRATORY_TRACT
  Filled 2015-08-25 (×4): qty 3

## 2015-08-25 MED ORDER — PREDNISONE 20 MG PO TABS
30.0000 mg | ORAL_TABLET | Freq: Every day | ORAL | Status: DC
  Administered 2015-08-28: 30 mg via ORAL
  Filled 2015-08-25: qty 1

## 2015-08-25 MED ORDER — IPRATROPIUM-ALBUTEROL 0.5-2.5 (3) MG/3ML IN SOLN
3.0000 mL | RESPIRATORY_TRACT | Status: DC | PRN
  Administered 2015-08-25 – 2015-08-26 (×2): 3 mL via RESPIRATORY_TRACT
  Filled 2015-08-25: qty 3

## 2015-08-25 MED ORDER — CETYLPYRIDINIUM CHLORIDE 0.05 % MT LIQD
7.0000 mL | Freq: Two times a day (BID) | OROMUCOSAL | Status: DC
  Administered 2015-08-25 – 2015-08-28 (×6): 7 mL via OROMUCOSAL

## 2015-08-25 NOTE — Progress Notes (Signed)
Chaplain rounded the unit and provided a compassionate presence with support for the patient who stated that she was feeling better than yesterday.  That she would want information telling her what triggers her continued episodes. Nancy Patton [phone]

## 2015-08-25 NOTE — Care Management (Signed)
Patient transferred to icu stepdown from 1C due to increasing respiratory difficulty from asthma exacerbation and need for continuous bipap.  Has home nebulizer.  She has had  home 02 through [employer].  She was able to have it discontinued and no longer has it in the home.  She is employed and has insurance.  The copay on her maintenance inhalers (Spiriva and Breo)  are excessive so has not been able to refill.  She has some Spiriva on hand but no Breo.  Says the copay on her Spiriva is 80 dollars until she meets her deductible.  Her Breo was 200 dollars.  She says she has never  used any Pharmacist, community co pay coupons.  her pharmacy [facility] says that there are no coupons in the patient's file.  Provided patient with on line coupons for "no more than 10 dollars" for 12 month coupons.  Spoke with [employer] representative.  The meds are on preferred list and copays for spiriva and breo are 60 dollars each. It is very possible that patient had deductible first of the year to be met.

## 2015-08-25 NOTE — Progress Notes (Signed)
55 y/o F on Lovenox 40 mg daily for DVT prophylaxis. Due to weight > 100 kg and BMI > 40, will increase Lovenox dosing to 40 mg q 12 hours.   Ulice Dash, PharmD Clinical Pharmacist

## 2015-08-25 NOTE — Progress Notes (Signed)
[place] at [place] NAME: Nancy Patton    MR#:  [id number]  DATE OF BIRTH:  February 07, 1961  SUBJECTIVE:  CHIEF COMPLAINT:   Chief Complaint  Patient presents with  . Respiratory Distress   On bipap overnight. Sats 87% on 6 L O2. Afebrile. Still has SOB.  REVIEW OF SYSTEMS:    Review of Systems  Constitutional: Positive for malaise/fatigue. Negative for fever and chills.  HENT: Negative for sore throat.   Eyes: Negative for blurred vision, double vision and pain.  Respiratory: Positive for cough, shortness of breath and wheezing. Negative for hemoptysis.   Cardiovascular: Negative for chest pain, palpitations, orthopnea and leg swelling.  Gastrointestinal: Negative for nausea, vomiting, abdominal pain, diarrhea and constipation.  Musculoskeletal: Negative for back pain.  Skin: Negative for rash.  Neurological: Positive for weakness. Negative for headaches.  Psychiatric/Behavioral: The patient is nervous/anxious.     DRUG ALLERGIES:  No Known Allergies  VITALS:  Blood pressure 139/82, pulse 103, temperature 97.1 F (36.2 C), temperature source Axillary, resp. rate 21, height 5\' 6"  (1.676 m), weight 127.007 kg (280 lb), SpO2 92 %.  PHYSICAL EXAMINATION:   Physical Exam  GENERAL:  55 y.o.-year-old patient lying in the bed with respiratory distress EYES: Pupils equal, round, reactive to light and accommodation. No scleral icterus. Extraocular muscles intact.  HEENT: Head atraumatic, normocephalic. Oropharynx and nasopharynx clear.  NECK:  Supple, no jugular venous distention. No thyroid enlargement, no tenderness.  LUNGS: Decreased breath sounds. Bilateral wheezing CARDIOVASCULAR: S1, S2 normal. No murmurs, rubs, or gallops.  ABDOMEN: Soft, nontender, nondistended. Bowel sounds present. No organomegaly or mass.  EXTREMITIES: No cyanosis, clubbing or edema b/l.    NEUROLOGIC: Cranial nerves II through XII are intact. No  focal Motor or sensory deficits b/l.   PSYCHIATRIC: The patient is alert and oriented x 3. Anxious SKIN: No obvious rash, lesion, or ulcer.   LABORATORY PANEL:   CBC  Recent Labs Lab 08/25/15 0311  WBC 16.2*  HGB 13.7  HCT 40.5  PLT 203   ------------------------------------------------------------------------------------------------------------------ Chemistries   Recent Labs Lab 08/25/15 0311  NA 138  K 4.2  CL 102  CO2 26  GLUCOSE 327*  BUN 21*  CREATININE 0.87  CALCIUM 9.6  MG 1.9   ------------------------------------------------------------------------------------------------------------------  Cardiac Enzymes  Recent Labs Lab 08/24/15 0437  TROPONINI <0.03   ------------------------------------------------------------------------------------------------------------------  RADIOLOGY:  Dg Chest 1 View  08/24/2015  CLINICAL DATA:  Acute onset of shortness of breath. Initial encounter. EXAM: CHEST 1 VIEW COMPARISON:  Chest radiograph and CTA of the chest performed 08/08/2015 FINDINGS: The lungs are mildly hyperexpanded. Mild left basilar atelectasis is noted. There is no evidence of pleural effusion or pneumothorax. The cardiomediastinal silhouette is within normal limits. No acute osseous abnormalities are seen. IMPRESSION: Lungs mildly hyperexpanded.  Mild left basilar atelectasis noted. Electronically Signed   By: Garald Balding M.D.   On: 08/24/2015 02:30   Dg Chest 2 View  08/24/2015  CLINICAL DATA:  Shortness of breath and wheezing EXAM: CHEST  2 VIEW COMPARISON:  08/24/2015 FINDINGS: The heart size appears normal. There is no pleural effusion or edema. No airspace consolidation identified. Spondylosis is present within the thoracic spine. IMPRESSION: 1. No acute cardiopulmonary abnormalities. Electronically Signed   By: Kerby Moors M.D.   On: 08/24/2015 13:47     ASSESSMENT AND PLAN:   * Acute hypoxic resp failure due to COPD exacerbation Left lower  lobe pneumonia - Worsening -  IV steroids, Antibiotics. Change to PO prednisone - Scheduled Nebulizers. - Inhalers. - Wean O2 as tolerated. - Consult pulmonary if no improvement. - Check ABG. Repeat CXR. - Still critically ill with low sats on 6 L O2.  * DM, Uncontrolled due to steroids. SSI Added lantus. Recent HbA1c was 7.7  * Hypertension On meds  * DVT prophylaxis   All the records are reviewed and case discussed with Care Management/Social Workerr. Management plans discussed with the patient, family and they are in agreement.  CODE STATUS: FULL CODE  DVT Prophylaxis: SCDs  TOTAL CC TIME TAKING CARE OF THIS PATIENT: 34 minutes.    Hillary Bow R M.D on 08/25/2015 at 12:38 PM  Between 7am to 6pm - Pager - [phone]  After 6pm go to [email or web address] - password [email or web address] [facility]  Office  [phone]  CC: Primary care physician; Perrin Maltese, MD  Note: This dictation was prepared with Dragon dictation along with smaller phrase technology. Any transcriptional errors that result from this process are unintentional.

## 2015-08-25 NOTE — Progress Notes (Signed)
Inpatient Diabetes Program Recommendations  AACE/ADA: New Consensus Statement on Inpatient Glycemic Control (2015)  Target Ranges:  Prepandial:   less than 140 mg/dL      Peak postprandial:   less than 180 mg/dL (1-2 hours)      Critically ill patients:  140 - 180 mg/dL   Lab Results  Component Value Date   GLUCAP 300* 08/25/2015   HGBA1C 10.1* 08/24/2015    Review of Glycemic Control  Results for TARINI, INACIO (MRN [id number]) as of 08/25/2015 08:12  Ref. Range 08/24/2015 11:25 08/24/2015 15:29 08/24/2015 16:40 08/24/2015 22:02 08/25/2015 06:59  Glucose-Capillary Latest Ref Range: 65-99 mg/dL 386 (H) 277 (H) 246 (H) 321 (H) 300 (H)    Diabetes history: Type 2 Outpatient Diabetes medications: Glucophage 1000mg  bid Current orders for Inpatient glycemic control: Novolog moderate correction tid, Novolog 0-5 units qhs, Lantus 15 units qhs  * steroids q8h  Inpatient Diabetes Program Recommendations:CBG very high, please consider increasing Novolog correction to resistant (0-20 units) tid, increase Lantus to 25 units qhs (0.2units/kg) and add Novolog 10 units tid (hold if she eats less than 50%)  Gentry Fitz, RN, BA, [place], CDE Diabetes Coordinator Inpatient Diabetes Program  [phone] (Team Pager) [phone] ([place]) 08/25/2015 8:15 AM

## 2015-08-25 NOTE — Progress Notes (Signed)
This RN spoke with Dr. Stevenson Clinch in regards to pt CBG 501. Per Dr. Stevenson Clinch go ahead and give 15 units on insulin and then recheck in several hours. Pt no longer on IV Solumedrol and pt to get Lantus soon. Sadrac Zeoli E 11:20 AM 08/25/2015

## 2015-08-25 NOTE — Progress Notes (Signed)
PULMONARY / CRITICAL CARE MEDICINE   Name: Nancy Patton MRN: [id number] DOB: [birth year]    ADMISSION DATE:  08/24/2015   CONSULTATION DATE:  08/24/2015  REFERRING MD:  Dr. Hillary Bow  CHIEF COMPLAINT:  Worsening dyspnea  Brief HPI: 55 yo female with a PMH of asthma, hypertension, obesity, T2DM and pulmonary hypertension who presents with worsening dyspnea. She states she is usually dyspneic with exertion at baseline but symptoms got worse over the last three days and she became severely dyspneic at rest hence she decided to come to the ED. Dyspnea was associated with a mildly productive cough.At the ED, she was hypoxic with SPO2 in the 80s, admitted to the medical floor, with progressive decompensation requiring   SUBJECTIVE: Tolerated bipap overnight, now on 6L [place], with acceptable saturations, still with some wheezing.   PAST MEDICAL HISTORY :  She  has a past medical history of Asthma; Hypertension; Diabetes mellitus without complication ([place]); Obesity; and Pulmonary hypertension ([place]).  PAST SURGICAL HISTORY: She  has past surgical history that includes Cesarean section and Cataract extraction.  No Known Allergies  No current facility-administered medications on file prior to encounter.   Current Outpatient Prescriptions on File Prior to Encounter  Medication Sig  . albuterol (PROVENTIL HFA;VENTOLIN HFA) 108 (90 BASE) MCG/ACT inhaler Inhale 4-6 puffs by mouth every 4 hours as needed for wheezing, cough, and/or shortness of breath  . BREO ELLIPTA 200-25 MCG/INH AEPB Inhale 1 puff into the lungs daily.  . citalopram (CELEXA) 20 MG tablet Take 20 mg by mouth daily.  Marland Kitchen levalbuterol (XOPENEX) 1.25 MG/3ML nebulizer solution Take 3 mLs by nebulization every 8 (eight) hours as needed for wheezing or shortness of breath.   . losartan-hydrochlorothiazide (HYZAAR) 50-12.5 MG per tablet Take 1 tablet by mouth daily.  . metFORMIN (GLUCOPHAGE) 1000 MG tablet Take 1,000 mg by mouth 2 (two)  times daily.  Marland Kitchen tiotropium (SPIRIVA) 18 MCG inhalation capsule Place 18 mcg into inhaler and inhale daily.    FAMILY HISTORY:  Her has no family status information on file.   SOCIAL HISTORY: She  reports that she has quit smoking. She does not have any smokeless tobacco history on file. She reports that she does not drink alcohol or use illicit drugs.  REVIEW OF SYSTEMS:   Constitutional: Negative for fever and chills.  HENT: Negative for congestion and rhinorrhea.  Eyes: Negative for redness and visual disturbance.  Respiratory: Positive for shortness of breath, SOB, cough and  wheezing.  Cardiovascular: Negative for chest pain and palpitations.  Gastrointestinal: Negative  for nausea , vomiting and abdominal pain and  Loose stools Genitourinary: Negative for dysuria and urgency.  Musculoskeletal: Negative for myalgias and arthralgias.  Skin: Negative for pallor and wound.  Neurological: Negative for dizziness and headaches   VITAL SIGNS: BP 139/82 mmHg  Pulse 103  Temp(Src) 97.1 F (36.2 C) (Axillary)  Resp 21  Ht 5\' 6"  (1.676 m)  Wt 280 lb (127.007 kg)  BMI 45.21 kg/m2  SpO2 92%  HEMODYNAMICS:    VENTILATOR SETTINGS: Vent Mode:  [-]  FiO2 (%):  [6 %-60 %] 6 %  INTAKE / OUTPUT: I/O last 3 completed shifts: In: 360 [P.O.:360] Out: 700 [Urine:700]  PHYSICAL EXAMINATION: General: Moderate respiratory distress Neuro: AAO X4, no focal deficits HEENT: PERRLA, BiPAP mask, neck is short Cardiovascular:  Tachycardic, S1/S2, no MRG Lungs: Increased WOB, bilateral airflow, breath sounds diminished in the bases, expiratory wheezes in all lung fields Abdomen: Obese, normal bowel sounds Musculoskeletal: +  rom, no deformities Extremities: +2 pulses, no edema Skin: Afebrile to touch  LABS:  BMET  Recent Labs Lab 08/24/15 0135 08/25/15 0311  NA 138 138  K 3.7 4.2  CL 104 102  CO2 26 26  BUN 13 21*  CREATININE 0.95 0.87  GLUCOSE 238* 327*     Electrolytes  Recent Labs Lab 08/24/15 0135 08/25/15 0311  CALCIUM 9.6 9.6  MG  --  1.9  PHOS  --  4.1    CBC  Recent Labs Lab 08/24/15 0135 08/25/15 0311  WBC 9.7 16.2*  HGB 14.0 13.7  HCT 41.3 40.5  PLT 198 203    Coag's No results for input(s): APTT, INR in the last 168 hours.  Sepsis Markers  Recent Labs Lab 08/24/15 0437  PROCALCITON <0.10    ABG  Recent Labs Lab 08/24/15 1345  PHART 7.36  PCO2ART 39  PO2ART 52*    Liver Enzymes No results for input(s): AST, ALT, ALKPHOS, BILITOT, ALBUMIN in the last 168 hours.  Cardiac Enzymes  Recent Labs Lab 08/24/15 0135 08/24/15 0437  TROPONINI <0.03 <0.03    Glucose  Recent Labs Lab 08/24/15 1125 08/24/15 1529 08/24/15 1640 08/24/15 2202 08/25/15 0659 08/25/15 1111  GLUCAP 386* 277* 246* 321* 300* 501*    Imaging Dg Chest 2 View  08/24/2015  CLINICAL DATA:  Shortness of breath and wheezing EXAM: CHEST  2 VIEW COMPARISON:  08/24/2015 FINDINGS: The heart size appears normal. There is no pleural effusion or edema. No airspace consolidation identified. Spondylosis is present within the thoracic spine. IMPRESSION: 1. No acute cardiopulmonary abnormalities. Electronically Signed   By: Kerby Moors M.D.   On: 08/24/2015 13:47    STUDIES:  None  CULTURES: MRSA screen negative  ANTIBIOTICS: Azithromycin 07/04>  SIGNIFICANT EVENTS: 07/04>ED with worsening dyspnea>admitted with acute COPD exacerbation  LINES/TUBES: PIVs  DISCUSSION: 55 yo female presenting with acute hypoxemic respiratory failure, and acute COPD exacerbation secondary to non-adherence; there might be a component of OSA/OHS as well.   ASSESSMENT / PLAN:  PULMONARY A: Acute hypoxemic respiratory failure Acute on chronic COPD/astham exacerbation P:   -intermittent BiPAP, QHS inpatient only.  - titrate to keep SPO2 >=88% -Nebulized steroids and bronchodilators -Levaquin 750 mg daily x 7 days -CXR and ABG  prn -wean to PO steriods -social services consult for med assistance -financial reason are the major source of noncompliance with this patient.  -Ground Glass opacities - nonspecific, recommend CT Chest in 6 months, which can be done by Dr. Humphrey Rolls.  -evaluation for sleep study (if not already done), by Dr. Devona Konig   CARDIOVASCULAR A:  H/O hypertension H/O pulmonary hypertension P:  -Hemodynamics per ICU -Continue home BP meds and prn hydralazine to keep SBP<140  ENDOCRINE A:   T2DM  P:   - restart oral hypoglycemics -Blood glucose testing with SSI coverage every 4 hrs   Critical Care Time devoted to patient care services described in this note is  35 Minutes.   This time reflects time of care of this signee Dr Vilinda Boehringer.  This critical care time does not reflect procedure time, or teaching time or supervisory time of PA/NP/Med-student/Med Resident etc but could involve care discussion time.  Vilinda Boehringer, MD [facility] Pager [phone] (please enter 7-digits) On Call Pager [phone] (please enter 7-digits)  Note: This note was prepared with Dragon dictation along with smaller phrase technology. Any transcriptional errors that result from this process  are unintentional.

## 2015-08-26 DIAGNOSIS — J9622 Acute and chronic respiratory failure with hypercapnia: Secondary | ICD-10-CM

## 2015-08-26 DIAGNOSIS — J9621 Acute and chronic respiratory failure with hypoxia: Secondary | ICD-10-CM

## 2015-08-26 LAB — CBC
HCT: 41 % (ref 35.0–47.0)
Hemoglobin: 13.8 g/dL (ref 12.0–16.0)
MCH: 29.6 pg (ref 26.0–34.0)
MCHC: 33.7 g/dL (ref 32.0–36.0)
MCV: 87.8 fL (ref 80.0–100.0)
Platelets: 207 10*3/uL (ref 150–440)
RBC: 4.67 MIL/uL (ref 3.80–5.20)
RDW: 14.9 % — ABNORMAL HIGH (ref 11.5–14.5)
WBC: 11 10*3/uL (ref 3.6–11.0)

## 2015-08-26 LAB — BASIC METABOLIC PANEL
Anion gap: 7 (ref 5–15)
BUN: 31 mg/dL — ABNORMAL HIGH (ref 6–20)
CO2: 31 mmol/L (ref 22–32)
Calcium: 9.1 mg/dL (ref 8.9–10.3)
Chloride: 101 mmol/L (ref 101–111)
Creatinine, Ser: 0.97 mg/dL (ref 0.44–1.00)
GFR calc Af Amer: >60 mL/min (ref >60)
GFR calc non Af Amer: >60 mL/min (ref >60)
Glucose, Bld: 214 mg/dL — ABNORMAL HIGH (ref 65–99)
Potassium: 3.8 mmol/L (ref 3.5–5.1)
Sodium: 139 mmol/L (ref 135–145)

## 2015-08-26 LAB — GLUCOSE, CAPILLARY
Glucose-Capillary: 253 mg/dL — ABNORMAL HIGH (ref 65–99)
Glucose-Capillary: 355 mg/dL — ABNORMAL HIGH (ref 65–99)
Glucose-Capillary: 317 mg/dL — ABNORMAL HIGH (ref 65–99)
Glucose-Capillary: 122 mg/dL — ABNORMAL HIGH (ref 65–99)

## 2015-08-26 MED ORDER — BUDESONIDE 0.25 MG/2ML IN SUSP
0.2500 mg | Freq: Three times a day (TID) | RESPIRATORY_TRACT | Status: AC
  Administered 2015-08-26 – 2015-08-29 (×9): 0.25 mg via RESPIRATORY_TRACT
  Filled 2015-08-26 (×9): qty 2

## 2015-08-26 MED ORDER — INSULIN ASPART 100 UNIT/ML ~~LOC~~ SOLN
6.0000 [IU] | Freq: Three times a day (TID) | SUBCUTANEOUS | Status: DC
  Administered 2015-08-26 – 2015-08-27 (×3): 6 [IU] via SUBCUTANEOUS
  Filled 2015-08-26 (×3): qty 6

## 2015-08-26 MED ORDER — INSULIN GLARGINE 100 UNIT/ML ~~LOC~~ SOLN
15.0000 [IU] | Freq: Every day | SUBCUTANEOUS | Status: DC
  Administered 2015-08-26: 15 [IU] via SUBCUTANEOUS
  Filled 2015-08-26 (×2): qty 0.15

## 2015-08-26 MED ORDER — IPRATROPIUM-ALBUTEROL 0.5-2.5 (3) MG/3ML IN SOLN
3.0000 mL | RESPIRATORY_TRACT | Status: DC
  Administered 2015-08-26 – 2015-08-29 (×18): 3 mL via RESPIRATORY_TRACT
  Filled 2015-08-26 (×18): qty 3

## 2015-08-26 NOTE — Progress Notes (Signed)
[facility] NAME: Ziza Salzmann    MR#:  [id number]  DATE OF BIRTH:  04/17/1960  SUBJECTIVE:  CHIEF COMPLAINT:   Chief Complaint  Patient presents with  . Respiratory Distress   On bipap overnight. Sats 94% on 4 L O2. Afebrile. Still has SOB. Wheezing. Cough. Feels worse with her breathing today although on less oxygen.  REVIEW OF SYSTEMS:    Review of Systems  Constitutional: Positive for malaise/fatigue. Negative for fever and chills.  HENT: Negative for sore throat.   Eyes: Negative for blurred vision, double vision and pain.  Respiratory: Positive for cough, shortness of breath and wheezing. Negative for hemoptysis.   Cardiovascular: Negative for chest pain, palpitations, orthopnea and leg swelling.  Gastrointestinal: Negative for nausea, vomiting, abdominal pain, diarrhea and constipation.  Musculoskeletal: Negative for back pain.  Skin: Negative for rash.  Neurological: Positive for weakness. Negative for headaches.  Psychiatric/Behavioral: The patient is nervous/anxious.     DRUG ALLERGIES:  No Known Allergies  VITALS:  Blood pressure 137/89, pulse 99, temperature 98.3 F (36.8 C), temperature source Oral, resp. rate 19, height 5\' 6"  (1.676 m), weight 127.007 kg (280 lb), SpO2 97 %.  PHYSICAL EXAMINATION:   Physical Exam  GENERAL:  55 y.o.-year-old patient  in the bed with conversational dyspnea. Obese EYES: Pupils equal, round, reactive to light and accommodation. No scleral icterus. Extraocular muscles intact.  HEENT: Head atraumatic, normocephalic. Oropharynx and nasopharynx clear.  NECK:  Supple, no jugular venous distention. No thyroid enlargement, no tenderness.  LUNGS: Decreased breath sounds. Bilateral wheezing CARDIOVASCULAR: S1, S2 normal. No murmurs, rubs, or gallops.  ABDOMEN: Soft, nontender, nondistended. Bowel sounds present. No organomegaly or mass.  EXTREMITIES: No cyanosis,  clubbing or edema b/l.    NEUROLOGIC: Cranial nerves II through XII are intact. No focal Motor or sensory deficits b/l.   PSYCHIATRIC: The patient is alert and oriented x 3. Anxious SKIN: No obvious rash, lesion, or ulcer.   LABORATORY PANEL:   CBC  Recent Labs Lab 08/26/15 0748  WBC 11.0  HGB 13.8  HCT 41.0  PLT 207   ------------------------------------------------------------------------------------------------------------------ Chemistries   Recent Labs Lab 08/25/15 0311 08/26/15 0748  NA 138 139  K 4.2 3.8  CL 102 101  CO2 26 31  GLUCOSE 327* 214*  BUN 21* 31*  CREATININE 0.87 0.97  CALCIUM 9.6 9.1  MG 1.9  --    ------------------------------------------------------------------------------------------------------------------  Cardiac Enzymes  Recent Labs Lab 08/24/15 0437  TROPONINI <0.03   ------------------------------------------------------------------------------------------------------------------  RADIOLOGY:  Dg Chest 2 View  08/24/2015  CLINICAL DATA:  Shortness of breath and wheezing EXAM: CHEST  2 VIEW COMPARISON:  08/24/2015 FINDINGS: The heart size appears normal. There is no pleural effusion or edema. No airspace consolidation identified. Spondylosis is present within the thoracic spine. IMPRESSION: 1. No acute cardiopulmonary abnormalities. Electronically Signed   By: Kerby Moors M.D.   On: 08/24/2015 13:47     ASSESSMENT AND PLAN:   * Acute hypoxic resp failure due to COPD exacerbation Left lower lobe pneumonia - Some improvement -  Antibiotics. Changed to PO prednisone - Scheduled Nebulizers. - Inhalers. - Wean O2 as tolerated. - Appreciate pulmonary help.  * DM, Uncontrolled due to steroids. SSI Added lantus at 15 units. Add pre meal insulin. HbA1c - 10.1 Patient has been on prednisone repeatedly over last few weeks which is likely cause of high Hb A1c. Continue metformin  * Hypertension On meds  *  DVT  prophylaxis   All the records are reviewed and case discussed with Care Management/Social Workerr. Management plans discussed with the patient, family and they are in agreement.  CODE STATUS: FULL CODE  DVT Prophylaxis: SCDs  TOTAL TIME TAKING CARE OF THIS PATIENT: 35 minutes.    Hillary Bow R M.D on 08/26/2015 at 10:01 AM  Between 7am to 6pm - Pager - [phone]  After 6pm go to [email or web address] - password [email or web address] [facility]  Office  [phone]  CC: Primary care physician; Perrin Maltese, MD  Note: This dictation was prepared with Dragon dictation along with smaller phrase technology. Any transcriptional errors that result from this process are unintentional.

## 2015-08-26 NOTE — Progress Notes (Signed)
PULMONARY / CRITICAL CARE MEDICINE   Name: Nancy Patton MRN: [id number] DOB: 1960/03/10    ADMISSION DATE:  08/24/2015   CONSULTATION DATE:  08/24/2015  REFERRING MD:  Dr. Hillary Bow  CHIEF COMPLAINT:  Worsening dyspnea  Brief HPI: 55 yo female with a PMH of asthma, hypertension, obesity, T2DM and pulmonary hypertension who presents with worsening dyspnea. She states she is usually dyspneic with exertion at baseline but symptoms got worse over the last three days and she became severely dyspneic at rest hence she decided to come to the ED. Dyspnea was associated with a mildly productive cough.At the ED, she was hypoxic with SPO2 in the 80s, admitted to the medical floor, with progressive decompensation requiring   SUBJECTIVE: Tolerated bipap overnight, now on 4L , with acceptable saturations, more wheezing today compared to yesterday.   PAST MEDICAL HISTORY :  She  has a past medical history of Asthma; Hypertension; Diabetes mellitus without complication ([place]); Obesity; and Pulmonary hypertension ([place]).  PAST SURGICAL HISTORY: She  has past surgical history that includes Cesarean section and Cataract extraction.  No Known Allergies  No current facility-administered medications on file prior to encounter.   Current Outpatient Prescriptions on File Prior to Encounter  Medication Sig  . albuterol (PROVENTIL HFA;VENTOLIN HFA) 108 (90 BASE) MCG/ACT inhaler Inhale 4-6 puffs by mouth every 4 hours as needed for wheezing, cough, and/or shortness of breath  . BREO ELLIPTA 200-25 MCG/INH AEPB Inhale 1 puff into the lungs daily.  . citalopram (CELEXA) 20 MG tablet Take 20 mg by mouth daily.  Marland Kitchen levalbuterol (XOPENEX) 1.25 MG/3ML nebulizer solution Take 3 mLs by nebulization every 8 (eight) hours as needed for wheezing or shortness of breath.   . losartan-hydrochlorothiazide (HYZAAR) 50-12.5 MG per tablet Take 1 tablet by mouth daily.  . metFORMIN (GLUCOPHAGE) 1000 MG tablet Take 1,000 mg  by mouth 2 (two) times daily.  Marland Kitchen tiotropium (SPIRIVA) 18 MCG inhalation capsule Place 18 mcg into inhaler and inhale daily.    FAMILY HISTORY:  Her has no family status information on file.   SOCIAL HISTORY: She  reports that she has quit smoking. She does not have any smokeless tobacco history on file. She reports that she does not drink alcohol or use illicit drugs.  REVIEW OF SYSTEMS:   Constitutional: Negative for fever and chills.  HENT: Negative for congestion and rhinorrhea.  Eyes: Negative for redness and visual disturbance.  Respiratory: Positive for shortness of breath, SOB, cough and  wheezing.  Cardiovascular: Negative for chest pain and palpitations.  Gastrointestinal: Negative  for nausea , vomiting and abdominal pain and  Loose stools Genitourinary: Negative for dysuria and urgency.  Musculoskeletal: Negative for myalgias and arthralgias.  Skin: Negative for pallor and wound.  Neurological: Negative for dizziness and headaches   VITAL SIGNS: BP 137/89 mmHg  Pulse 99  Temp(Src) 98.3 F (36.8 C) (Oral)  Resp 19  Ht 5\' 6"  (1.676 m)  Wt 280 lb (127.007 kg)  BMI 45.21 kg/m2  SpO2 97%  HEMODYNAMICS:    VENTILATOR SETTINGS: Vent Mode:  [-]  FiO2 (%):  [5 %-28 %] 28 %  INTAKE / OUTPUT: I/O last 3 completed shifts: In: 600 [P.O.:600] Out: 700 [Urine:700]  PHYSICAL EXAMINATION: General: Moderate respiratory distress Neuro: AAO X4, no focal deficits HEENT: PERRLA, BiPAP mask, neck is short Cardiovascular:  Tachycardic, S1/S2, no MRG Lungs: Increased WOB, bilateral airflow, diffuse wheezing - this is an improvement, shows increased airflow vs tight breath sounds.  Abdomen:  Obese, normal bowel sounds Musculoskeletal: +rom, no deformities Extremities: +2 pulses, no edema Skin: Afebrile to touch  LABS:  BMET  Recent Labs Lab 08/24/15 0135 08/25/15 0311 08/26/15 0748  NA 138 138 139  K 3.7 4.2 3.8  CL 104 102 101  CO2 26 26 31   BUN 13 21* 31*   CREATININE 0.95 0.87 0.97  GLUCOSE 238* 327* 214*    Electrolytes  Recent Labs Lab 08/24/15 0135 08/25/15 0311 08/26/15 0748  CALCIUM 9.6 9.6 9.1  MG  --  1.9  --   PHOS  --  4.1  --     CBC  Recent Labs Lab 08/24/15 0135 08/25/15 0311 08/26/15 0748  WBC 9.7 16.2* 11.0  HGB 14.0 13.7 13.8  HCT 41.3 40.5 41.0  PLT 198 203 207    Coag's No results for input(s): APTT, INR in the last 168 hours.  Sepsis Markers  Recent Labs Lab 08/24/15 0437  PROCALCITON <0.10    ABG  Recent Labs Lab 08/24/15 1345  PHART 7.36  PCO2ART 39  PO2ART 52*    Liver Enzymes No results for input(s): AST, ALT, ALKPHOS, BILITOT, ALBUMIN in the last 168 hours.  Cardiac Enzymes  Recent Labs Lab 08/24/15 0135 08/24/15 0437  TROPONINI <0.03 <0.03    Glucose  Recent Labs Lab 08/25/15 1111 08/25/15 1611 08/25/15 2003 08/25/15 2208 08/26/15 0714 08/26/15 1141  GLUCAP 501* 327* 212* 245* 253* 355*    Imaging No results found.  STUDIES:  None  CULTURES: MRSA screen negative  ANTIBIOTICS: Azithromycin 07/04>  SIGNIFICANT EVENTS: 07/04>ED with worsening dyspnea>admitted with acute COPD exacerbation  LINES/TUBES: PIVs  DISCUSSION: 55 yo female presenting with acute hypoxemic respiratory failure, and acute COPD exacerbation secondary to non-adherence; there might be a component of OSA/OHS as well.   ASSESSMENT / PLAN:  PULMONARY A: Acute hypoxemic respiratory failure Acute on chronic COPD/astham exacerbation Wheezing P:   -intermittent BiPAP, QHS inpatient only.  - titrate to keep SPO2 >=88% -Nebulized steroids and bronchodilators -increase duonebs to q4hrs scheduled today, and increase Pulmicort to TID for next 2 days.  -Levaquin 750 mg daily x 5 days -CXR and ABG prn - wheezing today in this patient is a good sign, compared to yesterday with tight breath sounds and minimal wheezing.  -wean to PO steriods -social services consult for med  assistance -financial reason are the major source of noncompliance with this patient.  -Ground Glass opacities - nonspecific, recommend CT Chest in 6 months, which can be done by Dr. Humphrey Rolls.  -evaluation for sleep study (if not already done), by Dr. Devona Konig   CARDIOVASCULAR A:  H/O hypertension H/O pulmonary hypertension P:  -Hemodynamics per ICU -Continue home BP meds and prn hydralazine to keep SBP<140  ENDOCRINE A:   T2DM  P:   - restart oral hypoglycemics -Blood glucose testing with SSI coverage every 4 hrs   Critical Care Time devoted to patient care services described in this note is  35 Minutes.   This time reflects time of care of this signee Dr Vilinda Boehringer.  This critical care time does not reflect procedure time, or teaching time or supervisory time of PA/NP/Med-student/Med Resident etc but could involve care discussion time.  Vilinda Boehringer, MD [facility] Pager [phone] (please enter 7-digits) On Call Pager [phone] (please enter 7-digits)  Note: This note was prepared with Dragon dictation along with smaller phrase technology. Any transcriptional errors that result from this  process are unintentional.

## 2015-08-26 NOTE — Progress Notes (Signed)
Inpatient Diabetes Program Recommendations  AACE/ADA: New Consensus Statement on Inpatient Glycemic Control (2015)  Target Ranges:  Prepandial:   less than 140 mg/dL      Peak postprandial:   less than 180 mg/dL (1-2 hours)      Critically ill patients:  140 - 180 mg/dL   Lab Results  Component Value Date   GLUCAP 253* 08/26/2015   HGBA1C 10.1* 08/24/2015    Review of Glycemic Control   Results for ALDEN, RITZERT (MRN [id number]) as of 08/26/2015 08:34  Ref. Range 08/25/2015 11:11 08/25/2015 16:11 08/25/2015 20:03 08/25/2015 22:08 08/26/2015 07:14  Glucose-Capillary Latest Ref Range: 65-99 mg/dL 501 (HH) 327 (H) 212 (H) 245 (H) 253 (H)    Diabetes history: Type 2 Outpatient Diabetes medications: Glucophage 1000mg  bid Current orders for Inpatient glycemic control: Novolog moderate correction tid, Novolog 0-5 units qhs, Glucophage 1000mg  bid * steroids qam  Inpatient Diabetes Program Recommendations: A1C 10.1% on 08/24/15.  Ongoing high blood sugars. Lantus has now been d/c'd. Steroids continue orally for the next few days.   Please consider increasing Lantus to 25 units qhs (0.2units/kg)(fasting blood sugars elevated) and add Novolog 8 units tid (hold if she eats less than 50%) Reassess needs tomorrow as steroids are tapered.  Gentry Fitz, RN, BA, MHA, CDE Diabetes Coordinator Inpatient Diabetes Program  [phone] (Team Pager) [phone] ([place]) 08/26/2015 8:39 AM

## 2015-08-27 DIAGNOSIS — J449 Chronic obstructive pulmonary disease, unspecified: Secondary | ICD-10-CM

## 2015-08-27 DIAGNOSIS — J9602 Acute respiratory failure with hypercapnia: Secondary | ICD-10-CM

## 2015-08-27 DIAGNOSIS — G4733 Obstructive sleep apnea (adult) (pediatric): Secondary | ICD-10-CM

## 2015-08-27 LAB — GLUCOSE, CAPILLARY
Glucose-Capillary: 135 mg/dL — ABNORMAL HIGH (ref 65–99)
Glucose-Capillary: 270 mg/dL — ABNORMAL HIGH (ref 65–99)
Glucose-Capillary: 226 mg/dL — ABNORMAL HIGH (ref 65–99)
Glucose-Capillary: 151 mg/dL — ABNORMAL HIGH (ref 65–99)

## 2015-08-27 MED ORDER — INSULIN GLARGINE 100 UNIT/ML ~~LOC~~ SOLN
10.0000 [IU] | Freq: Every day | SUBCUTANEOUS | Status: DC
  Administered 2015-08-27 – 2015-08-28 (×2): 10 [IU] via SUBCUTANEOUS
  Filled 2015-08-27 (×3): qty 0.1

## 2015-08-27 MED ORDER — INSULIN ASPART 100 UNIT/ML ~~LOC~~ SOLN
5.0000 [IU] | Freq: Three times a day (TID) | SUBCUTANEOUS | Status: DC
  Administered 2015-08-27 – 2015-08-29 (×6): 5 [IU] via SUBCUTANEOUS
  Filled 2015-08-27 (×5): qty 5

## 2015-08-27 NOTE — Care Management (Signed)
Clarified with patient that she does indeed have a home cpap machine which she uses at night. Patient will be assessed for the need of home 02.

## 2015-08-27 NOTE — Progress Notes (Signed)
PULMONARY / CRITICAL CARE MEDICINE   Name: Nancy Patton MRN: [id number] DOB: [birth year]    ADMISSION DATE:  08/24/2015   CONSULTATION DATE:  08/24/2015  REFERRING MD:  Dr. Hillary Bow  CHIEF COMPLAINT:  Worsening dyspnea  Brief HPI: 55 yo female with a PMH of asthma, hypertension, obesity, T2DM and pulmonary hypertension who presents with worsening dyspnea. She states she is usually dyspneic with exertion at baseline but symptoms got worse over the last three days and she became severely dyspneic at rest hence she decided to come to the ED. Dyspnea was associated with a mildly productive cough.At the ED, she was hypoxic with SPO2 in the 80s, admitted to the medical floor, with progressive decompensation requiring   SUBJECTIVE: Placed on home cpap overnight, tolerated well.  Breathing much improved today, still with wheezing, but improved.    PAST MEDICAL HISTORY :  She  has a past medical history of Asthma; Hypertension; Diabetes mellitus without complication ([place]); Obesity; and Pulmonary hypertension ([place]).  PAST SURGICAL HISTORY: She  has past surgical history that includes Cesarean section and Cataract extraction.  No Known Allergies  No current facility-administered medications on file prior to encounter.   Current Outpatient Prescriptions on File Prior to Encounter  Medication Sig  . albuterol (PROVENTIL HFA;VENTOLIN HFA) 108 (90 BASE) MCG/ACT inhaler Inhale 4-6 puffs by mouth every 4 hours as needed for wheezing, cough, and/or shortness of breath  . BREO ELLIPTA 200-25 MCG/INH AEPB Inhale 1 puff into the lungs daily.  . citalopram (CELEXA) 20 MG tablet Take 20 mg by mouth daily.  Marland Kitchen levalbuterol (XOPENEX) 1.25 MG/3ML nebulizer solution Take 3 mLs by nebulization every 8 (eight) hours as needed for wheezing or shortness of breath.   . losartan-hydrochlorothiazide (HYZAAR) 50-12.5 MG per tablet Take 1 tablet by mouth daily.  . metFORMIN (GLUCOPHAGE) 1000 MG tablet Take 1,000  mg by mouth 2 (two) times daily.  Marland Kitchen tiotropium (SPIRIVA) 18 MCG inhalation capsule Place 18 mcg into inhaler and inhale daily.    FAMILY HISTORY:  Her has no family status information on file.   SOCIAL HISTORY: She  reports that she has quit smoking. She does not have any smokeless tobacco history on file. She reports that she does not drink alcohol or use illicit drugs.  REVIEW OF SYSTEMS:   Constitutional: Negative for fever and chills.  HENT: Negative for congestion and rhinorrhea.  Eyes: Negative for redness and visual disturbance.  Respiratory: Positive for shortness of breath, SOB, cough and  wheezing.  Cardiovascular: Negative for chest pain and palpitations.  Gastrointestinal: Negative  for nausea , vomiting and abdominal pain and  Loose stools Genitourinary: Negative for dysuria and urgency.  Musculoskeletal: Negative for myalgias and arthralgias.  Skin: Negative for pallor and wound.  Neurological: Negative for dizziness and headaches   VITAL SIGNS: BP 121/76 mmHg  Pulse 93  Temp(Src) 98.1 F (36.7 C) (Oral)  Resp 22  Ht 5\' 6"  (1.676 m)  Wt 286 lb 13.1 oz (130.1 kg)  BMI 46.32 kg/m2  SpO2 97%  HEMODYNAMICS:    VENTILATOR SETTINGS:    INTAKE / OUTPUT: I/O last 3 completed shifts: In: 243 [P.O.:240; I.V.:3] Out: -   PHYSICAL EXAMINATION: General: Moderate respiratory distress Neuro: AAO X4, no focal deficits HEENT: PERRLA, BiPAP mask, neck is short Cardiovascular:  Tachycardic, S1/S2, no MRG Lungs:good airflow today, wheezing improved, especially in the bases Abdomen: Obese, normal bowel sounds Musculoskeletal: +rom, no deformities Extremities: +2 pulses, no edema Skin: Afebrile to touch  LABS:  BMET  Recent Labs Lab 08/24/15 0135 08/25/15 0311 08/26/15 0748  NA 138 138 139  K 3.7 4.2 3.8  CL 104 102 101  CO2 26 26 31   BUN 13 21* 31*  CREATININE 0.95 0.87 0.97  GLUCOSE 238* 327* 214*    Electrolytes  Recent Labs Lab 08/24/15 0135  08/25/15 0311 08/26/15 0748  CALCIUM 9.6 9.6 9.1  MG  --  1.9  --   PHOS  --  4.1  --     CBC  Recent Labs Lab 08/24/15 0135 08/25/15 0311 08/26/15 0748  WBC 9.7 16.2* 11.0  HGB 14.0 13.7 13.8  HCT 41.3 40.5 41.0  PLT 198 203 207    Coag's No results for input(s): APTT, INR in the last 168 hours.  Sepsis Markers  Recent Labs Lab 08/24/15 0437  PROCALCITON <0.10    ABG  Recent Labs Lab 08/24/15 1345  PHART 7.36  PCO2ART 39  PO2ART 52*    Liver Enzymes No results for input(s): AST, ALT, ALKPHOS, BILITOT, ALBUMIN in the last 168 hours.  Cardiac Enzymes  Recent Labs Lab 08/24/15 0135 08/24/15 0437  TROPONINI <0.03 <0.03    Glucose  Recent Labs Lab 08/25/15 2208 08/26/15 0714 08/26/15 1141 08/26/15 1612 08/26/15 2151 08/27/15 0710  GLUCAP 245* 253* 355* 317* 122* 135*    Imaging No results found.  STUDIES:  None  CULTURES: MRSA screen negative  ANTIBIOTICS: Azithromycin 07/04>  SIGNIFICANT EVENTS: 07/04>ED with worsening dyspnea>admitted with acute COPD exacerbation  LINES/TUBES: PIVs  DISCUSSION: 55 yo female presenting with acute hypoxemic respiratory failure, and acute COPD exacerbation secondary to non-adherence; there might be a component of OSA/OHS as well.   ASSESSMENT / PLAN:  PULMONARY A: Acute hypoxemic respiratory failure Acute on chronic COPD/astham exacerbation Wheezing P:   -home CPAP QHS - titrate to keep SPO2 >=88% -Nebulized steroids and bronchodilators.  -Levaquin 750 mg daily x 5 days total -CXR and ABG prn - wheezing is still present, but has improved.   -wean to PO steriods -social services consult for med assistance -financial reason are the major source of noncompliance with this patient.  -Ground Glass opacities - nonspecific, recommend CT Chest in 6 months, which can be done by Dr. Humphrey Rolls.  - walk test prior to discharge to determine O2 needs.  - Patient stable for transfer out of the ICU.   Dr. Humphrey Rolls, her primary pulmonologist notified.   CARDIOVASCULAR A:  H/O hypertension H/O pulmonary hypertension P:  -Hemodynamics per ICU -Continue home BP meds and prn hydralazine to keep SBP<140  ENDOCRINE A:   T2DM  P:   - oral hypoglycemics -Blood glucose testing with SSI coverage every 4 hrs  Thank you for consulting [facility], we will signoff at this time.  Please feel free to contact us with any questions at [phone] (please enter 7-digits).  Pulmonary Care Time devoted to patient care services described in this note is  35 Minutes.   Vilinda Boehringer, MD [facility] Pager [phone] (please enter 7-digits) On Call Pager [phone] (please enter 7-digits)  Note: This note was prepared with Dragon dictation along with smaller phrase technology. Any transcriptional errors that result from this process are unintentional.

## 2015-08-27 NOTE — Progress Notes (Signed)
Nancy Patton NAME: Nancy Patton    MR#:  [id number]  DATE OF BIRTH:  03-26-1960  SUBJECTIVE:  CHIEF COMPLAINT:   Chief Complaint  Patient presents with  . Respiratory Distress   Some better. Still on 3 L O2. Wheezing with some sputum. Afebrile. CPAP at night  REVIEW OF SYSTEMS:    Review of Systems  Constitutional: Positive for malaise/fatigue. Negative for fever and chills.  HENT: Negative for sore throat.   Eyes: Negative for blurred vision, double vision and pain.  Respiratory: Positive for cough, shortness of breath and wheezing. Negative for hemoptysis.   Cardiovascular: Negative for chest pain, palpitations, orthopnea and leg swelling.  Gastrointestinal: Negative for nausea, vomiting, abdominal pain, diarrhea and constipation.  Musculoskeletal: Negative for back pain.  Skin: Negative for rash.  Neurological: Positive for weakness. Negative for headaches.  Psychiatric/Behavioral: The patient is nervous/anxious.     DRUG ALLERGIES:  No Known Allergies  VITALS:  Blood pressure 121/76, pulse 93, temperature 98.1 F (36.7 C), temperature source Oral, resp. rate 22, height 5\' 6"  (1.676 m), weight 130.1 kg (286 lb 13.1 oz), SpO2 97 %.  PHYSICAL EXAMINATION:   Physical Exam  GENERAL:  55 y.o.-year-old patient  in the bed with conversational dyspnea. Obese EYES: Pupils equal, round, reactive to light and accommodation. No scleral icterus. Extraocular muscles intact.  HEENT: Head atraumatic, normocephalic. Oropharynx and nasopharynx clear.  NECK:  Supple, no jugular venous distention. No thyroid enlargement, no tenderness.  LUNGS: Decreased breath sounds. Bilateral wheezing. CARDIOVASCULAR: S1, S2 normal. No murmurs, rubs, or gallops.  ABDOMEN: Soft, nontender, nondistended. Bowel sounds present. No organomegaly or mass.  EXTREMITIES: No cyanosis, clubbing or edema b/l.    NEUROLOGIC: Cranial nerves II through  XII are intact. No focal Motor or sensory deficits b/l.   PSYCHIATRIC: The patient is alert and oriented x 3.  SKIN: No obvious rash, lesion, or ulcer.   LABORATORY PANEL:   CBC  Recent Labs Lab 08/26/15 0748  WBC 11.0  HGB 13.8  HCT 41.0  PLT 207   ------------------------------------------------------------------------------------------------------------------ Chemistries   Recent Labs Lab 08/25/15 0311 08/26/15 0748  NA 138 139  K 4.2 3.8  CL 102 101  CO2 26 31  GLUCOSE 327* 214*  BUN 21* 31*  CREATININE 0.87 0.97  CALCIUM 9.6 9.1  MG 1.9  --    ------------------------------------------------------------------------------------------------------------------  Cardiac Enzymes  Recent Labs Lab 08/24/15 0437  TROPONINI <0.03   ------------------------------------------------------------------------------------------------------------------  RADIOLOGY:  No results found.   ASSESSMENT AND PLAN:   * Acute hypoxic resp failure due to COPD exacerbation Left lower lobe pneumonia - improving slowly -  Antibiotics. Changed to PO prednisone - Scheduled Nebulizers. - Inhalers. - Wean O2 as tolerated. - Appreciate pulmonary help.  * DM, Uncontrolled due to steroids. SSI Will decrease lantus to 10 units and pre-meal insulin to 5 units today. Prednisone being decreased slowoly HbA1c - 10.1 Patient has been on prednisone repeatedly over last few weeks which is likely cause of high Hb A1c. Continue metformin  * Hypertension On meds  * DVT prophylaxis   All the records are reviewed and case discussed with Care Management/Social Workerr. Management plans discussed with the patient, family and they are in agreement.  CODE STATUS: FULL CODE  DVT Prophylaxis: SCDs  TOTAL TIME TAKING CARE OF THIS PATIENT: 35 minutes.   Possible discharge on Monday   Hillary Bow R M.D on 08/27/2015 at 9:38 AM  Between  7am to 6pm - Pager - [phone]  After 6pm go to  [email or web address] - password [email or web address] [facility]  Office  [phone]  CC: Primary care physician; Perrin Maltese, MD  Note: This dictation was prepared with Dragon dictation along with smaller phrase technology. Any transcriptional errors that result from this process are unintentional.

## 2015-08-27 NOTE — Evaluation (Signed)
Physical Therapy Evaluation Patient Details Name: Nancy Patton MRN: [id number] DOB: 05/13/1960 Today's Date: 08/27/2015   History of Present Illness  55 yo F presented to ED on 7/4 for SOB. She was admitted for COPD exacerbation. PMH includes asthma, DM, HTN, obesity, and pulmonary hypertension.  Clinical Impression  Pt demonstrated generalized weakness and difficulty walking during evaluation. LE strength grossly 4/5. Pt independent with bed mobility and transfers. She ambulated up to 175 ft with supervision and no AD. Cues for pursed lip breathing. SpO2 maintained 94 and above during session on 3L. She is mainly limited by her endurance. OPPT is recommended after hospital discharge to address deficits of strength and endurance to improve functional mobility and QOL. Pt will benefit from skilled PT services to increase functional I and mobility for safe discharge.     Follow Up Recommendations Outpatient PT    Equipment Recommendations  None recommended by PT    Recommendations for Other Services       Precautions / Restrictions Precautions Precautions: None Restrictions Weight Bearing Restrictions: No      Mobility  Bed Mobility Overal bed mobility: Independent                Transfers Overall transfer level: Independent Equipment used: None             General transfer comment: steady with no LOB  Ambulation/Gait Ambulation/Gait assistance: Supervision Ambulation Distance (Feet): 175 Feet Assistive device: None Gait Pattern/deviations: Decreased stride length Gait velocity: reduced Gait velocity interpretation: Below normal speed for age/gender General Gait Details: Slow but steady gait. Frequent standing rest breaks for energy conservation  Stairs            Wheelchair Mobility    Modified Rankin (Stroke Patients Only)       Balance Overall balance assessment: No apparent balance deficits (not formally assessed)                                            Pertinent Vitals/Pain Pain Assessment: No/denies pain    Home Living Family/patient expects to be discharged to:: Private residence Living Arrangements: Alone   Type of Home: Apartment Home Access: Level entry     Home Layout: One level Home Equipment: None      Prior Function Level of Independence: Independent         Comments: Pt independent with ADLs, works in a Therapist, occupational, drives     Journalist, newspaper        Extremity/Trunk Assessment   Upper Extremity Assessment: Overall WFL for tasks assessed           Lower Extremity Assessment: Generalized weakness (grossly 4/5)         Communication   Communication: No difficulties  Cognition Arousal/Alertness: Awake/alert Behavior During Therapy: WFL for tasks assessed/performed Overall Cognitive Status: Within Functional Limits for tasks assessed                      General Comments General comments (skin integrity, edema, etc.): 3 L O2    Exercises Other Exercises Other Exercises: Pt ambulated 175 ft and 100 ft with no AD and supervision. Standing therapeutic rest break for energy conservation. SpO2 mainained 94% and above on 3L. Slow but steady gait. Cues for pursed lip breathing.      Assessment/Plan    PT Assessment Patient  needs continued PT services  PT Diagnosis Difficulty walking;Generalized weakness   PT Problem List Decreased strength;Decreased activity tolerance;Cardiopulmonary status limiting activity  PT Treatment Interventions Gait training;Therapeutic activities;Therapeutic exercise;Balance training;Neuromuscular re-education;Patient/family education   PT Goals (Current goals can be found in the Care Plan section) Acute Rehab PT Goals Patient Stated Goal: to stop having these flare ups PT Goal Formulation: With patient Time For Goal Achievement: 09/10/15 Potential to Achieve Goals: Good    Frequency Min 2X/week   Barriers to  discharge Decreased caregiver support lives alone    Co-evaluation               End of Session Equipment Utilized During Treatment: Gait belt;Oxygen Activity Tolerance: Patient tolerated treatment well;Patient limited by fatigue Patient left: in chair;with call bell/phone within reach Nurse Communication: Mobility status         Time: [id number] PT Time Calculation (min) (ACUTE ONLY): 23 min   Charges:   PT Evaluation $PT Eval Moderate Complexity: 1 Procedure PT Treatments $Gait Training: 8-22 mins   PT G Codes:        Neoma Laming, PT, DPT  08/27/2015, 12:24 PM [id number]

## 2015-08-27 NOTE — Progress Notes (Signed)
Pt has remained alert and oriented x 4 with no c/o pain. Resp even and unlabored. Expiratory wheezes upon auscultation. 3LNC sating >90%. NSR on cardiac monitor. Pt with orders to transfer to med-surg. Report given to Gerald Stabs, [place].

## 2015-08-28 LAB — GLUCOSE, CAPILLARY
Glucose-Capillary: 147 mg/dL — ABNORMAL HIGH (ref 65–99)
Glucose-Capillary: 204 mg/dL — ABNORMAL HIGH (ref 65–99)
Glucose-Capillary: 277 mg/dL — ABNORMAL HIGH (ref 65–99)
Glucose-Capillary: 125 mg/dL — ABNORMAL HIGH (ref 65–99)

## 2015-08-28 NOTE — Progress Notes (Signed)
Date: 08/28/2015,   MRN# [id number] Nancy Patton Nov 27, 1960 Code Status:     Code Status Orders        Start     Ordered   08/24/15 0751  Full code   Continuous     08/24/15 0751    Code Status History    Date Active Date Inactive Code Status Order ID Comments User Context   07/10/2015 10:37 PM 07/13/2015  4:53 PM Full Code [id number]  Lance Coon, MD Inpatient   09/26/2014 12:30 PM 09/30/2014  2:42 PM Full Code [id number]  Idelle Crouch, MD Inpatient      HPI: Feeling better, less sob, wheezing much less. Wearing her cpap. There is no calf pain. She had been having multiple flares recently. Chest xray she basal atelectasis    PMHX:   Past Medical History  Diagnosis Date  . Asthma   . Hypertension   . Diabetes mellitus without complication ([place])   . Obesity   . Pulmonary hypertension ([place])    Surgical Hx:  Past Surgical History  Procedure Laterality Date  . Cesarean section    . Cataract extraction     Family Hx:  Family History  Problem Relation Age of Onset  . Cancer Neg Hx   . Diabetes Neg Hx   . Heart failure Neg Hx   . Hypertension Neg Hx    Social Hx:   Social History  Substance Use Topics  . Smoking status: Former Research scientist (life sciences)  . Smokeless tobacco: None  . Alcohol Use: No     Comment: 2nd hand smoke   Medication:    Home Medication:  No current outpatient prescriptions on file.  Current Medication: @CURMEDTAB @   Allergies:  Review of patient's allergies indicates no known allergies.  Review of Systems: Gen:  Denies  fever, sweats, chills HEENT: Denies blurred vision, double vision, ear pain, eye pain, hearing loss, nose bleeds, sore throat Cvc:  No dizziness, chest pain or heaviness Resp:   Less wheezing Gi: Denies swallowing difficulty, stomach pain, nausea or vomiting, diarrhea, constipation, bowel incontinence Gu:  Denies bladder incontinence, burning urine Ext:   No Joint pain, stiffness or swelling Skin: No skin rash, easy bruising or bleeding  or hives Endoc:  No polyuria, polydipsia , polyphagia or weight change Psych: No depression, insomnia or hallucinations  Other:  All other systems negative  Physical Examination:   VS: BP 96/72 mmHg  Pulse 99  Temp(Src) 99.5 F (37.5 C) (Oral)  Resp 16  Ht 5\' 6"  (1.676 m)  Wt 284 lb 9.6 oz (129.094 kg)  BMI 45.96 kg/m2  SpO2 93%  General Appearance: No distress  Neuro: without focal findings, mental status, speech normal, alert and oriented, cranial nerves 2-12 intact, reflexes normal and symmetric, sensation grossly normal  HEENT: PERRLA, EOM intact, no ptosis, no other lesions noticed Pulmonary:.wheezing, No rales  :   Cardiovascular:  Normal S1,S2.  No m/r/g.    Abdomen:Benign, Soft, non-tender, No masses, hepatosplenomegaly LYMPH: No lymphadenopathy Endoc: No evident thyromegaly, no signs of acromegaly or Cushing features Skin:   warm, no rashes, no ecchymosis  Extremities: normal, no cyanosis, clubbing, no edema, warm with normal capillary refill. Other findings:   Labs results:   Recent Labs     08/26/15  0748  HGB  13.8  HCT  41.0  MCV  87.8  WBC  11.0  BUN  31*  CREATININE  0.97  GLUCOSE  214*  CALCIUM  9.1  ,  Rad results:  CLINICAL DATA: Acute onset of shortness of breath. Initial encounter.  EXAM: CHEST 1 VIEW  COMPARISON: Chest radiograph and CTA of the chest performed 08/08/2015  FINDINGS: The lungs are mildly hyperexpanded. Mild left basilar atelectasis is noted. There is no evidence of pleural effusion or pneumothorax.  The cardiomediastinal silhouette is within normal limits. No acute osseous abnormalities are seen.  IMPRESSION: Lungs mildly hyperexpanded. Mild left basilar atelectasis noted.   Electronically Signed  By: Garald Balding M.D.  On: 08/24/2015 02:30  Assessment and Plan: COPD/Asthma exacerbation. Much improved. Mild rhinitis Continue her present regimen Consider adding singulair 10 mg  qhs Claritin/flonase pred taper  Obesity/sleep apnea, on cpap Weight loss Continue cpap Follow up with her pulmonologist     I have personally obtained a history, examined the patient, evaluated laboratory and imaging results, formulated the assessment and plan and placed orders.  The Patient requires high complexity decision making for assessment and support, frequent evaluation and titration of therapies, application of advanced monitoring technologies and extensive interpretation of multiple databases.   Herbon Fleming,M.D. Pulmonary & Critical care Medicine [facility]

## 2015-08-28 NOTE — Progress Notes (Signed)
Patient Saturations on Room Air at Rest = 94%  Patient Saturations on Room Air while Ambulating = 92%   

## 2015-08-28 NOTE — Progress Notes (Signed)
[place] at [place] NAME: Nancy Patton    MR#:  [id number]  DATE OF BIRTH:  [birth year]  SUBJECTIVE:  CHIEF COMPLAINT:   Chief Complaint  Patient presents with  . Respiratory Distress   Improving slowly. Using CPAP at night On 2 L O2. Dry cough Afebrile  REVIEW OF SYSTEMS:    Review of Systems  Constitutional: Positive for malaise/fatigue. Negative for fever and chills.  HENT: Negative for sore throat.   Eyes: Negative for blurred vision, double vision and pain.  Respiratory: Positive for cough, shortness of breath and wheezing. Negative for hemoptysis.   Cardiovascular: Negative for chest pain, palpitations, orthopnea and leg swelling.  Gastrointestinal: Negative for nausea, vomiting, abdominal pain, diarrhea and constipation.  Musculoskeletal: Negative for back pain.  Skin: Negative for rash.  Neurological: Positive for weakness. Negative for headaches.  Psychiatric/Behavioral: The patient is nervous/anxious.     DRUG ALLERGIES:  No Known Allergies  VITALS:  Blood pressure 133/76, pulse 80, temperature 98.1 F (36.7 C), temperature source Oral, resp. rate 16, height 5\' 6"  (1.676 m), weight 129.094 kg (284 lb 9.6 oz), SpO2 96 %.  PHYSICAL EXAMINATION:   Physical Exam  GENERAL:  55 y.o.-year-old patient  in the bed with conversational dyspnea. Obese EYES: Pupils equal, round, reactive to light and accommodation. No scleral icterus. Extraocular muscles intact.  HEENT: Head atraumatic, normocephalic. Oropharynx and nasopharynx clear.  NECK:  Supple, no jugular venous distention. No thyroid enlargement, no tenderness.  LUNGS: Decreased breath sounds. Bilateral wheezing. CARDIOVASCULAR: S1, S2 normal. No murmurs, rubs, or gallops.  ABDOMEN: Soft, nontender, nondistended. Bowel sounds present. No organomegaly or mass.  EXTREMITIES: No cyanosis, clubbing or edema b/l.    NEUROLOGIC: Cranial nerves II through XII are  intact. No focal Motor or sensory deficits b/l.   PSYCHIATRIC: The patient is alert and oriented x 3.  SKIN: No obvious rash, lesion, or ulcer.   LABORATORY PANEL:   CBC  Recent Labs Lab 08/26/15 0748  WBC 11.0  HGB 13.8  HCT 41.0  PLT 207   ------------------------------------------------------------------------------------------------------------------ Chemistries   Recent Labs Lab 08/25/15 0311 08/26/15 0748  NA 138 139  K 4.2 3.8  CL 102 101  CO2 26 31  GLUCOSE 327* 214*  BUN 21* 31*  CREATININE 0.87 0.97  CALCIUM 9.6 9.1  MG 1.9  --    ------------------------------------------------------------------------------------------------------------------  Cardiac Enzymes  Recent Labs Lab 08/24/15 0437  TROPONINI <0.03   ------------------------------------------------------------------------------------------------------------------  RADIOLOGY:  No results found.   ASSESSMENT AND PLAN:   * Acute hypoxic resp failure due to COPD exacerbation Left lower lobe pneumonia - improving slowly -  Antibiotics. Changed to PO prednisone - Scheduled Nebulizers. - Inhalers. - Wean O2 as tolerated. - Appreciate pulmonary help.  Possible discharge tomorrow. May need home oxygen.  * DM, Uncontrolled due to steroids. SSI On Lantus and pre-meal insulin. HbA1c - 10.1 Patient has been on prednisone repeatedly over last few weeks which is likely cause of high Hb A1c. Continue metformin  * Hypertension On meds  * DVT prophylaxis   All the records are reviewed and case discussed with Care Management/Social Workerr. Management plans discussed with the patient, family and they are in agreement.  CODE STATUS: FULL CODE  DVT Prophylaxis: SCDs  TOTAL TIME TAKING CARE OF THIS PATIENT: 35 minutes.   Possible discharge on tomorrow  Hillary Bow R M.D on 08/28/2015 at 10:47 AM  Between 7am to 6pm - Pager - [phone]  After 6pm go to [email or web address] - password  [email or web address] [place] [facility]  Office  [phone]  CC: Primary care physician; Perrin Maltese, MD  Note: This dictation was prepared with Dragon dictation along with smaller phrase technology. Any transcriptional errors that result from this process are unintentional.

## 2015-08-29 LAB — GLUCOSE, CAPILLARY
Glucose-Capillary: 132 mg/dL — ABNORMAL HIGH (ref 65–99)
Glucose-Capillary: 190 mg/dL — ABNORMAL HIGH (ref 65–99)

## 2015-08-29 MED ORDER — TIOTROPIUM BROMIDE MONOHYDRATE 18 MCG IN CAPS: 18.0000 ug | ORAL_CAPSULE | Freq: Every day | RESPIRATORY_TRACT | Status: DC

## 2015-08-29 MED ORDER — PREDNISONE 10 MG PO TABS
10.0000 mg | ORAL_TABLET | Freq: Every day | ORAL | Status: DC
Start: 2015-08-29 — End: 2017-12-27

## 2015-08-29 MED ORDER — LEVALBUTEROL HCL 1.25 MG/3ML IN NEBU: 3.0000 mL | INHALATION_SOLUTION | RESPIRATORY_TRACT | Status: DC | PRN

## 2015-08-29 MED ORDER — GLIPIZIDE ER 2.5 MG PO TB24: 2.5000 mg | ORAL_TABLET | Freq: Every day | ORAL | Status: DC

## 2015-08-29 MED ORDER — BREO ELLIPTA 200-25 MCG/INH IN AEPB: 1.0000 | INHALATION_SPRAY | Freq: Every day | RESPIRATORY_TRACT | Status: DC

## 2015-08-29 MED ORDER — ALBUTEROL SULFATE (2.5 MG/3ML) 0.083% IN NEBU: 2.5000 mg | INHALATION_SOLUTION | Freq: Four times a day (QID) | RESPIRATORY_TRACT | Status: DC | PRN

## 2015-08-29 NOTE — Progress Notes (Addendum)
[place], [place].   08/29/2015  Patient: Nancy Patton   Date of Birth:  [birth year]  Date of admission:  08/24/2015  Date of Discharge  08/29/2015    To Whom it May Concern:   Altha Harm  May return to work on 08/30/2015  PHYSICAL ACTIVITY:  Full  If you have any questions or concerns, please don't hesitate to call.  Sincerely,   Hillary Bow R M.D Office : [phone]   .

## 2015-08-29 NOTE — Discharge Instructions (Signed)

## 2015-08-29 NOTE — Progress Notes (Signed)
Pt being discharged today, IV X2 removed. Discharge instructions given to pt, she verified understanding. 2 prescriptions given to pt. She is currently waiting on her ride. She will be rolled out in wheelchair by staff.

## 2015-09-02 NOTE — Discharge Summary (Signed)
[place] at [place] NAME: Nancy Patton    MR#:  [id number]  DATE OF BIRTH:  07-08-1960  DATE OF ADMISSION:  08/24/2015 ADMITTING PHYSICIAN: Harrie Foreman, MD  DATE OF DISCHARGE: 08/29/2015 10:26 AM  PRIMARY CARE PHYSICIAN: Perrin Maltese, MD   ADMISSION DIAGNOSIS:  Dyspnea [R06.00] Hypoxia [R09.02] Asthma exacerbation [J45.901]  DISCHARGE DIAGNOSIS:  Active Problems:   COPD exacerbation ([place])   SECONDARY DIAGNOSIS:   Past Medical History  Diagnosis Date  . Asthma   . Hypertension   . Diabetes mellitus without complication ([place])   . Obesity   . Pulmonary hypertension ([place])      ADMITTING HISTORY  Chief Complaint: Shortness of breath HPI: The patient with past medical history significant for asthma and diabetes presents to the emergency department complaining of shortness of breath. This is her second emergency department evaluation in 2 weeks for the same complaint. She was not admitted on her previous encounter. She reports feeling somewhat better after steroids the first time but has progressively become more dyspneic since then. She denies cough but admits to runny nose that has been concurrent with her dyspnea. She denies chest pain, nausea, vomiting or diaphoresis. She units to being exposed to secondhand smoke but she is not a smoker herself. Also, the patient has not had her maintenance inhaler for approximately one month due to cost. Despite multiple breathing treatments, Solu-Medrol and magnesium patient continued to have wheezing and shortness of breath as well as hypoxia which prompted the emergency department staff to call for admission.  HOSPITAL COURSE:   * Acute hypoxic resp failure due to COPD exacerbation Left lower lobe pneumonia - improved slowly - Antibiotics. Changed to PO prednisone - Scheduled Nebulizers. - Inhalers. - Off O2 by the day of discharge. Sats >92% on ambulation without O2. -  Appreciate pulmonary help.  Seen by pulmonary Dr. Stevenson Clinch and Dr. Raul Del  * DM, Uncontrolled due to steroids. SSI On Lantus and pre-meal insulin in hospital HbA1c - 10.1 Patient has been on prednisone repeatedly over last few weeks which is likely cause of high Hb A1c. Continue metformin at discharge. Added Glipizide  * Hypertension On meds  * DVT prophylaxis  Stable for discharge home with pulmonary f/u and OP PFTs  CONSULTS OBTAINED:  Treatment Team:  Allyne Gee, MD  DRUG ALLERGIES:  No Known Allergies  DISCHARGE MEDICATIONS:   Discharge Medication List as of 08/29/2015  9:21 AM    START taking these medications   Details  albuterol (PROVENTIL) (2.5 MG/3ML) 0.083% nebulizer solution Take 3 mLs (2.5 mg total) by nebulization every 6 (six) hours as needed for wheezing or shortness of breath., Starting 08/29/2015, Until Discontinued, Print    glipiZIDE (GLIPIZIDE XL) 2.5 MG 24 hr tablet Take 1 tablet (2.5 mg total) by mouth daily with breakfast., Starting 08/29/2015, Until Discontinued, Print    predniSONE (DELTASONE) 10 MG tablet Take 1 tablet (10 mg total) by mouth daily with breakfast., Starting 08/29/2015, Until Discontinued, Print      CONTINUE these medications which have CHANGED   Details  BREO ELLIPTA 200-25 MCG/INH AEPB Inhale 1 puff into the lungs daily., Starting 08/29/2015, Until Discontinued, Print    tiotropium (SPIRIVA) 18 MCG inhalation capsule Place 1 capsule (18 mcg total) into inhaler and inhale daily., Starting 08/29/2015, Until Discontinued, Print      CONTINUE these medications which have NOT CHANGED   Details  albuterol (PROVENTIL HFA;VENTOLIN HFA) 108 (90 BASE)  MCG/ACT inhaler Inhale 4-6 puffs by mouth every 4 hours as needed for wheezing, cough, and/or shortness of breath, Print    citalopram (CELEXA) 20 MG tablet Take 20 mg by mouth daily., Starting 09/15/2014, Until Discontinued, Historical Med    losartan-hydrochlorothiazide (HYZAAR) 50-12.5 MG per  tablet Take 1 tablet by mouth daily., Starting 09/15/2014, Until Discontinued, Historical Med    metFORMIN (GLUCOPHAGE) 1000 MG tablet Take 1,000 mg by mouth 2 (two) times daily., Starting 09/09/2014, Until Discontinued, Historical Med      STOP taking these medications     levalbuterol (XOPENEX) 1.25 MG/3ML nebulizer solution         Today   VITAL SIGNS:  Blood pressure 133/71, pulse 91, temperature 98.6 F (37 C), temperature source Oral, resp. rate 20, height 5\' 6"  (1.676 m), weight 130.455 kg (287 lb 9.6 oz), SpO2 92 %.  I/O:  No intake or output data in the 24 hours ending 09/02/15 0905  PHYSICAL EXAMINATION:  Physical Exam  GENERAL:  55 y.o.-year-old patient lying in the bed with no acute distress. Obese LUNGS: Normal breath sounds bilaterally, no wheezing, rales,rhonchi or crepitation. No use of accessory muscles of respiration.  CARDIOVASCULAR: S1, S2 normal. No murmurs, rubs, or gallops.  PSYCHIATRIC: The patient is alert and oriented x 3.   DATA REVIEW:   CBC No results for input(s): WBC, HGB, HCT, PLT in the last 168 hours.  Chemistries  No results for input(s): NA, K, CL, CO2, GLUCOSE, BUN, CREATININE, CALCIUM, MG, AST, ALT, ALKPHOS, BILITOT in the last 168 hours.  Invalid input(s): GFRCGP  Cardiac Enzymes No results for input(s): TROPONINI in the last 168 hours.  Microbiology Results  Results for orders placed or performed during the hospital encounter of 08/24/15  MRSA PCR Screening     Status: None   Collection Time: 08/24/15 10:51 AM  Result Value Ref Range Status   MRSA by PCR NEGATIVE NEGATIVE Final    Comment:        The GeneXpert MRSA Assay (FDA approved for NASAL specimens only), is one component of a comprehensive MRSA colonization surveillance program. It is not intended to diagnose MRSA infection nor to guide or monitor treatment for MRSA infections.     RADIOLOGY:  No results found.  Follow up with PCP in 1 week.  Management  plans discussed with the patient, family and they are in agreement.  CODE STATUS:  Code Status History    Date Active Date Inactive Code Status Order ID Comments User Context   08/24/2015  7:51 AM 08/29/2015  1:26 PM Full Code [id number]  Harrie Foreman, MD Inpatient   07/10/2015 10:37 PM 07/13/2015  4:53 PM Full Code [id number]  Lance Coon, MD Inpatient   09/26/2014 12:30 PM 09/30/2014  2:42 PM Full Code [id number]  Idelle Crouch, MD Inpatient      TOTAL TIME TAKING CARE OF THIS PATIENT ON DAY OF DISCHARGE: more than 30 minutes.   Hillary Bow R M.D on 09/02/2015 at 9:05 AM  Between 7am to 6pm - Pager - [phone]  After 6pm go to [email or web address] - password [email or web address] [place]  Office  [phone]  CC: Primary care physician; Perrin Maltese, MD  Note: This dictation was prepared with Dragon dictation along with smaller phrase technology. Any transcriptional errors that result from this process are unintentional.

## 2015-12-27 ENCOUNTER — Emergency Department: Payer: Managed Care, Other (non HMO)

## 2015-12-27 ENCOUNTER — Encounter: Payer: Self-pay | Admitting: *Deleted

## 2015-12-27 ENCOUNTER — Emergency Department
Admission: EM | Admit: 2015-12-27 | Discharge: 2015-12-27 | Disposition: A | Discharge status: Home / Self Care | Payer: Managed Care, Other (non HMO) | Attending: Emergency Medicine | Admitting: Emergency Medicine

## 2015-12-27 DIAGNOSIS — J449 Chronic obstructive pulmonary disease, unspecified: Secondary | ICD-10-CM | POA: Insufficient documentation

## 2015-12-27 DIAGNOSIS — E119 Type 2 diabetes mellitus without complications: Secondary | ICD-10-CM | POA: Insufficient documentation

## 2015-12-27 DIAGNOSIS — Z7984 Long term (current) use of oral hypoglycemic drugs: Secondary | ICD-10-CM | POA: Insufficient documentation

## 2015-12-27 DIAGNOSIS — I10 Essential (primary) hypertension: Secondary | ICD-10-CM | POA: Insufficient documentation

## 2015-12-27 DIAGNOSIS — R1031 Right lower quadrant pain: Secondary | ICD-10-CM | POA: Insufficient documentation

## 2015-12-27 DIAGNOSIS — Z87891 Personal history of nicotine dependence: Secondary | ICD-10-CM | POA: Insufficient documentation

## 2015-12-27 DIAGNOSIS — Z79899 Other long term (current) drug therapy: Secondary | ICD-10-CM | POA: Insufficient documentation

## 2015-12-27 DIAGNOSIS — J45909 Unspecified asthma, uncomplicated: Secondary | ICD-10-CM | POA: Insufficient documentation

## 2015-12-27 DIAGNOSIS — R109 Unspecified abdominal pain: Secondary | ICD-10-CM

## 2015-12-27 LAB — URINALYSIS COMPLETE WITH MICROSCOPIC (ARMC ONLY)
Bacteria, UA: NONE SEEN
Bilirubin Urine: NEGATIVE
Glucose, UA: NEGATIVE mg/dL
Hgb urine dipstick: NEGATIVE
Ketones, ur: NEGATIVE mg/dL
Nitrite: NEGATIVE
Protein, ur: NEGATIVE mg/dL
Specific Gravity, Urine: 1.016 (ref 1.005–1.030)
pH: 5 (ref 5.0–8.0)

## 2015-12-27 LAB — COMPREHENSIVE METABOLIC PANEL
ALT: 28 U/L (ref 14–54)
AST: 26 U/L (ref 15–41)
Albumin: 4 g/dL (ref 3.5–5.0)
Alkaline Phosphatase: 70 U/L (ref 38–126)
Anion gap: 7 (ref 5–15)
BUN: 16 mg/dL (ref 6–20)
CO2: 26 mmol/L (ref 22–32)
Calcium: 9 mg/dL (ref 8.9–10.3)
Chloride: 106 mmol/L (ref 101–111)
Creatinine, Ser: 0.78 mg/dL (ref 0.44–1.00)
GFR calc Af Amer: >60 mL/min (ref >60)
GFR calc non Af Amer: >60 mL/min (ref >60)
Glucose, Bld: 113 mg/dL — ABNORMAL HIGH (ref 65–99)
Potassium: 4.3 mmol/L (ref 3.5–5.1)
Sodium: 139 mmol/L (ref 135–145)
Total Bilirubin: 0.4 mg/dL (ref 0.3–1.2)
Total Protein: 7 g/dL (ref 6.5–8.1)

## 2015-12-27 LAB — CBC
HCT: 37.5 % (ref 35.0–47.0)
Hemoglobin: 12.8 g/dL (ref 12.0–16.0)
MCH: 28.9 pg (ref 26.0–34.0)
MCHC: 34.2 g/dL (ref 32.0–36.0)
MCV: 84.6 fL (ref 80.0–100.0)
Platelets: 199 10*3/uL (ref 150–440)
RBC: 4.43 MIL/uL (ref 3.80–5.20)
RDW: 13 % (ref 11.5–14.5)
WBC: 6.2 10*3/uL (ref 3.6–11.0)

## 2015-12-27 LAB — LIPASE, BLOOD: Lipase: 19 U/L (ref 11–51)

## 2015-12-27 MED ORDER — HYDROCODONE-ACETAMINOPHEN 5-325 MG PO TABS: 1.0000 | ORAL_TABLET | ORAL | 0 refills | Status: DC | PRN

## 2015-12-27 MED ORDER — KETOROLAC TROMETHAMINE 30 MG/ML IJ SOLN
30.0000 mg | Freq: Once | INTRAMUSCULAR | Status: AC
  Administered 2015-12-27: 30 mg via INTRAVENOUS

## 2015-12-27 MED ORDER — KETOROLAC TROMETHAMINE 30 MG/ML IJ SOLN
INTRAMUSCULAR | Status: AC
  Administered 2015-12-27: 30 mg via INTRAVENOUS
  Filled 2015-12-27: qty 1

## 2015-12-27 NOTE — ED Triage Notes (Signed)
Pt was at work with sudden onset on right lower quad pain, pt denies any other symptoms

## 2015-12-27 NOTE — ED Provider Notes (Signed)
[facility] Emergency Department Provider Note   ____________________________________________    I have reviewed the triage vital signs and the nursing notes.   HISTORY  Chief Complaint Abdominal Pain     HPI Nancy Patton is a 55 y.o. female presents with abrupt onset right flank pain. Patient reports she felt well this morning but then developed pain in her right flank/right lower quadrant. She reports the pain was severe and sharp in nature. She is never had this before. She not take anything for it. She denies dysuria. No fevers or chills. No back pain. No nausea or vomiting. Normal stools.   Past Medical History:  Diagnosis Date  . Asthma   . Diabetes mellitus without complication ([place])   . Hypertension   . Obesity   . Pulmonary hypertension     Patient Active Problem List   Diagnosis Date Noted  . COPD exacerbation ([place]) 08/24/2015  . HTN (hypertension) 07/10/2015  . Type 2 diabetes mellitus ([place]) 07/10/2015  . Depression 07/10/2015  . Acute respiratory distress 09/26/2014  . Asthmatic bronchitis with acute exacerbation 09/26/2014  . Asthmatic bronchitis with exacerbation 09/26/2014    Past Surgical History:  Procedure Laterality Date  . CATARACT EXTRACTION    . CESAREAN SECTION      Prior to Admission medications   Medication Sig Start Date End Date Taking? Authorizing Provider  albuterol (PROVENTIL HFA;VENTOLIN HFA) 108 (90 BASE) MCG/ACT inhaler Inhale 4-6 puffs by mouth every 4 hours as needed for wheezing, cough, and/or shortness of breath 07/08/14   Hinda Kehr, MD  albuterol (PROVENTIL) (2.5 MG/3ML) 0.083% nebulizer solution Take 3 mLs (2.5 mg total) by nebulization every 6 (six) hours as needed for wheezing or shortness of breath. 08/29/15   Srikar Sudini, MD  BREO ELLIPTA 200-25 MCG/INH AEPB Inhale 1 puff into the lungs daily. 08/29/15   Srikar Sudini, MD  citalopram (CELEXA) 20 MG tablet Take 20 mg by mouth daily. 09/15/14    Historical Provider, MD  glipiZIDE (GLIPIZIDE XL) 2.5 MG 24 hr tablet Take 1 tablet (2.5 mg total) by mouth daily with breakfast. 08/29/15   Hillary Bow, MD  HYDROcodone-acetaminophen (NORCO/VICODIN) 5-325 MG tablet Take 1 tablet by mouth every 4 (four) hours as needed for moderate pain. 12/27/15   Lavonia Drafts, MD  losartan-hydrochlorothiazide (HYZAAR) 50-12.5 MG per tablet Take 1 tablet by mouth daily. 09/15/14   Historical Provider, MD  metFORMIN (GLUCOPHAGE) 1000 MG tablet Take 1,000 mg by mouth 2 (two) times daily. 09/09/14   Historical Provider, MD  predniSONE (DELTASONE) 10 MG tablet Take 1 tablet (10 mg total) by mouth daily with breakfast. 08/29/15   Hillary Bow, MD  tiotropium (SPIRIVA) 18 MCG inhalation capsule Place 1 capsule (18 mcg total) into inhaler and inhale daily. 08/29/15   Hillary Bow, MD     Allergies Patient has no known allergies.  Family History  Problem Relation Age of Onset  . Cancer Neg Hx   . Diabetes Neg Hx   . Heart failure Neg Hx   . Hypertension Neg Hx     Social History Social History  Substance Use Topics  . Smoking status: Former Research scientist (life sciences)  . Smokeless tobacco: Not on file  . Alcohol use No     Comment: 2nd hand smoke    Review of Systems  Constitutional: No fever/chills  Gastrointestinal:As above  Genitourinary: Negative for dysuria. Musculoskeletal: Negative for back pain.   10-point ROS otherwise negative.  ____________________________________________   PHYSICAL EXAM:  VITAL SIGNS: ED Triage Vitals  Enc Vitals Group     BP 12/27/15 1051 131/82     Pulse Rate 12/27/15 1051 62     Resp 12/27/15 1051 20     Temp 12/27/15 1051 98.2 F (36.8 C)     Temp Source 12/27/15 1051 Oral     SpO2 12/27/15 1051 97 %     Weight 12/27/15 1052 300 lb (136.1 kg)     Height 12/27/15 1052 5\' 5"  (1.651 m)     Head Circumference --      Peak Flow --      Pain Score 12/27/15 1052 10     Pain Loc --      Pain Edu? --      Excl. in [place]? --      Constitutional: Alert and oriented. No acute distress. Pleasant and interactive Eyes: Conjunctivae are normal.   Nose: No congestion/rhinnorhea. Mouth/Throat: Mucous membranes are moist.    Cardiovascular: Normal rate, regular rhythm. Grossly normal heart sounds.  Good peripheral circulation. Respiratory: Normal respiratory effort.  No retractions. Lungs CTAB. Gastrointestinal: Soft and nontender. No distention.  No CVA tenderness. Genitourinary: deferred Musculoskeletal: No lower extremity tenderness nor edema.  Warm and well perfused Neurologic:  Normal speech and language. No gross focal neurologic deficits are appreciated.  Skin:  Skin is warm, dry and intact. No rash noted. Psychiatric: Mood and affect are normal. Speech and behavior are normal.  ____________________________________________   LABS (all labs ordered are listed, but only abnormal results are displayed)  Labs Reviewed  COMPREHENSIVE METABOLIC PANEL - Abnormal; Notable for the following:       Result Value   Glucose, Bld 113 (*)    All other components within normal limits  URINALYSIS COMPLETEWITH MICROSCOPIC ([facility] ONLY) - Abnormal; Notable for the following:    Color, Urine YELLOW (*)    APPearance CLEAR (*)    Leukocytes, UA 1+ (*)    Squamous Epithelial / LPF 0-5 (*)    All other components within normal limits  LIPASE, BLOOD  CBC   ____________________________________________  EKG  None ____________________________________________  RADIOLOGY  CT renal stone study is unremarkable ____________________________________________   PROCEDURES  Procedure(s) performed: No    Critical Care performed: No ____________________________________________   INITIAL IMPRESSION / ASSESSMENT AND PLAN / ED COURSE  Pertinent labs & imaging results that were available during my care of the patient were reviewed by me and considered in my medical decision making (see chart for details).  Patient  well-appearing in no distress. Her exam is unremarkable. Abrupt onset flank pain suspicious for renal stone study, we'll obtain CT and treat with Toradol.  Clinical Course as of Dec 27 1538  Mon Dec 27, 2015  1334 ALT: 28 [RK]  1440 CT RENAL STONE STUDY [RK]    Clinical Course User Index [RK] Lavonia Drafts, MD   ____________________________________________ Patient with significant improvement after Toradol, CT scan is unremarkable. Lab work is reassuring. Urinalysis is negative. Discussed with patient, plan is to discharge with pain medications and return if worsening pain or no improvement.  FINAL CLINICAL IMPRESSION(S) / ED DIAGNOSES  Final diagnoses:  Right flank pain  Right lower quadrant abdominal pain      NEW MEDICATIONS STARTED DURING THIS VISIT:  Discharge Medication List as of 12/27/2015  3:16 PM    START taking these medications   Details  HYDROcodone-acetaminophen (NORCO/VICODIN) 5-325 MG tablet Take 1 tablet by mouth every 4 (four) hours as needed for moderate  pain., Starting Mon 12/27/2015, Print         Note:  This document was prepared using Dragon voice recognition software and may include unintentional dictation errors.    Lavonia Drafts, MD 12/27/15 1540

## 2015-12-27 NOTE — ED Notes (Signed)
AAOx3.  Skin warm and dry. NAD.  Ambulates with easy and steady gait.   

## 2016-02-03 ENCOUNTER — Emergency Department
Admission: EM | Admit: 2016-02-03 | Discharge: 2016-02-03 | Disposition: A | Discharge status: Home / Self Care | Payer: Managed Care, Other (non HMO) | Attending: Emergency Medicine | Admitting: Emergency Medicine

## 2016-02-03 ENCOUNTER — Emergency Department: Payer: Managed Care, Other (non HMO)

## 2016-02-03 DIAGNOSIS — J45909 Unspecified asthma, uncomplicated: Secondary | ICD-10-CM | POA: Insufficient documentation

## 2016-02-03 DIAGNOSIS — E119 Type 2 diabetes mellitus without complications: Secondary | ICD-10-CM | POA: Diagnosis not present

## 2016-02-03 DIAGNOSIS — Z87891 Personal history of nicotine dependence: Secondary | ICD-10-CM | POA: Diagnosis not present

## 2016-02-03 DIAGNOSIS — G8911 Acute pain due to trauma: Secondary | ICD-10-CM

## 2016-02-03 DIAGNOSIS — S29012A Strain of muscle and tendon of back wall of thorax, initial encounter: Secondary | ICD-10-CM | POA: Insufficient documentation

## 2016-02-03 DIAGNOSIS — I10 Essential (primary) hypertension: Secondary | ICD-10-CM | POA: Insufficient documentation

## 2016-02-03 DIAGNOSIS — Y9389 Activity, other specified: Secondary | ICD-10-CM | POA: Insufficient documentation

## 2016-02-03 DIAGNOSIS — Y999 Unspecified external cause status: Secondary | ICD-10-CM | POA: Insufficient documentation

## 2016-02-03 DIAGNOSIS — Z79899 Other long term (current) drug therapy: Secondary | ICD-10-CM | POA: Insufficient documentation

## 2016-02-03 DIAGNOSIS — S29019A Strain of muscle and tendon of unspecified wall of thorax, initial encounter: Secondary | ICD-10-CM

## 2016-02-03 DIAGNOSIS — J449 Chronic obstructive pulmonary disease, unspecified: Secondary | ICD-10-CM | POA: Insufficient documentation

## 2016-02-03 DIAGNOSIS — Y92410 Unspecified street and highway as the place of occurrence of the external cause: Secondary | ICD-10-CM | POA: Diagnosis not present

## 2016-02-03 DIAGNOSIS — Z7984 Long term (current) use of oral hypoglycemic drugs: Secondary | ICD-10-CM | POA: Insufficient documentation

## 2016-02-03 DIAGNOSIS — S39012A Strain of muscle, fascia and tendon of lower back, initial encounter: Secondary | ICD-10-CM | POA: Diagnosis not present

## 2016-02-03 DIAGNOSIS — S3992XA Unspecified injury of lower back, initial encounter: Secondary | ICD-10-CM | POA: Diagnosis present

## 2016-02-03 MED ORDER — CYCLOBENZAPRINE HCL 10 MG PO TABS
10.0000 mg | ORAL_TABLET | Freq: Once | ORAL | Status: AC
  Administered 2016-02-03: 10 mg via ORAL
  Filled 2016-02-03: qty 1

## 2016-02-03 MED ORDER — CYCLOBENZAPRINE HCL 10 MG PO TABS: 10.0000 mg | ORAL_TABLET | Freq: Three times a day (TID) | ORAL | 0 refills | Status: DC | PRN

## 2016-02-03 MED ORDER — NAPROXEN 500 MG PO TABS: 500.0000 mg | ORAL_TABLET | Freq: Two times a day (BID) | ORAL | 0 refills | Status: DC

## 2016-02-03 MED ORDER — NAPROXEN 500 MG PO TABS
500.0000 mg | ORAL_TABLET | Freq: Once | ORAL | Status: AC
Start: 2016-02-03 — End: 2016-02-03
  Administered 2016-02-03: 500 mg via ORAL
  Filled 2016-02-03: qty 1
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
## 2016-02-03 NOTE — Discharge Instructions (Signed)
Up with your primary care provider for symptoms that are not improving over the week. Take medications as prescribed. Return to the emergency department for symptoms that change or worsen if she unable to schedule an appointment with primary care.
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
## 2016-09-01 ENCOUNTER — Other Ambulatory Visit: Payer: Self-pay | Admitting: Internal Medicine

## 2016-09-01 DIAGNOSIS — Z1231 Encounter for screening mammogram for malignant neoplasm of breast: Secondary | ICD-10-CM

## 2016-09-26 ENCOUNTER — Ambulatory Visit
Admission: RE | Admit: 2016-09-26 | Discharge: 2016-09-26 | Disposition: A | Discharge status: Home / Self Care | Payer: Managed Care, Other (non HMO) | Source: Ambulatory Visit | Attending: Internal Medicine | Admitting: Internal Medicine

## 2016-09-26 DIAGNOSIS — Z1231 Encounter for screening mammogram for malignant neoplasm of breast: Secondary | ICD-10-CM | POA: Insufficient documentation

## 2017-11-16 ENCOUNTER — Other Ambulatory Visit: Payer: Self-pay | Admitting: Internal Medicine

## 2017-11-16 DIAGNOSIS — Z1231 Encounter for screening mammogram for malignant neoplasm of breast: Secondary | ICD-10-CM

## 2017-12-04 ENCOUNTER — Ambulatory Visit
Admission: RE | Admit: 2017-12-04 | Discharge: 2017-12-04 | Disposition: A | Discharge status: Home / Self Care | Payer: 59 | Source: Ambulatory Visit | Attending: Internal Medicine | Admitting: Internal Medicine

## 2017-12-04 DIAGNOSIS — Z1231 Encounter for screening mammogram for malignant neoplasm of breast: Secondary | ICD-10-CM | POA: Insufficient documentation

## 2017-12-27 ENCOUNTER — Emergency Department
Admission: EM | Admit: 2017-12-27 | Discharge: 2017-12-27 | Disposition: A | Discharge status: Home / Self Care | Payer: No Typology Code available for payment source | Attending: Emergency Medicine | Admitting: Emergency Medicine

## 2017-12-27 ENCOUNTER — Other Ambulatory Visit: Payer: Self-pay

## 2017-12-27 ENCOUNTER — Encounter: Payer: Self-pay | Admitting: Emergency Medicine

## 2017-12-27 DIAGNOSIS — Y929 Unspecified place or not applicable: Secondary | ICD-10-CM | POA: Insufficient documentation

## 2017-12-27 DIAGNOSIS — Y9389 Activity, other specified: Secondary | ICD-10-CM | POA: Insufficient documentation

## 2017-12-27 DIAGNOSIS — S0591XA Unspecified injury of right eye and orbit, initial encounter: Secondary | ICD-10-CM | POA: Diagnosis not present

## 2017-12-27 DIAGNOSIS — E119 Type 2 diabetes mellitus without complications: Secondary | ICD-10-CM | POA: Diagnosis not present

## 2017-12-27 DIAGNOSIS — Y990 Civilian activity done for income or pay: Secondary | ICD-10-CM | POA: Diagnosis not present

## 2017-12-27 DIAGNOSIS — Z79899 Other long term (current) drug therapy: Secondary | ICD-10-CM | POA: Diagnosis not present

## 2017-12-27 DIAGNOSIS — Y33XXXA Other specified events, undetermined intent, initial encounter: Secondary | ICD-10-CM | POA: Diagnosis not present

## 2017-12-27 DIAGNOSIS — J45909 Unspecified asthma, uncomplicated: Secondary | ICD-10-CM | POA: Diagnosis not present

## 2017-12-27 DIAGNOSIS — I10 Essential (primary) hypertension: Secondary | ICD-10-CM | POA: Diagnosis not present

## 2017-12-27 DIAGNOSIS — Z87891 Personal history of nicotine dependence: Secondary | ICD-10-CM | POA: Insufficient documentation

## 2017-12-27 DIAGNOSIS — T2690XA Corrosion of unspecified eye and adnexa, part unspecified, initial encounter: Secondary | ICD-10-CM

## 2017-12-27 DIAGNOSIS — Z7984 Long term (current) use of oral hypoglycemic drugs: Secondary | ICD-10-CM | POA: Diagnosis not present

## 2017-12-27 DIAGNOSIS — T551X1A Toxic effect of detergents, accidental (unintentional), initial encounter: Secondary | ICD-10-CM | POA: Insufficient documentation

## 2017-12-27 MED ORDER — EYE WASH OPHTH SOLN
1.0000 [drp] | OPHTHALMIC | Status: DC | PRN
  Administered 2017-12-27: 1 [drp] via OPHTHALMIC
  Filled 2017-12-27 (×2): qty 118

## 2017-12-27 MED ORDER — TETRACAINE HCL 0.5 % OP SOLN
2.0000 [drp] | Freq: Once | OPHTHALMIC | Status: AC
  Administered 2017-12-27: 2 [drp] via OPHTHALMIC
  Filled 2017-12-27: qty 4

## 2017-12-27 MED ORDER — FLUORESCEIN SODIUM 1 MG OP STRP
1.0000 | ORAL_STRIP | Freq: Once | OPHTHALMIC | Status: AC
Start: 2017-12-27 — End: 2017-12-27
  Administered 2017-12-27: 1 via OPHTHALMIC
  Filled 2017-12-27: qty 1

## 2017-12-27 MED ORDER — BACITRACIN-POLYMYXIN B 500-10000 UNIT/GM OP OINT
TOPICAL_OINTMENT | Freq: Once | OPHTHALMIC | Status: AC
  Administered 2017-12-27: 1 via OPHTHALMIC
  Filled 2017-12-27: qty 3.5

## 2017-12-27 NOTE — ED Notes (Signed)
r eye  Irrigated  With sterile normal saline per morgan lens

## 2017-12-27 NOTE — Discharge Instructions (Addendum)
Follow-up with [facility] tomorrow morning.  Please call for an appointment.  Tell them you were seen in the emergency department and we discussed this with Dr. Neville Route.  Tell them he wants to see you in the office.
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
## 2017-12-27 NOTE — ED Notes (Signed)
Pt right eye flushed with a bottle of saline by holding the eye open and pouring the saline into the corner of the eye to flush. Pt tolerated well.
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
## 2018-02-15 ENCOUNTER — Other Ambulatory Visit: Payer: Self-pay

## 2018-02-15 ENCOUNTER — Encounter: Payer: Self-pay | Admitting: Emergency Medicine

## 2018-02-15 ENCOUNTER — Emergency Department
Admission: EM | Admit: 2018-02-15 | Discharge: 2018-02-15 | Disposition: A | Discharge status: Home / Self Care | Payer: 59 | Attending: Emergency Medicine | Admitting: Emergency Medicine

## 2018-02-15 DIAGNOSIS — Z79899 Other long term (current) drug therapy: Secondary | ICD-10-CM | POA: Diagnosis not present

## 2018-02-15 DIAGNOSIS — I10 Essential (primary) hypertension: Secondary | ICD-10-CM | POA: Diagnosis not present

## 2018-02-15 DIAGNOSIS — Z7984 Long term (current) use of oral hypoglycemic drugs: Secondary | ICD-10-CM | POA: Insufficient documentation

## 2018-02-15 DIAGNOSIS — J449 Chronic obstructive pulmonary disease, unspecified: Secondary | ICD-10-CM | POA: Diagnosis not present

## 2018-02-15 DIAGNOSIS — K047 Periapical abscess without sinus: Secondary | ICD-10-CM | POA: Diagnosis not present

## 2018-02-15 DIAGNOSIS — E119 Type 2 diabetes mellitus without complications: Secondary | ICD-10-CM | POA: Diagnosis not present

## 2018-02-15 DIAGNOSIS — Z87891 Personal history of nicotine dependence: Secondary | ICD-10-CM | POA: Insufficient documentation

## 2018-02-15 DIAGNOSIS — R600 Localized edema: Secondary | ICD-10-CM | POA: Diagnosis present

## 2018-02-15 MED ORDER — IBUPROFEN 600 MG PO TABS: 600.0000 mg | ORAL_TABLET | Freq: Three times a day (TID) | ORAL | 0 refills | Status: DC | PRN

## 2018-02-15 MED ORDER — PENICILLIN V POTASSIUM 500 MG PO TABS: 500.0000 mg | ORAL_TABLET | Freq: Four times a day (QID) | ORAL | 0 refills | Status: DC

## 2018-02-15 MED ORDER — CEFTRIAXONE SODIUM 1 G IJ SOLR
1.0000 g | Freq: Once | INTRAMUSCULAR | Status: AC
Start: 2018-02-15 — End: 2018-02-15
  Administered 2018-02-15: 1 g via INTRAMUSCULAR
  Filled 2018-02-15: qty 10

## 2018-02-15 MED ORDER — LIDOCAINE HCL (PF) 1 % IJ SOLN
5.0000 mL | Freq: Once | INTRAMUSCULAR | Status: AC
  Administered 2018-02-15: 5 mL
  Filled 2018-02-15: qty 5

## 2018-02-15 NOTE — ED Provider Notes (Signed)
[name] Emergency Department Provider Note  ___________________________________________   First MD Initiated Contact with Patient 02/15/18 1033     (approximate)  I have reviewed the triage vital signs and the nursing notes.   HISTORY  Chief Complaint Facial Swelling  HPI Nancy Patton is a 57 y.o. female presents to the ED with complaint of right facial pain.  Patient states that she developed dental pain approximately 2 days ago.  She states she woke up this morning with her face swollen.  She denies any fever or chills.  There is been no nausea or vomiting.  She rates her pain as 7 out of 10.   Past Medical History:  Diagnosis Date  . Asthma   . Diabetes mellitus without complication ([place])   . Hypertension   . Obesity   . Pulmonary hypertension [facility])     Patient Active Problem List   Diagnosis Date Noted  . COPD exacerbation ([place]) 08/24/2015  . HTN (hypertension) 07/10/2015  . Type 2 diabetes mellitus ([place]) 07/10/2015  . Depression 07/10/2015  . Acute respiratory distress 09/26/2014  . Asthmatic bronchitis with acute exacerbation 09/26/2014  . Asthmatic bronchitis with exacerbation 09/26/2014    Past Surgical History:  Procedure Laterality Date  . CATARACT EXTRACTION    . CESAREAN SECTION      Prior to Admission medications   Medication Sig Start Date End Date Taking? Authorizing Provider  albuterol (PROVENTIL HFA;VENTOLIN HFA) 108 (90 BASE) MCG/ACT inhaler Inhale 4-6 puffs by mouth every 4 hours as needed for wheezing, cough, and/or shortness of breath 07/08/14   Hinda Kehr, MD  albuterol (PROVENTIL) (2.5 MG/3ML) 0.083% nebulizer solution Take 3 mLs (2.5 mg total) by nebulization every 6 (six) hours as needed for wheezing or shortness of breath. 08/29/15   Sudini, Srikar, MD  BREO ELLIPTA 200-25 MCG/INH AEPB Inhale 1 puff into the lungs daily. 08/29/15   Hillary Bow, MD  citalopram (CELEXA) 20 MG tablet Take 20 mg by mouth  daily. 09/15/14   [provider]  glipiZIDE (GLIPIZIDE XL) 2.5 MG 24 hr tablet Take 1 tablet (2.5 mg total) by mouth daily with breakfast. 08/29/15   Hillary Bow, MD  ibuprofen (ADVIL,MOTRIN) 600 MG tablet Take 1 tablet (600 mg total) by mouth every 8 (eight) hours as needed. 02/15/18   Johnn Hai, PA-C  losartan-hydrochlorothiazide (HYZAAR) 50-12.5 MG per tablet Take 1 tablet by mouth daily. 09/15/14   [provider]  metFORMIN (GLUCOPHAGE) 1000 MG tablet Take 1,000 mg by mouth 2 (two) times daily. 09/09/14   [provider]  penicillin v potassium (VEETID) 500 MG tablet Take 1 tablet (500 mg total) by mouth 4 (four) times daily. 02/15/18   Johnn Hai, PA-C  tiotropium (SPIRIVA) 18 MCG inhalation capsule Place 1 capsule (18 mcg total) into inhaler and inhale daily. 08/29/15   Hillary Bow, MD    Allergies Patient has no known allergies.  Family History  Problem Relation Age of Onset  . Cancer Neg Hx   . Diabetes Neg Hx   . Heart failure Neg Hx   . Hypertension Neg Hx   . Breast cancer Neg Hx     Social History Social History   Tobacco Use  . Smoking status: Former Research scientist (life sciences)  . Smokeless tobacco: Never Used  Substance Use Topics  . Alcohol use: No    Comment: 2nd hand smoke  . Drug use: No    Review of Systems Constitutional: No fever/chills Eyes: No  visual changes. ENT: No sore throat.  Positive for dental pain. Cardiovascular: Denies chest pain. Respiratory: Denies shortness of breath. Musculoskeletal: Needed for muscle aches. Skin: Negative for rash. Neurological: Negative for headaches, focal weakness or numbness. ____________________________________________   PHYSICAL EXAM:  VITAL SIGNS: ED Triage Vitals [02/15/18 1032]  Enc Vitals Group     BP      Pulse      Resp      Temp      Temp src      SpO2      Weight      Height      Head Circumference      Peak Flow      Pain Score 7     Pain Loc      Pain Edu?       Excl. in [place]?    Constitutional: Alert and oriented. Well appearing and in no acute distress. Eyes: Conjunctivae are normal.  Head: Atraumatic. Nose: No congestion/rhinnorhea. Mouth/Throat: Mucous membranes are moist.  Oropharynx non-erythematous.  Right lower premolar and gum surrounding this area is moderately edematous and tender.  No active drainage is noted.  There is 1 premolar that is already been extracted in this area.  There is some soft tissue facial edema present on the right side. Neck: No stridor.   Hematological/Lymphatic/Immunilogical: No cervical lymphadenopathy. Cardiovascular: Normal rate, regular rhythm. Grossly normal heart sounds.  Good peripheral circulation. Respiratory: Normal respiratory effort.  No retractions. Lungs CTAB. Musculoskeletal: Moves upper and lower extremities without any difficulty.  Normal gait was noted. Neurologic:  Normal speech and language. No gross focal neurologic deficits are appreciated. No gait instability. Skin:  Skin is warm, dry and intact. No rash noted. Psychiatric: Mood and affect are normal. Speech and behavior are normal.  ____________________________________________   LABS (all labs ordered are listed, but only abnormal results are displayed)  Labs Reviewed - No data to display  PROCEDURES  Procedure(s) performed: None  Procedures  Critical Care performed: No  ____________________________________________   INITIAL IMPRESSION / ASSESSMENT AND PLAN / ED COURSE  As part of my medical decision making, I reviewed the following data within the electronic MEDICAL RECORD NUMBER Notes from prior ED visits and [name] Controlled Substance Database  57 year old presents to the ED with complaint of facial swelling and dental pain.  Patient states that it began hurting last evening and is worse today.  On exam patient has moderate amount of gum swelling right lower premolar area without any evidence of drainage.  Patient was given Rocephin 1  g IM while in the department.  She was also given a prescription for Pen-Vee K 500 mg 4 times daily for the next 7 days along with ibuprofen as needed for pain.  She was given list of dental clinics to follow-up with.  ____________________________________________   FINAL CLINICAL IMPRESSION(S) / ED DIAGNOSES  Final diagnoses:  Dental abscess     ED Discharge Orders         Ordered    penicillin v potassium (VEETID) 500 MG tablet  4 times daily     02/15/18 1103    ibuprofen (ADVIL,MOTRIN) 600 MG tablet  Every 8 hours PRN,   Status:  Discontinued     02/15/18 1103    ibuprofen (ADVIL,MOTRIN) 600 MG tablet  Every 8 hours PRN     02/15/18 1104           Note:  This document was prepared using Dragon voice recognition software and  may include unintentional dictation errors.    Johnn Hai, PA-C 02/15/18 1630    Earleen Newport, MD 02/18/18 [phone]

## 2018-02-15 NOTE — Discharge Instructions (Addendum)
Follow-up with your primary care or [facility] clinic acute care if any continued problems.  Make appointment with a dentist of your choice or 1 of the dentist listed on your discharge papers.  Begin taking Pen-Vee K 4 times daily for the next 7 days.  Ibuprofen 600 mg 3 times daily with food.  You may also take Tylenol if needed for pain.  Soft foods until you are able to eat normally.  OPTIONS FOR DENTAL FOLLOW UP CARE  [place] [facility] and [place] [email or web address] [phone])  [facility] [phone])  [place] [phone] ext [phone])  [place] [phone])  [facility] [phone]) This clinic caters to the indigent population and is on a lottery system. Location: [employer] [facility], [place], [place], [place] [facility] Hours: Wednesdays from 6pm - 9pm, patients seen by a lottery system. For dates, call or go to [email or web address] Services: Cleanings, fillings and simple extractions. Payment Options: DENTAL WORK IS FREE OF CHARGE. Bring proof of income or support. Best way to get seen: Arrive at 5:15 pm - this is a lottery, NOT first come/first serve, so arriving earlier will not increase your chances of being seen.     [facility] [phone] Select option 1 for emergencies   Location: [facility], [place], [address], [place] Clinic Hours: No walk-ins accepted - call the day before to schedule an appointment. Check in times are 9:30 am and 1:30 pm. Services: Simple extractions, temporary fillings, pulpectomy/pulp debridement, uncomplicated abscess drainage. Payment Options: PAYMENT IS DUE AT THE TIME OF SERVICE.  Fee is usually $100-200, additional surgical procedures (e.g. abscess drainage) may  be extra. Cash, checks, Visa/MasterCard accepted.  Can file Medicaid if patient is covered for dental - patient should call case worker to check. No discount for [facility] patients. Best way to get seen: MUST call the day before and get onto the schedule. Can usually be seen the next 1-2 days. No walk-ins accepted.     [place] [phone]   Location: [place], [place] Clinic Hours: M, W, Th, F 8am or 1:30pm, Tues 9a or 1:30 - first come/first served. Services: Simple extractions, temporary fillings, uncomplicated abscess drainage.  You do not need to be an [facility] resident. Payment Options: PAYMENT IS DUE AT THE TIME OF SERVICE. Dental insurance, otherwise sliding scale - bring proof of income or support. Depending on income and treatment needed, cost is usually $50-200. Best way to get seen: Arrive early as it is first come/first served.     [facility] [phone]   Location: [place] [facility] Hours: Mon-Thu 8a-5p Services: Most basic dental services including extractions and fillings. Payment Options: PAYMENT IS DUE AT THE TIME OF SERVICE. Sliding scale, up to 50% off - bring proof if income or support. Medicaid with dental option accepted. Best way to get seen: Call to schedule an appointment, can usually be seen within 2 weeks OR they will try to see walk-ins - show up at Economy or 2p (you may have to wait).     [facility] [place] RESIDENTS ONLY   Location: [facility], [place] [address], [place], [place] [address] Clinic Hours: By appointment only. Monday - Thursday 8am-5pm, Friday 8am-12pm Services: Cleanings, fillings, extractions. Payment Options:  PAYMENT IS DUE AT THE TIME OF SERVICE. Cash, Visa or MasterCard. Sliding scale - $30 minimum per service. Best way to get seen: Come in to  office, complete packet and make an appointment - need proof of income or support monies for each household member and proof of [facility] residence. Usually takes about a month to get in.     [facility] [phone]   Location: [address] [place] Clinic Hours: Walk-in Urgent Care Dental Services are offered Monday-Friday mornings only. The numbers of emergencies accepted daily is limited to the number of providers available. Maximum 15 - Mondays, Wednesdays & Thursdays Maximum 10 - Tuesdays & Fridays Services: You do not need to be a [facility] resident to be seen for a dental emergency. Emergencies are defined as pain, swelling, abnormal bleeding, or dental trauma. Walkins will receive x-rays if needed. NOTE: Dental cleaning is not an emergency. Payment Options: PAYMENT IS DUE AT THE TIME OF SERVICE. Minimum co-pay is $40.00 for uninsured patients. Minimum co-pay is $3.00 for Medicaid with dental coverage. Dental Insurance is accepted and must be presented at time of visit. Medicare does not cover dental. Forms of payment: Cash, credit card, checks. Best way to get seen: If not previously registered with the clinic, walk-in dental registration begins at 7:15 am and is on a first come/first serve basis. If previously registered with the clinic, call to make an appointment.     [facility] [place] ONLY   Location: [address] [place], [place] Clinic Hours: Mon-Thu 10a-2p Services: Extractions only! Payment Options: FREE (donations accepted) - bring proof of income or support Best way to get seen: Call and schedule an appointment OR come at 8am on the 1st Monday of every month (except for holidays) when it is first come/first served.     [facility] [phone]   Location: [place], [place] Clinic Hours: Friday mornings Services, Payment Options, Best way to get  seen: Call for info

## 2018-02-15 NOTE — ED Triage Notes (Signed)
States she developed some dental pain about 2 days ago  Woke up with facial swelling yesterday

## 2018-04-07 ENCOUNTER — Emergency Department: Payer: 59

## 2018-04-07 ENCOUNTER — Other Ambulatory Visit: Payer: Self-pay

## 2018-04-07 ENCOUNTER — Emergency Department
Admission: EM | Admit: 2018-04-07 | Discharge: 2018-04-07 | Disposition: A | Discharge status: Home / Self Care | Payer: 59 | Attending: Student in an Organized Health Care Education/Training Program | Admitting: Student in an Organized Health Care Education/Training Program

## 2018-04-07 ENCOUNTER — Encounter: Payer: Self-pay | Admitting: Intensive Care

## 2018-04-07 DIAGNOSIS — I10 Essential (primary) hypertension: Secondary | ICD-10-CM | POA: Diagnosis not present

## 2018-04-07 DIAGNOSIS — R0602 Shortness of breath: Secondary | ICD-10-CM | POA: Diagnosis present

## 2018-04-07 DIAGNOSIS — Z87891 Personal history of nicotine dependence: Secondary | ICD-10-CM | POA: Diagnosis not present

## 2018-04-07 DIAGNOSIS — Z79899 Other long term (current) drug therapy: Secondary | ICD-10-CM | POA: Diagnosis not present

## 2018-04-07 DIAGNOSIS — Z7984 Long term (current) use of oral hypoglycemic drugs: Secondary | ICD-10-CM | POA: Diagnosis not present

## 2018-04-07 DIAGNOSIS — E119 Type 2 diabetes mellitus without complications: Secondary | ICD-10-CM | POA: Insufficient documentation

## 2018-04-07 DIAGNOSIS — J4521 Mild intermittent asthma with (acute) exacerbation: Secondary | ICD-10-CM

## 2018-04-07 LAB — TROPONIN I: Troponin I: <0.03 ng/mL (ref <0.03)

## 2018-04-07 LAB — BASIC METABOLIC PANEL
Anion gap: 10 (ref 5–15)
BUN: 16 mg/dL (ref 6–20)
CO2: 26 mmol/L (ref 22–32)
Calcium: 8.9 mg/dL (ref 8.9–10.3)
Chloride: 104 mmol/L (ref 98–111)
Creatinine, Ser: 0.9 mg/dL (ref 0.44–1.00)
GFR calc Af Amer: >60 mL/min (ref >60)
GFR calc non Af Amer: >60 mL/min (ref >60)
Glucose, Bld: 138 mg/dL — ABNORMAL HIGH (ref 70–99)
Potassium: 4 mmol/L (ref 3.5–5.1)
Sodium: 140 mmol/L (ref 135–145)

## 2018-04-07 LAB — CBC
HCT: 38.1 % (ref 36.0–46.0)
Hemoglobin: 12.5 g/dL (ref 12.0–15.0)
MCH: 29.5 pg (ref 26.0–34.0)
MCHC: 32.8 g/dL (ref 30.0–36.0)
MCV: 89.9 fL (ref 80.0–100.0)
Platelets: 259 10*3/uL (ref 150–400)
RBC: 4.24 MIL/uL (ref 3.87–5.11)
RDW: 13.2 % (ref 11.5–15.5)
WBC: 6.8 10*3/uL (ref 4.0–10.5)
nRBC: 0 % (ref 0.0–0.2)

## 2018-04-07 MED ORDER — IPRATROPIUM-ALBUTEROL 0.5-2.5 (3) MG/3ML IN SOLN
3.0000 mL | Freq: Once | RESPIRATORY_TRACT | Status: AC
  Administered 2018-04-07: 3 mL via RESPIRATORY_TRACT
  Filled 2018-04-07: qty 3

## 2018-04-07 MED ORDER — ALBUTEROL SULFATE (2.5 MG/3ML) 0.083% IN NEBU: 2.5000 mg | INHALATION_SOLUTION | Freq: Four times a day (QID) | RESPIRATORY_TRACT | 3 refills | Status: DC | PRN

## 2018-04-07 MED ORDER — AZITHROMYCIN 500 MG PO TABS: 500.0000 mg | ORAL_TABLET | Freq: Every day | ORAL | 0 refills | Status: AC

## 2018-04-07 MED ORDER — PREDNISONE 10 MG PO TABS: 10.0000 mg | ORAL_TABLET | Freq: Every day | ORAL | 0 refills | Status: DC

## 2018-04-07 MED ORDER — PREDNISONE 20 MG PO TABS
60.0000 mg | ORAL_TABLET | Freq: Once | ORAL | Status: AC
  Administered 2018-04-07: 60 mg via ORAL
  Filled 2018-04-07: qty 3

## 2018-04-07 MED ORDER — ALBUTEROL SULFATE (2.5 MG/3ML) 0.083% IN NEBU
5.0000 mg | INHALATION_SOLUTION | Freq: Once | RESPIRATORY_TRACT | Status: AC
  Administered 2018-04-07: 5 mg via RESPIRATORY_TRACT
  Filled 2018-04-07: qty 6

## 2018-04-07 NOTE — ED Triage Notes (Signed)
Patient was put on steroid X4 days ago by PCP for asthma and today reports the SOB has gotten worse. Denies pain.

## 2018-04-07 NOTE — ED Notes (Signed)
Patient transported to X-ray 

## 2018-04-07 NOTE — ED Notes (Signed)
Pt ambulated with pulse ox and remained at 98% with no c/o of SOB.

## 2018-04-07 NOTE — ED Notes (Signed)
Pt verbalized understanding of discharge instructions. NAD at this time. 

## 2018-04-07 NOTE — ED Provider Notes (Signed)
[facility] Emergency Department Provider Note    First MD Initiated Contact with Patient 04/07/18 1720     (approximate)  I have reviewed the triage vital signs and the nursing notes.   HISTORY  Chief Complaint Shortness of Breath    HPI Nancy Patton is a 58 y.o. female with a history of asthma and diabetes as well as pulmonary hypertension presents the ER for over 1 week of progressively worsening shortness of breath and wheezing.  Patient saw her PCP and was given course of steroids for 4 days which ended today the patient was not felt like she was getting any better.  Also has been taking her nebulizer treatments at home with some relief but ran out of refills today and called the pharmacy who stated she could not get a refill until March.  She denies any chest pain.  No lower extremity swelling.  Does still feel short of breath at this time.   Past Medical History:  Diagnosis Date  . Asthma   . Diabetes mellitus without complication ([place])   . Hypertension   . Obesity   . Pulmonary hypertension ([facility])    Family History  Problem Relation Age of Onset  . Cancer Neg Hx   . Diabetes Neg Hx   . Heart failure Neg Hx   . Hypertension Neg Hx   . Breast cancer Neg Hx    Past Surgical History:  Procedure Laterality Date  . CATARACT EXTRACTION    . CESAREAN SECTION     Patient Active Problem List   Diagnosis Date Noted  . COPD exacerbation ([place]) 08/24/2015  . HTN (hypertension) 07/10/2015  . Type 2 diabetes mellitus ([place]) 07/10/2015  . Depression 07/10/2015  . Acute respiratory distress 09/26/2014  . Asthmatic bronchitis with acute exacerbation 09/26/2014  . Asthmatic bronchitis with exacerbation 09/26/2014      Prior to Admission medications   Medication Sig Start Date End Date Taking? Authorizing Provider  albuterol (PROVENTIL HFA;VENTOLIN HFA) 108 (90 BASE) MCG/ACT inhaler Inhale 4-6 puffs by mouth every 4 hours as needed for  wheezing, cough, and/or shortness of breath 07/08/14   Hinda Kehr, MD  albuterol (PROVENTIL) (2.5 MG/3ML) 0.083% nebulizer solution Take 3 mLs (2.5 mg total) by nebulization every 6 (six) hours as needed for wheezing or shortness of breath. 08/29/15   Sudini, Srikar, MD  BREO ELLIPTA 200-25 MCG/INH AEPB Inhale 1 puff into the lungs daily. 08/29/15   Hillary Bow, MD  citalopram (CELEXA) 20 MG tablet Take 20 mg by mouth daily. 09/15/14   [provider]  glipiZIDE (GLIPIZIDE XL) 2.5 MG 24 hr tablet Take 1 tablet (2.5 mg total) by mouth daily with breakfast. 08/29/15   Hillary Bow, MD  ibuprofen (ADVIL,MOTRIN) 600 MG tablet Take 1 tablet (600 mg total) by mouth every 8 (eight) hours as needed. 02/15/18   Johnn Hai, PA-C  losartan-hydrochlorothiazide (HYZAAR) 50-12.5 MG per tablet Take 1 tablet by mouth daily. 09/15/14   [provider]  metFORMIN (GLUCOPHAGE) 1000 MG tablet Take 1,000 mg by mouth 2 (two) times daily. 09/09/14   [provider]  penicillin v potassium (VEETID) 500 MG tablet Take 1 tablet (500 mg total) by mouth 4 (four) times daily. 02/15/18   Johnn Hai, PA-C  tiotropium (SPIRIVA) 18 MCG inhalation capsule Place 1 capsule (18 mcg total) into inhaler and inhale daily. 08/29/15   Hillary Bow, MD    Allergies Patient has no known allergies.  Social History Social History   Tobacco Use  . Smoking status: Former Smoker    Types: Cigarettes  . Smokeless tobacco: Never Used  Substance Use Topics  . Alcohol use: No    Comment: 2nd hand smoke  . Drug use: No    Review of Systems Patient denies headaches, rhinorrhea, blurry vision, numbness, shortness of breath, chest pain, edema, cough, abdominal pain, nausea, vomiting, diarrhea, dysuria, fevers, rashes or hallucinations unless otherwise stated above in HPI. ____________________________________________   PHYSICAL EXAM:  VITAL SIGNS: Vitals:   04/07/18 1704  BP: 138/77  Pulse:  88  Resp: 20  Temp: 98.6 F (37 C)  SpO2: 96%    Constitutional: Alert and oriented.  Eyes: Conjunctivae are normal.  Head: Atraumatic. Nose: No congestion/rhinnorhea. Mouth/Throat: Mucous membranes are moist.   Neck: No stridor. Painless ROM.  Cardiovascular: Normal rate, regular rhythm. Grossly normal heart sounds.  Good peripheral circulation. Respiratory: Normal respiratory effort.  No retractions. Lungs with diffuse wheeze. Gastrointestinal: Soft and nontender. No distention. No abdominal bruits. No CVA tenderness. Genitourinary:  Musculoskeletal: No lower extremity tenderness nor edema.  No joint effusions. Neurologic:  Normal speech and language. No gross focal neurologic deficits are appreciated. No facial droop Skin:  Skin is warm, dry and intact. No rash noted. Psychiatric: Mood and affect are normal. Speech and behavior are normal.  ____________________________________________   LABS (all labs ordered are listed, but only abnormal results are displayed)  Results for orders placed or performed during the hospital encounter of 04/07/18 (from the past 24 hour(s))  CBC     Status: None   Collection Time: 04/07/18  5:03 PM  Result Value Ref Range   WBC 6.8 4.0 - 10.5 K/uL   RBC 4.24 3.87 - 5.11 MIL/uL   Hemoglobin 12.5 12.0 - 15.0 g/dL   HCT 38.1 36.0 - 46.0 %   MCV 89.9 80.0 - 100.0 fL   MCH 29.5 26.0 - 34.0 pg   MCHC 32.8 30.0 - 36.0 g/dL   RDW 13.2 11.5 - 15.5 %   Platelets 259 150 - 400 K/uL   nRBC 0.0 0.0 - 0.2 %   ____________________________________________  EKG My review and personal interpretation at Time: 17:03   Indication: sob  Rate: 85  Rhythm: sinus Axis: normal Other: normal intervals, no stemi ____________________________________________  RADIOLOGY  I personally reviewed all radiographic images ordered to evaluate for the above acute complaints and reviewed radiology reports and findings.  These findings were personally discussed with the  patient.  Please see medical record for radiology report.  ____________________________________________   PROCEDURES  Procedure(s) performed:  Procedures    Critical Care performed: no ____________________________________________   INITIAL IMPRESSION / ASSESSMENT AND PLAN / ED COURSE  Pertinent labs & imaging results that were available during my care of the patient were reviewed by me and considered in my medical decision making (see chart for details).   DDX: asthma, copd, pna, bronchitis, chf  Donell S Laurine Patton is a 58 y.o. who presents to the ED with symptoms consistent with asthma exacerbation the patient has been having more productive cough for the past several days.  No fevers or chills.  Doubt flu.  Blood work will be sent for the above differential to evaluate for pneumonia or consolidation.  Doubt PE or congestive heart failure.  Clinical Course as of Apr 07 1848  Sun Apr 07, 2018  1845 Patient reassessed.  Still with some wheezing but it is improved she is no respiratory  distress.  No hypoxia with ambulation.  No clear consolidation but she is having productive cough and symptoms persisting therefore will treat with azithromycin.  Will extend prednisone taper and give refills for her nebulizers.  Discussed signs and symptoms for which she should return to the ER immediately.  Have discussed with the patient and available family all diagnostics and treatments performed thus far and all questions were answered to the best of my ability. The patient demonstrates understanding and agreement with plan.    [PR]    Clinical Course User Index [PR] Merlyn Lot, MD     As part of my medical decision making, I reviewed the following data within the [place] notes reviewed and incorporated, Labs reviewed, notes from prior ED visits and [place] Controlled Substance Database   ____________________________________________   FINAL CLINICAL  IMPRESSION(S) / ED DIAGNOSES  Final diagnoses:  Mild intermittent asthma with exacerbation      NEW MEDICATIONS STARTED DURING THIS VISIT:  New Prescriptions   No medications on file     Note:  This document was prepared using Dragon voice recognition software and may include unintentional dictation errors.    Merlyn Lot, MD 04/07/18 1850

## 2018-10-05 ENCOUNTER — Other Ambulatory Visit: Payer: Self-pay

## 2018-10-05 ENCOUNTER — Emergency Department
Admission: EM | Admit: 2018-10-05 | Discharge: 2018-10-05 | Disposition: A | Discharge status: Home / Self Care | Payer: 59 | Attending: Emergency Medicine | Admitting: Emergency Medicine

## 2018-10-05 ENCOUNTER — Emergency Department: Payer: 59

## 2018-10-05 DIAGNOSIS — J449 Chronic obstructive pulmonary disease, unspecified: Secondary | ICD-10-CM | POA: Diagnosis not present

## 2018-10-05 DIAGNOSIS — I10 Essential (primary) hypertension: Secondary | ICD-10-CM | POA: Insufficient documentation

## 2018-10-05 DIAGNOSIS — Z7984 Long term (current) use of oral hypoglycemic drugs: Secondary | ICD-10-CM | POA: Diagnosis not present

## 2018-10-05 DIAGNOSIS — J45909 Unspecified asthma, uncomplicated: Secondary | ICD-10-CM | POA: Insufficient documentation

## 2018-10-05 DIAGNOSIS — E119 Type 2 diabetes mellitus without complications: Secondary | ICD-10-CM | POA: Diagnosis not present

## 2018-10-05 DIAGNOSIS — Z79899 Other long term (current) drug therapy: Secondary | ICD-10-CM | POA: Diagnosis not present

## 2018-10-05 DIAGNOSIS — M1711 Unilateral primary osteoarthritis, right knee: Secondary | ICD-10-CM | POA: Diagnosis not present

## 2018-10-05 DIAGNOSIS — Z87891 Personal history of nicotine dependence: Secondary | ICD-10-CM | POA: Diagnosis not present

## 2018-10-05 DIAGNOSIS — M25561 Pain in right knee: Secondary | ICD-10-CM | POA: Diagnosis present

## 2018-10-05 MED ORDER — TRAMADOL HCL 50 MG PO TABS
50.0000 mg | ORAL_TABLET | Freq: Once | ORAL | Status: AC
  Administered 2018-10-05: 50 mg via ORAL
  Filled 2018-10-05: qty 1

## 2018-10-05 MED ORDER — TRAMADOL HCL 50 MG PO TABS: 50.0000 mg | ORAL_TABLET | Freq: Four times a day (QID) | ORAL | 0 refills | Status: DC | PRN

## 2018-10-05 NOTE — ED Triage Notes (Signed)
Pt states she was stepping over a dog gate and had stuff in hands. Fell stepping over gate. Fell on R knee. A&O, in wheelchair.

## 2018-10-05 NOTE — ED Notes (Addendum)
Pt /o right knee pain d/t falling over a dog gate this morning. Pt states she has had problems with this knee and has been taking NSAIDS for the inflammation. Pt states she is unable to put any pressure on her right leg.

## 2018-10-05 NOTE — Discharge Instructions (Signed)
Please follow up with the orthopedic doctor if not improving over the week.  Return to the ER for symptoms that change or worsen if unable to schedule an appointment.

## 2018-10-05 NOTE — ED Provider Notes (Signed)
[facility] Emergency Department Provider Note ____________________________________________  Time seen: Approximately 8:29 AM  I have reviewed the triage vital signs and the nursing notes.   HISTORY  Chief Complaint Fall    HPI Nancy Patton is a 58 y.o. female who presents to the emergency department for evaluation and treatment of right knee pain post mechanical, non-syncopal fall this morning. She was stepping over a baby gate and her right foot was her leg foot and it got caught on the top of the baby gate.  She fell forward landing on the right knee.  She has a history of arthritis in that knee, but has otherwise not had any previous injury.  No alleviating measures attempted prior to arrival.  Past Medical History:  Diagnosis Date  . Asthma   . Diabetes mellitus without complication ([place])   . Hypertension   . Obesity   . Pulmonary hypertension [facility])     Patient Active Problem List   Diagnosis Date Noted  . COPD exacerbation ([place]) 08/24/2015  . HTN (hypertension) 07/10/2015  . Type 2 diabetes mellitus ([place]) 07/10/2015  . Depression 07/10/2015  . Acute respiratory distress 09/26/2014  . Asthmatic bronchitis with acute exacerbation 09/26/2014  . Asthmatic bronchitis with exacerbation 09/26/2014    Past Surgical History:  Procedure Laterality Date  . CATARACT EXTRACTION    . CESAREAN SECTION      Prior to Admission medications   Medication Sig Start Date End Date Taking? Authorizing Provider  albuterol (PROVENTIL HFA;VENTOLIN HFA) 108 (90 BASE) MCG/ACT inhaler Inhale 4-6 puffs by mouth every 4 hours as needed for wheezing, cough, and/or shortness of breath 07/08/14   Hinda Kehr, MD  albuterol (PROVENTIL) (2.5 MG/3ML) 0.083% nebulizer solution Take 3 mLs (2.5 mg total) by nebulization every 6 (six) hours as needed for wheezing or shortness of breath. 04/07/18   Merlyn Lot, MD  BREO ELLIPTA 200-25 MCG/INH AEPB Inhale 1 puff into  the lungs daily. 08/29/15   Hillary Bow, MD  citalopram (CELEXA) 20 MG tablet Take 20 mg by mouth daily. 09/15/14   [provider]  glipiZIDE (GLIPIZIDE XL) 2.5 MG 24 hr tablet Take 1 tablet (2.5 mg total) by mouth daily with breakfast. 08/29/15   Hillary Bow, MD  ibuprofen (ADVIL,MOTRIN) 600 MG tablet Take 1 tablet (600 mg total) by mouth every 8 (eight) hours as needed. 02/15/18   Johnn Hai, PA-C  losartan-hydrochlorothiazide (HYZAAR) 50-12.5 MG per tablet Take 1 tablet by mouth daily. 09/15/14   [provider]  metFORMIN (GLUCOPHAGE) 1000 MG tablet Take 1,000 mg by mouth 2 (two) times daily. 09/09/14   [provider]  penicillin v potassium (VEETID) 500 MG tablet Take 1 tablet (500 mg total) by mouth 4 (four) times daily. 02/15/18   Johnn Hai, PA-C  predniSONE (DELTASONE) 10 MG tablet Take 1 tablet (10 mg total) by mouth daily. Day 1-2: Take 50 mg  ( 5 pills) Day 3-4 : Take 40 mg (4pills) Day 5-6: Take 30 mg (3 pills) Day 7-8:  Take 20 mg (2 pills) Day 9:  Take 10mg  (1 pill) 04/07/18   Merlyn Lot, MD  tiotropium (SPIRIVA) 18 MCG inhalation capsule Place 1 capsule (18 mcg total) into inhaler and inhale daily. 08/29/15   Hillary Bow, MD  traMADol (ULTRAM) 50 MG tablet Take 1 tablet (50 mg total) by mouth every 6 (six) hours as needed. 10/05/18   Victorino Dike, FNP    Allergies Patient has no known  allergies.  Family History  Problem Relation Age of Onset  . Cancer Neg Hx   . Diabetes Neg Hx   . Heart failure Neg Hx   . Hypertension Neg Hx   . Breast cancer Neg Hx     Social History Social History   Tobacco Use  . Smoking status: Former Smoker    Types: Cigarettes  . Smokeless tobacco: Never Used  Substance Use Topics  . Alcohol use: No    Comment: 2nd hand smoke  . Drug use: No    Review of Systems Constitutional: Negative for fever. Cardiovascular: Negative for chest pain. Respiratory: Negative for shortness of  breath. Musculoskeletal: Positive for right knee pain. Skin: Negative for open wounds or lesions Neurological: Negative for decrease in sensation  ____________________________________________   PHYSICAL EXAM:  VITAL SIGNS: ED Triage Vitals  Enc Vitals Group     BP 10/05/18 0809 (!) 141/82     Pulse Rate 10/05/18 0809 81     Resp 10/05/18 0809 16     Temp 10/05/18 0809 98.7 F (37.1 C)     Temp Source 10/05/18 0809 Oral     SpO2 10/05/18 0809 97 %     Weight 10/05/18 0808 267 lb (121.1 kg)     Height 10/05/18 0808 5\' 6"  (1.676 m)     Head Circumference --      Peak Flow --      Pain Score 10/05/18 0808 9     Pain Loc --      Pain Edu? --      Excl. in [place]? --     Constitutional: Alert and oriented. Well appearing and in no acute distress. Eyes: Conjunctivae are clear without discharge or drainage Head: Atraumatic Neck: Supple.  No focal midline tenderness. Respiratory: No cough. Respirations are even and unlabored. Musculoskeletal: Tenderness on palpation over the right knee just distal to the patella.  No obvious deformity.  Ballottement test is negative.  She is able to flex and extend. Neurologic: Awake, alert, oriented x4. Skin: Negative for open wounds or lesions. Psychiatric: Affect and behavior are appropriate.  ____________________________________________   LABS (all labs ordered are listed, but only abnormal results are displayed)  Labs Reviewed - No data to display ____________________________________________  RADIOLOGY  Right knee demonstrates no acute findings.  She does have some osteoarthritis. ____________________________________________   PROCEDURES  Procedures  ____________________________________________   INITIAL IMPRESSION / ASSESSMENT AND PLAN / ED COURSE  Nancy Patton is a 58 y.o. who presents to the emergency department for treatment and evaluation after mechanical, non-syncopal fall at home this morning while carrying some  stuff outside to put in a yard sale.  She landed directly on the right knee.  She denies striking her head or experiencing any loss of consciousness.  She has no pain in the right hip or ankle.  X-ray of the right knee shows no acute findings per radiology.  Images were also reviewed by me.  Patient requested a brace and will be placed in a knee immobilizer.  She also requested crutches and will receive those as well.  Patient instructed to follow-up with orthopedics if not improving over the week.[place] Kitchen  She was also instructed to return to the emergency department for symptoms that change or worsen if unable schedule an appointment with orthopedics or primary care.  Medications  traMADol (ULTRAM) tablet 50 mg (50 mg Oral Given 10/05/18 0956)    Pertinent labs & imaging results that were available during  my care of the patient were reviewed by me and considered in my medical decision making (see chart for details).  _________________________________________   FINAL CLINICAL IMPRESSION(S) / ED DIAGNOSES  Final diagnoses:  Osteoarthritis of right knee, unspecified osteoarthritis type    ED Discharge Orders         Ordered    traMADol (ULTRAM) 50 MG tablet  Every 6 hours PRN     10/05/18 0939           If controlled substance prescribed during this visit, 12 month history viewed on the [place] prior to issuing an initial prescription for Schedule II or III opiod.   Victorino Dike, FNP 10/05/18 1002    Blake Divine, MD 10/05/18 1558

## 2019-02-10 ENCOUNTER — Telehealth: Payer: 59 | Admitting: Physician Assistant

## 2019-02-10 DIAGNOSIS — R05 Cough: Secondary | ICD-10-CM

## 2019-02-10 DIAGNOSIS — R0602 Shortness of breath: Secondary | ICD-10-CM

## 2019-02-10 DIAGNOSIS — R059 Cough, unspecified: Secondary | ICD-10-CM

## 2019-02-10 NOTE — Progress Notes (Signed)
Based on what you shared with me, I feel your condition warrants further evaluation and I recommend that you be seen for a face to face office visit.  I highly recommend seeking face to face evaluation at an urgent care or emergency department if you are feeling short of breath. At the very least it will be important to check your oxygen levels to make sure they are not low.     NOTE: If you entered your credit card information for this eVisit, you will not be charged. You may see a "hold" on your card for the $35 but that hold will drop off and you will not have a charge processed.   If you are having a true medical emergency please call 911.      For an urgent face to face visit, [place] has five urgent care centers for your convenience:      NEW:  [facility] [place] at [place] Get Driving Directions [id number] [place], [place] [address] . 10 am - 6pm Monday - Friday    [place] [facility] [place] [facility]) Get Driving Directions [id number] [address] [place], [place] [address] . 10 am to 8 pm Monday-Friday . 12 pm to 8 pm [facility] at [facility] Get Driving Directions [id number] [place], [place], [place] [address] . 8 am to 8 pm Monday-Friday . 9 am to 6 pm Saturday . 11 am to 6 pm Sunday     [facility] at [facility] Get Driving Directions  [id number] [address], [place] [address] . 8 am to 8 pm Monday-Friday . 8 am to 4 pm [facility] at [place] Get Driving Directions [id number] [place], [place] [address] . 12 pm to 6 pm Monday-Friday      Your e-visit answers were reviewed by a board certified advanced clinical practitioner to complete your personal care plan.  Thank you for using e-Visits.   Approximately 5 minutes of time have been  spent researching, coordinating, and implementing care for this patient today.

## 2019-03-21 ENCOUNTER — Other Ambulatory Visit: Payer: Self-pay | Admitting: Internal Medicine

## 2019-03-21 DIAGNOSIS — Z1231 Encounter for screening mammogram for malignant neoplasm of breast: Secondary | ICD-10-CM

## 2019-04-18 ENCOUNTER — Ambulatory Visit
Admission: RE | Admit: 2019-04-18 | Discharge: 2019-04-18 | Disposition: A | Discharge status: Home / Self Care | Payer: No Typology Code available for payment source | Source: Ambulatory Visit | Attending: Internal Medicine | Admitting: Internal Medicine

## 2019-04-18 DIAGNOSIS — Z1231 Encounter for screening mammogram for malignant neoplasm of breast: Secondary | ICD-10-CM | POA: Insufficient documentation

## 2019-04-24 ENCOUNTER — Other Ambulatory Visit: Payer: Self-pay | Admitting: Internal Medicine

## 2019-04-24 DIAGNOSIS — R928 Other abnormal and inconclusive findings on diagnostic imaging of breast: Secondary | ICD-10-CM

## 2019-04-24 DIAGNOSIS — N6310 Unspecified lump in the right breast, unspecified quadrant: Secondary | ICD-10-CM

## 2019-04-29 ENCOUNTER — Ambulatory Visit
Admission: RE | Admit: 2019-04-29 | Discharge: 2019-04-29 | Disposition: A | Discharge status: Home / Self Care | Payer: 59 | Source: Ambulatory Visit | Attending: Internal Medicine | Admitting: Internal Medicine

## 2019-04-29 DIAGNOSIS — R928 Other abnormal and inconclusive findings on diagnostic imaging of breast: Secondary | ICD-10-CM

## 2019-04-29 DIAGNOSIS — N6310 Unspecified lump in the right breast, unspecified quadrant: Secondary | ICD-10-CM

## 2019-04-30 ENCOUNTER — Other Ambulatory Visit: Payer: Self-pay | Admitting: Family

## 2019-04-30 DIAGNOSIS — N6310 Unspecified lump in the right breast, unspecified quadrant: Secondary | ICD-10-CM

## 2019-04-30 DIAGNOSIS — R928 Other abnormal and inconclusive findings on diagnostic imaging of breast: Secondary | ICD-10-CM

## 2019-05-07 ENCOUNTER — Ambulatory Visit
Admission: RE | Admit: 2019-05-07 | Discharge: 2019-05-07 | Disposition: A | Discharge status: Home / Self Care | Payer: 59 | Source: Ambulatory Visit | Attending: Family | Admitting: Family

## 2019-05-07 DIAGNOSIS — R928 Other abnormal and inconclusive findings on diagnostic imaging of breast: Secondary | ICD-10-CM

## 2019-05-07 DIAGNOSIS — N6310 Unspecified lump in the right breast, unspecified quadrant: Secondary | ICD-10-CM | POA: Diagnosis present

## 2019-05-12 ENCOUNTER — Encounter: Payer: Self-pay | Admitting: *Deleted

## 2019-05-12 DIAGNOSIS — C50911 Malignant neoplasm of unspecified site of right female breast: Secondary | ICD-10-CM

## 2019-05-12 NOTE — Progress Notes (Signed)
Called patient today to establish navigation services.  Patient is newly diagnosed with invasive mammary carcinoma.  Molecular studies are pending.  I have put in a referral request to [facility] for consultation in [place] and for medical oncology consult.  I will notify patient of her appointments.

## 2019-05-13 ENCOUNTER — Other Ambulatory Visit: Payer: Self-pay | Admitting: *Deleted

## 2019-05-13 ENCOUNTER — Encounter: Payer: Self-pay | Admitting: *Deleted

## 2019-05-13 LAB — SURGICAL PATHOLOGY

## 2019-05-13 NOTE — Progress Notes (Signed)
Talked to patient today.  [facility] could not get her in the [place] office until after mid April.  Patient does not want to go to [place].  I have scheduled to see Dr. Luther Bradley for surgical consult on 05/15/19 @ 11:30 and Dr. Janese Banks for medical oncology consult on 05/20/19 @ 1:30.  She is to pick up her patient breast cancer educational literature, "My Breast Cancer Treatment Handbook" by Josephine Igo, RN at her appointment with Dr. Luther Bradley.  She is to call with any questions or needs.

## 2019-05-15 ENCOUNTER — Encounter: Payer: Self-pay | Admitting: Surgery

## 2019-05-15 ENCOUNTER — Ambulatory Visit: Payer: 59 | Admitting: Surgery

## 2019-05-15 ENCOUNTER — Other Ambulatory Visit: Payer: Self-pay | Admitting: Surgery

## 2019-05-15 ENCOUNTER — Ambulatory Visit: Payer: Self-pay | Admitting: Surgery

## 2019-05-15 ENCOUNTER — Other Ambulatory Visit: Payer: Self-pay

## 2019-05-15 VITALS — BP 131/90 | HR 78 | Temp 96.2°F | Ht 66.0 in | Wt 268.4 lb

## 2019-05-15 DIAGNOSIS — Z170 Estrogen receptor positive status [ER+]: Secondary | ICD-10-CM

## 2019-05-15 DIAGNOSIS — C50911 Malignant neoplasm of unspecified site of right female breast: Secondary | ICD-10-CM | POA: Insufficient documentation

## 2019-05-15 DIAGNOSIS — C50011 Malignant neoplasm of nipple and areola, right female breast: Secondary | ICD-10-CM

## 2019-05-15 HISTORY — DX: Malignant neoplasm of unspecified site of right female breast: C50.911

## 2019-05-15 NOTE — Patient Instructions (Signed)
Our surgery scheduler Marzetta Board will contact you within the next 24-48 hours. During the call, Marzetta Board will discuss the preparation prior to surgery and she will discuss the different dates and times. Please have the BLUE sheet available when she contacts you. If you have any questions or concerns, please feel free to contact our office.    Lumpectomy  A lumpectomy, sometimes called a partial mastectomy, is surgery to remove a cancerous tumor or mass (the lump) from a breast. It is a form of breast-conserving or breast-preservation surgery. This means that the cancerous tissue is removed but the breast remains intact. During a lumpectomy, the portion of the breast that contains the tumor is removed. Some normal tissue around the lump may be taken out to make sure that all of the tumor has been removed. Lymph nodes under your arm may also be removed and tested to find out if the cancer has spread. Lymph nodes are part of the body's disease-fighting system (immune system) and are usually the first place where breast cancer spreads. Tell a health care provider about:  Any allergies you have.  All medicines you are taking, including vitamins, herbs, eye drops, creams, and over-the-counter medicines.  Any problems you or family members have had with anesthetic medicines.  Any blood disorders you have.  Any surgeries you have had.  Any medical conditions you have.  Whether you are pregnant or may be pregnant. What are the risks? Generally, this is a safe procedure. However, problems may occur, including:  Bleeding.  Infection.  Allergic reaction to medicines.  Pain, swelling, weakness, or numbness in the arm on the side of your surgery.  Temporary swelling.  Change in the shape of the breast, particularly if a large portion is removed.  Scar tissue that forms at the surgical site and feels hard to the touch.  Blood clots. What happens before the procedure? Staying hydrated Follow  instructions from your health care provider about hydration, which may include:  Up to 2 hours before the procedure - you may continue to drink clear liquids, such as water, clear fruit juice, black coffee, and plain tea.  Eating and drinking restrictions Follow instructions from your health care provider about eating and drinking, which may include:  8 hours before the procedure - stop eating heavy meals or foods, such as meat, fried foods, or fatty foods.  6 hours before the procedure - stop eating light meals or foods, such as toast or cereal.  6 hours before the procedure - stop drinking milk or drinks that contain milk.  2 hours before the procedure - stop drinking clear liquids. Medicines Ask your health care provider about:  Changing or stopping your regular medicines. This is especially important if you are taking diabetes medicines or blood thinners.  Taking medicines such as aspirin and ibuprofen. These medicines can thin your blood. Do not take these medicines unless your health care provider tells you to take them.  Taking over-the-counter medicines, vitamins, herbs, and supplements. General instructions  Prior to surgery, your health care provider may do a procedure to locate and mark the tumor area in your breast (localization). This will help guide your surgeon to where the incision will be made. This may be done with: ? Imaging, such as a mammogram, ultrasound, or MRI. ? Insertion of a small wire, clip, or seed, or an implant that will reflect a radar signal.  You may have screening tests or exams to get baseline measurements of your arm. These  can be compared to measurements done after surgery to monitor for swelling (lymphedema) that can develop after having lymph nodes removed.  Ask your health care provider: ? How your surgery site will be marked. ? What steps will be taken to help prevent infection. These may include:  Washing skin with a germ-killing  soap.  Taking antibiotic medicine.  Plan to have someone take you home from the hospital or clinic.  Plan to have a responsible adult care for you for at least 24 hours after you leave the hospital or clinic. This is important. What happens during the procedure?   An IV will be inserted into one of your veins.  You will be given one or more of the following: ? A medicine to help you relax (sedative). ? A medicine to numb the area (local anesthetic). ? A medicine to make you fall asleep (general anesthetic).  Your health care provider will use a kind of electric scalpel that uses heat to reduce bleeding (electrocautery knife). A curved incision that follows the natural curve of your breast will be made. This type of incision will allow for minimal scarring and better healing.  The tumor will be removed along with some of the tissue around it. This will be sent to the lab for testing. Your health care provider may also remove lymph nodes at this time if needed.  If the tumor is close to the muscles over your chest, some muscle tissue may also be removed.  A small drain tube may be inserted into your breast area or armpit to collect fluid that may build up after surgery. This tube will be connected to a suction bulb on the outside of your body to remove the fluid.  The incision will be closed with stitches (sutures).  A bandage (dressing) may be placed over the incision. The procedure may vary among health care providers and hospitals. What happens after the procedure?  Your blood pressure, heart rate, breathing rate, and blood oxygen level will be monitored until you leave the hospital or clinic.  You will be given medicine for pain as needed.  Your IV will be removed when you are able to eat and drink by mouth.  You will be encouraged to get up and walk as soon as you can. This is important to improve blood flow and breathing. Ask for help if you feel weak or unsteady.  You may  have: ? A drain tube in place for 2-3 days to prevent a collection of blood (hematoma) from developing in the breast. You will be given instructions about caring for the drain before you go home. ? A pressure bandage applied for 1-2 days to prevent bleeding or swelling. Your pressure bandage may look like a thick piece of fabric or an elastic wrap. Ask your health care provider how to care for your bandage at home.  You may be given a tight sleeve to wear over your arm on the side of your surgery. You should wear this sleeve as told by your health care provider.  Do not drive for 24 hours if you were given a sedative during your procedure. Summary  A lumpectomy, sometimes called a partial mastectomy, is surgery to remove a cancerous tumor or mass (the lump) from a breast.  During a lumpectomy, the portion of the breast that contains the tumor is removed. Lymph nodes under your arm may also be removed and tested to find out if the cancer has spread.  Plan to  have someone take you home from the hospital or clinic.  You may have a drain tube in place for 2-3 days to prevent a collection of blood (hematoma) from developing in the breast. You will be given instructions about caring for the drain before you go home. This information is not intended to replace advice given to you by your health care provider. Make sure you discuss any questions you have with your health care provider. Document Revised: 08/12/2018 Document Reviewed: 08/12/2018 Elsevier Patient Education  [place].

## 2019-05-15 NOTE — H&P (View-Only) (Signed)
Patient ID: Nancy Patton, female   DOB: 23-May-1960, 59 y.o.   MRN: [id number]  Chief Complaint: Right breast carcinoma  History of Present Illness Nancy Patton Blazer is a 59 y.o. female with an abnormal mammogram on screening, leading to additional imaging, ultrasound-guided core biopsy.  Pathology confirmed to ER PR positivity and HER-2 negativity.  Prior hormonal history includes birth control pills.  She is postmenopausal.  She has no known family history of breast cancer.  She began menstruating at the age of 58 and has a history of 3 pregnancies with her first child born at her age of 69.  She did breast-feed.  She denies any known breast masses, skin changes or nipple discharge.  She does perform her own self-examinations.  Past Medical History Past Medical History:  Diagnosis Date  . Asthma   . Diabetes mellitus without complication ([place])   . Hypertension   . Obesity   . Pulmonary hypertension ([place])       Past Surgical History:  Procedure Laterality Date  . CATARACT EXTRACTION    . CESAREAN SECTION      No Known Allergies  Current Outpatient Medications  Medication Sig Dispense Refill  . albuterol (PROVENTIL HFA;VENTOLIN HFA) 108 (90 BASE) MCG/ACT inhaler Inhale 4-6 puffs by mouth every 4 hours as needed for wheezing, cough, and/or shortness of breath 1 Inhaler 1  . albuterol (PROVENTIL) (2.5 MG/3ML) 0.083% nebulizer solution Take 3 mLs (2.5 mg total) by nebulization every 6 (six) hours as needed for wheezing or shortness of breath. 75 mL 3  . BREO ELLIPTA 200-25 MCG/INH AEPB Inhale 1 puff into the lungs daily. 60 each 0  . cetirizine (ZYRTEC) 10 MG tablet Take by mouth.    . citalopram (CELEXA) 20 MG tablet Take 20 mg by mouth daily.  10  . ibuprofen (ADVIL,MOTRIN) 600 MG tablet Take 1 tablet (600 mg total) by mouth every 8 (eight) hours as needed. 30 tablet 0  . losartan-hydrochlorothiazide (HYZAAR) 50-12.5 MG per tablet Take 1 tablet by mouth daily.  5  .  metFORMIN (GLUCOPHAGE) 1000 MG tablet Take 1,000 mg by mouth 2 (two) times daily.  2  . montelukast (SINGULAIR) 10 MG tablet Take by mouth.    . traMADol (ULTRAM) 50 MG tablet Take 1 tablet (50 mg total) by mouth every 6 (six) hours as needed. 12 tablet 0  . glipiZIDE (GLIPIZIDE XL) 2.5 MG 24 hr tablet Take 1 tablet (2.5 mg total) by mouth daily with breakfast. (Patient not taking: Reported on 05/15/2019) 30 tablet 0  . penicillin v potassium (VEETID) 500 MG tablet Take 1 tablet (500 mg total) by mouth 4 (four) times daily. (Patient not taking: Reported on 05/15/2019) 28 tablet 0  . predniSONE (DELTASONE) 10 MG tablet Take 1 tablet (10 mg total) by mouth daily. Day 1-2: Take 50 mg  ( 5 pills) Day 3-4 : Take 40 mg (4pills) Day 5-6: Take 30 mg (3 pills) Day 7-8:  Take 20 mg (2 pills) Day 9:  Take 94m (1 pill) (Patient not taking: Reported on 05/15/2019) 29 tablet 0  . tiotropium (SPIRIVA) 18 MCG inhalation capsule Place 1 capsule (18 mcg total) into inhaler and inhale daily. (Patient not taking: Reported on 05/15/2019) 30 capsule 0   No current facility-administered medications for this visit.    Family History Family History  Problem Relation Age of Onset  . Cancer Neg Hx   . Diabetes Neg Hx   . Heart failure Neg Hx   .  Hypertension Neg Hx   . Breast cancer Neg Hx       Social History Social History   Tobacco Use  . Smoking status: Former Smoker    Types: Cigarettes  . Smokeless tobacco: Never Used  Substance Use Topics  . Alcohol use: No    Comment: 2nd hand smoke  . Drug use: No        Review of Systems  Constitutional: Positive for malaise/fatigue.  HENT: Negative.   Eyes: Negative.   Respiratory: Negative for cough, shortness of breath and wheezing.   Cardiovascular: Negative.   Gastrointestinal: Negative.   Genitourinary: Negative.   Musculoskeletal: Positive for joint pain.  Skin: Negative.   Neurological: Negative.   Endo/Heme/Allergies: Negative.       Physical  Exam Blood pressure 131/90, pulse 78, temperature (!) 96.2 F (35.7 C), temperature source Temporal, height '5\' 6"'  (1.676 m), weight 268 lb 6.4 oz (121.7 kg), SpO2 95 %. Last Weight  Most recent update: 05/15/2019 11:44 AM   Weight  121.7 kg (268 lb 6.4 oz)            CONSTITUTIONAL: Well developed, and nourished, appropriately responsive and aware without distress.   EYES: Sclera non-icteric.   EARS, NOSE, MOUTH AND THROAT: Mask worn.  Hearing is intact to voice.  NECK: Trachea is midline, and there is no jugular venous distension.  LYMPH NODES:  Lymph nodes in the neck are not enlarged. RESPIRATORY:  Lungs are clear, and breath sounds are equal bilaterally. Normal respiratory effort without pathologic use of accessory muscles. CARDIOVASCULAR: Heart is regular in rate and rhythm. GI: The abdomen is  soft, nontender, and nondistended.  GU: She presents with her Steri-Strips on her inferior lateral quadrant of the right breast, there are no suspicious or dominant nodularity or masses present.  There is no significant ecchymosis present.  Axillary exam negative for lymphadenopathy. MUSCULOSKELETAL:  Symmetrical muscle tone appreciated in all four extremities.    SKIN: Skin turgor is normal. No pathologic skin lesions appreciated.  NEUROLOGIC:  Motor and sensation appear grossly normal.  Cranial nerves are grossly without defect. PSYCH:  Alert and oriented to person, place and time. Affect is appropriate for situation.  Data Reviewed I have personally reviewed what is currently available of the patient's imaging, recent labs and medical records.   Labs:  CBC Latest Ref Rng & Units 04/07/2018 12/27/2015 08/26/2015  WBC 4.0 - 10.5 K/uL 6.8 6.2 11.0  Hemoglobin 12.0 - 15.0 g/dL 12.5 12.8 13.8  Hematocrit 36.0 - 46.0 % 38.1 37.5 41.0  Platelets 150 - 400 K/uL 259 199 207   CMP Latest Ref Rng & Units 04/07/2018 12/27/2015 08/26/2015  Glucose 70 - 99 mg/dL 138(H) 113(H) 214(H)  BUN 6 - 20 mg/dL 16 16  31(H)  Creatinine 0.44 - 1.00 mg/dL 0.90 0.78 0.97  Sodium 135 - 145 mmol/L 140 139 139  Potassium 3.5 - 5.1 mmol/L 4.0 4.3 3.8  Chloride 98 - 111 mmol/L 104 106 101  CO2 22 - 32 mmol/L '26 26 31  ' Calcium 8.9 - 10.3 mg/dL 8.9 9.0 9.1  Total Protein 6.5 - 8.1 g/dL - 7.0 -  Total Bilirubin 0.3 - 1.2 mg/dL - 0.4 -  Alkaline Phos 38 - 126 U/L - 70 -  AST 15 - 41 U/L - 26 -  ALT 14 - 54 U/L - 28 -   Surgical Pathology Report  Addendum    Reason for Addendum #1: Breast Biomarker Results   Specimen Submitted:  A. Breast, right 9:00 6cmfn   Clinical History: Heart-shaped biopsy clip within mass right breast  9:00, ultrasound-guided biopsy       DIAGNOSIS:  A. RIGHT BREAST, 9:00 6CMFN; ULTRASOUND-GUIDED BIOPSY:  - INVASIVE MAMMARY CARCINOMA, NO SPECIAL TYPE.   Size of invasive carcinoma: 7 mm in this sample  Histologic grade of invasive carcinoma: Grade 2            Glandular/tubular differentiation score: 3            Nuclear pleomorphism score: 2            Mitotic rate score: 1            Total score: 6  Ductal carcinoma in situ: Not identified  Lymphovascular invasion: Not identified   Comment: Immunohistochemistry will be performed on block A1, with reflex  to [place] for HER2 2+. The results will be reported in an addendum. The  definitive grade will be assigned on the excisional specimen.    GROSS DESCRIPTION:  A. Labeled: Right breast 9:00 6 cm from nipple  Received: Formalin  Time/date in fixative: Collected and placed into formalin at 1:22 PM on  05/07/2019.  Cold ischemic time: Less than 1 minute  Total fixation time: 7 hours  Core pieces: 4  Size: Ranging from 0.8-1.4 cm in length and 0.2 cm in diameter  Description: Received are needle core biopsy fragments of tan-yellow,  fibrofatty tissue  Ink color: Black  Entirely submitted in cassettes 1-2.   Final Diagnosis performed by Betsy Pries, MD.  Electronically  signed  05/08/2019 2:30:20PM  The electronic signature indicates that the named Attending Pathologist  has evaluated the specimen  Technical component performed at [facility], [address], [place],  [place] [address] Lab: [phone] Dir: Rush Farmer, MD, MMM  Professional component performed at [facility], [facility], [place], [place], [place] [address] Lab: [phone]  Dir: Dellia Nims. Rubinas, MD   ADDENDUM:  BREAST BIOMARKER TESTS  Estrogen Receptor (ER) Status: POSITIVE            Percentage of cells with nuclear positivity:  91-100%            Average intensity of staining: Strong   Progesterone Receptor (PgR) Status: POSITIVE            Percentage of cells with nuclear positivity: 1-10%            Average intensity of staining: Moderate   HER2 (by immunohistochemistry): NEGATIVE (Score 0)     Imaging: Radiology review: CLINICAL DATA:  Screening recall for right breast mass and possible left breast distortion.  EXAM: DIGITAL DIAGNOSTIC BILATERAL MAMMOGRAM WITH CAD AND TOMO  RIGHT BREAST ULTRASOUND  COMPARISON:  Previous exam(s).  ACR Breast Density Category b: There are scattered areas of fibroglandular density.  FINDINGS: Cc and MLO tomograms were performed of the right breast. There is an irregular spiculated mass within the outer right breast measuring approximately 0.8 cm.  Mammographic images were processed with CAD.  Targeted ultrasound of the outer right breast was performed. There is an irregular hypoechoic mass at the 9 o'clock position 6 cm from nipple measuring 0.6 x 0.6 x 1 cm. A nearby duct connects with the mass.  No lymphadenopathy seen in the right axilla.  IMPRESSION: Suspicious mass in the right breast at the 9 o'clock position.  RECOMMENDATION: Ultrasound-guided biopsy of the mass in the right breast at the 9 o'clock position is recommended. This will be  scheduled for the patient.  I have discussed the findings and recommendations with the patient. If applicable, a reminder letter will be sent to the patient regarding the next appointment.  BI-RADS CATEGORY  5: Highly suggestive of malignancy.   Electronically Signed   By: Everlean Alstrom M.D.   On: 04/29/2019 15:08   Within last 24 hrs: No results found.  Assessment    Right breast cancer, ER/PR positive, HER-2 negative.  Estimated image size 1 cm.  Ultrasound negative for axillary lymphadenopathy. Patient Active Problem List   Diagnosis Date Noted  . COPD exacerbation ([place]) 08/24/2015  . HTN (hypertension) 07/10/2015  . Type 2 diabetes mellitus ([place]) 07/10/2015  . Depression 07/10/2015  . Acute respiratory distress 09/26/2014  . Asthmatic bronchitis with acute exacerbation 09/26/2014  . Asthmatic bronchitis with exacerbation 09/26/2014    Plan    Right breast image localized lumpectomy, sentinel lymph node biopsy.  Breast discussion included surgical options, understanding the necessity of postoperative radiation for breast conservation.  I explained the role of sentinel lymph node biopsy and the potential for additional procedures depending upon results.  Anticipated treatment plan discussed, leaving way for increased stage and alternative medical options.  I believe she understands the risks involved in proceeding with surgery including anesthesia, bleeding, positive margins and need for additional surgery, scarring infection etc.  She is aware these are not all inclusive and desires to proceed.  No guarantees were ever expressed or implied.  Face-to-face time spent with the patient and accompanying care providers(if present) was 30 minutes, with more than 50% of the time spent counseling, educating, and coordinating care of the patient.      Ronny Bacon M.D., FACS 05/15/2019, 1:12 PM

## 2019-05-15 NOTE — Progress Notes (Signed)
Patient ID: Nancy Patton, female   DOB: [birth year], 59 y.o.   MRN: [id number]  Chief Complaint: Right breast carcinoma  History of Present Illness Nancy Patton is a 59 y.o. female with an abnormal mammogram on screening, leading to additional imaging, ultrasound-guided core biopsy.  Pathology confirmed to ER PR positivity and HER-2 negativity.  Prior hormonal history includes birth control pills.  She is postmenopausal.  She has no known family history of breast cancer.  She began menstruating at the age of 61 and has a history of 3 pregnancies with her first child born at her age of 62.  She did breast-feed.  She denies any known breast masses, skin changes or nipple discharge.  She does perform her own self-examinations.  Past Medical History Past Medical History:  Diagnosis Date  . Asthma   . Diabetes mellitus without complication ([place])   . Hypertension   . Obesity   . Pulmonary hypertension ([place])       Past Surgical History:  Procedure Laterality Date  . CATARACT EXTRACTION    . CESAREAN SECTION      No Known Allergies  Current Outpatient Medications  Medication Sig Dispense Refill  . albuterol (PROVENTIL HFA;VENTOLIN HFA) 108 (90 BASE) MCG/ACT inhaler Inhale 4-6 puffs by mouth every 4 hours as needed for wheezing, cough, and/or shortness of breath 1 Inhaler 1  . albuterol (PROVENTIL) (2.5 MG/3ML) 0.083% nebulizer solution Take 3 mLs (2.5 mg total) by nebulization every 6 (six) hours as needed for wheezing or shortness of breath. 75 mL 3  . BREO ELLIPTA 200-25 MCG/INH AEPB Inhale 1 puff into the lungs daily. 60 each 0  . cetirizine (ZYRTEC) 10 MG tablet Take by mouth.    . citalopram (CELEXA) 20 MG tablet Take 20 mg by mouth daily.  10  . ibuprofen (ADVIL,MOTRIN) 600 MG tablet Take 1 tablet (600 mg total) by mouth every 8 (eight) hours as needed. 30 tablet 0  . losartan-hydrochlorothiazide (HYZAAR) 50-12.5 MG per tablet Take 1 tablet by mouth daily.  5  .  metFORMIN (GLUCOPHAGE) 1000 MG tablet Take 1,000 mg by mouth 2 (two) times daily.  2  . montelukast (SINGULAIR) 10 MG tablet Take by mouth.    . traMADol (ULTRAM) 50 MG tablet Take 1 tablet (50 mg total) by mouth every 6 (six) hours as needed. 12 tablet 0  . glipiZIDE (GLIPIZIDE XL) 2.5 MG 24 hr tablet Take 1 tablet (2.5 mg total) by mouth daily with breakfast. (Patient not taking: Reported on 05/15/2019) 30 tablet 0  . penicillin v potassium (VEETID) 500 MG tablet Take 1 tablet (500 mg total) by mouth 4 (four) times daily. (Patient not taking: Reported on 05/15/2019) 28 tablet 0  . predniSONE (DELTASONE) 10 MG tablet Take 1 tablet (10 mg total) by mouth daily. Day 1-2: Take 50 mg  ( 5 pills) Day 3-4 : Take 40 mg (4pills) Day 5-6: Take 30 mg (3 pills) Day 7-8:  Take 20 mg (2 pills) Day 9:  Take 39m (1 pill) (Patient not taking: Reported on 05/15/2019) 29 tablet 0  . tiotropium (SPIRIVA) 18 MCG inhalation capsule Place 1 capsule (18 mcg total) into inhaler and inhale daily. (Patient not taking: Reported on 05/15/2019) 30 capsule 0   No current facility-administered medications for this visit.    Family History Family History  Problem Relation Age of Onset  . Cancer Neg Hx   . Diabetes Neg Hx   . Heart failure Neg Hx   .  Hypertension Neg Hx   . Breast cancer Neg Hx       Social History Social History   Tobacco Use  . Smoking status: Former Smoker    Types: Cigarettes  . Smokeless tobacco: Never Used  Substance Use Topics  . Alcohol use: No    Comment: 2nd hand smoke  . Drug use: No        Review of Systems  Constitutional: Positive for malaise/fatigue.  HENT: Negative.   Eyes: Negative.   Respiratory: Negative for cough, shortness of breath and wheezing.   Cardiovascular: Negative.   Gastrointestinal: Negative.   Genitourinary: Negative.   Musculoskeletal: Positive for joint pain.  Skin: Negative.   Neurological: Negative.   Endo/Heme/Allergies: Negative.       Physical  Exam Blood pressure 131/90, pulse 78, temperature (!) 96.2 F (35.7 C), temperature source Temporal, height '5\' 6"'  (1.676 m), weight 268 lb 6.4 oz (121.7 kg), SpO2 95 %. Last Weight  Most recent update: 05/15/2019 11:44 AM   Weight  121.7 kg (268 lb 6.4 oz)            CONSTITUTIONAL: Well developed, and nourished, appropriately responsive and aware without distress.   EYES: Sclera non-icteric.   EARS, NOSE, MOUTH AND THROAT: Mask worn.  Hearing is intact to voice.  NECK: Trachea is midline, and there is no jugular venous distension.  LYMPH NODES:  Lymph nodes in the neck are not enlarged. RESPIRATORY:  Lungs are clear, and breath sounds are equal bilaterally. Normal respiratory effort without pathologic use of accessory muscles. CARDIOVASCULAR: Heart is regular in rate and rhythm. GI: The abdomen is  soft, nontender, and nondistended.  GU: She presents with her Steri-Strips on her inferior lateral quadrant of the right breast, there are no suspicious or dominant nodularity or masses present.  There is no significant ecchymosis present.  Axillary exam negative for lymphadenopathy. MUSCULOSKELETAL:  Symmetrical muscle tone appreciated in all four extremities.    SKIN: Skin turgor is normal. No pathologic skin lesions appreciated.  NEUROLOGIC:  Motor and sensation appear grossly normal.  Cranial nerves are grossly without defect. PSYCH:  Alert and oriented to person, place and time. Affect is appropriate for situation.  Data Reviewed I have personally reviewed what is currently available of the patient's imaging, recent labs and medical records.   Labs:  CBC Latest Ref Rng & Units 04/07/2018 12/27/2015 08/26/2015  WBC 4.0 - 10.5 K/uL 6.8 6.2 11.0  Hemoglobin 12.0 - 15.0 g/dL 12.5 12.8 13.8  Hematocrit 36.0 - 46.0 % 38.1 37.5 41.0  Platelets 150 - 400 K/uL 259 199 207   CMP Latest Ref Rng & Units 04/07/2018 12/27/2015 08/26/2015  Glucose 70 - 99 mg/dL 138(H) 113(H) 214(H)  BUN 6 - 20 mg/dL 16 16  31(H)  Creatinine 0.44 - 1.00 mg/dL 0.90 0.78 0.97  Sodium 135 - 145 mmol/L 140 139 139  Potassium 3.5 - 5.1 mmol/L 4.0 4.3 3.8  Chloride 98 - 111 mmol/L 104 106 101  CO2 22 - 32 mmol/L '26 26 31  ' Calcium 8.9 - 10.3 mg/dL 8.9 9.0 9.1  Total Protein 6.5 - 8.1 g/dL - 7.0 -  Total Bilirubin 0.3 - 1.2 mg/dL - 0.4 -  Alkaline Phos 38 - 126 U/L - 70 -  AST 15 - 41 U/L - 26 -  ALT 14 - 54 U/L - 28 -   Surgical Pathology Report  Addendum    Reason for Addendum #1: Breast Biomarker Results   Specimen Submitted:  A. Breast, right 9:00 6cmfn   Clinical History: Heart-shaped biopsy clip within mass right breast  9:00, ultrasound-guided biopsy       DIAGNOSIS:  A. RIGHT BREAST, 9:00 6CMFN; ULTRASOUND-GUIDED BIOPSY:  - INVASIVE MAMMARY CARCINOMA, NO SPECIAL TYPE.   Size of invasive carcinoma: 7 mm in this sample  Histologic grade of invasive carcinoma: Grade 2            Glandular/tubular differentiation score: 3            Nuclear pleomorphism score: 2            Mitotic rate score: 1            Total score: 6  Ductal carcinoma in situ: Not identified  Lymphovascular invasion: Not identified   Comment: Immunohistochemistry will be performed on block A1, with reflex  to [place] for HER2 2+. The results will be reported in an addendum. The  definitive grade will be assigned on the excisional specimen.    GROSS DESCRIPTION:  A. Labeled: Right breast 9:00 6 cm from nipple  Received: Formalin  Time/date in fixative: Collected and placed into formalin at 1:22 PM on  05/07/2019.  Cold ischemic time: Less than 1 minute  Total fixation time: 7 hours  Core pieces: 4  Size: Ranging from 0.8-1.4 cm in length and 0.2 cm in diameter  Description: Received are needle core biopsy fragments of tan-yellow,  fibrofatty tissue  Ink color: Black  Entirely submitted in cassettes 1-2.   Final Diagnosis performed by Betsy Pries, MD.  Electronically  signed  05/08/2019 2:30:20PM  The electronic signature indicates that the named Attending Pathologist  has evaluated the specimen  Technical component performed at [facility], [address], [place],  [place] [address] Lab: [phone] Dir: Rush Farmer, MD, MMM  Professional component performed at [facility], [facility], [place], [place],  [address] Lab: [phone]  Dir: Dellia Nims. Rubinas, MD   ADDENDUM:  BREAST BIOMARKER TESTS  Estrogen Receptor (ER) Status: POSITIVE            Percentage of cells with nuclear positivity:  91-100%            Average intensity of staining: Strong   Progesterone Receptor (PgR) Status: POSITIVE            Percentage of cells with nuclear positivity: 1-10%            Average intensity of staining: Moderate   HER2 (by immunohistochemistry): NEGATIVE (Score 0)     Imaging: Radiology review: CLINICAL DATA:  Screening recall for right breast mass and possible left breast distortion.  EXAM: DIGITAL DIAGNOSTIC BILATERAL MAMMOGRAM WITH CAD AND TOMO  RIGHT BREAST ULTRASOUND  COMPARISON:  Previous exam(s).  ACR Breast Density Category b: There are scattered areas of fibroglandular density.  FINDINGS: Cc and MLO tomograms were performed of the right breast. There is an irregular spiculated mass within the outer right breast measuring approximately 0.8 cm.  Mammographic images were processed with CAD.  Targeted ultrasound of the outer right breast was performed. There is an irregular hypoechoic mass at the 9 o'clock position 6 cm from nipple measuring 0.6 x 0.6 x 1 cm. A nearby duct connects with the mass.  No lymphadenopathy seen in the right axilla.  IMPRESSION: Suspicious mass in the right breast at the 9 o'clock position.  RECOMMENDATION: Ultrasound-guided biopsy of the mass in the right breast at the 9 o'clock position is recommended. This will be  scheduled for the patient.  I have discussed the findings and recommendations with the patient. If applicable, a reminder letter will be sent to the patient regarding the next appointment.  BI-RADS CATEGORY  5: Highly suggestive of malignancy.   Electronically Signed   By: Everlean Alstrom M.D.   On: 04/29/2019 15:08   Within last 24 hrs: No results found.  Assessment    Right breast cancer, ER/PR positive, HER-2 negative.  Estimated image size 1 cm.  Ultrasound negative for axillary lymphadenopathy. Patient Active Problem List   Diagnosis Date Noted  . COPD exacerbation ([place]) 08/24/2015  . HTN (hypertension) 07/10/2015  . Type 2 diabetes mellitus ([place]) 07/10/2015  . Depression 07/10/2015  . Acute respiratory distress 09/26/2014  . Asthmatic bronchitis with acute exacerbation 09/26/2014  . Asthmatic bronchitis with exacerbation 09/26/2014    Plan    Right breast image localized lumpectomy, sentinel lymph node biopsy.  Breast discussion included surgical options, understanding the necessity of postoperative radiation for breast conservation.  I explained the role of sentinel lymph node biopsy and the potential for additional procedures depending upon results.  Anticipated treatment plan discussed, leaving way for increased stage and alternative medical options.  I believe she understands the risks involved in proceeding with surgery including anesthesia, bleeding, positive margins and need for additional surgery, scarring infection etc.  She is aware these are not all inclusive and desires to proceed.  No guarantees were ever expressed or implied.  Face-to-face time spent with the patient and accompanying care providers(if present) was 30 minutes, with more than 50% of the time spent counseling, educating, and coordinating care of the patient.      Ronny Bacon M.D., FACS 05/15/2019, 1:12 PM

## 2019-05-16 ENCOUNTER — Encounter: Payer: Self-pay | Admitting: Surgery

## 2019-05-19 ENCOUNTER — Other Ambulatory Visit: Payer: 59

## 2019-05-19 ENCOUNTER — Telehealth: Payer: Self-pay | Admitting: Surgery

## 2019-05-19 NOTE — Telephone Encounter (Signed)
Pt has been advised of Pre-Admission date/time, COVID Testing date and Surgery date.  Surgery Date: 05/23/19 Preadmission Testing Date: 05/20/19 (phone 8a-1p) Covid Testing Date: 05/21/19 - patient advised to go to the [facility] between 8a-1p  Patient has been made aware to call [phone], between 1-3:00pm the day before surgery, to find out what time to arrive for surgery.

## 2019-05-20 ENCOUNTER — Other Ambulatory Visit: Payer: Self-pay | Admitting: Surgery

## 2019-05-20 ENCOUNTER — Inpatient Hospital Stay: Payer: 59 | Attending: Oncology | Admitting: Oncology

## 2019-05-20 ENCOUNTER — Encounter
Admission: RE | Admit: 2019-05-20 | Discharge: 2019-05-20 | Disposition: A | Discharge status: Home / Self Care | Payer: 59 | Source: Ambulatory Visit | Attending: Surgery | Admitting: Surgery

## 2019-05-20 ENCOUNTER — Encounter: Payer: Self-pay | Admitting: Oncology

## 2019-05-20 ENCOUNTER — Encounter: Payer: Self-pay | Admitting: *Deleted

## 2019-05-20 ENCOUNTER — Other Ambulatory Visit: Payer: Self-pay

## 2019-05-20 ENCOUNTER — Other Ambulatory Visit: Payer: 59

## 2019-05-20 VITALS — BP 145/80 | HR 78 | Temp 96.5°F | Resp 16 | Ht 66.0 in | Wt 269.0 lb

## 2019-05-20 DIAGNOSIS — M1990 Unspecified osteoarthritis, unspecified site: Secondary | ICD-10-CM | POA: Insufficient documentation

## 2019-05-20 DIAGNOSIS — Z7189 Other specified counseling: Secondary | ICD-10-CM

## 2019-05-20 DIAGNOSIS — R5383 Other fatigue: Secondary | ICD-10-CM | POA: Insufficient documentation

## 2019-05-20 DIAGNOSIS — J449 Chronic obstructive pulmonary disease, unspecified: Secondary | ICD-10-CM | POA: Diagnosis not present

## 2019-05-20 DIAGNOSIS — C50911 Malignant neoplasm of unspecified site of right female breast: Secondary | ICD-10-CM

## 2019-05-20 DIAGNOSIS — C50011 Malignant neoplasm of nipple and areola, right female breast: Secondary | ICD-10-CM

## 2019-05-20 DIAGNOSIS — E785 Hyperlipidemia, unspecified: Secondary | ICD-10-CM | POA: Diagnosis not present

## 2019-05-20 DIAGNOSIS — I10 Essential (primary) hypertension: Secondary | ICD-10-CM | POA: Insufficient documentation

## 2019-05-20 DIAGNOSIS — Z170 Estrogen receptor positive status [ER+]: Secondary | ICD-10-CM | POA: Diagnosis not present

## 2019-05-20 DIAGNOSIS — R5381 Other malaise: Secondary | ICD-10-CM | POA: Diagnosis not present

## 2019-05-20 DIAGNOSIS — E119 Type 2 diabetes mellitus without complications: Secondary | ICD-10-CM | POA: Diagnosis not present

## 2019-05-20 DIAGNOSIS — I272 Pulmonary hypertension, unspecified: Secondary | ICD-10-CM | POA: Insufficient documentation

## 2019-05-20 DIAGNOSIS — G4730 Sleep apnea, unspecified: Secondary | ICD-10-CM | POA: Insufficient documentation

## 2019-05-20 DIAGNOSIS — Z79899 Other long term (current) drug therapy: Secondary | ICD-10-CM | POA: Diagnosis not present

## 2019-05-20 DIAGNOSIS — Z87891 Personal history of nicotine dependence: Secondary | ICD-10-CM | POA: Diagnosis not present

## 2019-05-20 DIAGNOSIS — Z01818 Encounter for other preprocedural examination: Secondary | ICD-10-CM | POA: Insufficient documentation

## 2019-05-20 DIAGNOSIS — E118 Type 2 diabetes mellitus with unspecified complications: Secondary | ICD-10-CM | POA: Insufficient documentation

## 2019-05-20 DIAGNOSIS — Z20822 Contact with and (suspected) exposure to covid-19: Secondary | ICD-10-CM | POA: Insufficient documentation

## 2019-05-20 HISTORY — DX: Unspecified osteoarthritis, unspecified site: M19.90

## 2019-05-20 HISTORY — DX: Sleep apnea, unspecified: G47.30

## 2019-05-20 NOTE — Patient Instructions (Signed)
Your procedure is scheduled on: Friday 05/23/19.   Report to [facility] at your specified appointment time. They will let you know what time to arrive.    Remember: Instructions that are not followed completely may result in serious medical risk, up to and including death, or upon the discretion of your surgeon and anesthesiologist your surgery may need to be rescheduled.      _X__ 1. Do not eat food after midnight the night before your procedure.                 No gum chewing or hard candies. You may drink SUGAR FREE clear liquids such as water or Gatorade Zero up to 2 hours                 before you are scheduled to arrive for your surgery- DO NOT drink clear liquids within 2 hours of the start of your surgery.                   __X__2.  On the morning of surgery brush your teeth with toothpaste and water, you may rinse your mouth with mouthwash if you wish.  Do not swallow any toothpaste or mouthwash.      _X__ 3.  No Alcohol for 24 hours before or after surgery.     _X__ 4.  Do Not Smoke or use e-cigarettes For 24 Hours Prior to Your Surgery.                 Do not use any chewable tobacco products for at least 6 hours prior to                 Surgery.   __X__5.  Notify your doctor if there is any change in your medical condition      (cold, fever, infections).      Do not wear jewelry, make-up, hairpins, clips or nail polish. Do not wear lotions, powders, or perfumes.  Do not shave 48 hours prior to surgery. Men may shave face and neck. Do not bring valuables to the hospital.     [facility] is not responsible for any belongings or valuables.   Contacts, dentures/partials or body piercings may not be worn into surgery. Bring a case for your contacts, glasses or hearing aids, a denture cup will be supplied.    Patients discharged the day of surgery will not be allowed to drive home.   __X__ Take these medicines the morning of surgery with A SIP OF  WATER:     1. albuterol (PROVENTIL HFA;VENTOLIN HFA)   2. budesonide-formoterol (SYMBICORT)  3. citalopram (CELEXA)  4. montelukast (SINGULAIR)      __X__ Use CHG Soap as directed    __ X__ Use inhalers on the day of surgery. Also bring the inhaler with you to the hospital on the morning of surgery.    __X__ Stop Metformin 2 days prior to surgery. Your last dose will be on Tuesday evening 05/20/19.       __X__ Stop Anti-inflammatories 7 days before surgery such as Advil, Ibuprofen, Motrin, BC or Goodies Powder, Naprosyn, Naproxen, Aleve, Aspirin, Meloxicam. May take Tylenol if needed for pain or discomfort.     __X__ Stop the following herbal supplements prior to your procedure:  Omega-3 Fatty Acids (FISH OIL)

## 2019-05-20 NOTE — Progress Notes (Signed)
Bowel loose for couple of weeks she thinks it is stress. New breast cancer and is scheduled for partial mastectomy next week

## 2019-05-20 NOTE — Research (Signed)
Met with patient Nancy Patton at Dr. Elroy Channel request to discuss the [place] UPBEAT clinical trial. Dr. Janese Banks has introduced the study to the patient and Nancy Patton expressed interest in learning more about the study.Reviewed purpose of the study as well as the events involved in study participation including Cardiac MRI's, Physical performance Battery, Neurocognitive assessments and Questionnaires along with the specific time points and purpose for each. Patient informed that participation is strictly voluntary and she was given a copy of the informed consent form to take home and review along with contact information for myself and Nancy Fruit, RN. Plans made to follow up with patient by phone in 2-3 days to determine whether she is interested in participating and to answer any questions she may have. Nancy Patton, BSN, MHA, OCN 05/20/2019 2:36 PM   T/C made to patient [name] to follow up her interest in the UPBEAT study. Patient reports her surgery has been scheduled for tomorrow and she has not yet had an opportunity to review the consent form for the UPBEAT study. Patient requests that I call her back early next week to review this information. Plans made to return call to patient next week. Nancy Patton, BSN, MHA, OCN 05/22/2019 1:43 PM   T/C made to patient Nancy Patton to determine her interest in the [place] UPBEAT study. Patient states she cannot find her copy of the study consent. Another copy of the consent form for this study was emailed to patient with her permission. Plans made to call her back next week to discuss the study. Nancy Patton, BSN, MHA, OCN 05/28/2019 2:38 PM    Met with patient in clinic today to discuss her decision about participating in the UPBEAT study. Patient states she is still undecided and concerned that it will interfere with her work schedule. Reviewed the 4 time-points when  Study assessments are due and the fact that each one will take  about 90 minutes to complete. Also reviewed that she will need to travel to [facility] at 3 time points: baseline, 3 months and 24 months for a cardiac MRI. Discussed that we are assessing her cardiac function, her physical function, quality of life and neurocognitive status at each time point, that she will be given a $25 gift card when she completes each of the 4 time-point activities and that we can also possibly provide a $10 gas card to offset the expense of traveling to [facility] for the cardiac MRIs. Patient states she is unsure whether she will be receiving chemotherapy at this time, or just hormone therapy. Instructed that she would be potentially eligible either way. Nancy Patton requests that I contact her next Tuesday for her final decision. Dr. Janese Banks was available for part of this discussion. Nancy Patton, BSN, MHA, OCN 06/06/2019 9:52 AM   T/C made to [name] as previously planned to determine whether she is interested in the UPBEAT study. Nancy Patton states that after meeting with the Radiation Oncologist and hearing that she will have to come in daily for her treatments, she feels the study would ne too much for her on top of that. States she is trying to work, and does not want to take on too much. Patient was thanked for considering the clinical trial. Will notify Dr. Janese Banks of the patient's decision and approach patient in clinic for [place] DCP-001 study in the near future. Nancy Patton, BSN, MHA, OCN 06/10/2019 2:40 PM

## 2019-05-20 NOTE — Progress Notes (Unsigned)
Met patient today during her initial medical oncology consult with dr. Rao.  Gave patient breast cancer educational literature, "My Breast Cancer Treatment Handbook" by Judy Neese, RN.  She is scheduled for surgery on 05/23/19.  She is to call with any questions or needs. 

## 2019-05-21 ENCOUNTER — Other Ambulatory Visit: Payer: No Typology Code available for payment source

## 2019-05-21 ENCOUNTER — Ambulatory Visit
Admission: RE | Admit: 2019-05-21 | Discharge: 2019-05-21 | Disposition: A | Discharge status: Home / Self Care | Payer: No Typology Code available for payment source | Source: Ambulatory Visit | Attending: Surgery | Admitting: Surgery

## 2019-05-21 ENCOUNTER — Encounter
Admission: RE | Admit: 2019-05-21 | Discharge: 2019-05-21 | Disposition: A | Discharge status: Home / Self Care | Payer: 59 | Source: Ambulatory Visit | Attending: Surgery | Admitting: Surgery

## 2019-05-21 DIAGNOSIS — C50011 Malignant neoplasm of nipple and areola, right female breast: Secondary | ICD-10-CM | POA: Diagnosis present

## 2019-05-21 DIAGNOSIS — Z170 Estrogen receptor positive status [ER+]: Secondary | ICD-10-CM | POA: Diagnosis present

## 2019-05-21 LAB — BASIC METABOLIC PANEL
Anion gap: 10 (ref 5–15)
BUN: 17 mg/dL (ref 6–20)
CO2: 28 mmol/L (ref 22–32)
Calcium: 9.1 mg/dL (ref 8.9–10.3)
Chloride: 103 mmol/L (ref 98–111)
Creatinine, Ser: 0.66 mg/dL (ref 0.44–1.00)
GFR calc Af Amer: >60 mL/min (ref >60)
GFR calc non Af Amer: >60 mL/min (ref >60)
Glucose, Bld: 118 mg/dL — ABNORMAL HIGH (ref 70–99)
Potassium: 4.2 mmol/L (ref 3.5–5.1)
Sodium: 141 mmol/L (ref 135–145)

## 2019-05-21 LAB — CBC
HCT: 39.6 % (ref 36.0–46.0)
Hemoglobin: 12.7 g/dL (ref 12.0–15.0)
MCH: 29 pg (ref 26.0–34.0)
MCHC: 32.1 g/dL (ref 30.0–36.0)
MCV: 90.4 fL (ref 80.0–100.0)
Platelets: 244 10*3/uL (ref 150–400)
RBC: 4.38 MIL/uL (ref 3.87–5.11)
RDW: 12.7 % (ref 11.5–15.5)
WBC: 6.1 10*3/uL (ref 4.0–10.5)
nRBC: 0 % (ref 0.0–0.2)

## 2019-05-21 LAB — SARS CORONAVIRUS 2 (TAT 6-24 HRS): SARS Coronavirus 2: NEGATIVE

## 2019-05-22 MED ORDER — DEXTROSE 5 % IV SOLN
3.0000 g | INTRAVENOUS | Status: AC
  Administered 2019-05-23: 3 g via INTRAVENOUS
  Filled 2019-05-22: qty 3

## 2019-05-23 ENCOUNTER — Ambulatory Visit: Payer: No Typology Code available for payment source | Admitting: Anesthesiology

## 2019-05-23 ENCOUNTER — Encounter (HOSPITAL_BASED_OUTPATIENT_CLINIC_OR_DEPARTMENT_OTHER)
Admission: RE | Admit: 2019-05-23 | Discharge: 2019-05-23 | Disposition: A | Discharge status: Home / Self Care | Payer: No Typology Code available for payment source | Source: Ambulatory Visit | Attending: Surgery | Admitting: Surgery

## 2019-05-23 ENCOUNTER — Encounter: Payer: Self-pay | Admitting: Oncology

## 2019-05-23 ENCOUNTER — Ambulatory Visit
Admission: RE | Admit: 2019-05-23 | Discharge: 2019-05-23 | Disposition: A | Discharge status: Home / Self Care | Payer: No Typology Code available for payment source | Source: Ambulatory Visit | Attending: Surgery | Admitting: Surgery

## 2019-05-23 ENCOUNTER — Encounter: Payer: Self-pay | Admitting: Surgery

## 2019-05-23 ENCOUNTER — Ambulatory Visit
Admission: RE | Admit: 2019-05-23 | Discharge: 2019-05-23 | Disposition: A | Discharge status: Home / Self Care | Payer: No Typology Code available for payment source | Attending: Surgery | Admitting: Surgery

## 2019-05-23 ENCOUNTER — Other Ambulatory Visit: Payer: Self-pay

## 2019-05-23 ENCOUNTER — Encounter
Admission: RE | Disposition: A | Discharge status: Home / Self Care | Payer: Self-pay | Source: Home / Self Care | Attending: Surgery

## 2019-05-23 DIAGNOSIS — E669 Obesity, unspecified: Secondary | ICD-10-CM | POA: Insufficient documentation

## 2019-05-23 DIAGNOSIS — Z7951 Long term (current) use of inhaled steroids: Secondary | ICD-10-CM | POA: Insufficient documentation

## 2019-05-23 DIAGNOSIS — J449 Chronic obstructive pulmonary disease, unspecified: Secondary | ICD-10-CM | POA: Insufficient documentation

## 2019-05-23 DIAGNOSIS — Z79899 Other long term (current) drug therapy: Secondary | ICD-10-CM | POA: Diagnosis not present

## 2019-05-23 DIAGNOSIS — Z7984 Long term (current) use of oral hypoglycemic drugs: Secondary | ICD-10-CM | POA: Diagnosis not present

## 2019-05-23 DIAGNOSIS — G4730 Sleep apnea, unspecified: Secondary | ICD-10-CM | POA: Insufficient documentation

## 2019-05-23 DIAGNOSIS — Z170 Estrogen receptor positive status [ER+]: Secondary | ICD-10-CM

## 2019-05-23 DIAGNOSIS — C50911 Malignant neoplasm of unspecified site of right female breast: Secondary | ICD-10-CM

## 2019-05-23 DIAGNOSIS — E119 Type 2 diabetes mellitus without complications: Secondary | ICD-10-CM | POA: Insufficient documentation

## 2019-05-23 DIAGNOSIS — C50011 Malignant neoplasm of nipple and areola, right female breast: Secondary | ICD-10-CM

## 2019-05-23 DIAGNOSIS — Z6841 Body Mass Index (BMI) 40.0 and over, adult: Secondary | ICD-10-CM | POA: Diagnosis not present

## 2019-05-23 DIAGNOSIS — N6315 Unspecified lump in the right breast, overlapping quadrants: Secondary | ICD-10-CM | POA: Diagnosis present

## 2019-05-23 DIAGNOSIS — Z87891 Personal history of nicotine dependence: Secondary | ICD-10-CM | POA: Insufficient documentation

## 2019-05-23 DIAGNOSIS — I10 Essential (primary) hypertension: Secondary | ICD-10-CM | POA: Insufficient documentation

## 2019-05-23 DIAGNOSIS — C50811 Malignant neoplasm of overlapping sites of right female breast: Secondary | ICD-10-CM | POA: Diagnosis not present

## 2019-05-23 HISTORY — PX: BREAST LUMPECTOMY: SHX2

## 2019-05-23 LAB — GLUCOSE, CAPILLARY
Glucose-Capillary: 140 mg/dL — ABNORMAL HIGH (ref 70–99)
Glucose-Capillary: 167 mg/dL — ABNORMAL HIGH (ref 70–99)

## 2019-05-23 SURGERY — PART MASTECTOMY,RADIO FREQUENCY LOCALIZER,AXILLARY SENTINEL NODE BIOPSY
Anesthesia: General | Laterality: Right

## 2019-05-23 MED ORDER — MIDAZOLAM HCL 2 MG/2ML IJ SOLN
INTRAMUSCULAR | Status: AC
  Filled 2019-05-23: qty 2

## 2019-05-23 MED ORDER — LACTATED RINGERS IV SOLN: INTRAVENOUS | Status: DC | PRN

## 2019-05-23 MED ORDER — OXYCODONE HCL 5 MG/5ML PO SOLN: 5.0000 mg | Freq: Once | ORAL | Status: AC | PRN

## 2019-05-23 MED ORDER — FENTANYL CITRATE (PF) 100 MCG/2ML IJ SOLN
INTRAMUSCULAR | Status: AC
  Filled 2019-05-23: qty 2

## 2019-05-23 MED ORDER — BUPIVACAINE LIPOSOME 1.3 % IJ SUSP: 20.0000 mL | Freq: Once | INTRAMUSCULAR | Status: DC

## 2019-05-23 MED ORDER — GLYCOPYRROLATE 0.2 MG/ML IJ SOLN
INTRAMUSCULAR | Status: AC
  Filled 2019-05-23: qty 1

## 2019-05-23 MED ORDER — PHENYLEPHRINE HCL (PRESSORS) 10 MG/ML IV SOLN
INTRAVENOUS | Status: AC
  Filled 2019-05-23: qty 1

## 2019-05-23 MED ORDER — LIDOCAINE HCL (PF) 2 % IJ SOLN
INTRAMUSCULAR | Status: AC
  Filled 2019-05-23: qty 5

## 2019-05-23 MED ORDER — CHLORHEXIDINE GLUCONATE CLOTH 2 % EX PADS: 6.0000 | MEDICATED_PAD | Freq: Once | CUTANEOUS | Status: DC

## 2019-05-23 MED ORDER — GABAPENTIN 300 MG PO CAPS
ORAL_CAPSULE | ORAL | Status: AC
  Filled 2019-05-23: qty 1

## 2019-05-23 MED ORDER — EPHEDRINE SULFATE 50 MG/ML IJ SOLN
INTRAMUSCULAR | Status: DC | PRN
  Administered 2019-05-23 (×2): 10 mg via INTRAVENOUS
  Administered 2019-05-23: 5 mg via INTRAVENOUS
  Administered 2019-05-23: 10 mg via INTRAVENOUS

## 2019-05-23 MED ORDER — GLYCOPYRROLATE 0.2 MG/ML IJ SOLN
INTRAMUSCULAR | Status: AC
  Filled 2019-05-23: qty 2

## 2019-05-23 MED ORDER — PROPOFOL 10 MG/ML IV BOLUS
INTRAVENOUS | Status: AC
  Filled 2019-05-23: qty 20

## 2019-05-23 MED ORDER — ONDANSETRON HCL 4 MG/2ML IJ SOLN
INTRAMUSCULAR | Status: AC
  Filled 2019-05-23: qty 2

## 2019-05-23 MED ORDER — FENTANYL CITRATE (PF) 100 MCG/2ML IJ SOLN: 25.0000 ug | INTRAMUSCULAR | Status: DC | PRN

## 2019-05-23 MED ORDER — EPINEPHRINE PF 1 MG/ML IJ SOLN
INTRAMUSCULAR | Status: AC
  Filled 2019-05-23: qty 1

## 2019-05-23 MED ORDER — NEOSTIGMINE METHYLSULFATE 10 MG/10ML IV SOLN
INTRAVENOUS | Status: DC | PRN
  Administered 2019-05-23: 4 mg via INTRAVENOUS

## 2019-05-23 MED ORDER — EPHEDRINE 5 MG/ML INJ
INTRAVENOUS | Status: AC
  Filled 2019-05-23: qty 10

## 2019-05-23 MED ORDER — LIDOCAINE HCL (CARDIAC) PF 100 MG/5ML IV SOSY
PREFILLED_SYRINGE | INTRAVENOUS | Status: DC | PRN
  Administered 2019-05-23: 80 mg via INTRAVENOUS

## 2019-05-23 MED ORDER — HYDROCODONE-ACETAMINOPHEN 5-325 MG PO TABS: 1.0000 | ORAL_TABLET | Freq: Four times a day (QID) | ORAL | 0 refills | Status: DC | PRN

## 2019-05-23 MED ORDER — BUPIVACAINE-EPINEPHRINE 0.25% -1:200000 IJ SOLN
INTRAMUSCULAR | Status: DC | PRN
  Administered 2019-05-23: 20 mL

## 2019-05-23 MED ORDER — TECHNETIUM TC 99M SULFUR COLLOID FILTERED
0.6910 | Freq: Once | INTRAVENOUS | Status: AC | PRN
  Administered 2019-05-23: 0.691 via INTRADERMAL

## 2019-05-23 MED ORDER — OXYCODONE HCL 5 MG PO TABS
ORAL_TABLET | ORAL | Status: AC
  Filled 2019-05-23: qty 1

## 2019-05-23 MED ORDER — ACETAMINOPHEN 500 MG PO TABS
1000.0000 mg | ORAL_TABLET | ORAL | Status: AC
  Administered 2019-05-23: 09:00:00 1000 mg via ORAL

## 2019-05-23 MED ORDER — SODIUM CHLORIDE 0.9 % IV SOLN: INTRAVENOUS | Status: DC

## 2019-05-23 MED ORDER — FAMOTIDINE 20 MG PO TABS
20.0000 mg | ORAL_TABLET | Freq: Once | ORAL | Status: AC
  Administered 2019-05-23: 20 mg via ORAL

## 2019-05-23 MED ORDER — IBUPROFEN 800 MG PO TABS: 800.0000 mg | ORAL_TABLET | Freq: Three times a day (TID) | ORAL | 0 refills | Status: DC | PRN

## 2019-05-23 MED ORDER — MIDAZOLAM HCL 2 MG/2ML IJ SOLN
INTRAMUSCULAR | Status: DC | PRN
  Administered 2019-05-23: 2 mg via INTRAVENOUS

## 2019-05-23 MED ORDER — GLYCOPYRROLATE 0.2 MG/ML IJ SOLN
INTRAMUSCULAR | Status: DC | PRN
  Administered 2019-05-23: .6 mg via INTRAVENOUS

## 2019-05-23 MED ORDER — DEXAMETHASONE SODIUM PHOSPHATE 10 MG/ML IJ SOLN
INTRAMUSCULAR | Status: DC | PRN
  Administered 2019-05-23: 5 mg via INTRAVENOUS

## 2019-05-23 MED ORDER — NEOSTIGMINE METHYLSULFATE 10 MG/10ML IV SOLN
INTRAVENOUS | Status: AC
  Filled 2019-05-23: qty 1

## 2019-05-23 MED ORDER — ROCURONIUM BROMIDE 100 MG/10ML IV SOLN
INTRAVENOUS | Status: DC | PRN
  Administered 2019-05-23: 20 mg via INTRAVENOUS
  Administered 2019-05-23: 50 mg via INTRAVENOUS

## 2019-05-23 MED ORDER — ONDANSETRON HCL 4 MG/2ML IJ SOLN
INTRAMUSCULAR | Status: DC | PRN
  Administered 2019-05-23: 4 mg via INTRAVENOUS

## 2019-05-23 MED ORDER — OXYCODONE HCL 5 MG PO TABS
5.0000 mg | ORAL_TABLET | Freq: Once | ORAL | Status: AC | PRN
  Administered 2019-05-23: 5 mg via ORAL

## 2019-05-23 MED ORDER — ROCURONIUM BROMIDE 10 MG/ML (PF) SYRINGE
PREFILLED_SYRINGE | INTRAVENOUS | Status: AC
  Filled 2019-05-23: qty 10

## 2019-05-23 MED ORDER — PROPOFOL 10 MG/ML IV BOLUS
INTRAVENOUS | Status: DC | PRN
  Administered 2019-05-23: 150 mg via INTRAVENOUS

## 2019-05-23 MED ORDER — ACETAMINOPHEN 500 MG PO TABS
ORAL_TABLET | ORAL | Status: AC
  Filled 2019-05-23: qty 2

## 2019-05-23 MED ORDER — FAMOTIDINE 20 MG PO TABS
ORAL_TABLET | ORAL | Status: AC
  Filled 2019-05-23: qty 1

## 2019-05-23 MED ORDER — ISOSULFAN BLUE 1 % ~~LOC~~ SOLN
SUBCUTANEOUS | Status: DC | PRN
  Administered 2019-05-23: 4 mL via SUBCUTANEOUS

## 2019-05-23 MED ORDER — GABAPENTIN 300 MG PO CAPS
300.0000 mg | ORAL_CAPSULE | ORAL | Status: AC
  Administered 2019-05-23: 300 mg via ORAL

## 2019-05-23 MED ORDER — FENTANYL CITRATE (PF) 100 MCG/2ML IJ SOLN
INTRAMUSCULAR | Status: DC | PRN
  Administered 2019-05-23 (×2): 50 ug via INTRAVENOUS

## 2019-05-23 MED ORDER — ISOSULFAN BLUE 1 % ~~LOC~~ SOLN
SUBCUTANEOUS | Status: AC
  Filled 2019-05-23: qty 5

## 2019-05-23 SURGICAL SUPPLY — 41 items
ADH SKN CLS APL DERMABOND .7 (GAUZE/BANDAGES/DRESSINGS) ×1
APL PRP STRL LF DISP 70% ISPRP (MISCELLANEOUS) ×1
APPLIER CLIP 11 MED OPEN (CLIP) ×3
APR CLP MED 11 20 MLT OPN (CLIP) ×1
BLADE SURG 15 STRL LF DISP TIS (BLADE) ×1 IMPLANT
BLADE SURG 15 STRL SS (BLADE) ×3
CANISTER SUCT 1200ML W/VALVE (MISCELLANEOUS) ×3 IMPLANT
CHLORAPREP W/TINT 26 (MISCELLANEOUS) ×3 IMPLANT
CLIP APPLIE 11 MED OPEN (CLIP) IMPLANT
CNTNR SPEC 2.5X3XGRAD LEK (MISCELLANEOUS) ×1
CONT SPEC 4OZ STER OR WHT (MISCELLANEOUS) ×2
CONT SPEC 4OZ STRL OR WHT (MISCELLANEOUS) ×1
CONTAINER SPEC 2.5X3XGRAD LEK (MISCELLANEOUS) IMPLANT
COVER WAND RF STERILE (DRAPES) ×3 IMPLANT
DECANTER SPIKE VIAL GLASS SM (MISCELLANEOUS) ×1 IMPLANT
DERMABOND ADVANCED (GAUZE/BANDAGES/DRESSINGS) ×2
DERMABOND ADVANCED .7 DNX12 (GAUZE/BANDAGES/DRESSINGS) ×1 IMPLANT
DEVICE DUBIN SPECIMEN MAMMOGRA (MISCELLANEOUS) ×3 IMPLANT
DRAPE LAPAROTOMY TRNSV 106X77 (MISCELLANEOUS) ×3 IMPLANT
ELECT CAUTERY BLADE TIP 2.5 (TIP) ×3
ELECT REM PT RETURN 9FT ADLT (ELECTROSURGICAL) ×3
ELECTRODE CAUTERY BLDE TIP 2.5 (TIP) ×1 IMPLANT
ELECTRODE REM PT RTRN 9FT ADLT (ELECTROSURGICAL) ×1 IMPLANT
GLOVE ORTHO TXT STRL SZ7.5 (GLOVE) ×3 IMPLANT
GOWN STRL REUS W/ TWL LRG LVL3 (GOWN DISPOSABLE) ×2 IMPLANT
GOWN STRL REUS W/TWL LRG LVL3 (GOWN DISPOSABLE) ×6
KIT MARKER MARGIN INK (KITS) ×2 IMPLANT
KIT TURNOVER KIT A (KITS) ×3 IMPLANT
MARKER MARGIN CORRECT CLIP (MARKER) IMPLANT
NEEDLE HYPO 22GX1.5 SAFETY (NEEDLE) ×3 IMPLANT
PACK BASIN MINOR ARMC (MISCELLANEOUS) ×3 IMPLANT
SET LOCALIZER 20 PROBE US (MISCELLANEOUS) ×3 IMPLANT
SET WALTER ACTIVATION W/DRAPE (SET/KITS/TRAYS/PACK) ×4 IMPLANT
SLEVE PROBE SENORX GAMMA FIND (MISCELLANEOUS) ×3 IMPLANT
SUT MNCRL 4-0 (SUTURE) ×6
SUT MNCRL 4-0 27XMFL (SUTURE) ×2
SUT VIC AB 3-0 SH 27 (SUTURE) ×6
SUT VIC AB 3-0 SH 27X BRD (SUTURE) ×1 IMPLANT
SUTURE MNCRL 4-0 27XMF (SUTURE) ×1 IMPLANT
SYR 10ML LL (SYRINGE) ×3 IMPLANT
WATER STERILE IRR 1000ML POUR (IV SOLUTION) ×3 IMPLANT

## 2019-05-23 NOTE — Anesthesia Postprocedure Evaluation (Signed)
Anesthesia Post Note  Patient: [place]  Procedure(s) Performed: PARTIAL MASTECTOMY WITH RADIO FREQUENCY LOCALIZER AND AXILLARY SENTINEL LYMPH NODE BIOPSY (Right )  Patient location during evaluation: PACU Anesthesia Type: General Level of consciousness: awake and alert Pain management: pain level controlled Vital Signs Assessment: post-procedure vital signs reviewed and stable Respiratory status: spontaneous breathing, nonlabored ventilation, respiratory function stable and patient connected to nasal cannula oxygen Cardiovascular status: blood pressure returned to baseline and stable Postop Assessment: no apparent nausea or vomiting Anesthetic complications: no     Last Vitals:  Vitals:   05/23/19 1415 05/23/19 1421  BP:    Pulse:  85  Resp:  14  Temp:  36.4 C  SpO2: 96% 98%    Last Pain:  Vitals:   05/23/19 1421  TempSrc:   PainSc: 4                  Efrain Clauson K Jesicca Dipierro

## 2019-05-23 NOTE — Discharge Instructions (Signed)
AMBULATORY SURGERY  °DISCHARGE INSTRUCTIONS ° ° °1) The drugs that you were given will stay in your system until tomorrow so for the next 24 hours you should not: ° °A) Drive an automobile °B) Make any legal decisions °C) Drink any alcoholic beverage ° ° °2) You may resume regular meals tomorrow.  Today it is better to start with liquids and gradually work up to solid foods. ° °You may eat anything you prefer, but it is better to start with liquids, then soup and crackers, and gradually work up to solid foods. ° ° °3) Please notify your doctor immediately if you have any unusual bleeding, trouble breathing, redness and pain at the surgery site, drainage, fever, or pain not relieved by medication. ° ° ° °4) Additional Instructions: ° ° ° ° ° ° ° °Please contact your physician with any problems or Same Day Surgery at [phone], Monday through Friday 6 am to 4 pm, or [place] at [place] Main number at [phone]. °

## 2019-05-23 NOTE — Anesthesia Procedure Notes (Signed)
Procedure Name: Intubation Date/Time: 05/23/2019 12:01 PM Performed by: Letitia Neri, CRNA Pre-anesthesia Checklist: Patient identified, Patient being monitored, Timeout performed, Emergency Drugs available and Suction available Patient Re-evaluated:Patient Re-evaluated prior to induction Oxygen Delivery Method: Circle system utilized Preoxygenation: Pre-oxygenation with 100% oxygen Induction Type: IV induction Ventilation: Mask ventilation without difficulty Laryngoscope Size: Mac and 3 Grade View: Grade I Tube type: Oral Tube size: 7.0 mm Number of attempts: 1 Placement Confirmation: ETT inserted through vocal cords under direct vision,  positive ETCO2 and breath sounds checked- equal and bilateral Secured at: 21 cm Tube secured with: Tape Dental Injury: Teeth and Oropharynx as per pre-operative assessment

## 2019-05-23 NOTE — Anesthesia Preprocedure Evaluation (Signed)
Anesthesia Evaluation  Patient identified by MRN, date of birth, ID band Patient awake    Reviewed: Allergy & Precautions, H&P , NPO status , Patient's Chart, lab work & pertinent test results  History of Anesthesia Complications Negative for: history of anesthetic complications  Airway Mallampati: III  TM Distance: <3 FB Neck ROM: full    Dental  (+) Chipped   Pulmonary asthma , sleep apnea and Continuous Positive Airway Pressure Ventilation , COPD, former smoker,           Cardiovascular Exercise Tolerance: Good hypertension,      Neuro/Psych PSYCHIATRIC DISORDERS negative neurological ROS     GI/Hepatic negative GI ROS, Neg liver ROS, neg GERD  ,  Endo/Other  diabetes, Type 2  Renal/GU      Musculoskeletal  (+) Arthritis ,   Abdominal   Peds  Hematology negative hematology ROS (+)   Anesthesia Other Findings Past Medical History: No date: Arthritis No date: Asthma No date: Breast cancer (HCC) No date: Diabetes mellitus without complication (HCC) No date: Hypertension No date: Obesity No date: Pulmonary hypertension (HCC) No date: Sleep apnea  Past Surgical History: No date: CATARACT EXTRACTION No date: CESAREAN SECTION     Comment:  x 3   BMI    Body Mass Index: 43.41 kg/m      Reproductive/Obstetrics negative OB ROS                             Anesthesia Physical Anesthesia Plan  ASA: III  Anesthesia Plan: General ETT   Post-op Pain Management:    Induction: Intravenous  PONV Risk Score and Plan: Ondansetron, Dexamethasone, Midazolam and Treatment may vary due to age or medical condition  Airway Management Planned: Oral ETT  Additional Equipment:   Intra-op Plan:   Post-operative Plan: Extubation in OR  Informed Consent: I have reviewed the patients History and Physical, chart, labs and discussed the procedure including the risks, benefits and  alternatives for the proposed anesthesia with the patient or authorized representative who has indicated his/her understanding and acceptance.     Dental Advisory Given  Plan Discussed with: Anesthesiologist, CRNA and Surgeon  Anesthesia Plan Comments: (Patient consented for risks of anesthesia including but not limited to:  - adverse reactions to medications - damage to teeth, lips or other oral mucosa - sore throat or hoarseness - Damage to heart, brain, lungs or loss of life  Patient voiced understanding.)        Anesthesia Quick Evaluation

## 2019-05-23 NOTE — Transfer of Care (Signed)
Immediate Anesthesia Transfer of Care Note  Patient: [place]  Procedure(s) Performed: PARTIAL MASTECTOMY WITH RADIO FREQUENCY LOCALIZER AND AXILLARY SENTINEL LYMPH NODE BIOPSY (Right )  Patient Location: PACU  Anesthesia Type:General  Level of Consciousness: sedated  Airway & Oxygen Therapy: Patient Spontanous Breathing  Post-op Assessment: Report given to RN and Post -op Vital signs reviewed and stable  Post vital signs: Reviewed and stable  Last Vitals:  Vitals Value Taken Time  BP 147/66 05/23/19 1343  Temp    Pulse 84 05/23/19 1348  Resp 18 05/23/19 1348  SpO2 97 % 05/23/19 1348  Vitals shown include unvalidated device data.  Last Pain:  Vitals:   05/23/19 0849  TempSrc: Temporal  PainSc: 0-No pain         Complications: No apparent anesthesia complications

## 2019-05-23 NOTE — Op Note (Signed)
  Procedure Date:  05/23/2019  Pre-operative Diagnosis: Right breast cancer  Post-operative Diagnosis: Same  Procedure: RF ID tagged localized right breast lumpectomy and sentinel lymph node biopsy  Surgeon: Ronny Bacon, M.D., [facility]  Anesthesia:  General endotracheal  Estimated Blood Loss: 10 ml  Specimens: Right breast tissue from outer lower quadrant.  Single sentinel lymph node, both blue with radioactivity.   Complications: None  Indications for Procedure:  This is a 59 y.o. female who presents with invasive mammary carcinoma right breast.  The risks of bleeding, infection, injury to surrounding structures, hematoma, seroma, open wound, cosmetic deformity, and the need for further surgery were all discussed with the patient and was willing to proceed.  Prior to this procedure, the patient had undergone sentinel lymphoscintigraphy.  Description of Procedure: The patient was correctly identified in the preoperative area and brought into the operating room.  The patient was placed supine with VTE prophylaxis in place.  Appropriate time-outs were performed.  Anesthesia was induced and the patient was intubated.  Appropriate antibiotics were infused.  A visual dye was injected in the right periareolar region under aseptic conditions, massage administered for 5 minutes. The right chest and axilla were prepped and draped in usual sterile fashion.  Then using the hand-held probe an area of high counts was identified in the axilla, and a 5 cm incision was made as a curved infra-axillary incision.  Cautery was used to dissect down the subcutaneous tissue and the hand-held probe was used to guide dissection. A blue and hot lymph node was identified and resected.  This had a count of 1600 max.  Background counts did not approach a 10% of the highest in vivo count for the sentinel lymph node.  The cavity was irrigated and hemostasis was assured with electrocautery.  Local anesthetic was infiltrated  into the skin and subcutaneous tissue of the cavity.    An incision was made over the outer lower quadrant right breast, as defined by the localizer revealing the shortest distance with attention to the lesion of which we are concerned.  I then used the localizer to approximate the lesion, to [facility] retractors with [employer] retraction were utilized to stabilize the breast tissue, and a cube of breast tissue was excised by defining the perimeter with the localizer.  Faxitron was utilized to image the specimen after it was painted for orientation.  The cavity was then irrigated and hemostasis was assured with electrocautery.  The incision was then closed in two layers with 3-0 Vicryl and 4-0 Monocryl and sealed with DermaBond.  The wound was dressed with fluff gauze and a surgical bra was placed.  The patient was emerged from anesthesia and extubated and brought to the recovery room for further management.  The patient tolerated the procedure well and all counts were correct at the end of the case.   Ronny Bacon M.D., FACS 05/23/2019, 1:36 PM

## 2019-05-23 NOTE — Progress Notes (Signed)
Hematology/Oncology Consult note [facility] Telephone:([phone] [phone]) [phone]  Patient Care Team: Perrin Maltese, MD as PCP - General (Internal Medicine)   Name of the patient: Nancy Patton  [id number]  1960-04-19    Reason for referral-new diagnosis of breast cancer   Referring physician-Dr. Dimas Aguas  Date of visit: 05/23/19   History of presenting illness- Patient is a 59 year old female who recently underwent a screening mammogram which showed a possible mass in the right breast.  This was followed by diagnostic mammogram and ultrasound which showed an irregular related mass 0.6 x 0.6 x 1 cm at the 9 o'clock position 6 cm from the nipple.  This was biopsied and was consistent with invasive mammary carcinoma 7 mm grade ER 91 200% positive PR 1 to 10% positive and HER-2 negative.  Patient has seen Dr. Christian Mate and plans to get a lumpectomy soon.  She has a remote use of birth control pills.  She is postmenopausal.  Menarche at the age of 30.  G3 P3 L3.  No known prior breast biopsies.  No family history of breast cancer, colon gastric or pancreatic cancer.  Patient is currently doing well and denies any complaints at this time other than mild fatigue  ECOG PS- 1  Pain scale- 0   Review of systems- Review of Systems  Constitutional: Positive for malaise/fatigue. Negative for chills, fever and weight loss.  HENT: Negative for congestion, ear discharge and nosebleeds.   Eyes: Negative for blurred vision.  Respiratory: Negative for cough, hemoptysis, sputum production, shortness of breath and wheezing.   Cardiovascular: Negative for chest pain, palpitations, orthopnea and claudication.  Gastrointestinal: Negative for abdominal pain, blood in stool, constipation, diarrhea, heartburn, melena, nausea and vomiting.  Genitourinary: Negative for dysuria, flank pain, frequency, hematuria and urgency.  Musculoskeletal: Negative for back pain, joint  pain and myalgias.  Skin: Negative for rash.  Neurological: Negative for dizziness, tingling, focal weakness, seizures, weakness and headaches.  Endo/Heme/Allergies: Does not bruise/bleed easily.  Psychiatric/Behavioral: Negative for depression and suicidal ideas. The patient does not have insomnia.     No Known Allergies  Patient Active Problem List   Diagnosis Date Noted  . Cancer of right breast, stage 1, estrogen receptor positive ([place]) 05/15/2019  . COPD exacerbation ([place]) 08/24/2015  . HTN (hypertension) 07/10/2015  . Type 2 diabetes mellitus ([place]) 07/10/2015  . Depression 07/10/2015  . Acute respiratory distress 09/26/2014  . Asthmatic bronchitis with acute exacerbation 09/26/2014  . Asthmatic bronchitis with exacerbation 09/26/2014     Past Medical History:  Diagnosis Date  . Arthritis   . Asthma   . Breast cancer ([place])   . Diabetes mellitus without complication ([place])   . Hypertension   . Obesity   . Pulmonary hypertension ([place])   . Sleep apnea      Past Surgical History:  Procedure Laterality Date  . CATARACT EXTRACTION    . CESAREAN SECTION     x 3     Social History   Socioeconomic History  . Marital status: Single    Spouse name: Not on file  . Number of children: Not on file  . Years of education: Not on file  . Highest education level: Not on file  Occupational History  . Not on file  Tobacco Use  . Smoking status: Former Smoker    Types: Cigarettes  . Smokeless tobacco: Never Used  . Tobacco comment: Quit smoking 30 years ago.  Substance and Sexual Activity  .  Alcohol use: No  . Drug use: No  . Sexual activity: Not Currently  Other Topics Concern  . Not on file  Social History Narrative  . Not on file   Social Determinants of Health   Financial Resource Strain:   . Difficulty of Paying Living Expenses:   Food Insecurity:   . Worried About Charity fundraiser in the Last Year:   . Arboriculturist in the Last Year:   Transportation  Needs:   . Film/video editor (Medical):   Marland Kitchen Lack of Transportation (Non-Medical):   Physical Activity:   . Days of Exercise per Week:   . Minutes of Exercise per Session:   Stress:   . Feeling of Stress :   Social Connections:   . Frequency of Communication with Friends and Family:   . Frequency of Social Gatherings with Friends and Family:   . Attends Religious Services:   . Active Member of Clubs or Organizations:   . Attends Archivist Meetings:   Marland Kitchen Marital Status:   Intimate Partner Violence:   . Fear of Current or Ex-Partner:   . Emotionally Abused:   Marland Kitchen Physically Abused:   . Sexually Abused:      Family History  Problem Relation Age of Onset  . Diabetes Mother   . Stroke Mother   . Diabetes Father   . Diabetes Brother   . Cancer Neg Hx   . Heart failure Neg Hx   . Hypertension Neg Hx   . Breast cancer Neg Hx     No current facility-administered medications for this visit. No current outpatient medications on file.  Facility-Administered Medications Ordered in Other Visits:  .  ceFAZolin (ANCEF) 3 g in dextrose 5 % 50 mL IVPB, 3 g, Intravenous, On Call to OR, Ronny Bacon, MD   Physical exam:  Vitals:   05/20/19 1351  BP: (!) 145/80  Pulse: 78  Resp: 16  Temp: (!) 96.5 F (35.8 C)  TempSrc: Tympanic  Weight: 269 lb (122 kg)  Height: _0  (1.676 m)   Physical Exam Constitutional:      General: She is not in acute distress. Cardiovascular:     Rate and Rhythm: Normal rate and regular rhythm.     Heart sounds: Normal heart sounds.  Pulmonary:     Effort: Pulmonary effort is normal.     Breath sounds: Normal breath sounds.  Abdominal:     General: Bowel sounds are normal.     Palpations: Abdomen is soft.  Skin:    General: Skin is warm and dry.  Neurological:     Mental Status: She is alert and oriented to person, place, and time.   Breast: Steri-Strips present in the lower outer quadrant of the right breast.  No palpable  masses in either breast.  No palpable bilateral axillary adenopathy.    CMP Latest Ref Rng & Units 05/21/2019  Glucose 70 - 99 mg/dL 118(H)  BUN 6 - 20 mg/dL 17  Creatinine 0.44 - 1.00 mg/dL 0.66  Sodium 135 - 145 mmol/L 141  Potassium 3.5 - 5.1 mmol/L 4.2  Chloride 98 - 111 mmol/L 103  CO2 22 - 32 mmol/L 28  Calcium 8.9 - 10.3 mg/dL 9.1  Total Protein 6.5 - 8.1 g/dL -  Total Bilirubin 0.3 - 1.2 mg/dL -  Alkaline Phos 38 - 126 U/L -  AST 15 - 41 U/L -  ALT 14 - 54 U/L -   CBC Latest  Ref Rng & Units 05/21/2019  WBC 4.0 - 10.5 K/uL 6.1  Hemoglobin 12.0 - 15.0 g/dL 12.7  Hematocrit 36.0 - 46.0 % 39.6  Platelets 150 - 400 K/uL 244    No images are attached to the encounter.  US BREAST LTD UNI RIGHT INC AXILLA  Result Date: 04/29/2019 CLINICAL DATA:  Screening recall for right breast mass and possible left breast distortion. EXAM: DIGITAL DIAGNOSTIC BILATERAL MAMMOGRAM WITH CAD AND TOMO RIGHT BREAST ULTRASOUND COMPARISON:  Previous exam(s). ACR Breast Density Category b: There are scattered areas of fibroglandular density. FINDINGS: Cc and MLO tomograms were performed of the right breast. There is an irregular spiculated mass within the outer right breast measuring approximately 0.8 cm. Mammographic images were processed with CAD. Targeted ultrasound of the outer right breast was performed. There is an irregular hypoechoic mass at the 9 o'clock position 6 cm from nipple measuring 0.6 x 0.6 x 1 cm. A nearby duct connects with the mass. No lymphadenopathy seen in the right axilla. IMPRESSION: Suspicious mass in the right breast at the 9 o'clock position. RECOMMENDATION: Ultrasound-guided biopsy of the mass in the right breast at the 9 o'clock position is recommended. This will be scheduled for the patient. I have discussed the findings and recommendations with the patient. If applicable, a reminder letter will be sent to the patient regarding the next appointment. BI-RADS CATEGORY  5: Highly  suggestive of malignancy. Electronically Signed   By: Everlean Alstrom M.D.   On: 04/29/2019 15:08   MM DIAG BREAST TOMO BILATERAL  Result Date: 04/29/2019 CLINICAL DATA:  Screening recall for right breast mass and possible left breast distortion. EXAM: DIGITAL DIAGNOSTIC BILATERAL MAMMOGRAM WITH CAD AND TOMO RIGHT BREAST ULTRASOUND COMPARISON:  Previous exam(s). ACR Breast Density Category b: There are scattered areas of fibroglandular density. FINDINGS: Cc and MLO tomograms were performed of the right breast. There is an irregular spiculated mass within the outer right breast measuring approximately 0.8 cm. Mammographic images were processed with CAD. Targeted ultrasound of the outer right breast was performed. There is an irregular hypoechoic mass at the 9 o'clock position 6 cm from nipple measuring 0.6 x 0.6 x 1 cm. A nearby duct connects with the mass. No lymphadenopathy seen in the right axilla. IMPRESSION: Suspicious mass in the right breast at the 9 o'clock position. RECOMMENDATION: Ultrasound-guided biopsy of the mass in the right breast at the 9 o'clock position is recommended. This will be scheduled for the patient. I have discussed the findings and recommendations with the patient. If applicable, a reminder letter will be sent to the patient regarding the next appointment. BI-RADS CATEGORY  5: Highly suggestive of malignancy. Electronically Signed   By: Everlean Alstrom M.D.   On: 04/29/2019 15:08   MM CLIP PLACEMENT RIGHT  Result Date: 05/07/2019 CLINICAL DATA:  59 year old female presenting for biopsy of a right breast mass. EXAM: DIAGNOSTIC RIGHT MAMMOGRAM POST ULTRASOUND BIOPSY COMPARISON:  Previous exam(s). FINDINGS: Mammographic images were obtained following ultrasound guided biopsy of a right breast mass at 9 o'clock. The biopsy marking clip is in expected position at the site of biopsy. IMPRESSION: Appropriate positioning of the heart shaped biopsy marking clip at the site of biopsy in  the right breast at 9 o'clock. Final Assessment: Post Procedure Mammograms for Marker Placement Electronically Signed   By: Audie Pinto M.D.   On: 05/07/2019 13:50   [place] RT BREAST BX W LOC DEV 1ST LESION IMG BX SPEC US GUIDE  Addendum Date: 05/13/2019  ADDENDUM REPORT: 05/13/2019 10:37 ADDENDUM: PATHOLOGY revealed: A. RIGHT BREAST, 9:00 6CMFN; ULTRASOUND-GUIDED BIOPSY: - INVASIVE MAMMARY CARCINOMA, NO SPECIAL TYPE. 7 mm in this sample. Grade 2. Ductal carcinoma in situ: Not identified. Lymphovascular invasion: Not identified. Pathology results are CONCORDANT with imaging findings, per Dr. Audie Pinto. Pathology results and recommendations below were discussed with patient by telephone on 05/09/2019. Patient reported biopsy site doing well with slight tenderness at the site. Post biopsy care instructions were reviewed and questions were answered. Patient was instructed to call [facility] if any concerns or questions arise related to the biopsy. Recommendation: Surgical referral. Request for surgical referral was relayed to [place] and Tanya Nones RN at [facility] by Electa Sniff RN on 05/09/2019. Addendum by Electa Sniff RN on 05/13/2019. Electronically Signed   By: Audie Pinto M.D.   On: 05/13/2019 10:37   Result Date: 05/13/2019 CLINICAL DATA:  59 year old female presenting for biopsy of a right breast mass. EXAM: ULTRASOUND GUIDED RIGHT BREAST CORE NEEDLE BIOPSY COMPARISON:  Previous exam(s). PROCEDURE: I met with the patient and we discussed the procedure of ultrasound-guided biopsy, including benefits and alternatives. We discussed the high likelihood of a successful procedure. We discussed the risks of the procedure, including infection, bleeding, tissue injury, clip migration, and inadequate sampling. Informed written consent was given. The usual time-out protocol was performed immediately prior to the procedure. Lesion quadrant: Upper outer quadrant  Using sterile technique and 1% Lidocaine as local anesthetic, under direct ultrasound visualization, a 14 gauge spring-loaded device was used to perform biopsy of a right breast mass at 9 o'clock using a lateral approach. At the conclusion of the procedure a heart tissue marker clip was deployed into the biopsy cavity. Follow up 2 view mammogram was performed and dictated separately. IMPRESSION: Ultrasound guided biopsy of a right breast mass at 9 o'clock. No apparent complications. Electronically Signed: By: Audie Pinto M.D. On: 05/07/2019 13:49   MM RT RADIO FREQUENCY TAG LOC MAMMO GUIDE  Result Date: 05/21/2019 CLINICAL DATA:  59 year old with a biopsy-proven grade 2 invasive mammary carcinoma of no specific type (invasive ductal carcinoma) involving the OUTER RIGHT breast, slight UPPER OUTER QUADRANT. Radiofrequency tag localization is performed in anticipation of lumpectomy which is scheduled this Friday, 05/23/2019. EXAM: MAMMOGRAPHIC GUIDED RADIOFREQUENCY TAG LOCALIZATION OF THE RIGHT BREAST COMPARISON:  Previous exam(s). FINDINGS: Patient presents for radiofrequency tag localization prior to RIGHT breast lumpectomy. I met with the patient and we discussed the procedure of seed localization including benefits and alternatives. We discussed the high likelihood of a successful procedure. We discussed the risks of the procedure including infection, bleeding, tissue injury and further surgery. Informed, written consent was given. The usual time-out protocol was performed immediately prior to the procedure. Using mammographic guidance, sterile technique with chlorhexidine as skin antisepsis, and 1% lidocaine as local anesthetic, after making a small dermatotomy, the mass and heart shaped tissue marker clip were localized using a lateral approach. The follow-up mammogram images confirm the radiofrequency tag in the expected location immediately adjacent to the mass and clip. Images are marked for Dr.  Christian Mate. Follow-up survey of the patient confirms the presence of the tag. Unit number of the radiofrequency tag: [id number] The patient tolerated the procedure well and was released from the [place]. IMPRESSION: Radiofrequency tag localization of the RIGHT breast. No apparent complications. Electronically Signed   By: Evangeline Dakin M.D.   On: 05/21/2019 16:59    Assessment and plan- Patient is a  59 y.o. female newly diagnosed invasive mammary carcinoma of the right breast clinical prognostic stage Ia cT1 ccN0 cM0 ER/PR positive HER-2/neu negative on core biopsy  Patient is a relatively small breast mass.  No palpable axillary adenopathy or none noted on mammogram.  She has a strongly ER positive HER-2 negative tumor.  She will therefore proceed with upfront lumpectomy and sentinel lymph node biopsy.  I will see her back after her surgery to discuss final pathology.  Given that she had a grade 2 tumor on biopsy I would recommend Oncotype testing post surgery to see if she would benefit from adjuvant chemotherapy.  Discussed what Oncotype testing is and how the results are interpreted.  For her age if she falls in the low risks group with a score of less than 11 or intermediate risk group with a score between 11-25 she will not benefit from adjuvant chemotherapy.  Adjuvant chemotherapy would be indicated for a score of 26 or higher.  Treatment will be given with a curative intent  Given that her tumor is ER positive she will benefit from adjuvant hormone therapy for 5 years.  She will also benefit from adjuvant radiation treatment and will see radiation oncology after surgery.  Cancer Staging Cancer of right breast, stage 1, estrogen receptor positive ([place]) Staging form: Breast, AJCC 8th Edition - Clinical stage from 05/20/2019: Stage IA (cT1b, cN0, cM0, G2, ER+, PR+, HER2-) - Signed by Sindy Guadeloupe, MD on 05/23/2019     Thank you for this kind referral and the opportunity to participate in the  care of this patient   Visit Diagnosis 1. Cancer of right breast, stage 1, estrogen receptor positive ([place])   2. Goals of care, counseling/discussion     Dr. Randa Evens, MD, MPH [facility] [phone] 05/23/2019  8:23 AM

## 2019-05-23 NOTE — Interval H&P Note (Signed)
History and Physical Interval Note:  05/23/2019 11:34 AM  Nancy Patton  has presented today for surgery, with the diagnosis of Right breast cancer.  The various methods of treatment have been discussed with the patient and family. After consideration of risks, benefits and other options for treatment, the patient has consented to  Procedure(s): PARTIAL MASTECTOMY WITH RADIO FREQUENCY LOCALIZER AND AXILLARY SENTINEL LYMPH NODE BIOPSY (Right) as a surgical intervention.  The patient's history has been reviewed, patient examined, no change in status, stable for surgery.  I have reviewed the patient's chart and labs.  Questions were answered to the patient's satisfaction.   The right side is marked as the correct surgical site.  Ronny Bacon

## 2019-05-29 ENCOUNTER — Encounter: Payer: Self-pay | Admitting: *Deleted

## 2019-05-29 ENCOUNTER — Other Ambulatory Visit: Payer: Self-pay

## 2019-05-29 ENCOUNTER — Encounter: Payer: Self-pay | Admitting: Surgery

## 2019-05-29 ENCOUNTER — Ambulatory Visit (INDEPENDENT_AMBULATORY_CARE_PROVIDER_SITE_OTHER): Payer: 59 | Admitting: Surgery

## 2019-05-29 VITALS — BP 148/85 | HR 58 | Temp 97.5°F | Ht 66.0 in | Wt 272.0 lb

## 2019-05-29 DIAGNOSIS — Z170 Estrogen receptor positive status [ER+]: Secondary | ICD-10-CM

## 2019-05-29 DIAGNOSIS — C50911 Malignant neoplasm of unspecified site of right female breast: Secondary | ICD-10-CM

## 2019-05-29 DIAGNOSIS — Z9889 Other specified postprocedural states: Secondary | ICD-10-CM

## 2019-05-29 NOTE — Patient Instructions (Addendum)
You may return to work when you are ready. Continue to work the arm and stretch to keep your full range of motion.  Follow up here in 6 months for an exam.

## 2019-05-29 NOTE — Progress Notes (Signed)
[facility] POST-OP OFFICE VISIT  05/29/2019  HPI: Nancy Patton is a 59 y.o. female 6 days s/p RF ID localized right breast lumpectomy with sentinel lymph node biopsy.  The anticipated mild soreness over the axillary incision is all that she notes. She denies fevers and chills, and is discussing when to return to work.  Vital signs: BP (!) 148/85   Pulse (!) 58   Temp (!) 97.5 F (36.4 C)   Ht 5\' 6"  (1.676 m)   Wt 272 lb (123.4 kg)   SpO2 96%   BMI 43.90 kg/m    Physical Exam: Constitutional: She appears well.  Skin: Right breast and axillary incisions are clean, dry and intact.  Dermabond seal there is no significant ecchymosis.  There is no remarkable swelling.  Pathology: Breast specimen with negative margins.  Single sentinel lymph node negative for cancer.  Assessment/Plan: This is a 59 y.o. female 6 days s/p right breast lumpectomy by RF ID localization with sentinel lymph node biopsy.  Patient Active Problem List   Diagnosis Date Noted  . Cancer of right breast, stage 1, estrogen receptor positive ([place]) 05/15/2019  . COPD exacerbation ([place]) 08/24/2015  . HTN (hypertension) 07/10/2015  . Type 2 diabetes mellitus ([place]) 07/10/2015  . Depression 07/10/2015  . Acute respiratory distress 09/26/2014  . Asthmatic bronchitis with acute exacerbation 09/26/2014  . Asthmatic bronchitis with exacerbation 09/26/2014    -She has follow-up with her oncologist, and consultation with radiation oncology in the near future.  She will need follow-up diagnostic mammography of the right breast in 6 months, likely coordinated after her completion of radiation.  I will be glad to see her back as needed in the interim or will anticipate at least a 80-month follow-up visit.   Ronny Bacon M.D., FACS 05/29/2019, 1:55 PM

## 2019-05-29 NOTE — Progress Notes (Signed)
Called patient today to follow up on her post surgery.  States she is doing well.  No complaints.  She did not have post surgery follow up appointment.  Notified her surgeons office.  They will contact her today for an appointment.

## 2019-05-31 ENCOUNTER — Ambulatory Visit: Payer: 59 | Attending: Internal Medicine

## 2019-05-31 DIAGNOSIS — Z23 Encounter for immunization: Secondary | ICD-10-CM

## 2019-05-31 NOTE — Progress Notes (Signed)
   Covid-19 Vaccination Clinic  Name:  Nancy Patton    MRN: [id number] DOB: 11/05/1960  05/31/2019  [name] was observed post Covid-19 immunization for 15 minutes without incident. She was provided with Vaccine Information Sheet and instruction to access the V-Safe system.   [name] was instructed to call 911 with any severe reactions post vaccine: Marland Kitchen Difficulty breathing  . Swelling of face and throat  . A fast heartbeat  . A bad rash all over body  . Dizziness and weakness   Immunizations Administered    Name Date Dose VIS Date Route   Pfizer COVID-19 Vaccine 05/31/2019 12:57 PM 0.3 mL 01/31/2019 Intramuscular   Manufacturer: [place]   Lot: [id number]   [place]: [id number]

## 2019-06-02 ENCOUNTER — Inpatient Hospital Stay: Payer: No Typology Code available for payment source | Attending: Oncology

## 2019-06-02 ENCOUNTER — Telehealth: Payer: Self-pay

## 2019-06-02 NOTE — Telephone Encounter (Signed)
Oncotype order faxed on 06/02/2019.

## 2019-06-06 ENCOUNTER — Ambulatory Visit
Admission: RE | Admit: 2019-06-06 | Discharge: 2019-06-06 | Disposition: A | Discharge status: Home / Self Care | Payer: 59 | Source: Ambulatory Visit | Attending: Radiation Oncology | Admitting: Radiation Oncology

## 2019-06-06 ENCOUNTER — Other Ambulatory Visit: Payer: Self-pay

## 2019-06-06 ENCOUNTER — Inpatient Hospital Stay (HOSPITAL_BASED_OUTPATIENT_CLINIC_OR_DEPARTMENT_OTHER): Payer: No Typology Code available for payment source | Admitting: Oncology

## 2019-06-06 ENCOUNTER — Encounter: Payer: Self-pay | Admitting: Oncology

## 2019-06-06 VITALS — BP 129/80 | HR 69 | Temp 96.5°F | Ht 66.0 in | Wt 273.0 lb

## 2019-06-06 DIAGNOSIS — Z7984 Long term (current) use of oral hypoglycemic drugs: Secondary | ICD-10-CM | POA: Insufficient documentation

## 2019-06-06 DIAGNOSIS — E119 Type 2 diabetes mellitus without complications: Secondary | ICD-10-CM | POA: Diagnosis not present

## 2019-06-06 DIAGNOSIS — J45909 Unspecified asthma, uncomplicated: Secondary | ICD-10-CM | POA: Insufficient documentation

## 2019-06-06 DIAGNOSIS — E669 Obesity, unspecified: Secondary | ICD-10-CM | POA: Diagnosis not present

## 2019-06-06 DIAGNOSIS — C50011 Malignant neoplasm of nipple and areola, right female breast: Secondary | ICD-10-CM | POA: Insufficient documentation

## 2019-06-06 DIAGNOSIS — F17210 Nicotine dependence, cigarettes, uncomplicated: Secondary | ICD-10-CM | POA: Diagnosis not present

## 2019-06-06 DIAGNOSIS — Z170 Estrogen receptor positive status [ER+]: Secondary | ICD-10-CM | POA: Insufficient documentation

## 2019-06-06 DIAGNOSIS — Z79899 Other long term (current) drug therapy: Secondary | ICD-10-CM | POA: Insufficient documentation

## 2019-06-06 DIAGNOSIS — M129 Arthropathy, unspecified: Secondary | ICD-10-CM | POA: Diagnosis not present

## 2019-06-06 DIAGNOSIS — I272 Pulmonary hypertension, unspecified: Secondary | ICD-10-CM | POA: Diagnosis not present

## 2019-06-06 DIAGNOSIS — I10 Essential (primary) hypertension: Secondary | ICD-10-CM | POA: Diagnosis not present

## 2019-06-06 DIAGNOSIS — Z7189 Other specified counseling: Secondary | ICD-10-CM

## 2019-06-06 DIAGNOSIS — C50911 Malignant neoplasm of unspecified site of right female breast: Secondary | ICD-10-CM

## 2019-06-06 NOTE — Progress Notes (Signed)
Patient stated that she had been doing well with no complaints. Patient stated that she would like to know how often she should have mammograms, is she cancer free and/or what medication will she be taking for so many amount of years.

## 2019-06-06 NOTE — Consult Note (Signed)
NEW PATIENT EVALUATION  Name: Nancy Patton  MRN: [id number]  Date:   06/06/2019     DOB: Nov 23, 1960   This 59 y.o. 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  REFERRING PHYSICIAN: Perrin Maltese, MD  CHIEF COMPLAINT: No chief complaint on file.   DIAGNOSIS: The encounter diagnosis was Cancer of right breast, stage 1, estrogen receptor positive ([place]).   PREVIOUS INVESTIGATIONS:  Mammogram and ultrasound reviewed Surgical pathology reviewed Clinical notes reviewed  HPI: Patient is a 59 year old female who presented with an abnormal mammogram of her right breast.  On screening mammogram area of possible distortion left breast was detected at the 9 o'clock position confirmed on ultrasound and biopsy positive for invasive mammary carcinoma.  Lesion was noted to be approximately 6 mm.  Tumor was strongly ER positive PR positive and HER-2/neu negative.  She underwent a wide local excision for a 1.2 x 1.2 x 1 cm invasive mammary carcinoma overall grade 2.  Margins were clear but close at 1.5 mm DCIS was involved although margins of that were also clear greater than 2 mm.  1 sentinel lymph node was negative.  Oncotype DX has been ordered and is pending.  She is seen today for radiation oncology evaluation she is doing well she specifically denies breast tenderness cough or bone pain.  PLANNED TREATMENT REGIMEN: Right whole breast radiation  PAST MEDICAL HISTORY:  has a past medical history of Arthritis, Asthma, Breast cancer ([place]), Diabetes mellitus without complication ([place]), Hypertension, Obesity, Pulmonary hypertension ([place]), and Sleep apnea.    PAST SURGICAL HISTORY:  Past Surgical History:  Procedure Laterality Date  . CATARACT EXTRACTION    . CESAREAN SECTION     x 3     FAMILY HISTORY: family history includes  Diabetes in her brother, father, and mother; Stroke in her mother.  SOCIAL HISTORY:  reports that she has quit smoking. Her smoking use included cigarettes. She has never used smokeless tobacco. She reports that she does not drink alcohol or use drugs.  ALLERGIES: Patient has no known allergies.  MEDICATIONS:  Current Outpatient Medications  Medication Sig Dispense Refill  . albuterol (PROVENTIL HFA;VENTOLIN HFA) 108 (90 BASE) MCG/ACT inhaler Inhale 4-6 puffs by mouth every 4 hours as needed for wheezing, cough, and/or shortness of breath (Patient taking differently: Inhale 2 puffs into the lungs every 4 (four) hours as needed for wheezing or shortness of breath. ) 1 Inhaler 1  . budesonide-formoterol (SYMBICORT) 160-4.5 MCG/ACT inhaler Inhale 2 puffs into the lungs 2 (two) times daily.    . citalopram (CELEXA) 40 MG tablet Take 40 mg by mouth daily.   10  . ergocalciferol (VITAMIN D2) 1.25 MG (50000 UT) capsule Take 50,000 Units by mouth once a week.    Marland Kitchen ibuprofen (ADVIL) 800 MG tablet Take 1 tablet (800 mg total) by mouth every 8 (eight) hours as needed. (Patient not taking: Reported on 06/06/2019) 30 tablet 0  . losartan-hydrochlorothiazide (HYZAAR) 50-12.5 MG per tablet Take 1 tablet by mouth daily.  5  . metFORMIN (GLUCOPHAGE) 1000 MG tablet Take 1,000 mg by mouth 2 (two) times daily.  2  . montelukast (SINGULAIR) 10 MG tablet Take 10 mg by mouth every morning.     . Omega-3 Fatty Acids (FISH OIL) 1000 MG CAPS Take 1,000 mg by mouth in the morning and at  bedtime.     No current facility-administered medications for this encounter.    ECOG PERFORMANCE STATUS:  0 - Asymptomatic  REVIEW OF SYSTEMS: Patient denies any weight loss, fatigue, weakness, fever, chills or night sweats. Patient denies any loss of vision, blurred vision. Patient denies any ringing  of the ears or hearing loss. No irregular heartbeat. Patient denies heart murmur or history of fainting. Patient denies any chest pain  or pain radiating to her upper extremities. Patient denies any shortness of breath, difficulty breathing at night, cough or hemoptysis. Patient denies any swelling in the lower legs. Patient denies any nausea vomiting, vomiting of blood, or coffee ground material in the vomitus. Patient denies any stomach pain. Patient states has had normal bowel movements no significant constipation or diarrhea. Patient denies any dysuria, hematuria or significant nocturia. Patient denies any problems walking, swelling in the joints or loss of balance. Patient denies any skin changes, loss of hair or loss of weight. Patient denies any excessive worrying or anxiety or significant depression. Patient denies any problems with insomnia. Patient denies excessive thirst, polyuria, polydipsia. Patient denies any swollen glands, patient denies easy bruising or easy bleeding. Patient denies any recent infections, allergies or URI. Patient "s visual fields have not changed significantly in recent time.   PHYSICAL EXAM: There were no vitals taken for this visit. Obese female in NAD.  Right breast has both a wide local excision scar and sentinel node scar both healing well.  No dominant mass or nodularity is noted in either breast in 2 positions examined.  No axillary or supraclavicular adenopathy is detected.  Well-developed well-nourished patient in NAD. HEENT reveals PERLA, EOMI, discs not visualized.  Oral cavity is clear. No oral mucosal lesions are identified. Neck is clear without evidence of cervical or supraclavicular adenopathy. Lungs are clear to A&P. Cardiac examination is essentially unremarkable with regular rate and rhythm without murmur rub or thrill. Abdomen is benign with no organomegaly or masses noted. Motor sensory and DTR levels are equal and symmetric in the upper and lower extremities. Cranial nerves II through XII are grossly intact. Proprioception is intact. No peripheral adenopathy or edema is identified. No  motor or sensory levels are noted. Crude visual fields are within normal range.  LABORATORY DATA: Pathology report reviewed    RADIOLOGY RESULTS: Mammogram and ultrasound reviewed compatible with above-stated findings   IMPRESSION: Stage I invasive mammary carcinoma the right breast status post wide local excision and sentinel node biopsy ER/PR positive in 59 year old female.  PLAN: At this time we will wait for the Oncotype DX results to be reported.  I have recommended whole breast radiation.  Her breast is large and pendulous making hypofractionated course of treatment difficult would plan on delivering 5040 cGy in 28 fractions.  Would also boost her scar another 1440 centigrade centigrade based on her close margin.  Risks and benefits of treatment occluding skin reaction fatigue alteration of blood counts possible occlusion of superficial lung all were described in detail to the patient.  She seems to comprehend her treatment plan well.  Patient will benefit from antiestrogen therapy after completion of radiation.  Again I set her up for simulation about a week's time hopefully Oncotype DX will be back by then and we can proceed.  If she needs chemotherapy based on Oncotype DX will sequence her radiation after that.  Patient comprehends my treatment plan arimadex recommendations well.  I have personally set up and ordered CT simulation.  I would like  to take this opportunity to thank you for allowing me to participate in the care of your patient.Noreene Filbert, MD

## 2019-06-09 ENCOUNTER — Telehealth: Payer: Self-pay | Admitting: *Deleted

## 2019-06-09 NOTE — Progress Notes (Signed)
Hematology/Oncology Consult note [facility]  Telephone:([phone] [phone]  Patient Care Team: Perrin Maltese, MD as PCP - General (Internal Medicine) Noreene Filbert, MD as Radiation Oncologist (Radiation Oncology)   Name of the patient: Nancy Patton  [id number]  January 23, 1961   Date of visit: 06/09/19  Diagnosis-pathological prognostic stage IA pT1c pN0 cM0 ER positive PR positive HER-2 negative s/p lumpectomy  Chief complaint/ Reason for visit-discuss pathology results and further management  Heme/Onc history: Patient is a 59 year old female who recently underwent a screening mammogram which showed a possible mass in the right breast.  This was followed by diagnostic mammogram and ultrasound which showed an irregular related mass 0.6 x 0.6 x 1 cm at the 9 o'clock position 6 cm from the nipple.  This was biopsied and was consistent with invasive mammary carcinoma 7 mm grade ER 91 200% positive PR 1 to 10% positive and HER-2 negative.  Patient has seen Dr. Christian Mate and plans to get a lumpectomy soon.  She has a remote use of birth control pills.  She is postmenopausal.  Menarche at the age of 71.  G3 P3 L3.  No known prior breast biopsies.  No family history of breast cancer, colon gastric or pancreatic cancer.  Final pathology showed invasive mammary carcinoma no special type XII millimeters, grade 2.  1 sentinel lymph node negative for malignancy.  Margins negative.  Interval history-patient is doing well post lumpectomy and has no specific complaints at this time.  ECOG PS- 1 Pain scale- 0   Review of systems- Review of Systems  Constitutional: Negative for chills, fever, malaise/fatigue and weight loss.  HENT: Negative for congestion, ear discharge and nosebleeds.   Eyes: Negative for blurred vision.  Respiratory: Negative for cough, hemoptysis, sputum production, shortness of breath and wheezing.   Cardiovascular: Negative for  chest pain, palpitations, orthopnea and claudication.  Gastrointestinal: Negative for abdominal pain, blood in stool, constipation, diarrhea, heartburn, melena, nausea and vomiting.  Genitourinary: Negative for dysuria, flank pain, frequency, hematuria and urgency.  Musculoskeletal: Negative for back pain, joint pain and myalgias.  Skin: Negative for rash.  Neurological: Negative for dizziness, tingling, focal weakness, seizures, weakness and headaches.  Endo/Heme/Allergies: Does not bruise/bleed easily.  Psychiatric/Behavioral: Negative for depression and suicidal ideas. The patient does not have insomnia.        No Known Allergies   Past Medical History:  Diagnosis Date  . Arthritis   . Asthma   . Breast cancer ([place])   . Diabetes mellitus without complication ([place])   . Hypertension   . Obesity   . Pulmonary hypertension ([place])   . Sleep apnea      Past Surgical History:  Procedure Laterality Date  . CATARACT EXTRACTION    . CESAREAN SECTION     x 3     Social History   Socioeconomic History  . Marital status: Single    Spouse name: Not on file  . Number of children: Not on file  . Years of education: Not on file  . Highest education level: Not on file  Occupational History  . Not on file  Tobacco Use  . Smoking status: Former Smoker    Types: Cigarettes  . Smokeless tobacco: Never Used  . Tobacco comment: Quit smoking 30 years ago.  Substance and Sexual Activity  . Alcohol use: No  . Drug use: No  . Sexual activity: Not Currently  Other Topics Concern  . Not on file  Social History Narrative  . Not on file   Social Determinants of Health   Financial Resource Strain:   . Difficulty of Paying Living Expenses:   Food Insecurity:   . Worried About Charity fundraiser in the Last Year:   . Arboriculturist in the Last Year:   Transportation Needs:   . Film/video editor (Medical):   Marland Kitchen Lack of Transportation (Non-Medical):   Physical Activity:   .  Days of Exercise per Week:   . Minutes of Exercise per Session:   Stress:   . Feeling of Stress :   Social Connections:   . Frequency of Communication with Friends and Family:   . Frequency of Social Gatherings with Friends and Family:   . Attends Religious Services:   . Active Member of Clubs or Organizations:   . Attends Archivist Meetings:   Marland Kitchen Marital Status:   Intimate Partner Violence:   . Fear of Current or Ex-Partner:   . Emotionally Abused:   Marland Kitchen Physically Abused:   . Sexually Abused:     Family History  Problem Relation Age of Onset  . Diabetes Mother   . Stroke Mother   . Diabetes Father   . Diabetes Brother   . Cancer Neg Hx   . Heart failure Neg Hx   . Hypertension Neg Hx   . Breast cancer Neg Hx      Current Outpatient Medications:  .  albuterol (PROVENTIL HFA;VENTOLIN HFA) 108 (90 BASE) MCG/ACT inhaler, Inhale 4-6 puffs by mouth every 4 hours as needed for wheezing, cough, and/or shortness of breath (Patient taking differently: Inhale 2 puffs into the lungs every 4 (four) hours as needed for wheezing or shortness of breath. ), Disp: 1 Inhaler, Rfl: 1 .  budesonide-formoterol (SYMBICORT) 160-4.5 MCG/ACT inhaler, Inhale 2 puffs into the lungs 2 (two) times daily., Disp: , Rfl:  .  citalopram (CELEXA) 40 MG tablet, Take 40 mg by mouth daily. , Disp: , Rfl: 10 .  ergocalciferol (VITAMIN D2) 1.25 MG (50000 UT) capsule, Take 50,000 Units by mouth once a week., Disp: , Rfl:  .  losartan-hydrochlorothiazide (HYZAAR) 50-12.5 MG per tablet, Take 1 tablet by mouth daily., Disp: , Rfl: 5 .  metFORMIN (GLUCOPHAGE) 1000 MG tablet, Take 1,000 mg by mouth 2 (two) times daily., Disp: , Rfl: 2 .  montelukast (SINGULAIR) 10 MG tablet, Take 10 mg by mouth every morning. , Disp: , Rfl:  .  Omega-3 Fatty Acids (FISH OIL) 1000 MG CAPS, Take 1,000 mg by mouth in the morning and at bedtime., Disp: , Rfl:  .  ibuprofen (ADVIL) 800 MG tablet, Take 1 tablet (800 mg total) by mouth  every 8 (eight) hours as needed. (Patient not taking: Reported on 06/06/2019), Disp: 30 tablet, Rfl: 0  Physical exam:  Vitals:   06/06/19 0933  BP: 129/80  Pulse: 69  Temp: (!) 96.5 F (35.8 C)  TempSrc: Tympanic  SpO2: 98%  Weight: 273 lb (123.8 kg)  Height: '5\' 6"'  (1.676 m)   Physical Exam Constitutional:      General: She is not in acute distress. Pulmonary:     Effort: Pulmonary effort is normal.  Skin:    General: Skin is warm and dry.  Neurological:     Mental Status: She is alert and oriented to person, place, and time.   Patient is s/p right lumpectomy with a surgical scar that appears to be healing well.  CMP Latest Ref Rng &  Units 05/21/2019  Glucose 70 - 99 mg/dL 118(H)  BUN 6 - 20 mg/dL 17  Creatinine 0.44 - 1.00 mg/dL 0.66  Sodium 135 - 145 mmol/L 141  Potassium 3.5 - 5.1 mmol/L 4.2  Chloride 98 - 111 mmol/L 103  CO2 22 - 32 mmol/L 28  Calcium 8.9 - 10.3 mg/dL 9.1  Total Protein 6.5 - 8.1 g/dL -  Total Bilirubin 0.3 - 1.2 mg/dL -  Alkaline Phos 38 - 126 U/L -  AST 15 - 41 U/L -  ALT 14 - 54 U/L -   CBC Latest Ref Rng & Units 05/21/2019  WBC 4.0 - 10.5 K/uL 6.1  Hemoglobin 12.0 - 15.0 g/dL 12.7  Hematocrit 36.0 - 46.0 % 39.6  Platelets 150 - 400 K/uL 244    No images are attached to the encounter.  NM SENTINEL NODE INJECTION  Result Date: 05/23/2019 CLINICAL DATA:  Right breast cancer. EXAM: NUCLEAR MEDICINE BREAST LYMPHOSCINTIGRAPHY RIGHT BREAST TECHNIQUE: Intradermal injection of radiopharmaceutical was performed at the 12 o'clock, 3 o'clock, 6 o'clock, and 9 o'clock positions around the right nipple. The patient was then sent to the operating room where the sentinel node(s) were identified and removed by the surgeon. RADIOPHARMACEUTICALS:  Total of 0.7 mCi Millipore-filtered Technetium-50msulfur colloid, injected in four aliquots. IMPRESSION: Uncomplicated intradermal injection of a total of 0.7 mCi Technetium-965mulfur colloid for purposes of sentinel  node identification. Electronically Signed   By: ThMarcello MooresRegister   On: 05/23/2019 09:28   MM Breast Surgical Specimen  Result Date: 05/23/2019 CLINICAL DATA:  [age]ear old female with history of newly diagnosed right breast cancer underwent lumpectomy. EXAM: SPECIMEN RADIOGRAPH OF THE RIGHT BREAST COMPARISON:  Previous exam(s). FINDINGS: Status post excision of the right breast. The RF tag and biopsy marker clip are present, completely intact, and were marked for pathology. IMPRESSION: Specimen radiograph of the right breast. Electronically Signed   By: NaAudie Pinto.D.   On: 05/23/2019 13:37   MM RT RADIO FREQUENCY TAG LOC MAMMO GUIDE  Result Date: 05/21/2019 CLINICAL DATA:  [age]ear old with a biopsy-proven grade 2 invasive mammary carcinoma of no specific type (invasive ductal carcinoma) involving the OUTER RIGHT breast, slight UPPER OUTER QUADRANT. Radiofrequency tag localization is performed in anticipation of lumpectomy which is scheduled this Friday, 05/23/2019. EXAM: MAMMOGRAPHIC GUIDED RADIOFREQUENCY TAG LOCALIZATION OF THE RIGHT BREAST COMPARISON:  Previous exam(s). FINDINGS: Patient presents for radiofrequency tag localization prior to RIGHT breast lumpectomy. I met with the patient and we discussed the procedure of seed localization including benefits and alternatives. We discussed the high likelihood of a successful procedure. We discussed the risks of the procedure including infection, bleeding, tissue injury and further surgery. Informed, written consent was given. The usual time-out protocol was performed immediately prior to the procedure. Using mammographic guidance, sterile technique with chlorhexidine as skin antisepsis, and 1% lidocaine as local anesthetic, after making a small dermatotomy, the mass and heart shaped tissue marker clip were localized using a lateral approach. The follow-up mammogram images confirm the radiofrequency tag in the expected location immediately adjacent to  the mass and clip. Images are marked for Dr. RoChristian MateFollow-up survey of the patient confirms the presence of the tag. Unit number of the radiofrequency tag: [id number] patient tolerated the procedure well and was released from the BrSherrardIMPRESSION: Radiofrequency tag localization of the RIGHT breast. No apparent complications. Electronically Signed   By: ThEvangeline Dakin.D.   On: 05/21/2019 16:59     Assessment and plan-  Patient is a 59 y.o. female with invasive mammary carcinoma of the right breast pathological prognostic stage I A pT1c pN0 ER/PR positive HER-2/neu negative here to discuss final pathology results and further management  Final pathology showed 1.2 cm grade 2 tumor with negative margins.  1 sentinel lymph node was negative for malignancy.  Oncotype testing has been sent out and is not back presently.  We will call the patient with the results of Oncotype.  She will also be seeing radiation oncology to discuss adjuvant radiation treatment.   If patient does not require adjuvant chemotherapy based on Oncotype testing then she will proceed with adjuvant radiation.Given that her tumor was ER PR positive hormone therapy is indicated at this time.  Idiscussed the role for hormone therapy. Given that she is postmenopausal I would favor 5 years of adjuvant hormone therapy with aromatase inhibitor. I discussed the risks and benefits of letrozole including all but not limited to fatigue, hypercholesterolemia, hot flashes, arthralgias and worsening bone health.  Patient will also need to be on calcium 1200 mg along with vitamin D 800 international units.  We will obtain a baseline bone density scan written information about Letrozole given to the patient. I would like her to finish radiation therapy and start hormone therapy thereafter. Patient verbalized understanding and agrees to proceed.  Treatment will be given with a curative intent  I will see her back in 4 months time after she  completes radiation treatment and has remained on hormone therapy for about 6 weeks.  She understands that this will change if Oncotype testing revealed need for chemotherapy.  We will obtain bone density scan in the next 4 months.   Cancer Staging Cancer of right breast, stage 1, estrogen receptor positive ([place]) Staging form: Breast, AJCC 8th Edition - Clinical stage from 05/20/2019: Stage IA (cT1b, cN0, cM0, G2, ER+, PR+, HER2-) - Signed by Sindy Guadeloupe, MD on 05/23/2019 - Pathologic stage from 06/09/2019: Stage IA (pT1c, pN0, cM0, G2, ER+, PR+, HER2-) - Signed by Sindy Guadeloupe, MD on 06/09/2019      Visit Diagnosis 1. Cancer of right breast, stage 1, estrogen receptor positive ([place])   2. High risk medication use      Dr. Randa Evens, MD, MPH [facility] [phone] 06/09/2019 2:14 PM

## 2019-06-09 NOTE — Telephone Encounter (Signed)
Patient called to assure understanding of treatment plan and education received at consult visit. Patient verbalized understanding of both. Patient encouraged to contact Radiation Oncology should questions arise.

## 2019-06-10 ENCOUNTER — Encounter: Payer: Self-pay | Admitting: Oncology

## 2019-06-10 ENCOUNTER — Telehealth: Payer: Self-pay | Admitting: *Deleted

## 2019-06-10 ENCOUNTER — Other Ambulatory Visit: Payer: Self-pay | Admitting: *Deleted

## 2019-06-10 MED ORDER — LETROZOLE 2.5 MG PO TABS: 2.5000 mg | ORAL_TABLET | Freq: Every day | ORAL | 3 refills | Status: DC

## 2019-06-10 NOTE — Telephone Encounter (Signed)
Called patient to let her know that her Oncotype score was a 50 therefore she does not need to take any chemotherapy she will proceed with her radiation' finishing radiation she will start on her letrozole which I have called into her pharmacy and put a note that she is not to start it until she completes radiation.  Already has an appointment in August and by that time she will finish radiation and start taking the letrozole and be about 4 to 6 weeks and when she comes to see [place] in August patient had no questions would like me to send her a copy of the side effects of letrozole in an email and I have done that already.  Patient was given our phone number to call back if she has any further questions

## 2019-06-11 ENCOUNTER — Encounter: Payer: Self-pay | Admitting: Oncology

## 2019-06-11 ENCOUNTER — Other Ambulatory Visit: Payer: Self-pay

## 2019-06-12 ENCOUNTER — Ambulatory Visit
Admission: RE | Admit: 2019-06-12 | Discharge: 2019-06-12 | Disposition: A | Discharge status: Home / Self Care | Payer: No Typology Code available for payment source | Source: Ambulatory Visit | Attending: Radiation Oncology | Admitting: Radiation Oncology

## 2019-06-12 ENCOUNTER — Encounter: Payer: Self-pay | Admitting: *Deleted

## 2019-06-12 DIAGNOSIS — C50811 Malignant neoplasm of overlapping sites of right female breast: Secondary | ICD-10-CM | POA: Diagnosis not present

## 2019-06-18 DIAGNOSIS — C50811 Malignant neoplasm of overlapping sites of right female breast: Secondary | ICD-10-CM | POA: Diagnosis not present

## 2019-06-23 ENCOUNTER — Other Ambulatory Visit: Payer: Self-pay | Admitting: *Deleted

## 2019-06-23 ENCOUNTER — Ambulatory Visit: Admission: RE | Admit: 2019-06-23 | Payer: No Typology Code available for payment source | Source: Ambulatory Visit

## 2019-06-23 ENCOUNTER — Encounter: Payer: Self-pay | Admitting: *Deleted

## 2019-06-23 DIAGNOSIS — C50811 Malignant neoplasm of overlapping sites of right female breast: Secondary | ICD-10-CM | POA: Insufficient documentation

## 2019-06-23 DIAGNOSIS — Z510 Encounter for antineoplastic radiation therapy: Secondary | ICD-10-CM | POA: Diagnosis present

## 2019-06-23 DIAGNOSIS — Z170 Estrogen receptor positive status [ER+]: Secondary | ICD-10-CM | POA: Diagnosis not present

## 2019-06-23 DIAGNOSIS — C50911 Malignant neoplasm of unspecified site of right female breast: Secondary | ICD-10-CM | POA: Diagnosis not present

## 2019-06-23 NOTE — Research (Signed)
Met with patient Nancy Patton to offer participation in the DCP-001 study to her today. Explained purpose of the study to her with emphasis on the fact that the [place] is interested in learning more about why patients elect to participate or decline to participate in [place] sponsored trials. Informed patient that participation is voluntary, that she will not be paid to participate and that she will have to provide formal informed consent to participate. Reviewed the type of information that will be collected and patient was given a copy of the informed consent form along with my contact information to take home and review. Plans made with patient to call her in 2-3 days to determine whether she is interested in study participation, and will determine a time when we can meet to obtain written consent if she is interested. Patient was thanked for her interest and for considering participation in clinical trials. Yolande Jolly, BSN, MHA, OCN 06/23/2019 10:09 AM   Patient Nancy Patton was reached by phone yesterday and reported she was interested in study participation for DCP-001. Plans were made for patient to provide written consent and complete the study worksheet this morning following her scheduled radiation therapy. Met with patient in Radiation department this morning and provided another brief review of the ICF, purpose of the study and information that will be collected. Patient denied having any further questions about the study. She is aware that participation is voluntary, that she can withdraw at any time, minimal potential risks and benefits, that her information will be kept confidential, and that she will not be paid for participating. Patient signed ICF for DCP-001 Protocol Version Date: 08/13/18 as well as corresponding [facility] HIPAA dated 04/13/14. Copies of all signed forms were provided to patient along with my contact information. Nancy Patton then completed the DCP-001 worksheet  providing responses to questions that are not found in the EMR. Patient declined participation in the [place] trial citing that it would be too overwhelming to participate in this study because she is still trying to work full-time. Patient was thanked for her time and for participating in Oncology Research. Yolande Jolly, BSN, MHA, OCN 07/04/2019 10:07 AM

## 2019-06-24 ENCOUNTER — Ambulatory Visit
Admission: RE | Admit: 2019-06-24 | Discharge: 2019-06-24 | Disposition: A | Discharge status: Home / Self Care | Payer: No Typology Code available for payment source | Source: Ambulatory Visit | Attending: Radiation Oncology | Admitting: Radiation Oncology

## 2019-06-24 ENCOUNTER — Ambulatory Visit: Payer: 59 | Attending: Internal Medicine

## 2019-06-24 DIAGNOSIS — Z23 Encounter for immunization: Secondary | ICD-10-CM

## 2019-06-24 DIAGNOSIS — Z510 Encounter for antineoplastic radiation therapy: Secondary | ICD-10-CM | POA: Diagnosis not present

## 2019-06-24 LAB — SURGICAL PATHOLOGY

## 2019-06-24 NOTE — Progress Notes (Signed)
   Covid-19 Vaccination Clinic  Name:  Nancy Patton    MRN: [id number] DOB: 1961-01-10  06/24/2019  Nancy Patton was observed post Covid-19 immunization for 15 minutes without incident. She was provided with Vaccine Information Sheet and instruction to access the V-Safe system.   Nancy Patton was instructed to call 911 with any severe reactions post vaccine: Marland Kitchen Difficulty breathing  . Swelling of face and throat  . A fast heartbeat  . A bad rash all over body  . Dizziness and weakness   Immunizations Administered    Name Date Dose VIS Date [place] COVID-19 Vaccine 06/24/2019 12:48 PM 0.3 mL 04/16/2018 Intramuscular   Manufacturer: [place]   Lot: [id number]   [place]: [id number]

## 2019-06-25 ENCOUNTER — Ambulatory Visit
Admission: RE | Admit: 2019-06-25 | Discharge: 2019-06-25 | Disposition: A | Discharge status: Home / Self Care | Payer: No Typology Code available for payment source | Source: Ambulatory Visit | Attending: Radiation Oncology | Admitting: Radiation Oncology

## 2019-06-25 DIAGNOSIS — Z510 Encounter for antineoplastic radiation therapy: Secondary | ICD-10-CM | POA: Diagnosis not present

## 2019-06-26 ENCOUNTER — Ambulatory Visit
Admission: RE | Admit: 2019-06-26 | Discharge: 2019-06-26 | Disposition: A | Discharge status: Home / Self Care | Payer: No Typology Code available for payment source | Source: Ambulatory Visit | Attending: Radiation Oncology | Admitting: Radiation Oncology

## 2019-06-26 DIAGNOSIS — Z510 Encounter for antineoplastic radiation therapy: Secondary | ICD-10-CM | POA: Diagnosis not present

## 2019-06-27 ENCOUNTER — Ambulatory Visit
Admission: RE | Admit: 2019-06-27 | Discharge: 2019-06-27 | Disposition: A | Discharge status: Home / Self Care | Payer: No Typology Code available for payment source | Source: Ambulatory Visit | Attending: Radiation Oncology | Admitting: Radiation Oncology

## 2019-06-27 DIAGNOSIS — Z510 Encounter for antineoplastic radiation therapy: Secondary | ICD-10-CM | POA: Diagnosis not present

## 2019-06-30 ENCOUNTER — Ambulatory Visit
Admission: RE | Admit: 2019-06-30 | Discharge: 2019-06-30 | Disposition: A | Discharge status: Home / Self Care | Payer: No Typology Code available for payment source | Source: Ambulatory Visit | Attending: Radiation Oncology | Admitting: Radiation Oncology

## 2019-06-30 DIAGNOSIS — Z510 Encounter for antineoplastic radiation therapy: Secondary | ICD-10-CM | POA: Diagnosis not present

## 2019-07-01 ENCOUNTER — Ambulatory Visit
Admission: RE | Admit: 2019-07-01 | Discharge: 2019-07-01 | Disposition: A | Discharge status: Home / Self Care | Payer: No Typology Code available for payment source | Source: Ambulatory Visit | Attending: Radiation Oncology | Admitting: Radiation Oncology

## 2019-07-01 DIAGNOSIS — Z510 Encounter for antineoplastic radiation therapy: Secondary | ICD-10-CM | POA: Diagnosis not present

## 2019-07-02 ENCOUNTER — Ambulatory Visit
Admission: RE | Admit: 2019-07-02 | Discharge: 2019-07-02 | Disposition: A | Discharge status: Home / Self Care | Payer: No Typology Code available for payment source | Source: Ambulatory Visit | Attending: Radiation Oncology | Admitting: Radiation Oncology

## 2019-07-02 DIAGNOSIS — Z510 Encounter for antineoplastic radiation therapy: Secondary | ICD-10-CM | POA: Diagnosis not present

## 2019-07-03 ENCOUNTER — Ambulatory Visit
Admission: RE | Admit: 2019-07-03 | Discharge: 2019-07-03 | Disposition: A | Discharge status: Home / Self Care | Payer: No Typology Code available for payment source | Source: Ambulatory Visit | Attending: Radiation Oncology | Admitting: Radiation Oncology

## 2019-07-03 DIAGNOSIS — Z510 Encounter for antineoplastic radiation therapy: Secondary | ICD-10-CM | POA: Diagnosis not present

## 2019-07-04 ENCOUNTER — Ambulatory Visit
Admission: RE | Admit: 2019-07-04 | Discharge: 2019-07-04 | Disposition: A | Discharge status: Home / Self Care | Payer: No Typology Code available for payment source | Source: Ambulatory Visit | Attending: Radiation Oncology | Admitting: Radiation Oncology

## 2019-07-04 DIAGNOSIS — Z510 Encounter for antineoplastic radiation therapy: Secondary | ICD-10-CM | POA: Diagnosis not present

## 2019-07-07 ENCOUNTER — Ambulatory Visit
Admission: RE | Admit: 2019-07-07 | Discharge: 2019-07-07 | Disposition: A | Discharge status: Home / Self Care | Payer: No Typology Code available for payment source | Source: Ambulatory Visit | Attending: Radiation Oncology | Admitting: Radiation Oncology

## 2019-07-07 DIAGNOSIS — Z510 Encounter for antineoplastic radiation therapy: Secondary | ICD-10-CM | POA: Diagnosis not present

## 2019-07-08 ENCOUNTER — Ambulatory Visit
Admission: RE | Admit: 2019-07-08 | Discharge: 2019-07-08 | Disposition: A | Discharge status: Home / Self Care | Payer: No Typology Code available for payment source | Source: Ambulatory Visit | Attending: Radiation Oncology | Admitting: Radiation Oncology

## 2019-07-08 DIAGNOSIS — Z510 Encounter for antineoplastic radiation therapy: Secondary | ICD-10-CM | POA: Diagnosis not present

## 2019-07-09 ENCOUNTER — Inpatient Hospital Stay: Payer: No Typology Code available for payment source | Attending: Radiation Oncology

## 2019-07-09 ENCOUNTER — Other Ambulatory Visit: Payer: Self-pay

## 2019-07-09 ENCOUNTER — Ambulatory Visit
Admission: RE | Admit: 2019-07-09 | Discharge: 2019-07-09 | Disposition: A | Discharge status: Home / Self Care | Payer: No Typology Code available for payment source | Source: Ambulatory Visit | Attending: Radiation Oncology | Admitting: Radiation Oncology

## 2019-07-09 DIAGNOSIS — Z510 Encounter for antineoplastic radiation therapy: Secondary | ICD-10-CM | POA: Diagnosis not present

## 2019-07-09 DIAGNOSIS — Z170 Estrogen receptor positive status [ER+]: Secondary | ICD-10-CM | POA: Insufficient documentation

## 2019-07-09 DIAGNOSIS — C50911 Malignant neoplasm of unspecified site of right female breast: Secondary | ICD-10-CM | POA: Insufficient documentation

## 2019-07-09 LAB — CBC
HCT: 37.8 % (ref 36.0–46.0)
Hemoglobin: 12.5 g/dL (ref 12.0–15.0)
MCH: 29.4 pg (ref 26.0–34.0)
MCHC: 33.1 g/dL (ref 30.0–36.0)
MCV: 88.9 fL (ref 80.0–100.0)
Platelets: 198 10*3/uL (ref 150–400)
RBC: 4.25 MIL/uL (ref 3.87–5.11)
RDW: 12.9 % (ref 11.5–15.5)
WBC: 5.3 10*3/uL (ref 4.0–10.5)
nRBC: 0 % (ref 0.0–0.2)

## 2019-07-10 ENCOUNTER — Ambulatory Visit
Admission: RE | Admit: 2019-07-10 | Discharge: 2019-07-10 | Disposition: A | Discharge status: Home / Self Care | Payer: No Typology Code available for payment source | Source: Ambulatory Visit | Attending: Radiation Oncology | Admitting: Radiation Oncology

## 2019-07-10 DIAGNOSIS — Z510 Encounter for antineoplastic radiation therapy: Secondary | ICD-10-CM | POA: Diagnosis not present

## 2019-07-11 ENCOUNTER — Ambulatory Visit
Admission: RE | Admit: 2019-07-11 | Discharge: 2019-07-11 | Disposition: A | Discharge status: Home / Self Care | Payer: No Typology Code available for payment source | Source: Ambulatory Visit | Attending: Radiation Oncology | Admitting: Radiation Oncology

## 2019-07-11 DIAGNOSIS — Z510 Encounter for antineoplastic radiation therapy: Secondary | ICD-10-CM | POA: Diagnosis not present

## 2019-07-14 ENCOUNTER — Ambulatory Visit
Admission: RE | Admit: 2019-07-14 | Discharge: 2019-07-14 | Disposition: A | Discharge status: Home / Self Care | Payer: No Typology Code available for payment source | Source: Ambulatory Visit | Attending: Radiation Oncology | Admitting: Radiation Oncology

## 2019-07-14 DIAGNOSIS — Z510 Encounter for antineoplastic radiation therapy: Secondary | ICD-10-CM | POA: Diagnosis not present

## 2019-07-15 ENCOUNTER — Ambulatory Visit
Admission: RE | Admit: 2019-07-15 | Discharge: 2019-07-15 | Disposition: A | Discharge status: Home / Self Care | Payer: No Typology Code available for payment source | Source: Ambulatory Visit | Attending: Radiation Oncology | Admitting: Radiation Oncology

## 2019-07-15 DIAGNOSIS — Z510 Encounter for antineoplastic radiation therapy: Secondary | ICD-10-CM | POA: Diagnosis not present

## 2019-07-16 ENCOUNTER — Ambulatory Visit
Admission: RE | Admit: 2019-07-16 | Discharge: 2019-07-16 | Disposition: A | Discharge status: Home / Self Care | Payer: No Typology Code available for payment source | Source: Ambulatory Visit | Attending: Radiation Oncology | Admitting: Radiation Oncology

## 2019-07-16 DIAGNOSIS — Z510 Encounter for antineoplastic radiation therapy: Secondary | ICD-10-CM | POA: Diagnosis not present

## 2019-07-17 ENCOUNTER — Ambulatory Visit
Admission: RE | Admit: 2019-07-17 | Discharge: 2019-07-17 | Disposition: A | Discharge status: Home / Self Care | Payer: No Typology Code available for payment source | Source: Ambulatory Visit | Attending: Radiation Oncology | Admitting: Radiation Oncology

## 2019-07-17 DIAGNOSIS — Z510 Encounter for antineoplastic radiation therapy: Secondary | ICD-10-CM | POA: Diagnosis not present

## 2019-07-18 ENCOUNTER — Ambulatory Visit
Admission: RE | Admit: 2019-07-18 | Discharge: 2019-07-18 | Disposition: A | Discharge status: Home / Self Care | Payer: No Typology Code available for payment source | Source: Ambulatory Visit | Attending: Radiation Oncology | Admitting: Radiation Oncology

## 2019-07-18 DIAGNOSIS — Z510 Encounter for antineoplastic radiation therapy: Secondary | ICD-10-CM | POA: Diagnosis not present

## 2019-07-22 ENCOUNTER — Ambulatory Visit
Admission: RE | Admit: 2019-07-22 | Discharge: 2019-07-22 | Disposition: A | Discharge status: Home / Self Care | Payer: No Typology Code available for payment source | Source: Ambulatory Visit | Attending: Radiation Oncology | Admitting: Radiation Oncology

## 2019-07-22 DIAGNOSIS — Z510 Encounter for antineoplastic radiation therapy: Secondary | ICD-10-CM | POA: Diagnosis not present

## 2019-07-22 DIAGNOSIS — C50911 Malignant neoplasm of unspecified site of right female breast: Secondary | ICD-10-CM | POA: Diagnosis not present

## 2019-07-22 DIAGNOSIS — Z170 Estrogen receptor positive status [ER+]: Secondary | ICD-10-CM | POA: Diagnosis not present

## 2019-07-22 DIAGNOSIS — C50811 Malignant neoplasm of overlapping sites of right female breast: Secondary | ICD-10-CM | POA: Insufficient documentation

## 2019-07-23 ENCOUNTER — Ambulatory Visit
Admission: RE | Admit: 2019-07-23 | Discharge: 2019-07-23 | Disposition: A | Discharge status: Home / Self Care | Payer: No Typology Code available for payment source | Source: Ambulatory Visit | Attending: Radiation Oncology | Admitting: Radiation Oncology

## 2019-07-23 ENCOUNTER — Inpatient Hospital Stay: Payer: No Typology Code available for payment source | Attending: Radiation Oncology

## 2019-07-23 ENCOUNTER — Other Ambulatory Visit: Payer: Self-pay

## 2019-07-23 DIAGNOSIS — Z510 Encounter for antineoplastic radiation therapy: Secondary | ICD-10-CM | POA: Insufficient documentation

## 2019-07-23 DIAGNOSIS — Z170 Estrogen receptor positive status [ER+]: Secondary | ICD-10-CM | POA: Insufficient documentation

## 2019-07-23 DIAGNOSIS — C50911 Malignant neoplasm of unspecified site of right female breast: Secondary | ICD-10-CM | POA: Insufficient documentation

## 2019-07-23 LAB — CBC
HCT: 35.7 % — ABNORMAL LOW (ref 36.0–46.0)
Hemoglobin: 11.6 g/dL — ABNORMAL LOW (ref 12.0–15.0)
MCH: 29 pg (ref 26.0–34.0)
MCHC: 32.5 g/dL (ref 30.0–36.0)
MCV: 89.3 fL (ref 80.0–100.0)
Platelets: 230 10*3/uL (ref 150–400)
RBC: 4 MIL/uL (ref 3.87–5.11)
RDW: 13 % (ref 11.5–15.5)
WBC: 6.2 10*3/uL (ref 4.0–10.5)
nRBC: 0 % (ref 0.0–0.2)

## 2019-07-24 ENCOUNTER — Ambulatory Visit
Admission: RE | Admit: 2019-07-24 | Discharge: 2019-07-24 | Disposition: A | Discharge status: Home / Self Care | Payer: No Typology Code available for payment source | Source: Ambulatory Visit | Attending: Radiation Oncology | Admitting: Radiation Oncology

## 2019-07-24 DIAGNOSIS — Z510 Encounter for antineoplastic radiation therapy: Secondary | ICD-10-CM | POA: Diagnosis not present

## 2019-07-25 ENCOUNTER — Ambulatory Visit
Admission: RE | Admit: 2019-07-25 | Discharge: 2019-07-25 | Disposition: A | Discharge status: Home / Self Care | Payer: No Typology Code available for payment source | Source: Ambulatory Visit | Attending: Radiation Oncology | Admitting: Radiation Oncology

## 2019-07-25 DIAGNOSIS — Z510 Encounter for antineoplastic radiation therapy: Secondary | ICD-10-CM | POA: Diagnosis not present

## 2019-07-28 ENCOUNTER — Ambulatory Visit
Admission: RE | Admit: 2019-07-28 | Discharge: 2019-07-28 | Disposition: A | Discharge status: Home / Self Care | Payer: No Typology Code available for payment source | Source: Ambulatory Visit | Attending: Radiation Oncology | Admitting: Radiation Oncology

## 2019-07-28 DIAGNOSIS — Z510 Encounter for antineoplastic radiation therapy: Secondary | ICD-10-CM | POA: Diagnosis not present

## 2019-07-29 ENCOUNTER — Ambulatory Visit
Admission: RE | Admit: 2019-07-29 | Discharge: 2019-07-29 | Disposition: A | Discharge status: Home / Self Care | Payer: No Typology Code available for payment source | Source: Ambulatory Visit | Attending: Radiation Oncology | Admitting: Radiation Oncology

## 2019-07-29 ENCOUNTER — Other Ambulatory Visit: Payer: Self-pay | Admitting: *Deleted

## 2019-07-29 DIAGNOSIS — Z510 Encounter for antineoplastic radiation therapy: Secondary | ICD-10-CM | POA: Diagnosis not present

## 2019-07-29 MED ORDER — SILVER SULFADIAZINE 1 % EX CREA: 1.0000 "application " | TOPICAL_CREAM | Freq: Every day | CUTANEOUS | 0 refills | Status: DC

## 2019-07-30 ENCOUNTER — Ambulatory Visit
Admission: RE | Admit: 2019-07-30 | Discharge: 2019-07-30 | Disposition: A | Discharge status: Home / Self Care | Payer: No Typology Code available for payment source | Source: Ambulatory Visit | Attending: Radiation Oncology | Admitting: Radiation Oncology

## 2019-07-30 DIAGNOSIS — Z510 Encounter for antineoplastic radiation therapy: Secondary | ICD-10-CM | POA: Diagnosis not present

## 2019-07-31 ENCOUNTER — Ambulatory Visit
Admission: RE | Admit: 2019-07-31 | Discharge: 2019-07-31 | Disposition: A | Discharge status: Home / Self Care | Payer: No Typology Code available for payment source | Source: Ambulatory Visit | Attending: Radiation Oncology | Admitting: Radiation Oncology

## 2019-07-31 DIAGNOSIS — Z510 Encounter for antineoplastic radiation therapy: Secondary | ICD-10-CM | POA: Diagnosis not present

## 2019-08-01 ENCOUNTER — Ambulatory Visit: Payer: No Typology Code available for payment source

## 2019-08-01 ENCOUNTER — Ambulatory Visit
Admission: RE | Admit: 2019-08-01 | Discharge: 2019-08-01 | Disposition: A | Discharge status: Home / Self Care | Payer: No Typology Code available for payment source | Source: Ambulatory Visit | Attending: Radiation Oncology | Admitting: Radiation Oncology

## 2019-08-01 DIAGNOSIS — Z510 Encounter for antineoplastic radiation therapy: Secondary | ICD-10-CM | POA: Diagnosis not present

## 2019-08-04 ENCOUNTER — Other Ambulatory Visit: Payer: Self-pay | Admitting: *Deleted

## 2019-08-04 ENCOUNTER — Ambulatory Visit
Admission: RE | Admit: 2019-08-04 | Discharge: 2019-08-04 | Disposition: A | Discharge status: Home / Self Care | Payer: No Typology Code available for payment source | Source: Ambulatory Visit | Attending: Radiation Oncology | Admitting: Radiation Oncology

## 2019-08-04 DIAGNOSIS — Z510 Encounter for antineoplastic radiation therapy: Secondary | ICD-10-CM | POA: Diagnosis not present

## 2019-08-04 MED ORDER — SILVER SULFADIAZINE 1 % EX CREA: 1.0000 "application " | TOPICAL_CREAM | Freq: Every day | CUTANEOUS | 2 refills | Status: DC

## 2019-08-05 ENCOUNTER — Ambulatory Visit
Admission: RE | Admit: 2019-08-05 | Discharge: 2019-08-05 | Disposition: A | Discharge status: Home / Self Care | Payer: No Typology Code available for payment source | Source: Ambulatory Visit | Attending: Radiation Oncology | Admitting: Radiation Oncology

## 2019-08-05 DIAGNOSIS — Z510 Encounter for antineoplastic radiation therapy: Secondary | ICD-10-CM | POA: Diagnosis not present

## 2019-08-06 ENCOUNTER — Other Ambulatory Visit: Payer: Self-pay

## 2019-08-06 ENCOUNTER — Inpatient Hospital Stay: Payer: No Typology Code available for payment source

## 2019-08-06 ENCOUNTER — Ambulatory Visit
Admission: RE | Admit: 2019-08-06 | Discharge: 2019-08-06 | Disposition: A | Discharge status: Home / Self Care | Payer: No Typology Code available for payment source | Source: Ambulatory Visit | Attending: Radiation Oncology | Admitting: Radiation Oncology

## 2019-08-06 DIAGNOSIS — Z510 Encounter for antineoplastic radiation therapy: Secondary | ICD-10-CM | POA: Diagnosis not present

## 2019-08-06 DIAGNOSIS — C50911 Malignant neoplasm of unspecified site of right female breast: Secondary | ICD-10-CM

## 2019-08-06 LAB — CBC
HCT: 37.6 % (ref 36.0–46.0)
Hemoglobin: 12.6 g/dL (ref 12.0–15.0)
MCH: 29.4 pg (ref 26.0–34.0)
MCHC: 33.5 g/dL (ref 30.0–36.0)
MCV: 87.6 fL (ref 80.0–100.0)
Platelets: 208 10*3/uL (ref 150–400)
RBC: 4.29 MIL/uL (ref 3.87–5.11)
RDW: 12.8 % (ref 11.5–15.5)
WBC: 5.5 10*3/uL (ref 4.0–10.5)
nRBC: 0 % (ref 0.0–0.2)

## 2019-08-07 ENCOUNTER — Encounter: Payer: Self-pay | Admitting: *Deleted

## 2019-08-07 ENCOUNTER — Ambulatory Visit
Admission: RE | Admit: 2019-08-07 | Discharge: 2019-08-07 | Disposition: A | Discharge status: Home / Self Care | Payer: No Typology Code available for payment source | Source: Ambulatory Visit | Attending: Radiation Oncology | Admitting: Radiation Oncology

## 2019-08-07 DIAGNOSIS — Z510 Encounter for antineoplastic radiation therapy: Secondary | ICD-10-CM | POA: Diagnosis not present

## 2019-08-08 ENCOUNTER — Ambulatory Visit: Payer: No Typology Code available for payment source

## 2019-08-11 ENCOUNTER — Ambulatory Visit
Admission: RE | Admit: 2019-08-11 | Discharge: 2019-08-11 | Disposition: A | Discharge status: Home / Self Care | Payer: No Typology Code available for payment source | Source: Ambulatory Visit | Attending: Radiation Oncology | Admitting: Radiation Oncology

## 2019-08-11 DIAGNOSIS — Z510 Encounter for antineoplastic radiation therapy: Secondary | ICD-10-CM | POA: Diagnosis not present

## 2019-08-12 ENCOUNTER — Ambulatory Visit
Admission: RE | Admit: 2019-08-12 | Discharge: 2019-08-12 | Disposition: A | Discharge status: Home / Self Care | Payer: No Typology Code available for payment source | Source: Ambulatory Visit | Attending: Radiation Oncology | Admitting: Radiation Oncology

## 2019-08-12 DIAGNOSIS — Z510 Encounter for antineoplastic radiation therapy: Secondary | ICD-10-CM | POA: Diagnosis not present

## 2019-08-13 ENCOUNTER — Ambulatory Visit
Admission: RE | Admit: 2019-08-13 | Discharge: 2019-08-13 | Disposition: A | Discharge status: Home / Self Care | Payer: No Typology Code available for payment source | Source: Ambulatory Visit | Attending: Radiation Oncology | Admitting: Radiation Oncology

## 2019-08-13 ENCOUNTER — Ambulatory Visit: Payer: No Typology Code available for payment source

## 2019-08-13 DIAGNOSIS — Z510 Encounter for antineoplastic radiation therapy: Secondary | ICD-10-CM | POA: Diagnosis not present

## 2019-08-14 ENCOUNTER — Ambulatory Visit
Admission: RE | Admit: 2019-08-14 | Discharge: 2019-08-14 | Disposition: A | Discharge status: Home / Self Care | Payer: No Typology Code available for payment source | Source: Ambulatory Visit | Attending: Radiation Oncology | Admitting: Radiation Oncology

## 2019-08-14 DIAGNOSIS — Z510 Encounter for antineoplastic radiation therapy: Secondary | ICD-10-CM | POA: Diagnosis not present

## 2019-08-19 ENCOUNTER — Ambulatory Visit
Admission: RE | Admit: 2019-08-19 | Discharge: 2019-08-19 | Disposition: A | Discharge status: Home / Self Care | Payer: No Typology Code available for payment source | Source: Ambulatory Visit | Attending: Oncology | Admitting: Oncology

## 2019-08-19 DIAGNOSIS — Z7189 Other specified counseling: Secondary | ICD-10-CM | POA: Insufficient documentation

## 2019-08-19 DIAGNOSIS — C50911 Malignant neoplasm of unspecified site of right female breast: Secondary | ICD-10-CM | POA: Diagnosis present

## 2019-08-19 DIAGNOSIS — Z170 Estrogen receptor positive status [ER+]: Secondary | ICD-10-CM | POA: Diagnosis present

## 2019-08-21 ENCOUNTER — Encounter: Payer: Self-pay | Admitting: *Deleted

## 2019-09-14 ENCOUNTER — Other Ambulatory Visit: Payer: Self-pay | Admitting: Oncology

## 2019-09-15 ENCOUNTER — Encounter: Payer: Self-pay | Admitting: Radiation Oncology

## 2019-09-15 ENCOUNTER — Other Ambulatory Visit: Payer: Self-pay

## 2019-09-15 ENCOUNTER — Ambulatory Visit
Admission: RE | Admit: 2019-09-15 | Discharge: 2019-09-15 | Disposition: A | Discharge status: Home / Self Care | Payer: No Typology Code available for payment source | Source: Ambulatory Visit | Attending: Radiation Oncology | Admitting: Radiation Oncology

## 2019-09-15 VITALS — BP 117/73 | HR 76 | Temp 95.4°F | Wt 276.2 lb

## 2019-09-15 DIAGNOSIS — Z170 Estrogen receptor positive status [ER+]: Secondary | ICD-10-CM

## 2019-09-15 NOTE — Progress Notes (Signed)
Radiation Oncology Follow up Note  Name: Nancy Patton   Date:   09/15/2019 MRN:  [id number] DOB: [birth year]    This 59 y.o. female presents to the clinic today for 1 month follow-up status post whole breast radiation to her right breast for stage Ia ER/PR positive invasive mammary carcinoma.  REFERRING PROVIDER: Perrin Maltese, MD  HPI: Patient is a 59 year old female now at 1 month having completed whole breast radiation to her right breast for ER/PR positive invasive mammary carcinoma stage Ia.  Seen today in routine follow-up she is doing well.  She specifically denies breast tenderness cough or bone pain..  She has been started on Femara and is tolerating that well without side effect.  COMPLICATIONS OF TREATMENT: none  FOLLOW UP COMPLIANCE: keeps appointments   PHYSICAL EXAM:  BP 117/73 (BP Location: Left Arm, Patient Position: Sitting, Cuff Size: Large)   Pulse 76   Temp (!) 95.4 F (35.2 C) (Tympanic)   Wt (!) 276 lb 4 oz (125.3 kg)   BMI 44.59 kg/m  Lungs are clear to A&P cardiac examination essentially unremarkable with regular rate and rhythm. No dominant mass or nodularity is noted in either breast in 2 positions examined. Incision is well-healed. No axillary or supraclavicular adenopathy is appreciated. Cosmetic result is excellent.  Still some slight hyperpigmentation of the right breast.  Well-developed well-nourished patient in NAD. HEENT reveals PERLA, EOMI, discs not visualized.  Oral cavity is clear. No oral mucosal lesions are identified. Neck is clear without evidence of cervical or supraclavicular adenopathy. Lungs are clear to A&P. Cardiac examination is essentially unremarkable with regular rate and rhythm without murmur rub or thrill. Abdomen is benign with no organomegaly or masses noted. Motor sensory and DTR levels are equal and symmetric in the upper and lower extremities. Cranial nerves II through XII are grossly intact. Proprioception is intact. No  peripheral adenopathy or edema is identified. No motor or sensory levels are noted. Crude visual fields are within normal range.  RADIOLOGY RESULTS: No current films to review  PLAN: Present time patient is doing well 1 month out from whole breast radiation and pleased with her overall progress.  She continues on Femara without side effect.  I have asked to see her back in 4 to 5 months for follow-up.  Patient knows to call with any concerns.  I would like to take this opportunity to thank you for allowing me to participate in the care of your patient.Noreene Filbert, MD

## 2019-10-07 ENCOUNTER — Inpatient Hospital Stay (HOSPITAL_BASED_OUTPATIENT_CLINIC_OR_DEPARTMENT_OTHER): Payer: No Typology Code available for payment source | Admitting: Oncology

## 2019-10-07 ENCOUNTER — Inpatient Hospital Stay: Payer: No Typology Code available for payment source | Attending: Oncology

## 2019-10-07 ENCOUNTER — Encounter: Payer: Self-pay | Admitting: Oncology

## 2019-10-07 ENCOUNTER — Other Ambulatory Visit: Payer: Self-pay

## 2019-10-07 VITALS — BP 120/70 | HR 63 | Temp 96.4°F | Resp 16 | Wt 273.2 lb

## 2019-10-07 DIAGNOSIS — Z853 Personal history of malignant neoplasm of breast: Secondary | ICD-10-CM

## 2019-10-07 DIAGNOSIS — C50911 Malignant neoplasm of unspecified site of right female breast: Secondary | ICD-10-CM | POA: Insufficient documentation

## 2019-10-07 DIAGNOSIS — Z833 Family history of diabetes mellitus: Secondary | ICD-10-CM | POA: Insufficient documentation

## 2019-10-07 DIAGNOSIS — Z79899 Other long term (current) drug therapy: Secondary | ICD-10-CM | POA: Diagnosis not present

## 2019-10-07 DIAGNOSIS — Z170 Estrogen receptor positive status [ER+]: Secondary | ICD-10-CM | POA: Insufficient documentation

## 2019-10-07 DIAGNOSIS — E669 Obesity, unspecified: Secondary | ICD-10-CM | POA: Insufficient documentation

## 2019-10-07 DIAGNOSIS — Z08 Encounter for follow-up examination after completed treatment for malignant neoplasm: Secondary | ICD-10-CM | POA: Diagnosis not present

## 2019-10-07 DIAGNOSIS — Z923 Personal history of irradiation: Secondary | ICD-10-CM | POA: Diagnosis not present

## 2019-10-07 DIAGNOSIS — G4730 Sleep apnea, unspecified: Secondary | ICD-10-CM | POA: Insufficient documentation

## 2019-10-07 DIAGNOSIS — Z7984 Long term (current) use of oral hypoglycemic drugs: Secondary | ICD-10-CM | POA: Insufficient documentation

## 2019-10-07 DIAGNOSIS — Z7951 Long term (current) use of inhaled steroids: Secondary | ICD-10-CM | POA: Insufficient documentation

## 2019-10-07 DIAGNOSIS — Z79811 Long term (current) use of aromatase inhibitors: Secondary | ICD-10-CM | POA: Diagnosis not present

## 2019-10-07 DIAGNOSIS — J45909 Unspecified asthma, uncomplicated: Secondary | ICD-10-CM | POA: Insufficient documentation

## 2019-10-07 DIAGNOSIS — Z87891 Personal history of nicotine dependence: Secondary | ICD-10-CM | POA: Diagnosis not present

## 2019-10-07 DIAGNOSIS — E119 Type 2 diabetes mellitus without complications: Secondary | ICD-10-CM | POA: Insufficient documentation

## 2019-10-07 DIAGNOSIS — M8580 Other specified disorders of bone density and structure, unspecified site: Secondary | ICD-10-CM | POA: Diagnosis not present

## 2019-10-07 DIAGNOSIS — Z8249 Family history of ischemic heart disease and other diseases of the circulatory system: Secondary | ICD-10-CM | POA: Insufficient documentation

## 2019-10-07 DIAGNOSIS — I10 Essential (primary) hypertension: Secondary | ICD-10-CM | POA: Diagnosis not present

## 2019-10-07 DIAGNOSIS — M85852 Other specified disorders of bone density and structure, left thigh: Secondary | ICD-10-CM

## 2019-10-07 LAB — COMPREHENSIVE METABOLIC PANEL
ALT: 23 U/L (ref 0–44)
AST: 21 U/L (ref 15–41)
Albumin: 4.2 g/dL (ref 3.5–5.0)
Alkaline Phosphatase: 50 U/L (ref 38–126)
Anion gap: 9 (ref 5–15)
BUN: 21 mg/dL — ABNORMAL HIGH (ref 6–20)
CO2: 28 mmol/L (ref 22–32)
Calcium: 9.2 mg/dL (ref 8.9–10.3)
Chloride: 103 mmol/L (ref 98–111)
Creatinine, Ser: 0.86 mg/dL (ref 0.44–1.00)
GFR calc Af Amer: >60 mL/min (ref >60)
GFR calc non Af Amer: >60 mL/min (ref >60)
Glucose, Bld: 115 mg/dL — ABNORMAL HIGH (ref 70–99)
Potassium: 4.4 mmol/L (ref 3.5–5.1)
Sodium: 140 mmol/L (ref 135–145)
Total Bilirubin: 0.6 mg/dL (ref 0.3–1.2)
Total Protein: 7.1 g/dL (ref 6.5–8.1)

## 2019-10-07 NOTE — Progress Notes (Signed)
Hematology/Oncology Consult note [facility]  Telephone:([phone] [phone]  Patient Care Team: Perrin Maltese, MD as PCP - General (Internal Medicine) Noreene Filbert, MD as Radiation Oncologist (Radiation Oncology)   Name of the patient: Nancy Patton  [id number]  1960-08-03   Date of visit: 10/07/19  Diagnosis- pathological prognostic stage IA pT1c pN0 cM0 ER positive PR positive HER-2 negative s/p lumpectomy  Chief complaint/ Reason for visit-routine follow-up of breast cancer on letrozole  Heme/Onc history:  Patient is a 59 year old female who recently underwent a screening mammogram which showed a possible mass in the right breast. This was followed by diagnostic mammogram and ultrasound which showed an irregular related mass 0.6 x 0.6 x 1 cm at the 9 o'clock position 6 cm from the nipple. This was biopsied and was consistent with invasive mammary carcinoma 7 mm grade ER 91 200% positive PR 1 to 10% positive and HER-2 negative. Patient has seen Dr. Christian Mate and plans to get a lumpectomy soon. She has a remote use of birth control pills. She is postmenopausal. Menarche at the age of 1. G3 P3 L3. No known prior breast biopsies. No family history of breast cancer, colon gastric or pancreatic cancer.  Final pathology showed invasive mammary carcinoma no special type. 45m, grade 2.  1 sentinel lymph node negative for malignancy.  Margins negative.  Oncotype score came back at 15 and patient did not require adjuvant chemotherapy.  Patient went on to finish adjuvant radiation treatment  Interval history-currently patient reports doing well after radiation.  Reports no overt soreness.  She is tolerating letrozole well without any significant side effects.  ECOG PS- 1 Pain scale- 0   Review of systems- Review of Systems  Constitutional: Negative for chills, fever, malaise/fatigue and weight loss.  HENT: Negative for congestion,  ear discharge and nosebleeds.   Eyes: Negative for blurred vision.  Respiratory: Negative for cough, hemoptysis, sputum production, shortness of breath and wheezing.   Cardiovascular: Negative for chest pain, palpitations, orthopnea and claudication.  Gastrointestinal: Negative for abdominal pain, blood in stool, constipation, diarrhea, heartburn, melena, nausea and vomiting.  Genitourinary: Negative for dysuria, flank pain, frequency, hematuria and urgency.  Musculoskeletal: Negative for back pain, joint pain and myalgias.  Skin: Negative for rash.  Neurological: Negative for dizziness, tingling, focal weakness, seizures, weakness and headaches.  Endo/Heme/Allergies: Does not bruise/bleed easily.  Psychiatric/Behavioral: Negative for depression and suicidal ideas. The patient does not have insomnia.       No Known Allergies   Past Medical History:  Diagnosis Date  . Arthritis   . Asthma   . Breast cancer ([place]   . Diabetes mellitus without complication ([place]   . Hypertension   . Obesity   . Pulmonary hypertension ([place]   . Sleep apnea      Past Surgical History:  Procedure Laterality Date  . CATARACT EXTRACTION    . CESAREAN SECTION     x 3     Social History   Socioeconomic History  . Marital status: Single    Spouse name: Not on file  . Number of children: Not on file  . Years of education: Not on file  . Highest education level: Not on file  Occupational History  . Not on file  Tobacco Use  . Smoking status: Former Smoker    Types: Cigarettes  . Smokeless tobacco: Never Used  . Tobacco comment: Quit smoking 30 years ago.  Vaping Use  . Vaping  Use: Never used  Substance and Sexual Activity  . Alcohol use: No  . Drug use: No  . Sexual activity: Not Currently  Other Topics Concern  . Not on file  Social History Narrative  . Not on file   Social Determinants of Health   Financial Resource Strain:   . Difficulty of Paying Living Expenses:   Food  Insecurity:   . Worried About Charity fundraiser in the Last Year:   . Arboriculturist in the Last Year:   Transportation Needs:   . Film/video editor (Medical):   Marland Kitchen Lack of Transportation (Non-Medical):   Physical Activity:   . Days of Exercise per Week:   . Minutes of Exercise per Session:   Stress:   . Feeling of Stress :   Social Connections:   . Frequency of Communication with Friends and Family:   . Frequency of Social Gatherings with Friends and Family:   . Attends Religious Services:   . Active Member of Clubs or Organizations:   . Attends Archivist Meetings:   Marland Kitchen Marital Status:   Intimate Partner Violence:   . Fear of Current or Ex-Partner:   . Emotionally Abused:   Marland Kitchen Physically Abused:   . Sexually Abused:     Family History  Problem Relation Age of Onset  . Diabetes Mother   . Stroke Mother   . Diabetes Father   . Diabetes Brother   . Cancer Neg Hx   . Heart failure Neg Hx   . Hypertension Neg Hx   . Breast cancer Neg Hx      Current Outpatient Medications:  .  albuterol (PROVENTIL HFA;VENTOLIN HFA) 108 (90 BASE) MCG/ACT inhaler, Inhale 4-6 puffs by mouth every 4 hours as needed for wheezing, cough, and/or shortness of breath (Patient taking differently: Inhale 2 puffs into the lungs every 4 (four) hours as needed for wheezing or shortness of breath. ), Disp: 1 Inhaler, Rfl: 1 .  budesonide-formoterol (SYMBICORT) 160-4.5 MCG/ACT inhaler, Inhale 2 puffs into the lungs 2 (two) times daily., Disp: , Rfl:  .  citalopram (CELEXA) 40 MG tablet, Take 40 mg by mouth daily. , Disp: , Rfl: 10 .  ergocalciferol (VITAMIN D2) 1.25 MG (50000 UT) capsule, Take 50,000 Units by mouth once a week., Disp: , Rfl:  .  letrozole (FEMARA) 2.5 MG tablet, TAKE 1 TABLET BY MOUTH EVERY DAY (DON'T START MED UNTIL YOU COMPLETE RADIATION 1ST), Disp: 30 tablet, Rfl: 3 .  losartan-hydrochlorothiazide (HYZAAR) 50-12.5 MG per tablet, Take 1 tablet by mouth daily., Disp: , Rfl:  5 .  metFORMIN (GLUCOPHAGE) 1000 MG tablet, Take 1,000 mg by mouth 2 (two) times daily., Disp: , Rfl: 2 .  montelukast (SINGULAIR) 10 MG tablet, Take 10 mg by mouth every morning. , Disp: , Rfl:  .  Omega-3 Fatty Acids (FISH OIL) 1000 MG CAPS, Take 1,000 mg by mouth in the morning and at bedtime., Disp: , Rfl:  .  silver sulfADIAZINE (SILVADENE) 1 % cream, Apply 1 application topically daily., Disp: 50 g, Rfl: 2 .  ibuprofen (ADVIL) 800 MG tablet, Take 1 tablet (800 mg total) by mouth every 8 (eight) hours as needed. (Patient not taking: Reported on 06/06/2019), Disp: 30 tablet, Rfl: 0  Physical exam:  Vitals:   10/07/19 0948  BP: 120/70  Pulse: 63  Resp: 16  Temp: (!) 96.4 F (35.8 C)  TempSrc: Tympanic  SpO2: 96%  Weight: 273 lb 3.2 oz (123.9 kg)  Physical Exam Constitutional:      General: She is not in acute distress. Cardiovascular:     Rate and Rhythm: Normal rate and regular rhythm.     Heart sounds: Normal heart sounds.  Pulmonary:     Effort: Pulmonary effort is normal.     Breath sounds: Normal breath sounds.  Abdominal:     General: Bowel sounds are normal.     Palpations: Abdomen is soft.  Skin:    General: Skin is warm and dry.  Neurological:     Mental Status: She is alert and oriented to person, place, and time.      CMP Latest Ref Rng & Units 10/07/2019  Glucose 70 - 99 mg/dL 115(H)  BUN 6 - 20 mg/dL 21(H)  Creatinine 0.44 - 1.00 mg/dL 0.86  Sodium 135 - 145 mmol/L 140  Potassium 3.5 - 5.1 mmol/L 4.4  Chloride 98 - 111 mmol/L 103  CO2 22 - 32 mmol/L 28  Calcium 8.9 - 10.3 mg/dL 9.2  Total Protein 6.5 - 8.1 g/dL 7.1  Total Bilirubin 0.3 - 1.2 mg/dL 0.6  Alkaline Phos 38 - 126 U/L 50  AST 15 - 41 U/L 21  ALT 0 - 44 U/L 23   CBC Latest Ref Rng & Units 08/06/2019  WBC 4.0 - 10.5 K/uL 5.5  Hemoglobin 12.0 - 15.0 g/dL 12.6  Hematocrit 36 - 46 % 37.6  Platelets 150 - 400 K/uL 208     Assessment and plan- Patient is a 59 y.o. female with invasive  mammary carcinoma of the right breast pathological prognostic stage I A pT1c pN0 ER/PR positive HER-2/neu negative.  She is s/p adjuvant radiation therapy and currently on letrozole.  She is here for routine follow-up  Patient is tolerating letrozole well without any significant side effects.  She is also taking her calcium and vitamin D.  She had a bone density scan in June 2021 which showed evidence of osteopenia that was not significant requiring adjuvant bisphosphonates.I will see her back in 3 months time for an in person or video visit.  No labs   Visit Diagnosis 1. Encounter for follow-up surveillance of breast cancer   2. Use of letrozole (Femara)   3. Osteopenia of neck of left femur      Dr. Randa Evens, MD, MPH [facility] [phone] 10/07/2019 12:36 PM

## 2019-10-10 ENCOUNTER — Other Ambulatory Visit: Payer: Self-pay | Admitting: Oncology

## 2019-11-27 ENCOUNTER — Ambulatory Visit: Payer: No Typology Code available for payment source | Admitting: Surgery

## 2020-01-08 ENCOUNTER — Encounter: Payer: Self-pay | Admitting: Surgery

## 2020-01-08 ENCOUNTER — Other Ambulatory Visit: Payer: Self-pay

## 2020-01-08 ENCOUNTER — Ambulatory Visit (INDEPENDENT_AMBULATORY_CARE_PROVIDER_SITE_OTHER): Payer: 59 | Admitting: Surgery

## 2020-01-08 VITALS — BP 118/77 | HR 73 | Temp 98.4°F | Ht 66.0 in | Wt 258.2 lb

## 2020-01-08 DIAGNOSIS — C50911 Malignant neoplasm of unspecified site of right female breast: Secondary | ICD-10-CM | POA: Diagnosis not present

## 2020-01-08 DIAGNOSIS — Z170 Estrogen receptor positive status [ER+]: Secondary | ICD-10-CM | POA: Diagnosis not present

## 2020-01-08 NOTE — Patient Instructions (Addendum)
The patient has been asked to return to the office in March with a bilateral diagnostic mammogram. We will send you out a letter at that time.   Continue self breast exams. Call office for any new breast issues or concerns.

## 2020-01-08 NOTE — Progress Notes (Signed)
Surgical Clinic Progress/Follow-up Note   HPI:  59 y.o. Female presents to clinic for right breast cancer with breast conservation management follow-up.  Her lumpectomy and sentinel lymph node was completed in April.  She has since undergone radiation treatment, and is currently taking letrozole.  She denies any problems with the antihormonal management.  She reports she has improved remarkably over some of the radiation changes that she tolerated.  She currently has no complaints or issues.  Review of Systems:  Constitutional: denies fever/chills  ENT: denies sore throat, hearing problems  Respiratory: denies shortness of breath, wheezing  Cardiovascular: denies chest pain, palpitations  Gastrointestinal: denies abdominal pain, N/V,  Skin: Denies any other rashes or skin discolorations except post-radiation right breast   Vital Signs:  BP 118/77   Pulse 73   Temp 98.4 F (36.9 C)   Ht 5\' 6"  (1.676 m)   Wt 258 lb 3.2 oz (117.1 kg)   SpO2 93%   BMI 41.67 kg/m    Physical Exam:  Constitutional:  -- Obese body habitus  -- Awake, alert, and oriented x3  Pulmonary:  -- No crackles -- Equal breath sounds bilaterally -- Breathing non-labored at rest Cardiovascular:  -- S1, S2 present  -- No pericardial rubs  Gastrointestinal:  -- Soft and non-distended, non-tender GU  --right breast exam notable for post radiation changes, no acute erythema.  Otherwise both breasts are soft and supple without any suspicious nodularity or dominant masses. Musculoskeletal / Integumentary:  -- Wounds or skin discoloration: None appreciated  -- Extremities: B/L UE and LE FROM, hands and feet warm, no edema   Imaging: No new pertinent imaging available for review   Assessment:  59 y.o. yo Female with a problem list including...  Patient Active Problem List   Diagnosis Date Noted  . Status post breast lumpectomy 05/29/2019  . Cancer of right breast, stage 1, estrogen receptor positive ([place])  05/15/2019  . COPD exacerbation ([place]) 08/24/2015  . HTN (hypertension) 07/10/2015  . Type 2 diabetes mellitus ([place]) 07/10/2015  . Depression 07/10/2015  . Acute respiratory distress 09/26/2014  . Asthmatic bronchitis with acute exacerbation 09/26/2014  . Asthmatic bronchitis with exacerbation 09/26/2014    presents to clinic for follow-up evaluation of post right breast conservation treatment, completed radiation treatment and currently on antihormonal prophylaxis., progressing well.  Plan:              - return to clinic in March with follow-up screening mammography/diagnostic mammography or as needed, instructed to call office if any questions or concerns  All of the above recommendations were discussed with the patient, and all of patient's questions were answered to her expressed satisfaction.  Ronny Bacon, MD, FACS [place]: [facility]. Office: [phone]

## 2020-01-09 ENCOUNTER — Telehealth: Payer: No Typology Code available for payment source | Admitting: Oncology

## 2020-01-13 ENCOUNTER — Inpatient Hospital Stay: Payer: 59 | Attending: Oncology | Admitting: Oncology

## 2020-01-13 DIAGNOSIS — Z79811 Long term (current) use of aromatase inhibitors: Secondary | ICD-10-CM | POA: Diagnosis not present

## 2020-01-13 DIAGNOSIS — Z853 Personal history of malignant neoplasm of breast: Secondary | ICD-10-CM | POA: Diagnosis not present

## 2020-01-13 DIAGNOSIS — C50911 Malignant neoplasm of unspecified site of right female breast: Secondary | ICD-10-CM

## 2020-01-13 DIAGNOSIS — Z08 Encounter for follow-up examination after completed treatment for malignant neoplasm: Secondary | ICD-10-CM

## 2020-01-19 NOTE — Progress Notes (Signed)
I connected with Nancy Patton on 01/19/20 at  2:45 PM EST by video enabled telemedicine visit and verified that I am speaking with the correct person using two identifiers.   I discussed the limitations, risks, security and privacy concerns of performing an evaluation and management service by telemedicine and the availability of in-person appointments. I also discussed with the patient that there may be a patient responsible charge related to this service. The patient expressed understanding and agreed to proceed.  Other persons participating in the visit and their role in the encounter:  none  Patient's location:  home Provider's location:  work  Diagnosis- pathological prognostic stage IA pT1c pN0 cM0 ER positive PR positive HER-2 negative s/p lumpectomy  Chief Complaint: Routine follow-up of breast cancer  History of present illness: Patient is a 59 year old female who recently underwent a screening mammogram which showed a possible mass in the right breast. This was followed by diagnostic mammogram and ultrasound which showed an irregular related mass 0.6 x 0.6 x 1 cm at the 9 o'clock position 6 cm from the nipple. This was biopsied and was consistent with invasive mammary carcinoma 7 mm grade ER 91 200% positive PR 1 to 10% positive and HER-2 negative. Patient has seen Dr. Christian Mate and plans to get a lumpectomy soon. She has a remote use of birth control pills. She is postmenopausal. Menarche at the age of 25. G3 P3 L3. No known prior breast biopsies. No family history of breast cancer, colon gastric or pancreatic cancer.  Final pathology showed invasive mammary carcinoma no special type. 24m, grade 2. 1 sentinel lymph node negative for malignancy. Margins negative.  Oncotype score came back at 15 and patient did not require adjuvant chemotherapy.  Patient went on to finish adjuvant radiation treatment.  Letrozole started in August 2021   Interval history reports  doing well on letrozole.  Denies any complaints at this time.  Denies any significant hot flashes or joint pains   Review of Systems  Constitutional: Negative for chills, fever, malaise/fatigue and weight loss.  HENT: Negative for congestion, ear discharge and nosebleeds.   Eyes: Negative for blurred vision.  Respiratory: Negative for cough, hemoptysis, sputum production, shortness of breath and wheezing.   Cardiovascular: Negative for chest pain, palpitations, orthopnea and claudication.  Gastrointestinal: Negative for abdominal pain, blood in stool, constipation, diarrhea, heartburn, melena, nausea and vomiting.  Genitourinary: Negative for dysuria, flank pain, frequency, hematuria and urgency.  Musculoskeletal: Negative for back pain, joint pain and myalgias.  Skin: Negative for rash.  Neurological: Negative for dizziness, tingling, focal weakness, seizures, weakness and headaches.  Endo/Heme/Allergies: Does not bruise/bleed easily.  Psychiatric/Behavioral: Negative for depression and suicidal ideas. The patient does not have insomnia.     No Known Allergies  Past Medical History:  Diagnosis Date  . Arthritis   . Asthma   . Breast cancer ([facility]   . Diabetes mellitus without complication ([place]   . Hypertension   . Obesity   . Pulmonary hypertension ([place]   . Sleep apnea     Past Surgical History:  Procedure Laterality Date  . CATARACT EXTRACTION    . CESAREAN SECTION     x 3     Social History   Socioeconomic History  . Marital status: Single    Spouse name: Not on file  . Number of children: Not on file  . Years of education: Not on file  . Highest education level: Not on file  Occupational History  .  Not on file  Tobacco Use  . Smoking status: Former Smoker    Types: Cigarettes  . Smokeless tobacco: Never Used  . Tobacco comment: Quit smoking 30 years ago.  Vaping Use  . Vaping Use: Never used  Substance and Sexual Activity  . Alcohol use: No  . Drug use:  No  . Sexual activity: Not Currently  Other Topics Concern  . Not on file  Social History Narrative  . Not on file   Social Determinants of Health   Financial Resource Strain:   . Difficulty of Paying Living Expenses: Not on file  Food Insecurity:   . Worried About Charity fundraiser in the Last Year: Not on file  . Ran Out of Food in the Last Year: Not on file  Transportation Needs:   . Lack of Transportation (Medical): Not on file  . Lack of Transportation (Non-Medical): Not on file  Physical Activity:   . Days of Exercise per Week: Not on file  . Minutes of Exercise per Session: Not on file  Stress:   . Feeling of Stress : Not on file  Social Connections:   . Frequency of Communication with Friends and Family: Not on file  . Frequency of Social Gatherings with Friends and Family: Not on file  . Attends Religious Services: Not on file  . Active Member of Clubs or Organizations: Not on file  . Attends Archivist Meetings: Not on file  . Marital Status: Not on file  Intimate Partner Violence:   . Fear of Current or Ex-Partner: Not on file  . Emotionally Abused: Not on file  . Physically Abused: Not on file  . Sexually Abused: Not on file    Family History  Problem Relation Age of Onset  . Diabetes Mother   . Stroke Mother   . Diabetes Father   . Diabetes Brother   . Cancer Neg Hx   . Heart failure Neg Hx   . Hypertension Neg Hx   . Breast cancer Neg Hx      Current Outpatient Medications:  .  albuterol (PROVENTIL HFA;VENTOLIN HFA) 108 (90 BASE) MCG/ACT inhaler, Inhale 4-6 puffs by mouth every 4 hours as needed for wheezing, cough, and/or shortness of breath (Patient taking differently: Inhale 2 puffs into the lungs every 4 (four) hours as needed for wheezing or shortness of breath. ), Disp: 1 Inhaler, Rfl: 1 .  budesonide-formoterol (SYMBICORT) 160-4.5 MCG/ACT inhaler, Inhale 2 puffs into the lungs 2 (two) times daily., Disp: , Rfl:  .  calcium elemental  as carbonate (CALCIUM ANTACID ULTRA) 400 MG chewable tablet, Chew 1,000 mg by mouth 3 (three) times daily., Disp: , Rfl:  .  citalopram (CELEXA) 40 MG tablet, Take 40 mg by mouth daily. , Disp: , Rfl: 10 .  D3-50 1.25 MG (50000 UT) capsule, Take by mouth once a week., Disp: , Rfl:  .  ergocalciferol (VITAMIN D2) 1.25 MG (50000 UT) capsule, Take 50,000 Units by mouth once a week., Disp: , Rfl:  .  ipratropium-albuterol (DUONEB) 0.5-2.5 (3) MG/3ML SOLN, Take by nebulization 3 (three) times daily as needed., Disp: , Rfl:  .  letrozole (FEMARA) 2.5 MG tablet, TAKE 1 TABLET BY MOUTH EVERY DAY (DON'T START MED UNTIL YOU COMPLETE RADIATION 1ST), Disp: 90 tablet, Rfl: 2 .  losartan-hydrochlorothiazide (HYZAAR) 50-12.5 MG per tablet, Take 1 tablet by mouth daily., Disp: , Rfl: 5 .  metFORMIN (GLUCOPHAGE) 1000 MG tablet, Take 1,000 mg by mouth 2 (two)  times daily., Disp: , Rfl: 2 .  montelukast (SINGULAIR) 10 MG tablet, Take 10 mg by mouth every morning. , Disp: , Rfl:  .  Omega-3 Fatty Acids (FISH OIL) 1000 MG CAPS, Take 1,000 mg by mouth in the morning and at bedtime., Disp: , Rfl:  .  pantoprazole (PROTONIX) 40 MG tablet, Take 40 mg by mouth daily., Disp: , Rfl:  .  rosuvastatin (CRESTOR) 5 MG tablet, Take 5 mg by mouth daily., Disp: , Rfl:   No results found.  No images are attached to the encounter.   CMP Latest Ref Rng & Units 10/07/2019  Glucose 70 - 99 mg/dL 115(H)  BUN 6 - 20 mg/dL 21(H)  Creatinine 0.44 - 1.00 mg/dL 0.86  Sodium 135 - 145 mmol/L 140  Potassium 3.5 - 5.1 mmol/L 4.4  Chloride 98 - 111 mmol/L 103  CO2 22 - 32 mmol/L 28  Calcium 8.9 - 10.3 mg/dL 9.2  Total Protein 6.5 - 8.1 g/dL 7.1  Total Bilirubin 0.3 - 1.2 mg/dL 0.6  Alkaline Phos 38 - 126 U/L 50  AST 15 - 41 U/L 21  ALT 0 - 44 U/L 23   CBC Latest Ref Rng & Units 08/06/2019  WBC 4.0 - 10.5 K/uL 5.5  Hemoglobin 12.0 - 15.0 g/dL 12.6  Hematocrit 36 - 46 % 37.6  Platelets 150 - 400 K/uL 208      Observation/objective: Appears in no acute distress over video visit today.  Breathing is nonlabored  Assessment and plan: Patient is a 59 year old female with invasive mammary carcinoma of the right breast pathological prognostic stage I APT1CPN0 ER/PR positive HER-2 negative s/p lumpectomy adjuvant radiation therapy and currently on letrozole.  This is a routine follow-up visit  Patient seems to be doing well on letrozole without any significant side effects.  She would be due for a repeat mammogram in February2022 which I will schedule.  She will continue to take letrozole along with calcium and vitamin D.  I will see her back in 4 months in person  Follow-up instructions: As above  I discussed the assessment and treatment plan with the patient. The patient was provided an opportunity to ask questions and all were answered. The patient agreed with the plan and demonstrated an understanding of the instructions.   The patient was advised to call back or seek an in-person evaluation if the symptoms worsen or if the condition fails to improve as anticipated.    Visit Diagnosis: 1. Encounter for follow-up surveillance of breast cancer   2. Use of letrozole (Femara)     Dr. Randa Evens, MD, MPH [facility] Tel- [phone] 01/19/2020 1:35 PM

## 2020-02-25 ENCOUNTER — Other Ambulatory Visit: Payer: Self-pay

## 2020-02-25 ENCOUNTER — Ambulatory Visit
Admission: RE | Admit: 2020-02-25 | Discharge: 2020-02-25 | Disposition: A | Discharge status: Home / Self Care | Payer: Managed Care, Other (non HMO) | Source: Ambulatory Visit | Attending: Radiation Oncology | Admitting: Radiation Oncology

## 2020-02-25 VITALS — BP 115/81 | HR 61 | Temp 98.0°F | Resp 16 | Wt 251.0 lb

## 2020-02-25 DIAGNOSIS — C50011 Malignant neoplasm of nipple and areola, right female breast: Secondary | ICD-10-CM | POA: Insufficient documentation

## 2020-02-25 DIAGNOSIS — Z79811 Long term (current) use of aromatase inhibitors: Secondary | ICD-10-CM | POA: Insufficient documentation

## 2020-02-25 DIAGNOSIS — Z170 Estrogen receptor positive status [ER+]: Secondary | ICD-10-CM | POA: Diagnosis not present

## 2020-02-25 DIAGNOSIS — Z923 Personal history of irradiation: Secondary | ICD-10-CM | POA: Insufficient documentation

## 2020-02-25 DIAGNOSIS — C50211 Malignant neoplasm of upper-inner quadrant of right female breast: Secondary | ICD-10-CM

## 2020-02-25 NOTE — Progress Notes (Signed)
Radiation Oncology Follow up Note  Name: Nancy Patton   Date:   02/25/2020 MRN:  [id number] DOB: October 05, 1960    This 60 y.o. female presents to the clinic today for 31-month follow-up status post whole breast radiation to her right breast for stage Ia ER/PR positive invasive mammary carcinoma.  REFERRING PROVIDER: Margaretann Loveless, MD  HPI: Patient is a 60 year old female now out 6 months having completed whole breast radiation to her right breast for stage Ia ER/PR positive invasive mammary carcinoma seen today in routine follow-up she is doing well.  She specifically denies breast tenderness cough or bone pain..  She is not yet had follow-up mammograms although they have been ordered.  She is currently on letrozole tolerating it well without side effect.  COMPLICATIONS OF TREATMENT: none  FOLLOW UP COMPLIANCE: keeps appointments   PHYSICAL EXAM:  BP 115/81 (BP Location: Right Wrist, Patient Position: Sitting, Cuff Size: Small)   Pulse 61   Temp 98 F (36.7 C) (Tympanic)   Resp 16   Wt 251 lb (113.9 kg)   BMI 40.51 kg/m  Lungs are clear to A&P cardiac examination essentially unremarkable with regular rate and rhythm. No dominant mass or nodularity is noted in either breast in 2 positions examined. Incision is well-healed. No axillary or supraclavicular adenopathy is appreciated. Cosmetic result is excellent.  Well-developed well-nourished patient in NAD. HEENT reveals PERLA, EOMI, discs not visualized.  Oral cavity is clear. No oral mucosal lesions are identified. Neck is clear without evidence of cervical or supraclavicular adenopathy. Lungs are clear to A&P. Cardiac examination is essentially unremarkable with regular rate and rhythm without murmur rub or thrill. Abdomen is benign with no organomegaly or masses noted. Motor sensory and DTR levels are equal and symmetric in the upper and lower extremities. Cranial nerves II through XII are grossly intact. Proprioception is intact. No  peripheral adenopathy or edema is identified. No motor or sensory levels are noted. Crude visual fields are within normal range.  RADIOLOGY RESULTS: No current films to review  PLAN: Present time patient is doing well 6 months out with no evidence of disease.  I am pleased with her overall progress.  She continues on letrozole without side effect.  Of asked to see her back in 6 months for follow-up.  She has had mammograms already ordered I will review them when they become available.  Patient knows to call with any concerns.  I would like to take this opportunity to thank you for allowing me to participate in the care of your patient.Carmina Miller, MD

## 2020-04-05 ENCOUNTER — Other Ambulatory Visit: Payer: Self-pay

## 2020-04-05 DIAGNOSIS — C50911 Malignant neoplasm of unspecified site of right female breast: Secondary | ICD-10-CM

## 2020-04-28 ENCOUNTER — Ambulatory Visit
Admission: RE | Admit: 2020-04-28 | Discharge: 2020-04-28 | Disposition: A | Discharge status: Home / Self Care | Payer: Managed Care, Other (non HMO) | Source: Ambulatory Visit | Attending: Surgery | Admitting: Surgery

## 2020-04-28 ENCOUNTER — Other Ambulatory Visit: Payer: Self-pay

## 2020-04-28 DIAGNOSIS — Z170 Estrogen receptor positive status [ER+]: Secondary | ICD-10-CM | POA: Diagnosis present

## 2020-04-28 DIAGNOSIS — C50911 Malignant neoplasm of unspecified site of right female breast: Secondary | ICD-10-CM

## 2020-05-06 ENCOUNTER — Other Ambulatory Visit: Payer: Self-pay

## 2020-05-06 ENCOUNTER — Encounter: Payer: Self-pay | Admitting: Surgery

## 2020-05-06 ENCOUNTER — Ambulatory Visit: Payer: Managed Care, Other (non HMO) | Admitting: Surgery

## 2020-05-06 VITALS — BP 128/81 | HR 80 | Temp 99.1°F | Ht 66.0 in | Wt 244.6 lb

## 2020-05-06 DIAGNOSIS — C50911 Malignant neoplasm of unspecified site of right female breast: Secondary | ICD-10-CM

## 2020-05-06 DIAGNOSIS — Z170 Estrogen receptor positive status [ER+]: Secondary | ICD-10-CM

## 2020-05-06 NOTE — Progress Notes (Signed)
Surgical Clinic Progress/Follow-up Note   HPI:  60 y.o. Female presents to clinic for right breast cancer follow-up, she completed her course of radiation, and continues taking her Femara.  She now presents with follow-up mammographic imaging. She denies any breast pain, skin changes, new or suspicious nodularity or masses.  She reports having no symptoms secondary to taking her Femara and is maintaining calcium and D3 supplementation  Review of Systems:  Constitutional: denies fever/chills  ENT: denies sore throat, hearing problems  Respiratory: denies shortness of breath, wheezing  Cardiovascular: denies chest pain, palpitations  Gastrointestinal: denies abdominal pain, N/V, or diarrhea Skin: Denies any other rashes or skin discolorations  Vital Signs:  BP 128/81   Pulse 80   Temp 99.1 F (37.3 C) (Oral)   Ht 5\' 6"  (1.676 m)   Wt 244 lb 9.6 oz (110.9 kg)   BMI 39.48 kg/m    Physical Exam:  Constitutional:  -- Obese body habitus  -- Awake, alert, and oriented x3  Pulmonary:  -- No crackles -- Equal breath sounds bilaterally -- Breathing non-labored at rest Cardiovascular:  -- S1, S2 present  -- No pericardial rubs  Gastrointestinal:  -- Soft and non-distended, non-tender GU  --bilateral breasts remain pendulous, slightly asymmetric secondary to radiation changes in the right breast.  Skin changes are continuing to diminish on the right side.  There is no some suspicious, nor dominant nodularity or masses present in either breast. Musculoskeletal / Integumentary:  -- Wounds or skin discoloration: None appreciated -- Extremities: B/L UE and LE FROM, hands and feet warm   Laboratory studies: Nothing pertinent.  Imaging:  CLINICAL DATA:  RIGHT lumpectomy 2021  EXAM: DIGITAL DIAGNOSTIC BILATERAL MAMMOGRAM WITH TOMOSYNTHESIS AND CAD  TECHNIQUE: Bilateral digital diagnostic mammography and breast tomosynthesis was performed. The images were evaluated with  computer-aided detection.  COMPARISON:  Previous exam(s).  ACR Breast Density Category b: There are scattered areas of fibroglandular density.  FINDINGS: There is density and architectural distortion within the RIGHT breast, consistent with postsurgical changes. These are new in comparison to prior. No suspicious mass, distortion, or microcalcifications are identified to suggest presence of malignancy.  IMPRESSION: No mammographic evidence of malignancy.  RECOMMENDATION: Diagnostic mammogram is suggested in 1 year. (Code:DM-B-01Y)  I have discussed the findings and recommendations with the patient. If applicable, a reminder letter will be sent to the patient regarding the next appointment.  BI-RADS CATEGORY  2: Benign.   Electronically Signed   By: Valentino Saxon MD   On: 04/28/2020 14:41   Assessment:  60 y.o. yo Female with a problem list including...  Patient Active Problem List   Diagnosis Date Noted  . Status post breast lumpectomy 05/29/2019  . Cancer of right breast, stage 1, estrogen receptor positive ([place]) 05/15/2019  . COPD exacerbation ([place]) 08/24/2015  . HTN (hypertension) 07/10/2015  . Type 2 diabetes mellitus ([place]) 07/10/2015  . Depression 07/10/2015  . Mild intermittent asthma without complication 50/27/7412  . Acute respiratory distress 09/26/2014  . Asthmatic bronchitis with acute exacerbation 09/26/2014  . Asthmatic bronchitis with exacerbation 09/26/2014    presents to clinic for follow-up evaluation of right breast cancer, progressing well.  Plan:              - return to clinic in 1 year with follow-up screening imaging or as needed, instructed to call office if any questions or concerns  All of the above recommendations were discussed with the patient, and all of patient's questions were answered to  her expressed satisfaction.  Ronny Bacon, MD, FACS [place]: [facility]  for exceptional care. Office: [phone]

## 2020-05-06 NOTE — Patient Instructions (Addendum)
Follow up with [place] in 1 year after yearly Mammogram. If you have any concerns or questions, please feel free to call our office.     Breast Self-Awareness Breast self-awareness is knowing how your breasts look and feel. Doing breast self-awareness is important. It allows you to catch a breast problem early while it is still small and can be treated. All women should do breast self-awareness, including women who have had breast implants. Tell your doctor if you notice a change in your breasts. What you need:  A mirror.  A well-lit room. How to do a breast self-exam A breast self-exam is one way to learn what is normal for your breasts and to check for changes. To do a breast self-exam: Look for changes 1. Take off all the clothes above your waist. 2. Stand in front of a mirror in a room with good lighting. 3. Put your hands on your hips. 4. Push your hands down. 5. Look at your breasts and nipples in the mirror to see if one breast or nipple looks different from the other. Check to see if: ? The shape of one breast is different. ? The size of one breast is different. ? There are wrinkles, dips, and bumps in one breast and not the other. 6. Look at each breast for changes in the skin, such as: ? Redness. ? Scaly areas. 7. Look for changes in your nipples, such as: ? Liquid around the nipples. ? Bleeding. ? Dimpling. ? Redness. ? A change in where the nipples are.   Feel for changes 1. Lie on your back on the floor. 2. Feel each breast. To do this, follow these steps: ? Pick a breast to feel. ? Put the arm closest to that breast above your head. ? Use your other arm to feel the nipple area of your breast. Feel the area with the pads of your three middle fingers by making small circles with your fingers. For the first circle, press lightly. For the second circle, press harder. For the third circle, press even harder. ? Keep making circles with your fingers at the different pressures  as you move down your breast. Stop when you feel your ribs. ? Move your fingers a little toward the center of your body. ? Start making circles with your fingers again, this time going up until you reach your collarbone. ? Keep making up-and-down circles until you reach your armpit. Remember to keep using the three pressures. ? Feel the other breast in the same way. 3. Sit or stand in the tub or shower. 4. With soapy water on your skin, feel each breast the same way you did in step 2 when you were lying on the floor.   Write down what you find Writing down what you find can help you remember what to tell your doctor. Write down:  What is normal for each breast.  Any changes you find in each breast, including: ? The kind of changes you find. ? Whether you have pain. ? Size and location of any lumps.  When you last had your menstrual period. General tips  Check your breasts every month.  If you are breastfeeding, the best time to check your breasts is after you feed your baby or after you use a breast pump.  If you get menstrual periods, the best time to check your breasts is 5-7 days after your menstrual period is over.  With time, you will become comfortable with the self-exam, and  you will begin to know if there are changes in your breasts. Contact a doctor if you:  See a change in the shape or size of your breasts or nipples.  See a change in the skin of your breast or nipples, such as red or scaly skin.  Have fluid coming from your nipples that is not normal.  Find a lump or thick area that was not there before.  Have pain in your breasts.  Have any concerns about your breast health. Summary  Breast self-awareness includes looking for changes in your breasts, as well as feeling for changes within your breasts.  Breast self-awareness should be done in front of a mirror in a well-lit room.  You should check your breasts every month. If you get menstrual periods, the  best time to check your breasts is 5-7 days after your menstrual period is over.  Let your doctor know of any changes you see in your breasts, including changes in size, changes on the skin, pain or tenderness, or fluid from your nipples that is not normal. This information is not intended to replace advice given to you by your health care provider. Make sure you discuss any questions you have with your health care provider. Document Revised: 09/25/2017 Document Reviewed: 09/25/2017 Elsevier Patient Education  [place].

## 2020-05-13 ENCOUNTER — Inpatient Hospital Stay: Payer: Managed Care, Other (non HMO) | Attending: Oncology | Admitting: Oncology

## 2020-05-13 ENCOUNTER — Encounter: Payer: Self-pay | Admitting: Oncology

## 2020-05-13 ENCOUNTER — Other Ambulatory Visit: Payer: Self-pay

## 2020-05-13 VITALS — HR 69 | Temp 97.2°F | Resp 18 | Wt 244.0 lb

## 2020-05-13 DIAGNOSIS — Z7951 Long term (current) use of inhaled steroids: Secondary | ICD-10-CM | POA: Diagnosis not present

## 2020-05-13 DIAGNOSIS — Z87891 Personal history of nicotine dependence: Secondary | ICD-10-CM | POA: Diagnosis not present

## 2020-05-13 DIAGNOSIS — Z08 Encounter for follow-up examination after completed treatment for malignant neoplasm: Secondary | ICD-10-CM | POA: Diagnosis not present

## 2020-05-13 DIAGNOSIS — Z7984 Long term (current) use of oral hypoglycemic drugs: Secondary | ICD-10-CM | POA: Insufficient documentation

## 2020-05-13 DIAGNOSIS — J45909 Unspecified asthma, uncomplicated: Secondary | ICD-10-CM | POA: Diagnosis not present

## 2020-05-13 DIAGNOSIS — Z8249 Family history of ischemic heart disease and other diseases of the circulatory system: Secondary | ICD-10-CM | POA: Diagnosis not present

## 2020-05-13 DIAGNOSIS — Z79899 Other long term (current) drug therapy: Secondary | ICD-10-CM | POA: Diagnosis not present

## 2020-05-13 DIAGNOSIS — Z79811 Long term (current) use of aromatase inhibitors: Secondary | ICD-10-CM

## 2020-05-13 DIAGNOSIS — C50911 Malignant neoplasm of unspecified site of right female breast: Secondary | ICD-10-CM

## 2020-05-13 DIAGNOSIS — E119 Type 2 diabetes mellitus without complications: Secondary | ICD-10-CM | POA: Insufficient documentation

## 2020-05-13 DIAGNOSIS — Z803 Family history of malignant neoplasm of breast: Secondary | ICD-10-CM | POA: Diagnosis not present

## 2020-05-13 DIAGNOSIS — Z923 Personal history of irradiation: Secondary | ICD-10-CM | POA: Diagnosis not present

## 2020-05-13 DIAGNOSIS — I10 Essential (primary) hypertension: Secondary | ICD-10-CM | POA: Insufficient documentation

## 2020-05-13 DIAGNOSIS — Z170 Estrogen receptor positive status [ER+]: Secondary | ICD-10-CM | POA: Diagnosis not present

## 2020-05-13 DIAGNOSIS — Z853 Personal history of malignant neoplasm of breast: Secondary | ICD-10-CM

## 2020-05-13 NOTE — Progress Notes (Signed)
Hematology/Oncology Consult note [facility]  Telephone:([phone] [phone]  Patient Care Team: Perrin Maltese, MD as PCP - General (Internal Medicine) Ronny Bacon, MD as Consulting Physician (General Surgery) Rico Junker, RN as Registered Nurse Noreene Filbert, MD as Radiation Oncologist (Radiation Oncology) Sindy Guadeloupe, MD as Consulting Physician (Oncology)   Name of the patient: Nancy Patton  [id number]  [birth year]   Date of visit: 05/13/20  Diagnosis- pathological prognostic stage IA pT1c pN0 cM0 ER positive PR positive HER-2 negative s/p lumpectomy   Chief complaint/ Reason for visit-routine follow-up of breast cancer on letrozole  Heme/Onc history: Patient is a60 year old female who recently underwent a screening mammogram which showed a possible mass in the right breast. This was followed by diagnostic mammogram and ultrasound which showed an irregular related mass 0.6 x 0.6 x 1 cm at the 9 o'clock position 6 cm from the nipple. This was biopsied and was consistent with invasive mammary carcinoma 7 mm grade ER 91 200% positive PR 1 to 10% positive and HER-2 negative. Patient has seen Dr. Christian Mate and plans to get a lumpectomy soon. She has a remote use of birth control pills. She is postmenopausal. Menarche at the age of 1. G3 P3 L3. No known prior breast biopsies. No family history of breast cancer, colon gastric or pancreatic cancer.  Final pathology showed invasive mammary carcinoma no special type. 27m, grade 2. 1 sentinel lymph node negative for malignancy. Margins negative.Oncotype score came back at 15 and patient did not require adjuvant chemotherapy. Patient went on to finish adjuvant radiation treatment.  Letrozole started in August 2021  Interval history-patient reports tolerating letrozole well without any significant side effects.  She is taking weekly 50,000 units of vitamin D prescribed  by her PCP.  She is also on calcium supplements.  Reports no breast concerns at this time ECOG PS- 1 Pain scale- 0   Review of systems- Review of Systems  Constitutional: Negative for chills, fever, malaise/fatigue and weight loss.  HENT: Negative for congestion, ear discharge and nosebleeds.   Eyes: Negative for blurred vision.  Respiratory: Negative for cough, hemoptysis, sputum production, shortness of breath and wheezing.   Cardiovascular: Negative for chest pain, palpitations, orthopnea and claudication.  Gastrointestinal: Negative for abdominal pain, blood in stool, constipation, diarrhea, heartburn, melena, nausea and vomiting.  Genitourinary: Negative for dysuria, flank pain, frequency, hematuria and urgency.  Musculoskeletal: Negative for back pain, joint pain and myalgias.  Skin: Negative for rash.  Neurological: Negative for dizziness, tingling, focal weakness, seizures, weakness and headaches.  Endo/Heme/Allergies: Does not bruise/bleed easily.  Psychiatric/Behavioral: Negative for depression and suicidal ideas. The patient does not have insomnia.       No Known Allergies   Past Medical History:  Diagnosis Date  . Arthritis   . Asthma   . Breast cancer ([place]   . Diabetes mellitus without complication ([place]   . Hypertension   . Obesity   . Pulmonary hypertension ([place]   . Sleep apnea      Past Surgical History:  Procedure Laterality Date  . BREAST LUMPECTOMY Right 05/23/2019   invasive DCIS radation   . CATARACT EXTRACTION    . CESAREAN SECTION     x 3     Social History   Socioeconomic History  . Marital status: Single    Spouse name: Not on file  . Number of children: Not on file  . Years of education: Not on file  .  Highest education level: Not on file  Occupational History  . Not on file  Tobacco Use  . Smoking status: Former Smoker    Types: Cigarettes  . Smokeless tobacco: Never Used  . Tobacco comment: Quit smoking 30 years ago.  Vaping  Use  . Vaping Use: Never used  Substance and Sexual Activity  . Alcohol use: No  . Drug use: No  . Sexual activity: Not Currently  Other Topics Concern  . Not on file  Social History Narrative  . Not on file   Social Determinants of Health   Financial Resource Strain: Not on file  Food Insecurity: Not on file  Transportation Needs: Not on file  Physical Activity: Not on file  Stress: Not on file  Social Connections: Not on file  Intimate Partner Violence: Not on file    Family History  Problem Relation Age of Onset  . Diabetes Mother   . Stroke Mother   . Diabetes Father   . Diabetes Brother   . Cancer Neg Hx   . Heart failure Neg Hx   . Hypertension Neg Hx   . Breast cancer Neg Hx      Current Outpatient Medications:  .  albuterol (PROVENTIL HFA;VENTOLIN HFA) 108 (90 BASE) MCG/ACT inhaler, Inhale 4-6 puffs by mouth every 4 hours as needed for wheezing, cough, and/or shortness of breath (Patient taking differently: Inhale 2 puffs into the lungs every 4 (four) hours as needed for wheezing or shortness of breath.), Disp: 1 Inhaler, Rfl: 1 .  budesonide-formoterol (SYMBICORT) 160-4.5 MCG/ACT inhaler, Inhale 2 puffs into the lungs 2 (two) times daily., Disp: , Rfl:  .  calcium elemental as carbonate (CALCIUM ANTACID ULTRA) 400 MG chewable tablet, Chew 1,000 mg by mouth 3 (three) times daily., Disp: , Rfl:  .  citalopram (CELEXA) 40 MG tablet, Take 40 mg by mouth daily. , Disp: , Rfl: 10 .  D3-50 1.25 MG (50000 UT) capsule, Take by mouth once a week., Disp: , Rfl:  .  ipratropium-albuterol (DUONEB) 0.5-2.5 (3) MG/3ML SOLN, Take by nebulization 3 (three) times daily as needed., Disp: , Rfl:  .  letrozole (FEMARA) 2.5 MG tablet, TAKE 1 TABLET BY MOUTH EVERY DAY (DON'T START MED UNTIL YOU COMPLETE RADIATION 1ST), Disp: 90 tablet, Rfl: 2 .  losartan-hydrochlorothiazide (HYZAAR) 50-12.5 MG per tablet, Take 1 tablet by mouth daily., Disp: , Rfl: 5 .  metFORMIN (GLUCOPHAGE) 1000 MG  tablet, Take 1,000 mg by mouth 2 (two) times daily., Disp: , Rfl: 2 .  montelukast (SINGULAIR) 10 MG tablet, Take 10 mg by mouth every morning. , Disp: , Rfl:  .  Omega-3 Fatty Acids (FISH OIL) 1000 MG CAPS, Take 1,000 mg by mouth in the morning and at bedtime., Disp: , Rfl:  .  pantoprazole (PROTONIX) 40 MG tablet, Take 40 mg by mouth daily., Disp: , Rfl:  .  rosuvastatin (CRESTOR) 5 MG tablet, Take 5 mg by mouth daily., Disp: , Rfl:   Physical exam:  Vitals:   05/13/20 0923  Pulse: 69  Resp: 18  Temp: (!) 97.2 F (36.2 C)  TempSrc: Tympanic  SpO2: 95%  Weight: 244 lb (110.7 kg)   Physical Exam Constitutional:      General: She is not in acute distress. Cardiovascular:     Rate and Rhythm: Normal rate and regular rhythm.     Heart sounds: Normal heart sounds.  Pulmonary:     Effort: Pulmonary effort is normal.     Breath sounds: Normal  breath sounds.  Skin:    General: Skin is warm and dry.  Neurological:     Mental Status: She is alert and oriented to person, place, and time.      CMP Latest Ref Rng & Units 10/07/2019  Glucose 70 - 99 mg/dL 115(H)  BUN 6 - 20 mg/dL 21(H)  Creatinine 0.44 - 1.00 mg/dL 0.86  Sodium 135 - 145 mmol/L 140  Potassium 3.5 - 5.1 mmol/L 4.4  Chloride 98 - 111 mmol/L 103  CO2 22 - 32 mmol/L 28  Calcium 8.9 - 10.3 mg/dL 9.2  Total Protein 6.5 - 8.1 g/dL 7.1  Total Bilirubin 0.3 - 1.2 mg/dL 0.6  Alkaline Phos 38 - 126 U/L 50  AST 15 - 41 U/L 21  ALT 0 - 44 U/L 23   CBC Latest Ref Rng & Units 08/06/2019  WBC 4.0 - 10.5 K/uL 5.5  Hemoglobin 12.0 - 15.0 g/dL 12.6  Hematocrit 36.0 - 46.0 % 37.6  Platelets 150 - 400 K/uL 208    No images are attached to the encounter.  MM DIAG BREAST TOMO BILATERAL  Result Date: 04/28/2020 CLINICAL DATA:  RIGHT lumpectomy 2021 EXAM: DIGITAL DIAGNOSTIC BILATERAL MAMMOGRAM WITH TOMOSYNTHESIS AND CAD TECHNIQUE: Bilateral digital diagnostic mammography and breast tomosynthesis was performed. The images were  evaluated with computer-aided detection. COMPARISON:  Previous exam(s). ACR Breast Density Category b: There are scattered areas of fibroglandular density. FINDINGS: There is density and architectural distortion within the RIGHT breast, consistent with postsurgical changes. These are new in comparison to prior. No suspicious mass, distortion, or microcalcifications are identified to suggest presence of malignancy. IMPRESSION: No mammographic evidence of malignancy. RECOMMENDATION: Diagnostic mammogram is suggested in 1 year. (Code:DM-B-01Y) I have discussed the findings and recommendations with the patient. If applicable, a reminder letter will be sent to the patient regarding the next appointment. BI-RADS CATEGORY  2: Benign. Electronically Signed   By: Valentino Saxon MD   On: 04/28/2020 14:41     Assessment and plan- Patient is a 60 y.o. female   with invasive mammary carcinoma of the right breast pathological prognostic stage I APT1CPN0 ER/PR positive HER-2 negative s/p lumpectomy adjuvant radiation therapy.  She is currently on letrozole and this is a routine follow-up visit  Clinically patient is doing well with no concerning symptoms of recurrence.  She recently had a mammogram earlier this month which was unremarkable.  She will continue taking letrozole along with calcium and vitamin D for 5 years.  I will see her back in 6 months for an in person breast exam   Visit Diagnosis 1. Encounter for follow-up surveillance of breast cancer   2. Use of letrozole (Femara)      Dr. Randa Evens, MD, MPH [facility] [phone] 05/13/2020 12:19 PM

## 2020-05-13 NOTE — Progress Notes (Signed)
Survivorship Care Plan visit completed.  Treatment summary reviewed and given to patient.  ASCO answers booklet reviewed and given to patient.  CARE program and Cancer Transitions discussed with patient along with other resources cancer center offers to patients and caregivers.  Patient verbalized understanding.    

## 2020-07-17 ENCOUNTER — Other Ambulatory Visit: Payer: Self-pay | Admitting: Oncology

## 2020-08-26 ENCOUNTER — Ambulatory Visit: Payer: Managed Care, Other (non HMO) | Admitting: Radiation Oncology

## 2020-11-05 ENCOUNTER — Other Ambulatory Visit: Payer: Self-pay

## 2020-11-05 ENCOUNTER — Encounter: Payer: Self-pay | Admitting: Emergency Medicine

## 2020-11-05 ENCOUNTER — Emergency Department: Payer: Managed Care, Other (non HMO)

## 2020-11-05 DIAGNOSIS — Z853 Personal history of malignant neoplasm of breast: Secondary | ICD-10-CM

## 2020-11-05 DIAGNOSIS — E669 Obesity, unspecified: Secondary | ICD-10-CM | POA: Diagnosis present

## 2020-11-05 DIAGNOSIS — Z20822 Contact with and (suspected) exposure to covid-19: Secondary | ICD-10-CM | POA: Diagnosis not present

## 2020-11-05 DIAGNOSIS — Z87891 Personal history of nicotine dependence: Secondary | ICD-10-CM | POA: Diagnosis not present

## 2020-11-05 DIAGNOSIS — F32A Depression, unspecified: Secondary | ICD-10-CM | POA: Diagnosis present

## 2020-11-05 DIAGNOSIS — Z79899 Other long term (current) drug therapy: Secondary | ICD-10-CM | POA: Insufficient documentation

## 2020-11-05 DIAGNOSIS — F329 Major depressive disorder, single episode, unspecified: Secondary | ICD-10-CM | POA: Diagnosis not present

## 2020-11-05 DIAGNOSIS — I10 Essential (primary) hypertension: Secondary | ICD-10-CM | POA: Diagnosis not present

## 2020-11-05 DIAGNOSIS — E119 Type 2 diabetes mellitus without complications: Secondary | ICD-10-CM | POA: Diagnosis not present

## 2020-11-05 DIAGNOSIS — Z7951 Long term (current) use of inhaled steroids: Secondary | ICD-10-CM

## 2020-11-05 DIAGNOSIS — M1990 Unspecified osteoarthritis, unspecified site: Secondary | ICD-10-CM | POA: Diagnosis present

## 2020-11-05 DIAGNOSIS — Z833 Family history of diabetes mellitus: Secondary | ICD-10-CM

## 2020-11-05 DIAGNOSIS — J45901 Unspecified asthma with (acute) exacerbation: Secondary | ICD-10-CM | POA: Diagnosis present

## 2020-11-05 DIAGNOSIS — J441 Chronic obstructive pulmonary disease with (acute) exacerbation: Principal | ICD-10-CM | POA: Insufficient documentation

## 2020-11-05 DIAGNOSIS — I272 Pulmonary hypertension, unspecified: Secondary | ICD-10-CM | POA: Diagnosis present

## 2020-11-05 DIAGNOSIS — Z79811 Long term (current) use of aromatase inhibitors: Secondary | ICD-10-CM

## 2020-11-05 DIAGNOSIS — G4730 Sleep apnea, unspecified: Secondary | ICD-10-CM | POA: Diagnosis present

## 2020-11-05 DIAGNOSIS — Z7984 Long term (current) use of oral hypoglycemic drugs: Secondary | ICD-10-CM

## 2020-11-05 DIAGNOSIS — R0602 Shortness of breath: Secondary | ICD-10-CM | POA: Diagnosis present

## 2020-11-05 DIAGNOSIS — Z823 Family history of stroke: Secondary | ICD-10-CM

## 2020-11-05 LAB — CBC
HCT: 36.1 % (ref 36.0–46.0)
Hemoglobin: 12.1 g/dL (ref 12.0–15.0)
MCH: 29.6 pg (ref 26.0–34.0)
MCHC: 33.5 g/dL (ref 30.0–36.0)
MCV: 88.3 fL (ref 80.0–100.0)
Platelets: 216 10*3/uL (ref 150–400)
RBC: 4.09 MIL/uL (ref 3.87–5.11)
RDW: 13.2 % (ref 11.5–15.5)
WBC: 7.3 10*3/uL (ref 4.0–10.5)
nRBC: 0 % (ref 0.0–0.2)

## 2020-11-05 LAB — BASIC METABOLIC PANEL
Anion gap: 9 (ref 5–15)
BUN: 17 mg/dL (ref 8–23)
CO2: 26 mmol/L (ref 22–32)
Calcium: 8.8 mg/dL — ABNORMAL LOW (ref 8.9–10.3)
Chloride: 103 mmol/L (ref 98–111)
Creatinine, Ser: 0.7 mg/dL (ref 0.44–1.00)
GFR, Estimated: >60 mL/min (ref >60)
Glucose, Bld: 125 mg/dL — ABNORMAL HIGH (ref 70–99)
Potassium: 3.7 mmol/L (ref 3.5–5.1)
Sodium: 138 mmol/L (ref 135–145)

## 2020-11-05 LAB — TROPONIN I (HIGH SENSITIVITY): Troponin I (High Sensitivity): 5 ng/L (ref <18)

## 2020-11-05 NOTE — ED Notes (Signed)
Pt O2 saturation 84%-88% on RA. Pt placed on 2L O2 via [place]. Pt O2 saturation staying around 88% on 2L O2 and up to 90% on 3L O2. Pt placed on 4L O2 via [place]. Pt O2 sat at 95% on 4L. Pt does not use O2 at home. Annie Main, RN aware and in triage rm.

## 2020-11-05 NOTE — ED Triage Notes (Signed)
Pt arrived via POV with reports of shortness of breath since Tuesday, pt has hx of asthma, reports she takes symbicort daily and has been using nebs with no improvement.   Pt seen at PCP's office today had neg covid test. Pt states she was given a prednisone shot around 1130am.  Pt continues to have shortness of breath,reports cough that is non-productive at this time, but feels like she has congestion in chest, no chest pain c/o HA.  Denies any BLE.  Pt was 88% on RA, placed on 4L [place] up to 95%.  Pt denies any sick contacts.

## 2020-11-06 ENCOUNTER — Encounter: Payer: Self-pay | Admitting: Internal Medicine

## 2020-11-06 ENCOUNTER — Observation Stay
Admission: EM | Admit: 2020-11-06 | Discharge: 2020-11-07 | Disposition: A | Discharge status: Home / Self Care | Payer: Managed Care, Other (non HMO) | Attending: Family Medicine | Admitting: Family Medicine

## 2020-11-06 DIAGNOSIS — F32A Depression, unspecified: Secondary | ICD-10-CM | POA: Diagnosis present

## 2020-11-06 DIAGNOSIS — J441 Chronic obstructive pulmonary disease with (acute) exacerbation: Principal | ICD-10-CM

## 2020-11-06 DIAGNOSIS — I10 Essential (primary) hypertension: Secondary | ICD-10-CM | POA: Diagnosis present

## 2020-11-06 DIAGNOSIS — E119 Type 2 diabetes mellitus without complications: Secondary | ICD-10-CM

## 2020-11-06 DIAGNOSIS — J45901 Unspecified asthma with (acute) exacerbation: Secondary | ICD-10-CM

## 2020-11-06 DIAGNOSIS — Z853 Personal history of malignant neoplasm of breast: Secondary | ICD-10-CM

## 2020-11-06 HISTORY — DX: Personal history of malignant neoplasm of breast: Z85.3

## 2020-11-06 LAB — CREATININE, SERUM
Creatinine, Ser: 0.76 mg/dL (ref 0.44–1.00)
GFR, Estimated: >60 mL/min (ref >60)

## 2020-11-06 LAB — CBG MONITORING, ED
Glucose-Capillary: 234 mg/dL — ABNORMAL HIGH (ref 70–99)
Glucose-Capillary: 238 mg/dL — ABNORMAL HIGH (ref 70–99)
Glucose-Capillary: 133 mg/dL — ABNORMAL HIGH (ref 70–99)

## 2020-11-06 LAB — CBC
HCT: 34.9 % — ABNORMAL LOW (ref 36.0–46.0)
Hemoglobin: 11.7 g/dL — ABNORMAL LOW (ref 12.0–15.0)
MCH: 29.2 pg (ref 26.0–34.0)
MCHC: 33.5 g/dL (ref 30.0–36.0)
MCV: 87 fL (ref 80.0–100.0)
Platelets: 207 10*3/uL (ref 150–400)
RBC: 4.01 MIL/uL (ref 3.87–5.11)
RDW: 13 % (ref 11.5–15.5)
WBC: 4.4 10*3/uL (ref 4.0–10.5)
nRBC: 0 % (ref 0.0–0.2)

## 2020-11-06 LAB — RESP PANEL BY RT-PCR (FLU A&B, COVID) ARPGX2
Influenza A by PCR: NEGATIVE
Influenza B by PCR: NEGATIVE
SARS Coronavirus 2 by RT PCR: NEGATIVE

## 2020-11-06 LAB — GLUCOSE, CAPILLARY: Glucose-Capillary: 206 mg/dL — ABNORMAL HIGH (ref 70–99)

## 2020-11-06 LAB — HIV ANTIBODY (ROUTINE TESTING W REFLEX): HIV Screen 4th Generation wRfx: NONREACTIVE

## 2020-11-06 LAB — TROPONIN I (HIGH SENSITIVITY): Troponin I (High Sensitivity): 5 ng/L (ref <18)

## 2020-11-06 MED ORDER — PREDNISONE 20 MG PO TABS
60.0000 mg | ORAL_TABLET | Freq: Once | ORAL | Status: AC
  Administered 2020-11-06: 60 mg via ORAL
  Filled 2020-11-06: qty 3

## 2020-11-06 MED ORDER — ONDANSETRON HCL 4 MG PO TABS: 4.0000 mg | ORAL_TABLET | Freq: Four times a day (QID) | ORAL | Status: DC | PRN

## 2020-11-06 MED ORDER — ACETAMINOPHEN 325 MG PO TABS: 650.0000 mg | ORAL_TABLET | Freq: Four times a day (QID) | ORAL | Status: DC | PRN

## 2020-11-06 MED ORDER — IPRATROPIUM-ALBUTEROL 0.5-2.5 (3) MG/3ML IN SOLN
3.0000 mL | Freq: Once | RESPIRATORY_TRACT | Status: AC
  Administered 2020-11-06: 3 mL via RESPIRATORY_TRACT
  Filled 2020-11-06: qty 3

## 2020-11-06 MED ORDER — IPRATROPIUM-ALBUTEROL 0.5-2.5 (3) MG/3ML IN SOLN
3.0000 mL | Freq: Four times a day (QID) | RESPIRATORY_TRACT | Status: DC
  Administered 2020-11-06 – 2020-11-07 (×5): 3 mL via RESPIRATORY_TRACT
  Filled 2020-11-06 (×6): qty 3

## 2020-11-06 MED ORDER — ONDANSETRON HCL 4 MG/2ML IJ SOLN: 4.0000 mg | Freq: Four times a day (QID) | INTRAMUSCULAR | Status: DC | PRN

## 2020-11-06 MED ORDER — ENOXAPARIN SODIUM 60 MG/0.6ML IJ SOSY
0.5000 mg/kg | PREFILLED_SYRINGE | INTRAMUSCULAR | Status: DC
  Administered 2020-11-06: 55 mg via SUBCUTANEOUS
  Filled 2020-11-06 (×2): qty 0.6

## 2020-11-06 MED ORDER — ALBUTEROL (5 MG/ML) CONTINUOUS INHALATION SOLN
15.0000 mg | INHALATION_SOLUTION | Freq: Once | RESPIRATORY_TRACT | Status: AC
  Administered 2020-11-06: 15 mg via RESPIRATORY_TRACT
  Filled 2020-11-06: qty 20

## 2020-11-06 MED ORDER — METHYLPREDNISOLONE SODIUM SUCC 40 MG IJ SOLR
40.0000 mg | Freq: Two times a day (BID) | INTRAMUSCULAR | Status: AC
  Administered 2020-11-06 (×2): 40 mg via INTRAVENOUS
  Filled 2020-11-06 (×2): qty 1

## 2020-11-06 MED ORDER — PREDNISONE 20 MG PO TABS
40.0000 mg | ORAL_TABLET | Freq: Every day | ORAL | Status: DC
  Administered 2020-11-07: 08:00:00 40 mg via ORAL
  Filled 2020-11-06: qty 2

## 2020-11-06 MED ORDER — INSULIN ASPART 100 UNIT/ML IJ SOLN
0.0000 [IU] | Freq: Three times a day (TID) | INTRAMUSCULAR | Status: DC
  Administered 2020-11-06: 3 [IU] via SUBCUTANEOUS
  Administered 2020-11-06 (×2): 7 [IU] via SUBCUTANEOUS
  Administered 2020-11-07: 08:00:00 3 [IU] via SUBCUTANEOUS
  Filled 2020-11-06 (×4): qty 1

## 2020-11-06 MED ORDER — ALBUTEROL SULFATE (2.5 MG/3ML) 0.083% IN NEBU: 2.5000 mg | INHALATION_SOLUTION | RESPIRATORY_TRACT | Status: DC | PRN

## 2020-11-06 MED ORDER — ACETAMINOPHEN 650 MG RE SUPP: 650.0000 mg | Freq: Four times a day (QID) | RECTAL | Status: DC | PRN

## 2020-11-06 MED ORDER — INFLUENZA VAC SPLIT QUAD 0.5 ML IM SUSY
0.5000 mL | PREFILLED_SYRINGE | INTRAMUSCULAR | Status: DC
  Filled 2020-11-06: qty 0.5

## 2020-11-06 MED ORDER — IPRATROPIUM BROMIDE 0.02 % IN SOLN
1.0000 mg | Freq: Once | RESPIRATORY_TRACT | Status: AC
  Administered 2020-11-06: 1 mg via RESPIRATORY_TRACT
  Filled 2020-11-06: qty 5

## 2020-11-06 MED ORDER — INSULIN ASPART 100 UNIT/ML IJ SOLN
0.0000 [IU] | Freq: Every day | INTRAMUSCULAR | Status: DC
  Administered 2020-11-06: 21:00:00 2 [IU] via SUBCUTANEOUS
  Filled 2020-11-06: qty 1

## 2020-11-06 NOTE — Care Plan (Signed)
This 60 years old female with PMH significant for asthma/COPD, diabetes, breast cancer presented to the ED with shortness of breath, productive cough, nasal congestion and intermittent chest pain while coughing and breathing.  Patient denies any fever, sick contacts, recent travel.  Chest x-ray shows emphysematous and chronic bronchitic changes in the lungs,  No focal consolidation. Patient is admitted for COPD exacerbation, continue on scheduled and as needed nebulized bronchodilator treatments,  IV steroids, supplemental oxygen as needed.  Patient was seen and examined at bedside,  still has significant wheezing.

## 2020-11-06 NOTE — ED Provider Notes (Signed)
[facility]  ____________________________________________   Event Date/Time   First MD Initiated Contact with Patient 11/06/20 0003     (approximate)  I have reviewed the triage vital signs and the nursing notes.   HISTORY  Chief Complaint Shortness of Breath    HPI Nancy Patton is a 60 y.o. female past medical history of asthma, diabetes, pulmonary hypertension, remote history of breast cancer in remission who presents with dyspnea.  Earlier this week patient started with runny nose and cough.  She has become progressively short of breath.  She endorses cough is productive of clear sputum, no hemoptysis.  She has some chest pain that is worse with breathing, nonexertional.  She denies associated nausea, vomiting, abdominal pain.  Has not had fevers or chills at home.  She denies any lower extremity swelling. The patient denies hx of prior DVT/PE, unilateral leg pain/swelling, hormone use, recent surgery, hx of cancer, prolonged immobilization, or hemoptysis.  Patient says she has not had to been hospitalized for asthma in over 3 years.  Does have a history of prior ICU admission.          Past Medical History:  Diagnosis Date   Arthritis    Asthma    Breast cancer ([place])    Diabetes mellitus without complication ([place])    Hypertension    Obesity    Pulmonary hypertension ([place])    Sleep apnea     Patient Active Problem List   Diagnosis Date Noted   Status post breast lumpectomy 05/29/2019   Cancer of right breast, stage 1, estrogen receptor positive ([place]) 05/15/2019   COPD exacerbation ([place]) 08/24/2015   HTN (hypertension) 07/10/2015   Type 2 diabetes mellitus ([place]) 07/10/2015   Depression 07/10/2015   Mild intermittent asthma without complication 0000000   Acute respiratory distress 09/26/2014   Asthmatic bronchitis with acute exacerbation 09/26/2014   Asthmatic bronchitis with exacerbation 09/26/2014    Past Surgical History:   Procedure Laterality Date   BREAST LUMPECTOMY Right 05/23/2019   invasive DCIS radation    CATARACT EXTRACTION     CESAREAN SECTION     x 3     Prior to Admission medications   Medication Sig Start Date End Date Taking? Authorizing Provider  albuterol (PROVENTIL HFA;VENTOLIN HFA) 108 (90 BASE) MCG/ACT inhaler Inhale 4-6 puffs by mouth every 4 hours as needed for wheezing, cough, and/or shortness of breath Patient taking differently: Inhale 2 puffs into the lungs every 4 (four) hours as needed for wheezing or shortness of breath. 07/08/14  Yes Hinda Kehr, MD  budesonide-formoterol [facility]) 160-4.5 MCG/ACT inhaler Inhale 2 puffs into the lungs 2 (two) times daily.   Yes [provider]  calcium elemental as carbonate (CALCIUM ANTACID ULTRA) 400 MG chewable tablet Chew 1,000 mg by mouth 3 (three) times daily.   Yes [provider]  citalopram (CELEXA) 40 MG tablet Take 40 mg by mouth daily.  09/15/14  Yes [provider]  ipratropium-albuterol (DUONEB) 0.5-2.5 (3) MG/3ML SOLN Take by nebulization 3 (three) times daily as needed. 10/08/19  Yes [provider]  letrozole (FEMARA) 2.5 MG tablet TAKE 1 TABLET BY MOUTH EVERY DAY (DON'T START MED UNTIL YOU COMPLETE RADIATION 1ST) 07/17/20  Yes Sindy Guadeloupe, MD  losartan-hydrochlorothiazide (HYZAAR) 50-12.5 MG per tablet Take 1 tablet by mouth daily. 09/15/14  Yes [provider]  metFORMIN (GLUCOPHAGE) 1000 MG tablet Take 1,000 mg by mouth 2 (two) times daily. 09/09/14  Yes [provider]  montelukast (SINGULAIR) 10 MG tablet Take 10 mg by mouth every morning.  09/14/14  Yes [provider]  Omega-3 Fatty Acids (FISH OIL) 1000 MG CAPS Take 1,000 mg by mouth in the morning and at bedtime.   Yes [provider]  pantoprazole (PROTONIX) 40 MG tablet Take 40 mg by mouth daily. 01/09/20  Yes [provider]  D3-50 1.25 MG (50000 UT) capsule Take by mouth once a week. 08/22/19    [provider]  rosuvastatin (CRESTOR) 5 MG tablet Take 5 mg by mouth daily. Patient not taking: No sig reported 10/14/19   [provider]    Allergies Patient has no known allergies.  Family History  Problem Relation Age of Onset   Diabetes Mother    Stroke Mother    Diabetes Father    Diabetes Brother    Cancer Neg Hx    Heart failure Neg Hx    Hypertension Neg Hx    Breast cancer Neg Hx     Social History Social History   Tobacco Use   Smoking status: Former    Types: Cigarettes   Smokeless tobacco: Never   Tobacco comments:    Quit smoking 30 years ago.  Vaping Use   Vaping Use: Never used  Substance Use Topics   Alcohol use: No   Drug use: No    Review of Systems   Review of Systems  Constitutional:  Negative for chills and fever.  HENT:  Positive for congestion.   Respiratory:  Positive for cough, chest tightness and shortness of breath.   Cardiovascular:  Positive for chest pain. Negative for palpitations and leg swelling.  Gastrointestinal:  Negative for abdominal pain, nausea and vomiting.  All other systems reviewed and are negative.  Physical Exam Updated Vital Signs BP 138/78   Pulse 93   Temp 100 F (37.8 C) (Oral)   Resp 17   Ht '5\' 7"'$  (1.702 m)   Wt 109.8 kg   SpO2 97%   BMI 37.90 kg/m   Physical Exam Vitals and nursing note reviewed.  Constitutional:      General: She is not in acute distress.    Appearance: Normal appearance.  HENT:     Head: Normocephalic and atraumatic.  Eyes:     General: No scleral icterus.    Conjunctiva/sclera: Conjunctivae normal.  Pulmonary:     Effort: Pulmonary effort is normal. Tachypnea present. No respiratory distress.     Breath sounds: No stridor.     Comments: Patient is able to speak in normal sentences, somewhat dyspneic when moving around in bed, biphasic wheezing with moderate air movement Musculoskeletal:        General: No deformity or signs of injury.     Cervical  back: Normal range of motion.     Right lower leg: No edema.     Left lower leg: No edema.  Skin:    General: Skin is dry.     Coloration: Skin is not jaundiced or pale.  Neurological:     General: No focal deficit present.     Mental Status: She is alert and oriented to person, place, and time. Mental status is at baseline.  Psychiatric:        Mood and Affect: Mood normal.        Behavior: Behavior normal.     LABS (all labs ordered are listed, but only abnormal results are displayed)  Labs Reviewed  BASIC METABOLIC PANEL - Abnormal; Notable  for the following components:      Result Value   Glucose, Bld 125 (*)    Calcium 8.8 (*)    All other components within normal limits  RESP PANEL BY RT-PCR (FLU A&B, COVID) ARPGX2  CBC  TROPONIN I (HIGH SENSITIVITY)  TROPONIN I (HIGH SENSITIVITY)   ____________________________________________  EKG  Normal sinus rhythm, normal axis, normal intervals, low voltage, no acute ischemic changes ____________________________________________  RADIOLOGY Almeta Monas, personally viewed and evaluated these images (plain radiographs) as part of my medical decision making, as well as reviewing the written report by the radiologist.  ED MD interpretation: I reviewed the chest x-ray which does not show any acute cardiopulmonary process    ____________________________________________   PROCEDURES  Procedure(s) performed (including Critical Care):  Procedures   ____________________________________________   INITIAL IMPRESSION / ASSESSMENT AND PLAN / ED COURSE     60 year old female with prior history of asthma presenting with dyspnea and cough.  She is saturating 88% on room air in triage.  On my evaluation she is on 2 L satting 96%.  She is slightly tachypneic and has biphasic wheezing but is able to speak in full sentences.  Her chest x-ray does not show any acute pneumonia.  I suspect asthma exacerbation secondary to  underlying viral illness.  Will treat with albuterol, Atrovent and steroids.  Her labs are reassuring here but given her oxygen requirement she may require admission.  After 1 hour of albuterol, and Atrovent patient still with significant wheezing.  She is satting in the low 90s at rest.  We will give another DuoNeb.  Patient wheezing somewhat improved but still with expiratory wheezing, satting 90% on room air at rest.  Will admit.      ____________________________________________   FINAL CLINICAL IMPRESSION(S) / ED DIAGNOSES  Final diagnoses:  Exacerbation of asthma, unspecified asthma severity, unspecified whether persistent     ED Discharge Orders     None        Note:  This document was prepared using Dragon voice recognition software and may include unintentional dictation errors.    Rada Hay, MD 11/06/20 [phone]

## 2020-11-06 NOTE — ED Notes (Signed)
RT called and aware of Pt tx.

## 2020-11-06 NOTE — ED Notes (Signed)
RT in room.

## 2020-11-06 NOTE — H&P (Signed)
History and Physical    Nancy Patton [id number] DOB: March 22, 1960 DOA: 11/06/2020  PCP: Perrin Maltese, MD   Patient coming from: home  I have personally briefly reviewed patient's old medical records in [place]  Chief Complaint: shortness of breath  HPI: Nancy Patton is a 60 y.o. female with medical history significant for Asthma/COPD, diabetes, breast cancer who presents to the ED with shortness of breath, cough productive of clear sputum, nasal congestion and chest pain on breathing.  Denies nausea vomiting or diaphoresis.  Has no fever or chills.  Denies abdominal pain, diarrhea.  Denies lower extremity pain or swelling  ED course: On arrival temperature of 100, pulse 93, BP 138/78 and O2 sat 97% on room air Blood work unremarkable.  Troponin 5/5   EKG, personally reviewed and interpreted NSR at 90 with no acute ST-T wave changes  Imaging: Chest x-ray: Emphysematous and chronic bronchitic changes in the lungs.  No focal consolidation  Patient treated with duo nebs and prednisone but continued to have wheezing and increased work of breathing.  Hospitalist consulted for admission.  Review of Systems: As per HPI otherwise all other systems on review of systems negative.    Past Medical History:  Diagnosis Date   Arthritis    Asthma    Breast cancer ([place])    Diabetes mellitus without complication ([place])    Hypertension    Obesity    Pulmonary hypertension ([place])    Sleep apnea     Past Surgical History:  Procedure Laterality Date   BREAST LUMPECTOMY Right 05/23/2019   invasive DCIS radation    CATARACT EXTRACTION     CESAREAN SECTION     x 3      reports that she has quit smoking. Her smoking use included cigarettes. She has never used smokeless tobacco. She reports that she does not drink alcohol and does not use drugs.  No Known Allergies  Family History  Problem Relation Age of Onset   Diabetes Mother    Stroke Mother    Diabetes  Father    Diabetes Brother    Cancer Neg Hx    Heart failure Neg Hx    Hypertension Neg Hx    Breast cancer Neg Hx       Prior to Admission medications   Medication Sig Start Date End Date Taking? Authorizing Provider  albuterol (PROVENTIL HFA;VENTOLIN HFA) 108 (90 BASE) MCG/ACT inhaler Inhale 4-6 puffs by mouth every 4 hours as needed for wheezing, cough, and/or shortness of breath Patient taking differently: Inhale 2 puffs into the lungs every 4 (four) hours as needed for wheezing or shortness of breath. 07/08/14  Yes Hinda Kehr, MD  budesonide-formoterol [facility]) 160-4.5 MCG/ACT inhaler Inhale 2 puffs into the lungs 2 (two) times daily.   Yes [provider]  calcium elemental as carbonate (CALCIUM ANTACID ULTRA) 400 MG chewable tablet Chew 1,000 mg by mouth 3 (three) times daily.   Yes [provider]  citalopram (CELEXA) 40 MG tablet Take 40 mg by mouth daily.  09/15/14  Yes [provider]  ipratropium-albuterol (DUONEB) 0.5-2.5 (3) MG/3ML SOLN Take by nebulization 3 (three) times daily as needed. 10/08/19  Yes [provider]  letrozole (FEMARA) 2.5 MG tablet TAKE 1 TABLET BY MOUTH EVERY DAY (DON'T START MED UNTIL YOU COMPLETE RADIATION 1ST) 07/17/20  Yes Sindy Guadeloupe, MD  losartan-hydrochlorothiazide (HYZAAR) 50-12.5 MG per tablet Take 1 tablet by mouth daily. 09/15/14  Yes [provider]  metFORMIN (GLUCOPHAGE) 1000 MG tablet Take 1,000 mg by mouth 2 (two) times daily. 09/09/14  Yes [provider]  montelukast (SINGULAIR) 10 MG tablet Take 10 mg by mouth every morning.  09/14/14  Yes [provider]  Omega-3 Fatty Acids (FISH OIL) 1000 MG CAPS Take 1,000 mg by mouth in the morning and at bedtime.   Yes [provider]  pantoprazole (PROTONIX) 40 MG tablet Take 40 mg by mouth daily. 01/09/20  Yes [provider]  D3-50 1.25 MG (50000 UT) capsule Take by mouth once a week. 08/22/19   [provider]  rosuvastatin (CRESTOR) 5 MG tablet Take 5 mg by mouth daily. Patient not taking: No sig reported 10/14/19   [provider]    Physical Exam: Vitals:   11/06/20 0106 11/06/20 0144 11/06/20 0303 11/06/20 0358  BP: 135/78 135/74 138/78 138/78  Pulse: 77 (!) 52 95 93  Resp: '17 17 20 17  '$ Temp:      TempSrc:      SpO2: 96% 100% 100% 97%  Weight:      Height:         Vitals:   11/06/20 0106 11/06/20 0144 11/06/20 0303 11/06/20 0358  BP: 135/78 135/74 138/78 138/78  Pulse: 77 (!) 52 95 93  Resp: '17 17 20 17  '$ Temp:      TempSrc:      SpO2: 96% 100% 100% 97%  Weight:      Height:          Constitutional: Alert and oriented x 3 . tachypneic HEENT:      Head: Normocephalic and atraumatic.         Eyes: PERLA, EOMI, Conjunctivae are normal. Sclera is non-icteric.       Mouth/Throat: Mucous membranes are moist.       Neck: Supple with no signs of meningismus. Cardiovascular: Regular rate and rhythm. No murmurs, gallops, or rubs. 2+ symmetrical distal pulses are present . No JVD. No LE edema Respiratory: Respiratory effort increased with scattered rhonchi Gastrointestinal: Soft, non tender, and non distended with positive bowel sounds.  Genitourinary: No CVA tenderness. Musculoskeletal: Nontender with normal range of motion in all extremities. No cyanosis, or erythema of extremities. Neurologic:  Face is symmetric. Moving all extremities. No gross focal neurologic deficits . Skin: Skin is warm, dry.  No rash or ulcers Psychiatric: Mood and affect are normal    Labs on Admission: I have personally reviewed following labs and imaging studies  CBC: Recent Labs  Lab 11/05/20 2102  WBC 7.3  HGB 12.1  HCT 36.1  MCV 88.3  PLT [id number]   Basic Metabolic Panel: Recent Labs  Lab 11/05/20 2102  NA 138  K 3.7  CL 103  CO2 26  GLUCOSE 125*  BUN 17  CREATININE 0.70  CALCIUM 8.8*   GFR: Estimated Creatinine Clearance: 94.3 mL/min (by C-G formula based on SCr of  0.7 mg/dL). Liver Function Tests: No results for input(s): AST, ALT, ALKPHOS, BILITOT, PROT, ALBUMIN in the last 168 hours. No results for input(s): LIPASE, AMYLASE in the last 168 hours. No results for input(s): AMMONIA in the last 168 hours. Coagulation Profile: No results for input(s): INR, PROTIME in the last 168 hours. Cardiac Enzymes: No results for input(s): CKTOTAL, CKMB, CKMBINDEX, TROPONINI in the last 168 hours. BNP (last 3 results) No results for input(s): PROBNP in the last 8760 hours. HbA1C: No results for input(s): HGBA1C in the last 72 hours. CBG: No  results for input(s): GLUCAP in the last 168 hours. Lipid Profile: No results for input(s): CHOL, HDL, LDLCALC, TRIG, CHOLHDL, LDLDIRECT in the last 72 hours. Thyroid Function Tests: No results for input(s): TSH, T4TOTAL, FREET4, T3FREE, THYROIDAB in the last 72 hours. Anemia Panel: No results for input(s): VITAMINB12, FOLATE, FERRITIN, TIBC, IRON, RETICCTPCT in the last 72 hours. Urine analysis:    Component Value Date/Time   COLORURINE YELLOW (A) 12/27/2015 1435   APPEARANCEUR CLEAR (A) 12/27/2015 1435   LABSPEC 1.016 12/27/2015 1435   PHURINE 5.0 12/27/2015 1435   GLUCOSEU NEGATIVE 12/27/2015 1435   HGBUR NEGATIVE 12/27/2015 1435   BILIRUBINUR NEGATIVE 12/27/2015 1435   KETONESUR NEGATIVE 12/27/2015 1435   PROTEINUR NEGATIVE 12/27/2015 1435   NITRITE NEGATIVE 12/27/2015 1435   LEUKOCYTESUR 1+ (A) 12/27/2015 1435    Radiological Exams on Admission: DG Chest 2 View  Result Date: 11/05/2020 CLINICAL DATA:  Shortness of breath since Tuesday. EXAM: CHEST - 2 VIEW COMPARISON:  04/07/2018 FINDINGS: Heart size and pulmonary vascularity are normal. Emphysematous changes in the lungs. Peribronchial thickening with central interstitial changes likely representing chronic bronchitis. No focal consolidation. No pleural effusions. No pneumothorax. Mediastinal contours appear intact. Degenerative changes in the spine and  shoulders. IMPRESSION: Emphysematous and chronic bronchitic changes in the lungs. No focal consolidation. Electronically Signed   By: Lucienne Capers M.D.   On: 11/05/2020 21:51     Assessment/Plan 60 year old female with history of asthma/COPD, diabetes, breast cancer presenting with shortness of breath, cough and nasal congestion.    COPD exacerbation (HCC) -Scheduled and as needed nebulized bronchodilator treatment - IV steroids, antitussives - Supplemental O2 as needed    HTN (hypertension) - Continue losartan    Type 2 diabetes mellitus (HCC) - Sliding scale insulin coverage    Depression - Continue Celexa    History of breast cancer - Continue Femara    DVT prophylaxis: Lovenox  Code Status: full code  Family Communication:  none  Disposition Plan: Back to previous home environment Consults called: none  Status: Observation    Athena Masse MD [facility]     11/06/2020, 5:21 AM

## 2020-11-06 NOTE — ED Notes (Signed)
Dwyane Dee, MD in room and updating pt on plan of care at this time. Plan at this time is observation until tomorrow (9/18) morning.

## 2020-11-06 NOTE — Progress Notes (Signed)
Anticoagulation monitoring(Lovenox):  60 yo female ordered Lovenox 40 mg Q24h    Filed Weights   11/05/20 2059  Weight: 109.8 kg (242 lb)   BMI 37.9    Lab Results  Component Value Date   CREATININE 0.70 11/05/2020   CREATININE 0.86 10/07/2019   CREATININE 0.66 05/21/2019   Estimated Creatinine Clearance: 94.3 mL/min (by C-G formula based on SCr of 0.7 mg/dL). Hemoglobin & Hematocrit     Component Value Date/Time   HGB 12.1 11/05/2020 2102   HGB 12.5 07/15/2013 0415   HCT 36.1 11/05/2020 2102   HCT 37.3 07/15/2013 0415     Per Protocol for Patient with estCrcl > 30 ml/min and BMI > 30, will transition to Lovenox 55 mg Q24h.

## 2020-11-07 DIAGNOSIS — J441 Chronic obstructive pulmonary disease with (acute) exacerbation: Secondary | ICD-10-CM | POA: Diagnosis not present

## 2020-11-07 LAB — GLUCOSE, CAPILLARY: Glucose-Capillary: 122 mg/dL — ABNORMAL HIGH (ref 70–99)

## 2020-11-07 MED ORDER — ALBUTEROL SULFATE HFA 108 (90 BASE) MCG/ACT IN AERS: 2.0000 | INHALATION_SPRAY | Freq: Four times a day (QID) | RESPIRATORY_TRACT | 2 refills | Status: DC | PRN

## 2020-11-07 MED ORDER — PREDNISONE 20 MG PO TABS
40.0000 mg | ORAL_TABLET | Freq: Every day | ORAL | 0 refills | Status: DC
Start: 2020-11-08 — End: 2021-05-13

## 2020-11-07 NOTE — Progress Notes (Signed)
Dr Dwyane Dee previously escribed (12:52) the Albuterol inhaler Rx to the [facility] in [place]; telephone to the pt [phone] advising her that Dr Dwyane Dee escribed the Albuterol inhaler to the [facility] in [place], [place]; advised her to call the nurse's station at [phone] if she does not receive a text from [facility] advising the Rx is ready; no questions voiced by the pt

## 2020-11-07 NOTE — Discharge Summary (Signed)
Physician Discharge Summary  Nancy Patton [id number] DOB: 1960-11-06 DOA: 11/06/2020  PCP: Perrin Maltese, MD  Admit date: 11/06/2020.  Discharge date: 11/07/2020  Admitted From: Home.  Disposition:  Home.  Recommendations for Outpatient Follow-up:  Follow up with PCP in 1-2 weeks. Please obtain BMP/CBC in one week. Advised to take prednisone 40 mg daily for next 4 days. Advised to continue albuterol inhaler as needed and Symbicort daily as scheduled.  Home Health: None. Equipment/Devices: None  Discharge Condition: Stable CODE STATUS:Full code Diet recommendation: Heart Healthy   Brief Summary / Hospital Course: This 60 years old obese female with PMH significant for asthma / COPD, diabetes, breast cancer presented to the ED with shortness of breath, productive cough, nasal congestion and intermittent chest pain while coughing and breathing.  Patient denies any fever, sick contacts, recent travel.  Chest x-ray shows emphysematous and chronic bronchitic changes in the lungs,  No focal consolidation. Patient was admitted for COPD exacerbation, she was continued on scheduled and as needed nebulized bronchodilator treatments,  IV steroids, supplemental oxygen as needed.  Patient was continued on bronchodilators and IV steroids. She has weaned down to room air.  Patient felt much better,  wheezing has significantly improved.  Patient has ambulated in the hallway without any difficulty breathing.  Patient feels better and want to be discharged.  Patient is being discharged home on prednisone, albuterol and Symbicort.  Discharge Diagnoses:  Principal Problem:   COPD exacerbation ([place]) Active Problems:   HTN (hypertension)   Type 2 diabetes mellitus ([place])   Depression   History of breast cancer   Discharge Instructions  Discharge Instructions     Call MD for:  difficulty breathing, headache or visual disturbances   Complete by: As directed    Call MD for:  persistant  dizziness or light-headedness   Complete by: As directed    Diet - low sodium heart healthy   Complete by: As directed    Diet - low sodium heart healthy   Complete by: As directed    Diet Carb Modified   Complete by: As directed    Discharge instructions   Complete by: As directed    Advised to follow-up with primary care physician in 1 week. Advised to take prednisone 40 mg daily for next 4 days. Advised to continue albuterol inhaler as needed and Symbicort daily as scheduled.   Increase activity slowly   Complete by: As directed    Increase activity slowly   Complete by: As directed       Allergies as of 11/07/2020   No Known Allergies      Medication List     STOP taking these medications    rosuvastatin 5 MG tablet Commonly known as: CRESTOR       TAKE these medications    albuterol 108 (90 Base) MCG/ACT inhaler Commonly known as: VENTOLIN HFA Inhale 4-6 puffs by mouth every 4 hours as needed for wheezing, cough, and/or shortness of breath What changed:  how much to take how to take this when to take this reasons to take this additional instructions   albuterol 108 (90 Base) MCG/ACT inhaler Commonly known as: VENTOLIN HFA Inhale 2 puffs into the lungs every 6 (six) hours as needed for wheezing or shortness of breath. What changed: You were already taking a medication with the same name, and this prescription was added. Make sure you understand how and when to take each.   budesonide-formoterol 160-4.5 MCG/ACT inhaler Commonly  known as: SYMBICORT Inhale 2 puffs into the lungs 2 (two) times daily.   Calcium Antacid Ultra 400 MG chewable tablet Generic drug: calcium elemental as carbonate Chew 1,000 mg by mouth 3 (three) times daily.   citalopram 40 MG tablet Commonly known as: CELEXA Take 40 mg by mouth daily.   D3-50 1.25 MG (50000 UT) capsule Generic drug: Cholecalciferol Take by mouth once a week.   Fish Oil 1000 MG Caps Take 1,000 mg by  mouth in the morning and at bedtime.   ipratropium-albuterol 0.5-2.5 (3) MG/3ML Soln Commonly known as: DUONEB Take by nebulization 3 (three) times daily as needed.   letrozole 2.5 MG tablet Commonly known as: FEMARA TAKE 1 TABLET BY MOUTH EVERY DAY (DON'T START MED UNTIL YOU COMPLETE RADIATION 1ST)   losartan-hydrochlorothiazide 50-12.5 MG tablet Commonly known as: HYZAAR Take 1 tablet by mouth daily.   metFORMIN 1000 MG tablet Commonly known as: GLUCOPHAGE Take 1,000 mg by mouth 2 (two) times daily.   montelukast 10 MG tablet Commonly known as: SINGULAIR Take 10 mg by mouth every morning.   pantoprazole 40 MG tablet Commonly known as: PROTONIX Take 40 mg by mouth daily.   predniSONE 20 MG tablet Commonly known as: DELTASONE Take 2 tablets (40 mg total) by mouth daily with breakfast. Start taking on: November 08, 2020        Follow-up Information     Perrin Maltese, MD Follow up in 1 week(s).   Specialty: Internal Medicine Contact information: [address] [place] [address] [phone]                No Known Allergies  Consultations: None   Procedures/Studies: DG Chest 2 View  Result Date: 11/05/2020 CLINICAL DATA:  Shortness of breath since Tuesday. EXAM: CHEST - 2 VIEW COMPARISON:  04/07/2018 FINDINGS: Heart size and pulmonary vascularity are normal. Emphysematous changes in the lungs. Peribronchial thickening with central interstitial changes likely representing chronic bronchitis. No focal consolidation. No pleural effusions. No pneumothorax. Mediastinal contours appear intact. Degenerative changes in the spine and shoulders. IMPRESSION: Emphysematous and chronic bronchitic changes in the lungs. No focal consolidation. Electronically Signed   By: Lucienne Capers M.D.   On: 11/05/2020 21:51    Subjective: Patient was seen and examined at bedside.  Overnight events noted.   Patient reports feeling much improved.  Wheezing has significantly  improved. Patient wants to be discharged.  Patient is being discharged home.  Discharge Exam: Vitals:   11/07/20 0723 11/07/20 0742  BP:  103/71  Pulse:  71  Resp:  18  Temp:  98.5 F (36.9 C)  SpO2: 98% 99%   Vitals:   11/06/20 2007 11/07/20 0420 11/07/20 0723 11/07/20 0742  BP:  (!) 148/75  103/71  Pulse:  64  71  Resp:    18  Temp:  (!) 97.5 F (36.4 C)  98.5 F (36.9 C)  TempSrc:  Oral  Oral  SpO2: 94% 97% 98% 99%  Weight:      Height:        General: Pt is alert, awake, not in acute distress Cardiovascular: S1-S2 heard, regular rate and rhythm, no murmur. Respiratory: Mild wheezing noted on both sides.  Improving.  Good air entry Abdominal: Soft, NT, ND, bowel sounds + Extremities: no edema, no cyanosis    The results of significant diagnostics from this hospitalization (including imaging, microbiology, ancillary and laboratory) are listed below for reference.     Microbiology: Recent Results (from the past  240 hour(s))  Resp Panel by RT-PCR (Flu A&B, Covid) Nasopharyngeal Swab     Status: None   Collection Time: 11/06/20 12:26 AM   Specimen: Nasopharyngeal Swab; Nasopharyngeal(NP) swabs in vial transport medium  Result Value Ref Range Status   SARS Coronavirus 2 by RT PCR NEGATIVE NEGATIVE Final    Comment: (NOTE) SARS-CoV-2 target nucleic acids are NOT DETECTED.  The SARS-CoV-2 RNA is generally detectable in upper respiratory specimens during the acute phase of infection. The lowest concentration of SARS-CoV-2 viral copies this assay can detect is 138 copies/mL. A negative result does not preclude SARS-Cov-2 infection and should not be used as the sole basis for treatment or other patient management decisions. A negative result may occur with  improper specimen collection/handling, submission of specimen other than nasopharyngeal swab, presence of viral mutation(s) within the areas targeted by this assay, and inadequate number of viral copies(<138  copies/mL). A negative result must be combined with clinical observations, patient history, and epidemiological information. The expected result is Negative.  Fact Sheet for Patients:  EntrepreneurPulse.com.au  Fact Sheet for Healthcare Providers:  IncredibleEmployment.be  This test is no t yet approved or cleared by the [place] FDA and  has been authorized for detection and/or diagnosis of SARS-CoV-2 by FDA under an Emergency Use Authorization (EUA). This EUA will remain  in effect (meaning this test can be used) for the duration of the COVID-19 declaration under Section 564(b)(1) of the Act, 21 U.S.C.section 360bbb-3(b)(1), unless the authorization is terminated  or revoked sooner.       Influenza A by PCR NEGATIVE NEGATIVE Final   Influenza B by PCR NEGATIVE NEGATIVE Final    Comment: (NOTE) The Xpert Xpress SARS-CoV-2/FLU/RSV plus assay is intended as an aid in the diagnosis of influenza from Nasopharyngeal swab specimens and should not be used as a sole basis for treatment. Nasal washings and aspirates are unacceptable for Xpert Xpress SARS-CoV-2/FLU/RSV testing.  Fact Sheet for Patients: EntrepreneurPulse.com.au  Fact Sheet for Healthcare Providers: IncredibleEmployment.be  This test is not yet approved or cleared by the [place] FDA and has been authorized for detection and/or diagnosis of SARS-CoV-2 by FDA under an Emergency Use Authorization (EUA). This EUA will remain in effect (meaning this test can be used) for the duration of the COVID-19 declaration under Section 564(b)(1) of the Act, 21 U.S.C. section 360bbb-3(b)(1), unless the authorization is terminated or revoked.  Performed at [facility], [address] [place], [place] [address]      Labs: BNP (last 3 results) No results for input(s): BNP in the last 8760 hours. Basic Metabolic Panel: Recent Labs  Lab  11/05/20 2102 11/06/20 1208  NA 138  --   K 3.7  --   CL 103  --   CO2 26  --   GLUCOSE 125*  --   BUN 17  --   CREATININE 0.70 0.76  CALCIUM 8.8*  --    Liver Function Tests: No results for input(s): AST, ALT, ALKPHOS, BILITOT, PROT, ALBUMIN in the last 168 hours. No results for input(s): LIPASE, AMYLASE in the last 168 hours. No results for input(s): AMMONIA in the last 168 hours. CBC: Recent Labs  Lab 11/05/20 2102 11/06/20 1208  WBC 7.3 4.4  HGB 12.1 11.7*  HCT 36.1 34.9*  MCV 88.3 87.0  PLT 216 207   Cardiac Enzymes: No results for input(s): CKTOTAL, CKMB, CKMBINDEX, TROPONINI in the last 168 hours. BNP: Invalid input(s): POCBNP CBG: Recent Labs  Lab 11/06/20  [id number] 11/06/20 1207 11/06/20 1636 11/06/20 2044 11/07/20 0740  GLUCAP 234* 238* 133* 206* 122*   D-Dimer No results for input(s): DDIMER in the last 72 hours. Hgb A1c No results for input(s): HGBA1C in the last 72 hours. Lipid Profile No results for input(s): CHOL, HDL, LDLCALC, TRIG, CHOLHDL, LDLDIRECT in the last 72 hours. Thyroid function studies No results for input(s): TSH, T4TOTAL, T3FREE, THYROIDAB in the last 72 hours.  Invalid input(s): FREET3 Anemia work up No results for input(s): VITAMINB12, FOLATE, FERRITIN, TIBC, IRON, RETICCTPCT in the last 72 hours. Urinalysis    Component Value Date/Time   COLORURINE YELLOW (A) 12/27/2015 1435   APPEARANCEUR CLEAR (A) 12/27/2015 1435   LABSPEC 1.016 12/27/2015 1435   PHURINE 5.0 12/27/2015 1435   GLUCOSEU NEGATIVE 12/27/2015 1435   HGBUR NEGATIVE 12/27/2015 1435   BILIRUBINUR NEGATIVE 12/27/2015 1435   KETONESUR NEGATIVE 12/27/2015 1435   PROTEINUR NEGATIVE 12/27/2015 1435   NITRITE NEGATIVE 12/27/2015 1435   LEUKOCYTESUR 1+ (A) 12/27/2015 1435   Sepsis Labs Invalid input(s): PROCALCITONIN,  WBC,  LACTICIDVEN Microbiology Recent Results (from the past 240 hour(s))  Resp Panel by RT-PCR (Flu A&B, Covid) Nasopharyngeal Swab     Status:  None   Collection Time: 11/06/20 12:26 AM   Specimen: Nasopharyngeal Swab; Nasopharyngeal(NP) swabs in vial transport medium  Result Value Ref Range Status   SARS Coronavirus 2 by RT PCR NEGATIVE NEGATIVE Final    Comment: (NOTE) SARS-CoV-2 target nucleic acids are NOT DETECTED.  The SARS-CoV-2 RNA is generally detectable in upper respiratory specimens during the acute phase of infection. The lowest concentration of SARS-CoV-2 viral copies this assay can detect is 138 copies/mL. A negative result does not preclude SARS-Cov-2 infection and should not be used as the sole basis for treatment or other patient management decisions. A negative result may occur with  improper specimen collection/handling, submission of specimen other than nasopharyngeal swab, presence of viral mutation(s) within the areas targeted by this assay, and inadequate number of viral copies(<138 copies/mL). A negative result must be combined with clinical observations, patient history, and epidemiological information. The expected result is Negative.  Fact Sheet for Patients:  EntrepreneurPulse.com.au  Fact Sheet for Healthcare Providers:  IncredibleEmployment.be  This test is no t yet approved or cleared by the [place] FDA and  has been authorized for detection and/or diagnosis of SARS-CoV-2 by FDA under an Emergency Use Authorization (EUA). This EUA will remain  in effect (meaning this test can be used) for the duration of the COVID-19 declaration under Section 564(b)(1) of the Act, 21 U.S.C.section 360bbb-3(b)(1), unless the authorization is terminated  or revoked sooner.       Influenza A by PCR NEGATIVE NEGATIVE Final   Influenza B by PCR NEGATIVE NEGATIVE Final    Comment: (NOTE) The Xpert Xpress SARS-CoV-2/FLU/RSV plus assay is intended as an aid in the diagnosis of influenza from Nasopharyngeal swab specimens and should not be used as a sole basis for  treatment. Nasal washings and aspirates are unacceptable for Xpert Xpress SARS-CoV-2/FLU/RSV testing.  Fact Sheet for Patients: EntrepreneurPulse.com.au  Fact Sheet for Healthcare Providers: IncredibleEmployment.be  This test is not yet approved or cleared by the [place] FDA and has been authorized for detection and/or diagnosis of SARS-CoV-2 by FDA under an Emergency Use Authorization (EUA). This EUA will remain in effect (meaning this test can be used) for the duration of the COVID-19 declaration under Section 564(b)(1) of the Act, 21 U.S.C. section 360bbb-3(b)(1), unless the authorization is terminated  or revoked.  Performed at [facility], [address] [place], [place] [address]      Time coordinating discharge: Over 30 minutes  SIGNED:   Shawna Clamp, MD  [facility] 11/07/2020, 1:56 PM Pager   If 7PM-7AM, please contact night-coverage

## 2020-11-07 NOTE — Discharge Instructions (Signed)
Advised to follow-up with primary care physician in 1 week. Advised to take prednisone 40 mg daily for next 4 days. Advised to continue albuterol inhaler as needed and Symbicort daily as scheduled.

## 2020-11-07 NOTE — Plan of Care (Signed)
  Problem: Education: Goal: Knowledge of General Education information will improve Description: Including pain rating scale, medication(s)/side effects and non-pharmacologic comfort measures Outcome: Adequate for Discharge   Problem: Health Behavior/Discharge Planning: Goal: Ability to manage health-related needs will improve Outcome: Adequate for Discharge   Problem: Clinical Measurements: Goal: Ability to maintain clinical measurements within normal limits will improve Outcome: Adequate for Discharge Goal: Will remain free from infection Outcome: Adequate for Discharge Goal: Diagnostic test results will improve Outcome: Adequate for Discharge Goal: Respiratory complications will improve Outcome: Adequate for Discharge Goal: Cardiovascular complication will be avoided Outcome: Adequate for Discharge   Problem: Activity: Goal: Risk for activity intolerance will decrease Outcome: Adequate for Discharge   Problem: Nutrition: Goal: Adequate nutrition will be maintained Outcome: Adequate for Discharge   Problem: Coping: Goal: Level of anxiety will decrease Outcome: Adequate for Discharge   Problem: Elimination: Goal: Will not experience complications related to bowel motility Outcome: Adequate for Discharge Goal: Will not experience complications related to urinary retention Outcome: Adequate for Discharge   Problem: Pain Managment: Goal: General experience of comfort will improve Outcome: Adequate for Discharge   Problem: Safety: Goal: Ability to remain free from injury will improve Outcome: Adequate for Discharge   Problem: Skin Integrity: Goal: Risk for impaired skin integrity will decrease Outcome: Adequate for Discharge   Problem: Education: Goal: Ability to describe self-care measures that may prevent or decrease complications (Diabetes Survival Skills Education) will improve Outcome: Adequate for Discharge Goal: Individualized Educational Video(s) Outcome:  Adequate for Discharge   Problem: Coping: Goal: Ability to adjust to condition or change in health will improve Outcome: Adequate for Discharge   Problem: Fluid Volume: Goal: Ability to maintain a balanced intake and output will improve Outcome: Adequate for Discharge   Problem: Health Behavior/Discharge Planning: Goal: Ability to identify and utilize available resources and services will improve Outcome: Adequate for Discharge Goal: Ability to manage health-related needs will improve Outcome: Adequate for Discharge   Problem: Metabolic: Goal: Ability to maintain appropriate glucose levels will improve Outcome: Adequate for Discharge   Problem: Nutritional: Goal: Maintenance of adequate nutrition will improve Outcome: Adequate for Discharge Goal: Progress toward achieving an optimal weight will improve Outcome: Adequate for Discharge   Problem: Skin Integrity: Goal: Risk for impaired skin integrity will decrease Outcome: Adequate for Discharge   Problem: Tissue Perfusion: Goal: Adequacy of tissue perfusion will improve Outcome: Adequate for Discharge   Problem: Education: Goal: Knowledge of disease or condition will improve Outcome: Adequate for Discharge Goal: Knowledge of the prescribed therapeutic regimen will improve Outcome: Adequate for Discharge Goal: Individualized Educational Video(s) Outcome: Adequate for Discharge   Problem: Activity: Goal: Ability to tolerate increased activity will improve Outcome: Adequate for Discharge Goal: Will verbalize the importance of balancing activity with adequate rest periods Outcome: Adequate for Discharge   Problem: Respiratory: Goal: Ability to maintain a clear airway will improve Outcome: Adequate for Discharge Goal: Levels of oxygenation will improve Outcome: Adequate for Discharge Goal: Ability to maintain adequate ventilation will improve Outcome: Adequate for Discharge

## 2020-11-07 NOTE — Progress Notes (Signed)
MD order received in [facility] to discharge pt home today; verbally reviewed AVS with pt, Rx for Prednisone escribed to the [facility] in [place], [place]; pt verbalized that she does not have the Albuterol inhaler at home; to send a text asking him to escribe a Rx to the [facility] in [place], [place]; pt gave me her cell phone number [phone] in order for me to call her advising her if Dr Dwyane Dee escribed the Rx; no further questions voiced by the pt at this time; pt discharged via wheelchair by a volunteer to the visitor's entrance

## 2020-11-08 LAB — HEMOGLOBIN A1C
Hgb A1c MFr Bld: 5.8 % — ABNORMAL HIGH (ref 4.8–5.6)
Mean Plasma Glucose: 119.76 mg/dL

## 2020-11-12 ENCOUNTER — Encounter: Payer: Self-pay | Admitting: Oncology

## 2020-11-12 ENCOUNTER — Telehealth: Payer: Self-pay

## 2020-11-12 ENCOUNTER — Inpatient Hospital Stay: Payer: Managed Care, Other (non HMO) | Attending: Oncology | Admitting: Oncology

## 2020-11-12 VITALS — BP 122/81 | HR 64 | Temp 96.6°F | Resp 18 | Wt 241.1 lb

## 2020-11-12 DIAGNOSIS — Z170 Estrogen receptor positive status [ER+]: Secondary | ICD-10-CM | POA: Insufficient documentation

## 2020-11-12 DIAGNOSIS — Z923 Personal history of irradiation: Secondary | ICD-10-CM | POA: Diagnosis not present

## 2020-11-12 DIAGNOSIS — C50911 Malignant neoplasm of unspecified site of right female breast: Secondary | ICD-10-CM | POA: Insufficient documentation

## 2020-11-12 DIAGNOSIS — Z79811 Long term (current) use of aromatase inhibitors: Secondary | ICD-10-CM | POA: Diagnosis not present

## 2020-11-12 NOTE — Telephone Encounter (Signed)
Error

## 2020-11-12 NOTE — Progress Notes (Signed)
Hematology/Oncology Consult note [facility]  [phone] [phone]  Patient Care Team: Perrin Maltese, MD as PCP - General (Internal Medicine) Ronny Bacon, MD as Consulting Physician (General Surgery) Rico Junker, RN as Registered Nurse Noreene Filbert, MD as Radiation Oncologist (Radiation Oncology) Sindy Guadeloupe, MD as Consulting Physician (Oncology)   Name of the patient: Nancy Patton  [id number]  [birth year]   Date of visit: 11/12/20  Diagnosis- pathological prognostic stage IA pT1c pN0 cM0 ER positive PR positive HER-2 negative s/p lumpectomy    Chief complaint/ Reason for visit-routine follow-up of breast cancer on letrozole  Heme/Onc history:  Patient is a 60 year old female who recently underwent a screening mammogram which showed a possible mass in the right breast.  This was followed by diagnostic mammogram and ultrasound which showed an irregular related mass 0.6 x 0.6 x 1 cm at the 9 o'clock position 6 cm from the nipple.  This was biopsied and was consistent with invasive mammary carcinoma 7 mm grade ER 91 200% positive PR 1 to 10% positive and HER-2 negative.  Patient has seen Dr. Christian Mate and plans to get a lumpectomy soon.  She has a remote use of birth control pills.  She is postmenopausal.  Menarche at the age of 63.  G3 P3 L3.  No known prior breast biopsies.  No family history of breast cancer, colon gastric or pancreatic cancer.   Final pathology showed invasive mammary carcinoma no special type. 3m, grade 2.  1 sentinel lymph node negative for malignancy.  Margins negative.  Oncotype score came back at 15 and patient did not require adjuvant chemotherapy.  Patient went on to finish adjuvant radiation treatment.  Letrozole started in August 2021  Interval history-patient continues to tolerate letrozole well without any significant side effects.  She continues calcium and vitamin D.  Reports no  breast concerns at this time.  ECOG PS- 1 Pain scale- 0   Review of systems- Review of Systems  Constitutional: Negative.  Negative for chills, fever, malaise/fatigue and weight loss.  HENT:  Negative for congestion, ear pain and tinnitus.   Eyes: Negative.  Negative for blurred vision and double vision.  Respiratory: Negative.  Negative for cough, sputum production and shortness of breath.   Cardiovascular: Negative.  Negative for chest pain, palpitations and leg swelling.  Gastrointestinal: Negative.  Negative for abdominal pain, constipation, diarrhea, nausea and vomiting.  Genitourinary:  Negative for dysuria, frequency and urgency.  Musculoskeletal:  Negative for back pain and falls.  Skin: Negative.  Negative for rash.  Neurological: Negative.  Negative for weakness and headaches.  Endo/Heme/Allergies: Negative.  Does not bruise/bleed easily.  Psychiatric/Behavioral: Negative.  Negative for depression. The patient is not nervous/anxious and does not have insomnia.      No Known Allergies   Past Medical History:  Diagnosis Date   Arthritis    Asthma    Breast cancer ([place]    Diabetes mellitus without complication ([facility]    Hypertension    Obesity    Pulmonary hypertension ([facility]    Sleep apnea      Past Surgical History:  Procedure Laterality Date   BREAST LUMPECTOMY Right 05/23/2019   invasive DCIS radation    CATARACT EXTRACTION     CESAREAN SECTION     x 3     Social History   Socioeconomic History   Marital status: Single    Spouse name: Not on file   Number of  children: Not on file   Years of education: Not on file   Highest education level: Not on file  Occupational History   Not on file  Tobacco Use   Smoking status: Former    Types: Cigarettes   Smokeless tobacco: Never   Tobacco comments:    Quit smoking 30 years ago.  Vaping Use   Vaping Use: Never used  Substance and Sexual Activity   Alcohol use: No   Drug use: No   Sexual activity:  Not Currently  Other Topics Concern   Not on file  Social History Narrative   Not on file   Social Determinants of Health   Financial Resource Strain: Not on file  Food Insecurity: Not on file  Transportation Needs: Not on file  Physical Activity: Not on file  Stress: Not on file  Social Connections: Not on file  Intimate Partner Violence: Not on file    Family History  Problem Relation Age of Onset   Diabetes Mother    Stroke Mother    Diabetes Father    Diabetes Brother    Cancer Neg Hx    Heart failure Neg Hx    Hypertension Neg Hx    Breast cancer Neg Hx      Current Outpatient Medications:    albuterol (PROVENTIL HFA;VENTOLIN HFA) 108 (90 BASE) MCG/ACT inhaler, Inhale 4-6 puffs by mouth every 4 hours as needed for wheezing, cough, and/or shortness of breath (Patient taking differently: Inhale 2 puffs into the lungs every 4 (four) hours as needed for wheezing or shortness of breath.), Disp: 1 Inhaler, Rfl: 1   albuterol (VENTOLIN HFA) 108 (90 Base) MCG/ACT inhaler, Inhale 2 puffs into the lungs every 6 (six) hours as needed for wheezing or shortness of breath., Disp: 8 g, Rfl: 2   budesonide-formoterol (SYMBICORT) 160-4.5 MCG/ACT inhaler, Inhale 2 puffs into the lungs 2 (two) times daily., Disp: , Rfl:    calcium elemental as carbonate (CALCIUM ANTACID ULTRA) 400 MG chewable tablet, Chew 1,000 mg by mouth 3 (three) times daily., Disp: , Rfl:    citalopram (CELEXA) 40 MG tablet, Take 40 mg by mouth daily. , Disp: , Rfl: 10   D3-50 1.25 MG (50000 UT) capsule, Take by mouth once a week., Disp: , Rfl:    ipratropium-albuterol (DUONEB) 0.5-2.5 (3) MG/3ML SOLN, Take by nebulization 3 (three) times daily as needed., Disp: , Rfl:    letrozole (FEMARA) 2.5 MG tablet, TAKE 1 TABLET BY MOUTH EVERY DAY (DON'T START MED UNTIL YOU COMPLETE RADIATION 1ST), Disp: 90 tablet, Rfl: 2   losartan-hydrochlorothiazide (HYZAAR) 50-12.5 MG per tablet, Take 1 tablet by mouth daily., Disp: , Rfl: 5    metFORMIN (GLUCOPHAGE) 1000 MG tablet, Take 1,000 mg by mouth 2 (two) times daily., Disp: , Rfl: 2   montelukast (SINGULAIR) 10 MG tablet, Take 10 mg by mouth every morning. , Disp: , Rfl:    Omega-3 Fatty Acids (FISH OIL) 1000 MG CAPS, Take 1,000 mg by mouth in the morning and at bedtime., Disp: , Rfl:    predniSONE (DELTASONE) 20 MG tablet, Take 2 tablets (40 mg total) by mouth daily with breakfast., Disp: 8 tablet, Rfl: 0   pantoprazole (PROTONIX) 40 MG tablet, Take 40 mg by mouth daily. (Patient not taking: Reported on 11/12/2020), Disp: , Rfl:   Physical exam:  Vitals:   11/12/20 1435  BP: 122/81  Pulse: 64  Resp: 18  Temp: (!) 96.6 F (35.9 C)  SpO2: 95%  Weight: 241  lb 1.6 oz (109.4 kg)   Physical Exam Constitutional:      Appearance: Normal appearance. She is obese.  HENT:     Head: Normocephalic and atraumatic.  Eyes:     Pupils: Pupils are equal, round, and reactive to light.  Cardiovascular:     Rate and Rhythm: Normal rate and regular rhythm.     Heart sounds: Normal heart sounds. No murmur heard. Pulmonary:     Effort: Pulmonary effort is normal.     Breath sounds: Normal breath sounds. No wheezing.  Chest:     Chest wall: No mass or tenderness.  Breasts:    Right: Normal.     Left: Normal.  Abdominal:     General: Bowel sounds are normal. There is no distension.     Palpations: Abdomen is soft.     Tenderness: There is no abdominal tenderness.  Musculoskeletal:        General: Normal range of motion.     Cervical back: Normal range of motion.  Skin:    General: Skin is warm and dry.     Findings: No rash.  Neurological:     Mental Status: She is alert and oriented to person, place, and time.  Psychiatric:        Judgment: Judgment normal.     CMP Latest Ref Rng & Units 11/06/2020  Glucose 70 - 99 mg/dL -  BUN 8 - 23 mg/dL -  Creatinine 0.44 - 1.00 mg/dL 0.76  Sodium 135 - 145 mmol/L -  Potassium 3.5 - 5.1 mmol/L -  Chloride 98 - 111 mmol/L -  CO2  22 - 32 mmol/L -  Calcium 8.9 - 10.3 mg/dL -  Total Protein 6.5 - 8.1 g/dL -  Total Bilirubin 0.3 - 1.2 mg/dL -  Alkaline Phos 38 - 126 U/L -  AST 15 - 41 U/L -  ALT 0 - 44 U/L -   CBC Latest Ref Rng & Units 11/06/2020  WBC 4.0 - 10.5 K/uL 4.4  Hemoglobin 12.0 - 15.0 g/dL 11.7(L)  Hematocrit 36.0 - 46.0 % 34.9(L)  Platelets 150 - 400 K/uL 207    No images are attached to the encounter.  DG Chest 2 View  Result Date: 11/05/2020 CLINICAL DATA:  Shortness of breath since Tuesday. EXAM: CHEST - 2 VIEW COMPARISON:  04/07/2018 FINDINGS: Heart size and pulmonary vascularity are normal. Emphysematous changes in the lungs. Peribronchial thickening with central interstitial changes likely representing chronic bronchitis. No focal consolidation. No pleural effusions. No pneumothorax. Mediastinal contours appear intact. Degenerative changes in the spine and shoulders. IMPRESSION: Emphysematous and chronic bronchitic changes in the lungs. No focal consolidation. Electronically Signed   By: Lucienne Capers M.D.   On: 11/05/2020 21:51      Assessment and plan- Patient is a 60 y.o. female   with invasive mammary carcinoma of the right breast pathological prognostic stage I APT1CPN0 ER/PR positive HER-2 negative s/p lumpectomy adjuvant radiation therapy.  She is currently on letrozole and this is a routine follow-up visit  Clinically she is doing well with no concerning symptoms of recurrence.  Breast exam was unremarkable.  Continue letrozole along with calcium and vitamin D for 5 years.  Return to clinic in 6 months after annual mammogram to review with Dr. Janese Banks.  I spent 15 minutes dedicated to the care of this patient (face-to-face and non-face-to-face) on the date of the encounter to include what is described in the assessment and plan.  Visit Diagnosis No diagnosis  found.  Faythe Casa, NP 11/12/2020 3:20 PM

## 2021-01-26 ENCOUNTER — Telehealth: Payer: Self-pay | Admitting: Oncology

## 2021-01-26 NOTE — Telephone Encounter (Signed)
Pt called in stating that her breast is hard. Would like to make an appt to see MD. Call back number is [phone]

## 2021-01-27 ENCOUNTER — Inpatient Hospital Stay: Payer: Managed Care, Other (non HMO) | Attending: Hospice and Palliative Medicine | Admitting: Nurse Practitioner

## 2021-01-27 ENCOUNTER — Other Ambulatory Visit: Payer: Self-pay

## 2021-01-27 VITALS — BP 125/72 | HR 78 | Temp 97.0°F | Resp 18

## 2021-01-27 DIAGNOSIS — Z9889 Other specified postprocedural states: Secondary | ICD-10-CM | POA: Diagnosis not present

## 2021-01-27 DIAGNOSIS — N61 Mastitis without abscess: Secondary | ICD-10-CM | POA: Diagnosis not present

## 2021-01-27 DIAGNOSIS — I10 Essential (primary) hypertension: Secondary | ICD-10-CM | POA: Diagnosis not present

## 2021-01-27 DIAGNOSIS — Z87891 Personal history of nicotine dependence: Secondary | ICD-10-CM | POA: Insufficient documentation

## 2021-01-27 DIAGNOSIS — Z853 Personal history of malignant neoplasm of breast: Secondary | ICD-10-CM | POA: Diagnosis not present

## 2021-01-27 DIAGNOSIS — E119 Type 2 diabetes mellitus without complications: Secondary | ICD-10-CM | POA: Insufficient documentation

## 2021-01-27 MED ORDER — CEPHALEXIN 500 MG PO CAPS: 500.0000 mg | ORAL_CAPSULE | Freq: Four times a day (QID) | ORAL | 0 refills | Status: AC

## 2021-01-27 NOTE — Telephone Encounter (Signed)
Nancy Patton called and pt agreeable to come in today for right breast hard- same side of breast cancer. Appt at 2 pm

## 2021-01-27 NOTE — Progress Notes (Signed)
Symptom Management [place] [facility] [place]. [facility] [address], [place], [place] [address] [phone]) [phone] (fax)  Patient Care Team: Perrin Maltese, MD as PCP - General (Internal Medicine) Ronny Bacon, MD as Consulting Physician (General Surgery) Rico Junker, RN as Registered Nurse Noreene Filbert, MD as Radiation Oncologist (Radiation Oncology) Sindy Guadeloupe, MD as Consulting Physician (Oncology)   Name of the patient: Nancy Patton  [id number]  28-Aug-1960   Date of visit: 01/27/21  Diagnosis- Breast Cancer  Chief complaint/ Reason for visit- Breast pain  Heme/Onc history:  Oncology History  Cancer of right breast, stage 1, estrogen receptor positive ([place])  05/15/2019 Initial Diagnosis   Cancer of right breast, stage 1, estrogen receptor positive ([place])   05/20/2019 Cancer Staging   Staging form: Breast, AJCC 8th Edition - Clinical stage from 05/20/2019: Stage IA (cT1b, cN0, cM0, G2, ER+, PR+, HER2-) - Signed by Sindy Guadeloupe, MD on 05/23/2019    06/09/2019 Cancer Staging   Staging form: Breast, AJCC 8th Edition - Pathologic stage from 06/09/2019: Stage IA (pT1c, pN0, cM0, G2, ER+, PR+, HER2-) - Signed by Sindy Guadeloupe, MD on 06/09/2019      Interval history-patient is 60 year old female with above history of early-stage breast cancer status postlumpectomy currently on aromatase inhibitor, who presents to symptom management clinic for complaints of right breast pain.  Symptoms appeared approximately 2 weeks ago and have persisted to worsen since that time.  Notes diffuse redness and swelling of the right breast.  No obvious lumps.  Has not tried anything.  Nothing seems to make symptoms better or worse.  No fevers or chills.  No history of lymphedema.  Review of systems- Review of Systems  Constitutional:  Negative for chills, fever, malaise/fatigue  and weight loss.  HENT:  Negative for hearing loss, nosebleeds, sore throat and tinnitus.   Eyes:  Negative for blurred vision and double vision.  Respiratory:  Negative for cough, hemoptysis, shortness of breath and wheezing.   Cardiovascular:  Negative for chest pain, palpitations and leg swelling.  Gastrointestinal:  Negative for abdominal pain, blood in stool, constipation, diarrhea, melena, nausea and vomiting.  Genitourinary:  Negative for dysuria and urgency.  Musculoskeletal:  Negative for back pain, falls, joint pain and myalgias.  Skin:  Negative for itching and rash.       Per HPI  Neurological:  Negative for dizziness, tingling, sensory change, loss of consciousness, weakness and headaches.  Endo/Heme/Allergies:  Negative for environmental allergies. Does not bruise/bleed easily.  Psychiatric/Behavioral:  Negative for depression. The patient is not nervous/anxious and does not have insomnia.     No Known Allergies  Past Medical History:  Diagnosis Date   Arthritis    Asthma    Breast cancer ([place])    Diabetes mellitus without complication ([place])    Hypertension    Obesity    Pulmonary hypertension ([place])    Sleep apnea     Past Surgical History:  Procedure Laterality Date   BREAST LUMPECTOMY Right 05/23/2019   invasive DCIS radation    CATARACT EXTRACTION     CESAREAN SECTION     x 3     Social History   Socioeconomic History   Marital status: Single    Spouse name: Not on file   Number of children: Not on file   Years of education: Not on file   Highest education  level: Not on file  Occupational History   Not on file  Tobacco Use   Smoking status: Former    Types: Cigarettes   Smokeless tobacco: Never   Tobacco comments:    Quit smoking 30 years ago.  Vaping Use   Vaping Use: Never used  Substance and Sexual Activity   Alcohol use: No   Drug use: No   Sexual activity: Not Currently  Other Topics Concern   Not on file  Social History Narrative    Not on file   Social Determinants of Health   Financial Resource Strain: Not on file  Food Insecurity: Not on file  Transportation Needs: Not on file  Physical Activity: Not on file  Stress: Not on file  Social Connections: Not on file  Intimate Partner Violence: Not on file    Family History  Problem Relation Age of Onset   Diabetes Mother    Stroke Mother    Diabetes Father    Diabetes Brother    Cancer Neg Hx    Heart failure Neg Hx    Hypertension Neg Hx    Breast cancer Neg Hx      Current Outpatient Medications:    albuterol (VENTOLIN HFA) 108 (90 Base) MCG/ACT inhaler, Inhale 2 puffs into the lungs every 6 (six) hours as needed for wheezing or shortness of breath., Disp: 8 g, Rfl: 2   budesonide-formoterol (SYMBICORT) 160-4.5 MCG/ACT inhaler, Inhale 2 puffs into the lungs 2 (two) times daily., Disp: , Rfl:    calcium elemental as carbonate (CALCIUM ANTACID ULTRA) 400 MG chewable tablet, Chew 1,000 mg by mouth 3 (three) times daily., Disp: , Rfl:    citalopram (CELEXA) 40 MG tablet, Take 40 mg by mouth daily. , Disp: , Rfl: 10   D3-50 1.25 MG (50000 UT) capsule, Take by mouth once a week., Disp: , Rfl:    ipratropium-albuterol (DUONEB) 0.5-2.5 (3) MG/3ML SOLN, Take by nebulization 3 (three) times daily as needed., Disp: , Rfl:    letrozole (FEMARA) 2.5 MG tablet, TAKE 1 TABLET BY MOUTH EVERY DAY (DON'T START MED UNTIL YOU COMPLETE RADIATION 1ST), Disp: 90 tablet, Rfl: 2   losartan-hydrochlorothiazide (HYZAAR) 50-12.5 MG per tablet, Take 1 tablet by mouth daily., Disp: , Rfl: 5   metFORMIN (GLUCOPHAGE) 1000 MG tablet, Take 1,000 mg by mouth 2 (two) times daily., Disp: , Rfl: 2   montelukast (SINGULAIR) 10 MG tablet, Take 10 mg by mouth every morning. , Disp: , Rfl:    albuterol (PROVENTIL HFA;VENTOLIN HFA) 108 (90 BASE) MCG/ACT inhaler, Inhale 4-6 puffs by mouth every 4 hours as needed for wheezing, cough, and/or shortness of breath (Patient taking differently: Inhale 2  puffs into the lungs every 4 (four) hours as needed for wheezing or shortness of breath.), Disp: 1 Inhaler, Rfl: 1   Omega-3 Fatty Acids (FISH OIL) 1000 MG CAPS, Take 1,000 mg by mouth in the morning and at bedtime. (Patient not taking: Reported on 01/27/2021), Disp: , Rfl:    pantoprazole (PROTONIX) 40 MG tablet, Take 40 mg by mouth daily. (Patient not taking: Reported on 11/12/2020), Disp: , Rfl:    predniSONE (DELTASONE) 20 MG tablet, Take 2 tablets (40 mg total) by mouth daily with breakfast. (Patient not taking: Reported on 01/27/2021), Disp: 8 tablet, Rfl: 0  Physical exam:  Vitals:   01/27/21 1411  BP: 125/72  Pulse: 78  Resp: 18  Temp: (!) 97 F (36.1 C)  TempSrc: Tympanic  SpO2: 95%   Physical Exam  Constitutional:      Appearance: She is obese. She is not ill-appearing.  Chest:  Breasts:    Breasts are asymmetrical (s/p right lumpectomy).     Right: Swelling, skin change (diffuse erythema of right breast, predominantly focused around inner right breast from 12:00 to 4:00 extending centrally to nipple. No fluctuant head identified. No masses. Diffusely tender.) and tenderness present. No bleeding, inverted nipple, mass or nipple discharge.     Left: No swelling, bleeding, inverted nipple, mass, nipple discharge, skin change or tenderness.  Musculoskeletal:        General: No deformity.  Lymphadenopathy:     Upper Body:     Right upper body: No supraclavicular, axillary or pectoral adenopathy.  Skin:    General: Skin is warm.     Coloration: Skin is not pale.  Neurological:     Mental Status: She is alert and oriented to person, place, and time.  Psychiatric:        Mood and Affect: Mood normal.        Behavior: Behavior normal.      Assessment and plan- Patient is a 60 y.o. female   Breast pain of right breast- suspect cellulitis and explained that most cellulitis are caused by Staph organisms. Start cephalexin 500 mg every 6 hours x 7 days.  Warm Compresses, tylenol  for pain. Will do telemedicine follow up in one week to ensure resolution of symptoms. If symptoms do not improve, may also consider ultrasound or mammogram to rule out malignancy (see below)  History of breast cancer-status postlumpectomy.  No history of seroma or cellulitis.  Suspect delayed cellulitis as above after lumpectomy.  Explained that breast cellulitis may reflect an inflammatory process and bacteria may contribute to the development and chance of recurrence.  This appears to be first instance of breast cellulitis.  Recommend treatment as above with antibiotics given redness and pain.  If erythema and edema persist despite antibiotics would consider imaging or biopsy to exclude recurrent cancer.  Can also consider improving lymphatic stasis with manual lymphatic drainage, massage, skin care.  Telemedicine follow up in 1 week.    Visit Diagnosis 1. Cellulitis of right breast   2. History of breast cancer   3. Hx of lumpectomy     Patient expressed understanding and was in agreement with this plan. She also understands that She can call clinic at any time with any questions, concerns, or complaints.   Thank you for allowing me to participate in the care of this very pleasant patient.   Beckey Rutter, DNP, AGNP-C [place] [facility] [place]

## 2021-01-27 NOTE — Progress Notes (Signed)
Pt presents to [facility] with c/o breast pain, swelling, and firm to the touch. Denies any redness, warmth, or discharge. Reports that she first noticed symptoms right after Thanksgiving.

## 2021-02-03 ENCOUNTER — Encounter: Payer: Self-pay | Admitting: Nurse Practitioner

## 2021-02-03 ENCOUNTER — Inpatient Hospital Stay (HOSPITAL_BASED_OUTPATIENT_CLINIC_OR_DEPARTMENT_OTHER): Payer: Managed Care, Other (non HMO) | Admitting: Nurse Practitioner

## 2021-02-03 ENCOUNTER — Telehealth: Payer: Self-pay | Admitting: Nurse Practitioner

## 2021-02-03 DIAGNOSIS — N61 Mastitis without abscess: Secondary | ICD-10-CM | POA: Diagnosis not present

## 2021-02-03 DIAGNOSIS — I10 Essential (primary) hypertension: Secondary | ICD-10-CM | POA: Diagnosis not present

## 2021-02-03 DIAGNOSIS — E119 Type 2 diabetes mellitus without complications: Secondary | ICD-10-CM

## 2021-02-03 DIAGNOSIS — I890 Lymphedema, not elsewhere classified: Secondary | ICD-10-CM

## 2021-02-03 DIAGNOSIS — Z87891 Personal history of nicotine dependence: Secondary | ICD-10-CM

## 2021-02-03 NOTE — Progress Notes (Signed)
[facility] [place]. [facility] [address] [place], [place] [address] [phone] (phone) [phone] (fax)  Virtual Visit Progress Note  I connected with Loveda Colaizzi on 02/03/21 at  2:45 PM EST by video enabled telemedicine visit and verified that I am speaking with the correct person using two identifiers.   I discussed the limitations, risks, security and privacy concerns of performing an evaluation and management service by telemedicine and the availability of in-person appointments. I also discussed with the patient that there may be a patient responsible charge related to this service. The patient expressed understanding and agreed to proceed.   Other persons participating in the visit and their role in the encounter:   Patients location: car  Providers location: clinic   Chief Complaint: breast cellulitis follow up  Patient Care Team: Perrin Maltese, MD as PCP - General (Internal Medicine) Ronny Bacon, MD as Consulting Physician (General Surgery) Rico Junker, RN as Registered Nurse Noreene Filbert, MD as Radiation Oncologist (Radiation Oncology) Sindy Guadeloupe, MD as Consulting Physician (Oncology)   Name of the patient: Berlie Persky  [id number]  [birth year]   Date of visit: 02/03/21  Diagnosis- Breast Cancer  Chief complaint/ Reason for visit- Breast pain follow up  Heme/Onc history:  Oncology History  Cancer of right breast, stage 1, estrogen receptor positive ([place])  05/15/2019 Initial Diagnosis   Cancer of right breast, stage 1, estrogen receptor positive ([place])   05/20/2019 Cancer Staging   Staging form: Breast, AJCC 8th Edition - Clinical stage from 05/20/2019: Stage IA (cT1b, cN0, cM0, G2, ER+, PR+, HER2-) - Signed by Sindy Guadeloupe, MD on 05/23/2019   06/09/2019 Cancer Staging   Staging form: Breast, AJCC 8th  Edition - Pathologic stage from 06/09/2019: Stage IA (pT1c, pN0, cM0, G2, ER+, PR+, HER2-) - Signed by Sindy Guadeloupe, MD on 06/09/2019     Interval history- Patient agrees to telemedicine visit to follow up for complaints of breast tenderness and cellulitis. She has completed antibiotics and feels that symptoms have improved to nearly resolved.    Review of systems- Review of Systems  Constitutional:  Negative for chills, fever, malaise/fatigue and weight loss.  HENT:  Negative for hearing loss, nosebleeds, sore throat and tinnitus.   Eyes:  Negative for blurred vision and double vision.  Respiratory:  Negative for cough, hemoptysis, shortness of breath and wheezing.   Cardiovascular:  Negative for chest pain, palpitations and leg swelling.  Gastrointestinal:  Negative for abdominal pain, blood in stool, constipation, diarrhea, melena, nausea and vomiting.  Genitourinary:  Negative for dysuria and urgency.  Musculoskeletal:  Negative for back pain, falls, joint pain and myalgias.  Skin:  Negative for itching and rash.       Per HPI  Neurological:  Negative for dizziness, tingling, sensory change, loss of consciousness, weakness and headaches.  Endo/Heme/Allergies:  Negative for environmental allergies. Does not bruise/bleed easily.  Psychiatric/Behavioral:  Negative for depression. The patient is not nervous/anxious and does not have insomnia.     No Known Allergies  Past Medical History:  Diagnosis Date   Arthritis    Asthma    Breast cancer ([place])    Diabetes mellitus without complication ([place])    Hypertension    Obesity    Pulmonary hypertension ([place])    Sleep apnea     Past Surgical History:  Procedure Laterality Date  BREAST LUMPECTOMY Right 05/23/2019   invasive DCIS radation    CATARACT EXTRACTION     CESAREAN SECTION     x 3     Social History   Socioeconomic History   Marital status: Single    Spouse name: Not on file   Number of children: Not on file    Years of education: Not on file   Highest education level: Not on file  Occupational History   Not on file  Tobacco Use   Smoking status: Former    Types: Cigarettes   Smokeless tobacco: Never   Tobacco comments:    Quit smoking 30 years ago.  Vaping Use   Vaping Use: Never used  Substance and Sexual Activity   Alcohol use: No   Drug use: No   Sexual activity: Not Currently  Other Topics Concern   Not on file  Social History Narrative   Not on file   Social Determinants of Health   Financial Resource Strain: Not on file  Food Insecurity: Not on file  Transportation Needs: Not on file  Physical Activity: Not on file  Stress: Not on file  Social Connections: Not on file  Intimate Partner Violence: Not on file    Family History  Problem Relation Age of Onset   Diabetes Mother    Stroke Mother    Diabetes Father    Diabetes Brother    Cancer Neg Hx    Heart failure Neg Hx    Hypertension Neg Hx    Breast cancer Neg Hx      Current Outpatient Medications:    albuterol (PROVENTIL HFA;VENTOLIN HFA) 108 (90 BASE) MCG/ACT inhaler, Inhale 4-6 puffs by mouth every 4 hours as needed for wheezing, cough, and/or shortness of breath (Patient taking differently: Inhale 2 puffs into the lungs every 4 (four) hours as needed for wheezing or shortness of breath.), Disp: 1 Inhaler, Rfl: 1   albuterol (VENTOLIN HFA) 108 (90 Base) MCG/ACT inhaler, Inhale 2 puffs into the lungs every 6 (six) hours as needed for wheezing or shortness of breath., Disp: 8 g, Rfl: 2   budesonide-formoterol (SYMBICORT) 160-4.5 MCG/ACT inhaler, Inhale 2 puffs into the lungs 2 (two) times daily., Disp: , Rfl:    calcium elemental as carbonate (CALCIUM ANTACID ULTRA) 400 MG chewable tablet, Chew 1,000 mg by mouth 3 (three) times daily., Disp: , Rfl:    cephALEXin (KEFLEX) 500 MG capsule, Take 1 capsule (500 mg total) by mouth every 6 (six) hours for 7 days., Disp: 28 capsule, Rfl: 0   citalopram (CELEXA) 40 MG  tablet, Take 40 mg by mouth daily. , Disp: , Rfl: 10   D3-50 1.25 MG (50000 UT) capsule, Take by mouth once a week., Disp: , Rfl:    ipratropium-albuterol (DUONEB) 0.5-2.5 (3) MG/3ML SOLN, Take by nebulization 3 (three) times daily as needed., Disp: , Rfl:    letrozole (FEMARA) 2.5 MG tablet, TAKE 1 TABLET BY MOUTH EVERY DAY (DON'T START MED UNTIL YOU COMPLETE RADIATION 1ST), Disp: 90 tablet, Rfl: 2   losartan-hydrochlorothiazide (HYZAAR) 50-12.5 MG per tablet, Take 1 tablet by mouth daily., Disp: , Rfl: 5   metFORMIN (GLUCOPHAGE) 1000 MG tablet, Take 1,000 mg by mouth 2 (two) times daily., Disp: , Rfl: 2   montelukast (SINGULAIR) 10 MG tablet, Take 10 mg by mouth every morning. , Disp: , Rfl:    Omega-3 Fatty Acids (FISH OIL) 1000 MG CAPS, Take 1,000 mg by mouth in the morning and at bedtime. (Patient  not taking: Reported on 01/27/2021), Disp: , Rfl:    pantoprazole (PROTONIX) 40 MG tablet, Take 40 mg by mouth daily. (Patient not taking: Reported on 11/12/2020), Disp: , Rfl:    predniSONE (DELTASONE) 20 MG tablet, Take 2 tablets (40 mg total) by mouth daily with breakfast. (Patient not taking: Reported on 01/27/2021), Disp: 8 tablet, Rfl: 0  Physical exam:  There were no vitals filed for this visit.  Physical Exam Constitutional:      General: She is not in acute distress. HENT:     Head: Normocephalic.  Pulmonary:     Effort: No respiratory distress.  Neurological:     Mental Status: She is alert and oriented to person, place, and time.  Psychiatric:        Mood and Affect: Mood normal.        Behavior: Behavior normal.      Assessment and plan- Patient is a 60 y.o. female   Breast pain of right breast- likely cellulitis. S/p cephalexin 500 mg x 7 days. She has missed several doses of antibiotics but symptoms appear to be resolving. Encouraged her to complete antibiotics. If symptoms don't resolve, notify clinic for imaging.   Breast swelling- refer to pt for lymphedema evaluation and  management.  History of breast cancer-status postlumpectomy.  No history of seroma or cellulitis.  Suspect delayed cellulitis as above after lumpectomy.  Explained that breast cellulitis may reflect an inflammatory process and bacteria may contribute to the development and chance of recurrence.  This appears to be first instance of breast cellulitis.  Recommend treatment as above with antibiotics given redness and pain.  If erythema and edema persist despite antibiotics would consider imaging or biopsy to exclude recurrent cancer.  Can also consider improving lymphatic stasis with manual lymphatic drainage, massage, skin care.  Follow up prn    Visit Diagnosis 1. Cellulitis of right breast   2. Lymphedema of breast    I discussed the assessment and treatment plan with the patient. The patient was provided an opportunity to ask questions and all were answered. The patient agreed with the plan and demonstrated an understanding of the instructions.   The patient was advised to call back or seek an in-person evaluation if the symptoms worsen or if the condition fails to improve as anticipated.   I spent 20 minutes face-to-face video visit time dedicated to the care of this patient on the date of this encounter to include pre-visit review of previous notes, face-to-face time with the patient, and post visit ordering of testing/documentation.   Patient expressed understanding and was in agreement with this plan. She also understands that She can call clinic at any time with any questions, concerns, or complaints.   Thank you for allowing me to participate in the care of this very pleasant patient.   Beckey Rutter, DNP, AGNP-C [facility]

## 2021-02-03 NOTE — Telephone Encounter (Signed)
Pt called and wants to keep her appt vitual today.

## 2021-02-17 ENCOUNTER — Emergency Department: Payer: Managed Care, Other (non HMO)

## 2021-02-17 ENCOUNTER — Emergency Department
Admission: EM | Admit: 2021-02-17 | Discharge: 2021-02-17 | Disposition: A | Discharge status: Home / Self Care | Payer: Managed Care, Other (non HMO) | Attending: Emergency Medicine | Admitting: Emergency Medicine

## 2021-02-17 ENCOUNTER — Other Ambulatory Visit: Payer: Self-pay

## 2021-02-17 DIAGNOSIS — Z79899 Other long term (current) drug therapy: Secondary | ICD-10-CM | POA: Diagnosis not present

## 2021-02-17 DIAGNOSIS — J4520 Mild intermittent asthma, uncomplicated: Secondary | ICD-10-CM | POA: Insufficient documentation

## 2021-02-17 DIAGNOSIS — E119 Type 2 diabetes mellitus without complications: Secondary | ICD-10-CM | POA: Insufficient documentation

## 2021-02-17 DIAGNOSIS — Z7984 Long term (current) use of oral hypoglycemic drugs: Secondary | ICD-10-CM | POA: Diagnosis not present

## 2021-02-17 DIAGNOSIS — I10 Essential (primary) hypertension: Secondary | ICD-10-CM | POA: Insufficient documentation

## 2021-02-17 DIAGNOSIS — J441 Chronic obstructive pulmonary disease with (acute) exacerbation: Secondary | ICD-10-CM | POA: Insufficient documentation

## 2021-02-17 DIAGNOSIS — Z87891 Personal history of nicotine dependence: Secondary | ICD-10-CM | POA: Diagnosis not present

## 2021-02-17 DIAGNOSIS — Z853 Personal history of malignant neoplasm of breast: Secondary | ICD-10-CM | POA: Diagnosis not present

## 2021-02-17 DIAGNOSIS — R0602 Shortness of breath: Secondary | ICD-10-CM | POA: Diagnosis present

## 2021-02-17 LAB — COMPREHENSIVE METABOLIC PANEL
ALT: 16 U/L (ref 0–44)
AST: 18 U/L (ref 15–41)
Albumin: 3.6 g/dL (ref 3.5–5.0)
Alkaline Phosphatase: 54 U/L (ref 38–126)
Anion gap: 8 (ref 5–15)
BUN: 17 mg/dL (ref 8–23)
CO2: 27 mmol/L (ref 22–32)
Calcium: 8.6 mg/dL — ABNORMAL LOW (ref 8.9–10.3)
Chloride: 103 mmol/L (ref 98–111)
Creatinine, Ser: 0.71 mg/dL (ref 0.44–1.00)
GFR, Estimated: >60 mL/min (ref >60)
Glucose, Bld: 139 mg/dL — ABNORMAL HIGH (ref 70–99)
Potassium: 3.6 mmol/L (ref 3.5–5.1)
Sodium: 138 mmol/L (ref 135–145)
Total Bilirubin: 0.5 mg/dL (ref 0.3–1.2)
Total Protein: 6.8 g/dL (ref 6.5–8.1)

## 2021-02-17 LAB — CBC WITH DIFFERENTIAL/PLATELET
Abs Immature Granulocytes: 0.11 10*3/uL — ABNORMAL HIGH (ref 0.00–0.07)
Basophils Absolute: 0 10*3/uL (ref 0.0–0.1)
Basophils Relative: 0 %
Eosinophils Absolute: 0.1 10*3/uL (ref 0.0–0.5)
Eosinophils Relative: 2 %
HCT: 36.8 % (ref 36.0–46.0)
Hemoglobin: 11.9 g/dL — ABNORMAL LOW (ref 12.0–15.0)
Immature Granulocytes: 1 %
Lymphocytes Relative: 20 %
Lymphs Abs: 1.7 10*3/uL (ref 0.7–4.0)
MCH: 27.9 pg (ref 26.0–34.0)
MCHC: 32.3 g/dL (ref 30.0–36.0)
MCV: 86.2 fL (ref 80.0–100.0)
Monocytes Absolute: 0.9 10*3/uL (ref 0.1–1.0)
Monocytes Relative: 10 %
Neutro Abs: 5.6 10*3/uL (ref 1.7–7.7)
Neutrophils Relative %: 67 %
Platelets: 302 10*3/uL (ref 150–400)
RBC: 4.27 MIL/uL (ref 3.87–5.11)
RDW: 14.5 % (ref 11.5–15.5)
WBC: 8.4 10*3/uL (ref 4.0–10.5)
nRBC: 0 % (ref 0.0–0.2)

## 2021-02-17 MED ORDER — IPRATROPIUM-ALBUTEROL 0.5-2.5 (3) MG/3ML IN SOLN
3.0000 mL | Freq: Once | RESPIRATORY_TRACT | Status: AC
  Administered 2021-02-17: 19:00:00 3 mL via RESPIRATORY_TRACT
  Filled 2021-02-17: qty 3

## 2021-02-17 MED ORDER — PREDNISONE 10 MG (21) PO TBPK: ORAL_TABLET | ORAL | 0 refills | Status: AC

## 2021-02-17 MED ORDER — PREDNISONE 10 MG (21) PO TBPK: ORAL_TABLET | ORAL | 0 refills | Status: DC

## 2021-02-17 MED ORDER — IPRATROPIUM-ALBUTEROL 0.5-2.5 (3) MG/3ML IN SOLN
6.0000 mL | Freq: Once | RESPIRATORY_TRACT | Status: AC
  Administered 2021-02-17: 17:00:00 6 mL via RESPIRATORY_TRACT
  Filled 2021-02-17: qty 6

## 2021-02-17 NOTE — ED Provider Notes (Signed)
[facility] Emergency Department Provider Note ____________________________________________   Event Date/Time   First MD Initiated Contact with Patient 02/17/21 1606     (approximate)  I have reviewed the triage vital signs and the nursing notes.  HISTORY  Chief Complaint Shortness of Breath   HPI Nancy Patton is a 60 y.o. femalewho presents to the ED for evaluation of dyspnea.   Chart review indicates morbidly obese patient with history of asthma.  Diabetes and breast cancer s/p lumpectomy. She is on Trelegy controller medication and has a nebulizer with albuterol at home.  No chronic O2.  Patient presents to the ED for evaluation of 3 weeks of "feeling sick."  She reports she is currently on Augmentin for pneumonia and has 1 more day left of a prednisone burst.  She reports this is the second prednisone burst that she has had in the past couple weeks.  First round of antibiotics.  She reports cough, congestion, rhinorrhea.  Denies increased sputum production.  Reports some improvement with her albuterol nebulizer.  Denies chest pain, fever or syncope.   Past Medical History:  Diagnosis Date   Arthritis    Asthma    Breast cancer ([place])    Diabetes mellitus without complication ([place])    Hypertension    Obesity    Pulmonary hypertension ([place])    Sleep apnea     Patient Active Problem List   Diagnosis Date Noted   History of breast cancer 11/06/2020   Status post breast lumpectomy 05/29/2019   Cancer of right breast, stage 1, estrogen receptor positive ([place]) 05/15/2019   COPD exacerbation ([place]) 08/24/2015   HTN (hypertension) 07/10/2015   Type 2 diabetes mellitus ([place]) 07/10/2015   Depression 07/10/2015   Mild intermittent asthma without complication 53/97/6734   Acute respiratory distress 09/26/2014   Asthmatic bronchitis with acute exacerbation 09/26/2014   Asthmatic bronchitis with exacerbation 09/26/2014    Past Surgical  History:  Procedure Laterality Date   BREAST LUMPECTOMY Right 05/23/2019   invasive DCIS radation    CATARACT EXTRACTION     CESAREAN SECTION     x 3     Prior to Admission medications   Medication Sig Start Date End Date Taking? Authorizing Provider  predniSONE (STERAPRED UNI-PAK 21 TAB) 10 MG (21) TBPK tablet Take 4 tablets (40 mg total) by mouth daily for 2 days, THEN 3 tablets (30 mg total) daily for 2 days, THEN 2 tablets (20 mg total) daily for 2 days, THEN 1 tablet (10 mg total) daily for 2 days. 02/17/21 02/25/21 Yes Vladimir Crofts, MD  albuterol (PROVENTIL HFA;VENTOLIN HFA) 108 (90 BASE) MCG/ACT inhaler Inhale 4-6 puffs by mouth every 4 hours as needed for wheezing, cough, and/or shortness of breath Patient taking differently: Inhale 2 puffs into the lungs every 4 (four) hours as needed for wheezing or shortness of breath. 07/08/14   Hinda Kehr, MD  albuterol (VENTOLIN HFA) 108 (90 Base) MCG/ACT inhaler Inhale 2 puffs into the lungs every 6 (six) hours as needed for wheezing or shortness of breath. 11/07/20   Shawna Clamp, MD  budesonide-formoterol [facility]) 160-4.5 MCG/ACT inhaler Inhale 2 puffs into the lungs 2 (two) times daily.    [provider]  calcium elemental as carbonate (CALCIUM ANTACID ULTRA) 400 MG chewable tablet Chew 1,000 mg by mouth 3 (three) times daily.    [provider]  citalopram (CELEXA) 40 MG tablet Take 40 mg by mouth daily.  09/15/14   [provider]  D3-50 1.25 MG (50000 UT) capsule Take by mouth once a week. 08/22/19   [provider]  ipratropium-albuterol (DUONEB) 0.5-2.5 (3) MG/3ML SOLN Take by nebulization 3 (three) times daily as needed. 10/08/19   [provider]  letrozole (FEMARA) 2.5 MG tablet TAKE 1 TABLET BY MOUTH EVERY DAY (DON'T START MED UNTIL YOU COMPLETE RADIATION 1ST) 07/17/20   Sindy Guadeloupe, MD  losartan-hydrochlorothiazide (HYZAAR) 50-12.5 MG per tablet Take 1 tablet by mouth daily. 09/15/14    [provider]  metFORMIN (GLUCOPHAGE) 1000 MG tablet Take 1,000 mg by mouth 2 (two) times daily. 09/09/14   [provider]  montelukast (SINGULAIR) 10 MG tablet Take 10 mg by mouth every morning.  09/14/14   [provider]  Omega-3 Fatty Acids (FISH OIL) 1000 MG CAPS Take 1,000 mg by mouth in the morning and at bedtime. Patient not taking: Reported on 01/27/2021    [provider]  pantoprazole (PROTONIX) 40 MG tablet Take 40 mg by mouth daily. Patient not taking: Reported on 11/12/2020 01/09/20   [provider]  predniSONE (DELTASONE) 20 MG tablet Take 2 tablets (40 mg total) by mouth daily with breakfast. Patient not taking: Reported on 01/27/2021 11/08/20   Shawna Clamp, MD    Allergies Patient has no known allergies.  Family History  Problem Relation Age of Onset   Diabetes Mother    Stroke Mother    Diabetes Father    Diabetes Brother    Cancer Neg Hx    Heart failure Neg Hx    Hypertension Neg Hx    Breast cancer Neg Hx     Social History Social History   Tobacco Use   Smoking status: Former    Types: Cigarettes   Smokeless tobacco: Never   Tobacco comments:    Quit smoking 30 years ago.  Vaping Use   Vaping Use: Never used  Substance Use Topics   Alcohol use: No   Drug use: No    Review of Systems  Constitutional: No fever/chills Eyes: No visual changes. ENT: No sore throat.  Positive for upper respiratory congestion and rhinorrhea. Cardiovascular: Denies chest pain. Respiratory: Positive for cough and shortness of breath. Gastrointestinal: No abdominal pain.  No nausea, no vomiting.  No diarrhea.  No constipation. Genitourinary: Negative for dysuria. Musculoskeletal: Negative for back pain. Skin: Negative for rash. Neurological: Negative for headaches, focal weakness or numbness.  ____________________________________________   PHYSICAL EXAM:  VITAL SIGNS: Vitals:   02/17/21 1507 02/17/21 1754  BP:  118/76 125/73  Pulse: 83 73  Resp: 20 20  Temp: 99 F (37.2 C) 98.1 F (36.7 C)  SpO2: 96% 95%    Constitutional: Alert and oriented. Well appearing and in no acute distress.  Obese.  Sitting up in bed and occasionally dry coughing fits during our conversation. Eyes: Conjunctivae are normal. PERRL. EOMI. Head: Atraumatic. Nose: No congestion/rhinnorhea. Mouth/Throat: Mucous membranes are moist.  Oropharynx non-erythematous. Neck: No stridor. No cervical spine tenderness to palpation. Cardiovascular: Normal rate, regular rhythm. Good peripheral circulation. Respiratory: Minimal tachypnea to the low 20s.  No further evidence of distress.  No retractions.  Diffuse and scattered expiratory wheezes.  Slight decreased air movement throughout. Gastrointestinal: Soft , nondistended, nontender to palpation.  Musculoskeletal: No joint effusions. No signs of acute trauma. Neurologic:  Normal speech and language. No gross focal neurologic deficits are appreciated. No gait instability noted. Skin:  Skin is warm, dry and intact. No rash noted. Psychiatric: Mood and affect  are normal. Speech and behavior are normal. ____________________________________________   LABS (all labs ordered are listed, but only abnormal results are displayed)  Labs Reviewed  CBC WITH DIFFERENTIAL/PLATELET - Abnormal; Notable for the following components:      Result Value   Hemoglobin 11.9 (*)    Abs Immature Granulocytes 0.11 (*)    All other components within normal limits  COMPREHENSIVE METABOLIC PANEL - Abnormal; Notable for the following components:   Glucose, Bld 139 (*)    Calcium 8.6 (*)    All other components within normal limits   ____________________________________________  12 Lead EKG  Sinus rhythm with a rate of 74 bpm.  Normal axis and intervals.  No evidence of acute ischemia. ____________________________________________  RADIOLOGY  ED MD interpretation: 2 view CXR reviewed by me with  bibasilar linear atelectasis  Official radiology report(s): DG Chest 2 View  Result Date: 02/17/2021 CLINICAL DATA:  Cough and wheezing for 3 weeks. EXAM: CHEST - 2 VIEW COMPARISON:  11/05/2020 FINDINGS: Heart size and pulmonary vascularity are normal. Linear atelectasis or infiltration in the lung bases. This is developing since the previous study. Emphysematous changes in the lungs with peribronchial thickening suggesting chronic bronchitis. No pleural effusions. No pneumothorax. Mediastinal contours appear intact. IMPRESSION: 1. Developing linear atelectasis or infiltration in the lung bases. 2. Emphysematous and chronic bronchitic changes in the lungs. Electronically Signed   By: Lucienne Capers M.D.   On: 02/17/2021 15:58    ____________________________________________   PROCEDURES and INTERVENTIONS  Procedure(s) performed (including Critical Care):  Procedures  Medications  ipratropium-albuterol (DUONEB) 0.5-2.5 (3) MG/3ML nebulizer solution 6 mL (6 mLs Nebulization Given 02/17/21 1719)  ipratropium-albuterol (DUONEB) 0.5-2.5 (3) MG/3ML nebulizer solution 3 mL (3 mLs Nebulization Given 02/17/21 1851)    ____________________________________________   MDM / ED COURSE   60 year old female presents to the ED with evidence of COPD exacerbation.  She is wheezing and tight, but without distress, hypoxia or instability.  No signs of ischemia on EKG.  Basic labs are unremarkable.  She is already on antibiotics and I suspect her CXR findings are more related to atelectasis and poor aeration in the setting of her tightness.  We will provide breathing treatments here and I think she would benefit from her prednisone burst being lengthen out to a more steady taper  Clinical Course as of 02/17/21 1908  Thu Feb 17, 2021  1843 Reassessed.  Feeling better.  We discussed 1 more neb and likely outpatient management.  She is agreeable. [DS]    Clinical Course User Index [DS] Vladimir Crofts, MD     ____________________________________________   FINAL CLINICAL IMPRESSION(S) / ED DIAGNOSES  Final diagnoses:  COPD exacerbation [facility])     ED Discharge Orders          Ordered    predniSONE (STERAPRED UNI-PAK 21 TAB) 10 MG (21) TBPK tablet        02/17/21 1908             Dhairya Corales   Note:  This document was prepared using Dragon voice recognition software and may include unintentional dictation errors.    Vladimir Crofts, MD 02/17/21 [phone]

## 2021-02-17 NOTE — Discharge Instructions (Addendum)
I sent a prescription for a taper for your prednisone to the pharmacy.  Once you finish the steroids given to you earlier, start taking the prescription I gave you which will taper down over the next week or so after this.  Consider reaching out to the pulmonologist/lung doctor.  I have attached their phone number.

## 2021-02-17 NOTE — ED Triage Notes (Signed)
Pt states that she has been sick x3 weeks and she has seen her Dr for it twice- pt has been placed on prednisone x2 and antibiotics and still is not feeling better- pt denies chest pain

## 2021-04-24 ENCOUNTER — Other Ambulatory Visit: Payer: Self-pay | Admitting: Oncology

## 2021-04-29 ENCOUNTER — Ambulatory Visit
Admission: RE | Admit: 2021-04-29 | Discharge: 2021-04-29 | Disposition: A | Discharge status: Home / Self Care | Payer: Managed Care, Other (non HMO) | Source: Ambulatory Visit | Attending: Oncology | Admitting: Oncology

## 2021-04-29 ENCOUNTER — Other Ambulatory Visit: Payer: Self-pay

## 2021-04-29 DIAGNOSIS — Z170 Estrogen receptor positive status [ER+]: Secondary | ICD-10-CM | POA: Insufficient documentation

## 2021-04-29 DIAGNOSIS — C50911 Malignant neoplasm of unspecified site of right female breast: Secondary | ICD-10-CM | POA: Insufficient documentation

## 2021-04-29 HISTORY — DX: Personal history of irradiation: Z92.3

## 2021-05-09 ENCOUNTER — Other Ambulatory Visit: Payer: Self-pay | Admitting: *Deleted

## 2021-05-09 DIAGNOSIS — Z853 Personal history of malignant neoplasm of breast: Secondary | ICD-10-CM

## 2021-05-12 ENCOUNTER — Encounter: Payer: Self-pay | Admitting: Surgery

## 2021-05-12 ENCOUNTER — Ambulatory Visit: Payer: Managed Care, Other (non HMO) | Admitting: Surgery

## 2021-05-12 ENCOUNTER — Other Ambulatory Visit: Payer: Self-pay

## 2021-05-12 VITALS — BP 143/85 | HR 99 | Temp 98.2°F | Ht 66.0 in | Wt 249.8 lb

## 2021-05-12 DIAGNOSIS — Z170 Estrogen receptor positive status [ER+]: Secondary | ICD-10-CM

## 2021-05-12 DIAGNOSIS — Z9889 Other specified postprocedural states: Secondary | ICD-10-CM

## 2021-05-12 DIAGNOSIS — C50911 Malignant neoplasm of unspecified site of right female breast: Secondary | ICD-10-CM | POA: Diagnosis not present

## 2021-05-12 NOTE — Patient Instructions (Signed)
We will call you in March 2024 to schedule your Mammogram and breast exam. Please call our office if you have any questions or concerns.  ?

## 2021-05-12 NOTE — Progress Notes (Signed)
Surgical Clinic Progress/Follow-up Note  ? ?HPI:  ?61 y.o. Female presents to clinic for right breast cancer follow-up, she continues taking her Femara.  She now presents with follow-up mammographic imaging. ?She denies any breast pain, skin changes, new or suspicious nodularity or masses.  She reports having no symptoms secondary to taking her Femara and is maintaining calcium and D3 supplementation ? ?Review of Systems:  ?Constitutional: denies fever/chills  ?ENT: denies sore throat, hearing problems  ?Respiratory: denies shortness of breath, wheezing  ?Cardiovascular: denies chest pain, palpitations  ?Gastrointestinal: denies abdominal pain, N/V, or diarrhea ?Skin: Denies any other rashes or skin discolorations ? ?Vital Signs:  ?BP (!) 143/85   Pulse 99   Temp 98.2 ?F (36.8 ?C) (Oral)   Ht '5\' 6"'$  (1.676 m)   Wt 249 lb 12.8 oz (113.3 kg)   SpO2 98%   BMI 40.32 kg/m?   ? ?Physical Exam:  ?Constitutional:  ?-- Obese body habitus  ?-- Awake, alert, and oriented x3  ?Pulmonary:  ?-- No crackles ?-- Equal breath sounds bilaterally ?-- Breathing non-labored at rest ?Cardiovascular:  ?-- S1, S2 present  ?-- No pericardial rubs  ?Gastrointestinal:  ?-- Soft and non-distended, non-tender ?GU  --Levada Dy present as chaperone, left breast remains pendulous, slightly asymmetric secondary to radiation changes reducing the previously pendulous right breast.  Skin changes are continuing to diminish on the right side.  There is no suspicious, nor dominant nodularity or masses present in either breast. ?Musculoskeletal / Integumentary:  ?-- Wounds or skin discoloration: None appreciated ?-- Extremities: B/L UE and LE FROM, hands and feet warm  ? ?Laboratory studies: Nothing pertinent. ? ?Imaging:  ? CLINICAL DATA:  61 year old female status post malignant right ?lumpectomy in April 2021. No current breast related symptoms. ?  ?EXAM: ?DIGITAL DIAGNOSTIC BILATERAL MAMMOGRAM WITH TOMOSYNTHESIS AND CAD ?  ?TECHNIQUE: ?Bilateral  digital diagnostic mammography and breast tomosynthesis ?was performed. The images were evaluated with computer-aided ?detection. ?  ?COMPARISON:  Previous exam(s). ?  ?ACR Breast Density Category b: There are scattered areas of ?fibroglandular density. ?  ?FINDINGS: ?Stable post lumpectomy changes in the lateral right breast at mid ?depth. Otherwise, no new or suspicious findings in either breast. ?The parenchymal pattern is stable. ?  ?IMPRESSION: ?1. No mammographic evidence of malignancy in either breast. ?2. Stable right breast posttreatment changes. ?  ?RECOMMENDATION: ?Per protocol, as the patient is now 2 or more years status post ?lumpectomy, she may return to annual screening mammography in 1 ?year. However, given the history of breast cancer, the patient ?remains eligible for annual diagnostic mammography if preferred. ?(Code:SM-B-01Y) ?  ?I have discussed the findings and recommendations with the patient. ?If applicable, a reminder letter will be sent to the patient ?regarding the next appointment. ?  ?BI-RADS CATEGORY  2: Benign. ?  ?Electronically Signed ?  By: Kristopher Oppenheim M.D. ?  On: 04/29/2021 10:01 ? ?Assessment:  ?60 y.o. yo Female with a problem list including...  ?Patient Active Problem List  ? Diagnosis Date Noted  ? History of breast cancer 11/06/2020  ? Status post breast lumpectomy 05/29/2019  ? Cancer of right breast, stage 1, estrogen receptor positive ([place]) 05/15/2019  ? COPD exacerbation ([place]) 08/24/2015  ? HTN (hypertension) 07/10/2015  ? Type 2 diabetes mellitus ([place]) 07/10/2015  ? Depression 07/10/2015  ? Mild intermittent asthma without complication 94/76/5465  ? Acute respiratory distress 09/26/2014  ? Asthmatic bronchitis with acute exacerbation 09/26/2014  ? Asthmatic bronchitis with exacerbation 09/26/2014  ?  ?presents  to clinic for follow-up evaluation of right breast cancer, 2 years postdiagnosis, progressing well. ? ?Plan:  ?            - return to clinic in 1 year with  follow-up screening or diagnostic imaging as needed, instructed to call office if any questions or concerns ? ?All of the above recommendations were discussed with the patient, and all of patient's questions were answered to her expressed satisfaction. ? ?Ronny Bacon, MD, FACS ?[place] ?General Surgery - Partnering for exceptional care. ?Office: [phone] ? ?

## 2021-05-13 ENCOUNTER — Inpatient Hospital Stay
Admission: EM | Admit: 2021-05-13 | Discharge: 2021-05-16 | DRG: 194 | Disposition: A | Discharge status: Home / Self Care | Payer: Managed Care, Other (non HMO) | Attending: Internal Medicine | Admitting: Internal Medicine

## 2021-05-13 ENCOUNTER — Other Ambulatory Visit: Payer: Self-pay

## 2021-05-13 ENCOUNTER — Emergency Department: Payer: Managed Care, Other (non HMO)

## 2021-05-13 DIAGNOSIS — J9601 Acute respiratory failure with hypoxia: Secondary | ICD-10-CM | POA: Diagnosis not present

## 2021-05-13 DIAGNOSIS — Z79811 Long term (current) use of aromatase inhibitors: Secondary | ICD-10-CM

## 2021-05-13 DIAGNOSIS — Z823 Family history of stroke: Secondary | ICD-10-CM

## 2021-05-13 DIAGNOSIS — Z20822 Contact with and (suspected) exposure to covid-19: Secondary | ICD-10-CM | POA: Diagnosis present

## 2021-05-13 DIAGNOSIS — Z87891 Personal history of nicotine dependence: Secondary | ICD-10-CM

## 2021-05-13 DIAGNOSIS — Z853 Personal history of malignant neoplasm of breast: Secondary | ICD-10-CM

## 2021-05-13 DIAGNOSIS — R0902 Hypoxemia: Secondary | ICD-10-CM | POA: Diagnosis present

## 2021-05-13 DIAGNOSIS — Z7984 Long term (current) use of oral hypoglycemic drugs: Secondary | ICD-10-CM

## 2021-05-13 DIAGNOSIS — J189 Pneumonia, unspecified organism: Secondary | ICD-10-CM

## 2021-05-13 DIAGNOSIS — Z79899 Other long term (current) drug therapy: Secondary | ICD-10-CM

## 2021-05-13 DIAGNOSIS — J45901 Unspecified asthma with (acute) exacerbation: Secondary | ICD-10-CM | POA: Diagnosis present

## 2021-05-13 DIAGNOSIS — R0603 Acute respiratory distress: Secondary | ICD-10-CM | POA: Diagnosis present

## 2021-05-13 DIAGNOSIS — J180 Bronchopneumonia, unspecified organism: Principal | ICD-10-CM | POA: Diagnosis present

## 2021-05-13 DIAGNOSIS — I10 Essential (primary) hypertension: Secondary | ICD-10-CM | POA: Diagnosis present

## 2021-05-13 DIAGNOSIS — C50911 Malignant neoplasm of unspecified site of right female breast: Secondary | ICD-10-CM

## 2021-05-13 DIAGNOSIS — E119 Type 2 diabetes mellitus without complications: Secondary | ICD-10-CM | POA: Diagnosis present

## 2021-05-13 DIAGNOSIS — Z923 Personal history of irradiation: Secondary | ICD-10-CM

## 2021-05-13 DIAGNOSIS — Z9889 Other specified postprocedural states: Secondary | ICD-10-CM

## 2021-05-13 DIAGNOSIS — F32A Depression, unspecified: Secondary | ICD-10-CM | POA: Diagnosis present

## 2021-05-13 DIAGNOSIS — Z833 Family history of diabetes mellitus: Secondary | ICD-10-CM

## 2021-05-13 HISTORY — DX: Pneumonia, unspecified organism: J18.9

## 2021-05-13 LAB — COMPREHENSIVE METABOLIC PANEL
ALT: 17 U/L (ref 0–44)
AST: 27 U/L (ref 15–41)
Albumin: 3.8 g/dL (ref 3.5–5.0)
Alkaline Phosphatase: 52 U/L (ref 38–126)
Anion gap: 9 (ref 5–15)
BUN: 13 mg/dL (ref 8–23)
CO2: 25 mmol/L (ref 22–32)
Calcium: 8.8 mg/dL — ABNORMAL LOW (ref 8.9–10.3)
Chloride: 102 mmol/L (ref 98–111)
Creatinine, Ser: 0.73 mg/dL (ref 0.44–1.00)
GFR, Estimated: >60 mL/min (ref >60)
Glucose, Bld: 160 mg/dL — ABNORMAL HIGH (ref 70–99)
Potassium: 3.5 mmol/L (ref 3.5–5.1)
Sodium: 136 mmol/L (ref 135–145)
Total Bilirubin: 1.2 mg/dL (ref 0.3–1.2)
Total Protein: 7.1 g/dL (ref 6.5–8.1)

## 2021-05-13 LAB — RESP PANEL BY RT-PCR (FLU A&B, COVID) ARPGX2
Influenza A by PCR: NEGATIVE
Influenza B by PCR: NEGATIVE
SARS Coronavirus 2 by RT PCR: NEGATIVE

## 2021-05-13 LAB — CBC WITH DIFFERENTIAL/PLATELET
Abs Immature Granulocytes: 0.03 10*3/uL (ref 0.00–0.07)
Basophils Absolute: 0 10*3/uL (ref 0.0–0.1)
Basophils Relative: 0 %
Eosinophils Absolute: 0.1 10*3/uL (ref 0.0–0.5)
Eosinophils Relative: 2 %
HCT: 37.8 % (ref 36.0–46.0)
Hemoglobin: 12.2 g/dL (ref 12.0–15.0)
Immature Granulocytes: 0 %
Lymphocytes Relative: 24 %
Lymphs Abs: 1.9 10*3/uL (ref 0.7–4.0)
MCH: 27.6 pg (ref 26.0–34.0)
MCHC: 32.3 g/dL (ref 30.0–36.0)
MCV: 85.5 fL (ref 80.0–100.0)
Monocytes Absolute: 0.7 10*3/uL (ref 0.1–1.0)
Monocytes Relative: 9 %
Neutro Abs: 5.1 10*3/uL (ref 1.7–7.7)
Neutrophils Relative %: 65 %
Platelets: 222 10*3/uL (ref 150–400)
RBC: 4.42 MIL/uL (ref 3.87–5.11)
RDW: 14 % (ref 11.5–15.5)
WBC: 7.9 10*3/uL (ref 4.0–10.5)
nRBC: 0 % (ref 0.0–0.2)

## 2021-05-13 MED ORDER — LACTATED RINGERS IV SOLN: INTRAVENOUS | Status: DC

## 2021-05-13 MED ORDER — MONTELUKAST SODIUM 10 MG PO TABS
10.0000 mg | ORAL_TABLET | Freq: Every morning | ORAL | Status: DC
  Administered 2021-05-14 – 2021-05-16 (×3): 10 mg via ORAL
  Filled 2021-05-13: qty 1

## 2021-05-13 MED ORDER — ENOXAPARIN SODIUM 60 MG/0.6ML IJ SOSY
0.5000 mg/kg | PREFILLED_SYRINGE | INTRAMUSCULAR | Status: DC
  Administered 2021-05-14 – 2021-05-16 (×3): 57.5 mg via SUBCUTANEOUS
  Filled 2021-05-13 (×4): qty 0.6

## 2021-05-13 MED ORDER — LACTATED RINGERS IV SOLN: INTRAVENOUS | Status: AC

## 2021-05-13 MED ORDER — ACETAMINOPHEN 325 MG PO TABS: 650.0000 mg | ORAL_TABLET | Freq: Four times a day (QID) | ORAL | Status: DC | PRN

## 2021-05-13 MED ORDER — ONDANSETRON HCL 4 MG/2ML IJ SOLN: 4.0000 mg | Freq: Four times a day (QID) | INTRAMUSCULAR | Status: DC | PRN

## 2021-05-13 MED ORDER — FLUTICASONE FUROATE-VILANTEROL 100-25 MCG/ACT IN AEPB
1.0000 | INHALATION_SPRAY | Freq: Every day | RESPIRATORY_TRACT | Status: DC
  Administered 2021-05-14 – 2021-05-15 (×2): 1 via RESPIRATORY_TRACT
  Filled 2021-05-13: qty 28

## 2021-05-13 MED ORDER — IPRATROPIUM-ALBUTEROL 0.5-2.5 (3) MG/3ML IN SOLN
3.0000 mL | Freq: Once | RESPIRATORY_TRACT | Status: AC
  Administered 2021-05-13: 3 mL via RESPIRATORY_TRACT
  Filled 2021-05-13: qty 3

## 2021-05-13 MED ORDER — ALBUTEROL SULFATE (2.5 MG/3ML) 0.083% IN NEBU: 3.0000 mL | INHALATION_SOLUTION | RESPIRATORY_TRACT | Status: DC | PRN

## 2021-05-13 MED ORDER — ACETAMINOPHEN 650 MG RE SUPP: 650.0000 mg | Freq: Four times a day (QID) | RECTAL | Status: DC | PRN

## 2021-05-13 MED ORDER — SODIUM CHLORIDE 0.9 % IV SOLN
500.0000 mg | Freq: Once | INTRAVENOUS | Status: AC
  Administered 2021-05-13: 500 mg via INTRAVENOUS
  Filled 2021-05-13: qty 5

## 2021-05-13 MED ORDER — ONDANSETRON HCL 4 MG PO TABS: 4.0000 mg | ORAL_TABLET | Freq: Four times a day (QID) | ORAL | Status: DC | PRN

## 2021-05-13 MED ORDER — IOHEXOL 350 MG/ML SOLN
100.0000 mL | Freq: Once | INTRAVENOUS | Status: AC | PRN
  Administered 2021-05-13: 100 mL via INTRAVENOUS

## 2021-05-13 MED ORDER — MOMETASONE FURO-FORMOTEROL FUM 200-5 MCG/ACT IN AERO: 2.0000 | INHALATION_SPRAY | Freq: Two times a day (BID) | RESPIRATORY_TRACT | Status: DC

## 2021-05-13 MED ORDER — UMECLIDINIUM BROMIDE 62.5 MCG/ACT IN AEPB
1.0000 | INHALATION_SPRAY | Freq: Every day | RESPIRATORY_TRACT | Status: DC
  Administered 2021-05-14 – 2021-05-15 (×2): 1 via RESPIRATORY_TRACT
  Filled 2021-05-13: qty 7

## 2021-05-13 MED ORDER — METHYLPREDNISOLONE SODIUM SUCC 125 MG IJ SOLR
125.0000 mg | Freq: Once | INTRAMUSCULAR | Status: AC
  Administered 2021-05-13: 125 mg via INTRAVENOUS
  Filled 2021-05-13: qty 2

## 2021-05-13 MED ORDER — SODIUM CHLORIDE 0.9 % IV SOLN
2.0000 g | Freq: Once | INTRAVENOUS | Status: AC
  Administered 2021-05-13: 2 g via INTRAVENOUS
  Filled 2021-05-13: qty 2

## 2021-05-13 NOTE — Progress Notes (Signed)
PHARMACIST - PHYSICIAN COMMUNICATION ? ?CONCERNING:  Enoxaparin (Lovenox) for DVT Prophylaxis  ? ? ?RECOMMENDATION: ?Patient was prescribed enoxaprin '40mg'$  q24 hours for VTE prophylaxis.  ? Danley Danker Weights  ? 05/13/21 1831  ?Weight: 112.9 kg (249 lb)  ? ? ?Body mass index is 40.19 kg/m?. ? ?Estimated Creatinine Clearance: 94.1 mL/min (by C-G formula based on SCr of 0.73 mg/dL). ? ? ?Based on [place] patient is candidate for enoxaparin 0.'5mg'$ /kg TBW SQ every 24 hours based on BMI being >30. ? ? ?DESCRIPTION: ?Pharmacy has adjusted enoxaparin dose per [facility] policy. ? ?Patient is now receiving enoxaparin 0.5 mg/kg every 24 hours  ? ?Renda Rolls, PharmD, MBA ?05/13/2021 ?10:15 PM ? ?

## 2021-05-13 NOTE — ED Triage Notes (Signed)
First Nurse Note:  C/O wheezing and SOB x 3 days.  STates has had similar symptoms 5 times since christmas. ?

## 2021-05-13 NOTE — H&P (Incomplete)
?History and Physical  ? ?Ansley Mangiapane VFI:[id number] DOB: 12-31-1960 DOA: 05/13/2021 ? ?PCP: Perrin Maltese, MD (Confirm with patient/family/NH records and if not entered, this has to be entered at [facility] point of entry) ?Outpatient Specialists: *** Theme park manager speciality and name if known) ?Patient coming from: *** ? ?I have personally briefly reviewed patient's old medical records in [place]. ? ?Chief Concern: *** ? ?HPI: No notes on file ? ? ? ?At bedside, she is able to tell me her name, age, and current year.  ? ?She reports shrotness of brreaht for 3 days. She denies fever, chest pain, nausea, vomitting. She reprots cough is nonrpdouctve and denies known sick contact.  ? ? ?Social history: she is a former tobacco user, and quitting 30 years ago. She smoked 1/2 ppd at her peak. She denies, recreational drug use. She works for [employer], bike assembly.  ? ?Vaccination history: She is vaccinated for covid and influena ? ?(The initial 2-3 lines should be focused and good to copy and paste in the HPI section of the daily progress note). ? ? ?(For level 3, the HPI must include 4+ descriptors: Location, Quality, Severity, Duration, Timing, Context, modifying factors, associated signs/symptoms and/or status of 3+ chronic problems.)  ? ?ROS:*** ?Constitutional: no weight change, no fever ?ENT/Mouth: no sore throat, no rhinorrhea ?Eyes: no eye pain, no vision changes ?Cardiovascular: no chest pain, no dyspnea,  no edema, no palpitations ?Respiratory: no cough, no sputum, no wheezing ?Gastrointestinal: no nausea, no vomiting, no diarrhea, no constipation ?Genitourinary: no urinary incontinence, no dysuria, no hematuria ?Musculoskeletal: no arthralgias, no myalgias ?Skin: no skin lesions, no pruritus, ?Neuro: + weakness, no loss of consciousness, no syncope ?Psych: no anxiety, no depression, + decrease appetite ?Heme/Lymph: no bruising, no bleeding ? ?ED Course: *** ? ?Assessment/Plan ? ?Principal Problem: ?   Multifocal pneumonia ?  ? ?Assessment and Plan: ?No notes have been filed under this hospital service. ?Service: Hospitalist ? ? ? ? ?*** ?Chart reviewed.  ? ?DVT prophylaxis: ***  ?Code Status: ***  ?Diet: *** ?Family Communication: ***  ?Disposition Plan: ***  ?Consults called: ***  ?Admission status: ***  ? ?Past Medical History:  ?Diagnosis Date  ? Arthritis   ? Asthma   ? Breast cancer ([place])   ? Diabetes mellitus without complication ([place])   ? Hypertension   ? Obesity   ? Personal history of radiation therapy   ? Pulmonary hypertension ([place])   ? Sleep apnea   ? ? ?Past Surgical History:  ?Procedure Laterality Date  ? BREAST LUMPECTOMY Right 05/23/2019  ? invasive DCIS radation   ? CATARACT EXTRACTION    ? CESAREAN SECTION    ? x 3   ? ? ?Social History:  reports that she has quit smoking. Her smoking use included cigarettes. She has never used smokeless tobacco. She reports that she does not drink alcohol and does not use drugs. ? ?No Known Allergies ?Family History  ?Problem Relation Age of Onset  ? Diabetes Mother   ? Stroke Mother   ? Diabetes Father   ? Diabetes Brother   ? Cancer Neg Hx   ? Heart failure Neg Hx   ? Hypertension Neg Hx   ? Breast cancer Neg Hx   ? ?Family history: Family history reviewed and not pertinent*** ? ?Prior to Admission medications   ?Medication Sig Start Date End Date Taking? Authorizing Provider  ?albuterol (VENTOLIN HFA) 108 (90 Base) MCG/ACT inhaler Inhale 2 puffs into the lungs  every 6 (six) hours as needed for wheezing or shortness of breath. 11/07/20  Yes Shawna Clamp, MD  ?budesonide-formoterol [facility]) 160-4.5 MCG/ACT inhaler Inhale 2 puffs into the lungs 2 (two) times daily.   Yes [provider]  ?calcium elemental as carbonate (CALCIUM ANTACID ULTRA) 400 MG chewable tablet Chew 1,000 mg by mouth 3 (three) times daily.   Yes [provider]  ?citalopram (CELEXA) 40 MG tablet Take 40 mg by mouth daily.  09/15/14  Yes [provider]  ?D3-50  1.25 MG (50000 UT) capsule Take by mouth once a week. 08/22/19  Yes [provider]  ?ipratropium-albuterol (DUONEB) 0.5-2.5 (3) MG/3ML SOLN Take by nebulization 3 (three) times daily as needed. 10/08/19  Yes [provider]  ?letrozole (FEMARA) 2.5 MG tablet TAKE 1 TABLET BY MOUTH EVERY DAY (DON'T START MED UNTIL YOU COMPLETE RADIATION 1ST) 04/25/21  Yes Sindy Guadeloupe, MD  ?losartan-hydrochlorothiazide (HYZAAR) 50-12.5 MG per tablet Take 1 tablet by mouth daily. 09/15/14  Yes [provider]  ?metFORMIN (GLUCOPHAGE) 1000 MG tablet Take 1,000 mg by mouth 2 (two) times daily. 09/09/14  Yes [provider]  ?montelukast (SINGULAIR) 10 MG tablet Take 10 mg by mouth every morning.  09/14/14  Yes [provider]  ?predniSONE (DELTASONE) 20 MG tablet Take 40 mg by mouth daily with breakfast.   Yes [provider]  ?TRELEGY ELLIPTA 100-62.5-25 MCG/ACT AEPB Take 1 puff by mouth daily. 03/27/21  Yes [provider]  ? ? ?Physical Exam: ?Vitals:  ? 05/13/21 1833 05/13/21 1836 05/13/21 2015 05/13/21 2100  ?BP:   119/70 117/67  ?Pulse:   86 84  ?Resp:   (!) 22 (!) 21  ?Temp:      ?TempSrc:      ?SpO2: (!) 89% 93% 96% 100%  ?Weight:      ?Height:      ? ?Constitutional: appears ***, NAD, calm, comfortable ?Eyes: PERRL, lids and conjunctivae normal ?ENMT: Mucous membranes are moist. Posterior pharynx clear of any exudate or lesions. Age-appropriate dentition. Hearing appropriate/loss*** ?Neck: normal, supple, no masses, no thyromegaly ?Respiratory: clear to auscultation bilaterally, no wheezing, no crackles. Normal respiratory effort. No accessory muscle use.  ?Cardiovascular: Regular rate and rhythm, no murmurs / rubs / gallops. No extremity edema. 2+ pedal pulses. No carotid bruits.  ?Abdomen: no tenderness, no masses palpated, no hepatosplenomegaly. Bowel sounds positive.  ?Musculoskeletal: no clubbing / cyanosis. No joint deformity upper and lower extremities. Good ROM, no  contractures, no atrophy. Normal muscle tone.  ?Skin: no rashes, lesions, ulcers. No induration ?Neurologic: Sensation intact. Strength 5/5 in all 4.  ?Psychiatric: Normal judgment and insight. Alert and oriented x 3. Normal mood.  ? ?EKG: independently reviewed, showing *** ? ?Chest x-ray on Admission: I personally reviewed and I agree*** with radiologist reading as below. ? ?DG Chest 2 View ? ?Result Date: 05/13/2021 ?CLINICAL DATA:  Shortness of breath EXAM: CHEST - 2 VIEW COMPARISON:  02/17/2021 FINDINGS: Frontal and lateral views of the chest demonstrate a stable cardiac silhouette. Patchy lingular airspace disease consistent with bronchopneumonia. No effusion or pneumothorax. No acute bony abnormality. IMPRESSION: 1. Patchy lingular consolidation consistent with bronchopneumonia. Electronically Signed   By: Randa Ngo M.D.   On: 05/13/2021 19:12  ? ?CT Angio Chest PE W and/or Wo Contrast ? ?Result Date: 05/13/2021 ?CLINICAL DATA:  Shortness of breath and wheezing for several days EXAM: CT ANGIOGRAPHY CHEST WITH CONTRAST TECHNIQUE: Multidetector CT imaging of the chest was performed using the standard protocol  during bolus administration of intravenous contrast. Multiplanar CT image reconstructions and MIPs were obtained to evaluate the vascular anatomy. RADIATION DOSE REDUCTION: This exam was performed according to the departmental dose-optimization program which includes automated exposure control, adjustment of the mA and/or kV according to patient size and/or use of iterative reconstruction technique. CONTRAST:  142m OMNIPAQUE IOHEXOL 350 MG/ML SOLN COMPARISON:  Chest x-ray from earlier in the same day. FINDINGS: Cardiovascular: Thoracic aorta shows no evidence of aneurysmal dilatation or dissection. No cardiac enlargement is noted. No significant coronary calcifications are noted. The pulmonary artery shows a normal branching pattern bilaterally. No definitive filling defect is noted. Mediastinum/Nodes:  Thoracic inlet is within normal limits. Scattered small mediastinal lymph nodes are noted but not significant by size criteria. These are likely reactive in nature. The esophagus as visualized is withi

## 2021-05-13 NOTE — ED Triage Notes (Signed)
See first nurse note, patient with complaints of shortness of breath and wheezing x3 days, history of asthma. States she has been using inhaler at home with no relief in symptoms. Productive cough, clear sputum. No known fevers.  ?

## 2021-05-13 NOTE — ED Provider Notes (Signed)
? ?[facility] ?Provider Note ? ? ? Event Date/Time  ? First MD Initiated Contact with Patient 05/13/21 2018   ?  (approximate) ? ? ?History  ? ?Shortness of Breath ? ? ?HPI ? ?Nancy Patton is a 61 y.o. female with a history of asthma COPD remote history of breast cancer not on chemotherapy currently not on any blood thinners presents to the ER for worsening shortness of breath exertional dyspnea over the past 3 days.  Is having cough with white productive sputum.  Denies any chest pain.  No leg swelling.  Does not smoke.  Has been taking inhalers at home with minimal relief. ?  ? ? ?Physical Exam  ? ?Triage Vital Signs: ?ED Triage Vitals [05/13/21 1831]  ?Enc Vitals Group  ?   BP 122/71  ?   Pulse Rate 95  ?   Resp (!) 22  ?   Temp 98.3 ?F (36.8 ?C)  ?   Temp Source Oral  ?   SpO2 91 %  ?   Weight 249 lb (112.9 kg)  ?   Height '5\' 6"'$  (1.676 m)  ?   Head Circumference   ?   Peak Flow   ?   Pain Score 0  ?   Pain Loc   ?   Pain Edu?   ?   Excl. in [place]?   ? ? ?Most recent vital signs: ?Vitals:  ? 05/13/21 2015 05/13/21 2100  ?BP: 119/70 117/67  ?Pulse: 86 84  ?Resp: (!) 22 (!) 21  ?Temp:    ?SpO2: 96% 100%  ? ? ? ?Constitutional: Alert  ?Eyes: Conjunctivae are normal.  ?Head: Atraumatic. ?Nose: No congestion/rhinnorhea. ?Mouth/Throat: Mucous membranes are moist.   ?Neck: Painless ROM.  ?Cardiovascular:   Good peripheral circulation. ?Respiratory: Normal respiratory effort.  Hypoxic on room air requiring supplemental oxygen.  Coarse wheeze and rhonchorous breath sounds in all lung fields. ?Gastrointestinal: Soft and nontender.  ?Musculoskeletal:  no deformity ?Neurologic:  MAE spontaneously. No gross focal neurologic deficits are appreciated.  ?Skin:  Skin is warm, dry and intact. No rash noted. ?Psychiatric: Mood and affect are normal. Speech and behavior are normal. ? ? ? ?ED Results / Procedures / Treatments  ? ?Labs ?(all labs ordered are listed, but only abnormal results are  displayed) ?Labs Reviewed  ?COMPREHENSIVE METABOLIC PANEL - Abnormal; Notable for the following components:  ?    Result Value  ? Glucose, Bld 160 (*)   ? Calcium 8.8 (*)   ? All other components within normal limits  ?RESP PANEL BY RT-PCR (FLU A&B, COVID) [id number]  ?CULTURE, BLOOD (ROUTINE X 2)  ?CULTURE, BLOOD (ROUTINE X 2)  ?CBC WITH DIFFERENTIAL/PLATELET  ? ? ? ?EKG ? ?ED ECG REPORT ?I, Merlyn Lot, the attending physician, personally viewed and interpreted this ECG. ? ? Date: 05/13/2021 ? EKG Time: 18:43 ? Rate: 90 ? Rhythm: sinus ? Axis: normal ? Intervals: normal intervals ? ST&T Change: no stemi, no depression ? ? ? ?RADIOLOGY ?Please see ED Course for my review and interpretation. ? ?I personally reviewed all radiographic images ordered to evaluate for the above acute complaints and reviewed radiology reports and findings.  These findings were personally discussed with the patient.  Please see medical record for radiology report. ? ? ? ?PROCEDURES: ? ?Critical Care performed: Yes, see critical care procedure note(s) ? ?.Critical Care ?Performed by: Merlyn Lot, MD ?Authorized by: Merlyn Lot, MD  ? ?Critical care provider statement:  ?  Critical care time (minutes):  35 ?  Critical care was necessary to treat or prevent imminent or life-threatening deterioration of the following conditions:  Respiratory failure ?  Critical care was time spent personally by me on the following activities:  Ordering and performing treatments and interventions, ordering and review of laboratory studies, ordering and review of radiographic studies, pulse oximetry, re-evaluation of patient's condition, review of old charts, obtaining history from patient or surrogate, examination of patient, evaluation of patient's response to treatment, discussions with primary provider, discussions with consultants and development of treatment plan with patient or surrogate ? ? ?MEDICATIONS ORDERED IN ED: ?Medications   ?albuterol (PROVENTIL) (2.5 MG/3ML) 0.083% nebulizer solution 3 mL (has no administration in time range)  ?lactated ringers infusion (has no administration in time range)  ?ceFEPIme (MAXIPIME) 2 g in sodium chloride 0.9 % 100 mL IVPB (2 g Intravenous New Bag/Given 05/13/21 2207)  ?azithromycin (ZITHROMAX) 500 mg in sodium chloride 0.9 % 250 mL IVPB (has no administration in time range)  ?ipratropium-albuterol (DUONEB) 0.5-2.5 (3) MG/3ML nebulizer solution 3 mL (3 mLs Nebulization Given 05/13/21 2053)  ?ipratropium-albuterol (DUONEB) 0.5-2.5 (3) MG/3ML nebulizer solution 3 mL (3 mLs Nebulization Given 05/13/21 2050)  ?methylPREDNISolone sodium succinate (SOLU-MEDROL) 125 mg/2 mL injection 125 mg (125 mg Intravenous Given 05/13/21 2049)  ?iohexol (OMNIPAQUE) 350 MG/ML injection 100 mL (100 mLs Intravenous Contrast Given 05/13/21 2125)  ? ? ? ?IMPRESSION / MDM / ASSESSMENT AND PLAN / ED COURSE  ?I reviewed the triage vital signs and the nursing notes. ?             ?               ? ?Differential diagnosis includes, but is not limited to, Asthma, copd, CHF, pna, ptx, malignancy, Pe, anemia ? ?Patient presenting with shortness of breath as described above with evidence of acute respiratory failure with hypoxia.  Chest x-ray my review and interpretation does not show any evidence of pneumothorax per radiology report concerning for bronchopneumonia.  Given her history will order CTA to evaluate for PE and malignancy.  We will give nebulizer and treat for COPD exacerbation. ? ?Clinical Course as of 05/13/21 2210  ?Fri May 13, 2021  ?2134 My review and interpretation of CTA do not appreciate evidence of saddle pulmonary embolism will await formal radiology report. [PR]  ?2146 Patient is CT imaging showing evidence of multilobar pneumonia.  Given her acute hypoxia will cover with antibiotics.  Do feel she will require hospitalization for additional nebulizers and further monitoring.  Case discussed in consultation with  hospitalist who agrees to admit to their service. [PR]  ?  ?Clinical Course User Index ?[PR] Merlyn Lot, MD  ? ? ? ?FINAL CLINICAL IMPRESSION(S) / ED DIAGNOSES  ? ?Final diagnoses:  ?Acute respiratory failure with hypoxia ([place])  ?Pneumonia of both lungs due to infectious organism, unspecified part of lung  ? ? ? ?Rx / DC Orders  ? ?ED Discharge Orders   ? ? None  ? ?  ? ? ? ?Note:  This document was prepared using Dragon voice recognition software and may include unintentional dictation errors. ? ?  ?Merlyn Lot, MD ?05/13/21 2210 ? ?

## 2021-05-14 ENCOUNTER — Encounter: Payer: Self-pay | Admitting: Internal Medicine

## 2021-05-14 DIAGNOSIS — Z7984 Long term (current) use of oral hypoglycemic drugs: Secondary | ICD-10-CM | POA: Diagnosis not present

## 2021-05-14 DIAGNOSIS — Z823 Family history of stroke: Secondary | ICD-10-CM | POA: Diagnosis not present

## 2021-05-14 DIAGNOSIS — J9601 Acute respiratory failure with hypoxia: Secondary | ICD-10-CM

## 2021-05-14 DIAGNOSIS — Z170 Estrogen receptor positive status [ER+]: Secondary | ICD-10-CM | POA: Diagnosis not present

## 2021-05-14 DIAGNOSIS — Z853 Personal history of malignant neoplasm of breast: Secondary | ICD-10-CM | POA: Diagnosis present

## 2021-05-14 DIAGNOSIS — C50911 Malignant neoplasm of unspecified site of right female breast: Secondary | ICD-10-CM | POA: Diagnosis not present

## 2021-05-14 DIAGNOSIS — R0603 Acute respiratory distress: Secondary | ICD-10-CM | POA: Diagnosis present

## 2021-05-14 DIAGNOSIS — Z79811 Long term (current) use of aromatase inhibitors: Secondary | ICD-10-CM | POA: Diagnosis not present

## 2021-05-14 DIAGNOSIS — Z833 Family history of diabetes mellitus: Secondary | ICD-10-CM | POA: Diagnosis not present

## 2021-05-14 DIAGNOSIS — J180 Bronchopneumonia, unspecified organism: Secondary | ICD-10-CM | POA: Diagnosis present

## 2021-05-14 DIAGNOSIS — I10 Essential (primary) hypertension: Secondary | ICD-10-CM | POA: Diagnosis present

## 2021-05-14 DIAGNOSIS — Z87891 Personal history of nicotine dependence: Secondary | ICD-10-CM | POA: Diagnosis not present

## 2021-05-14 DIAGNOSIS — E119 Type 2 diabetes mellitus without complications: Secondary | ICD-10-CM | POA: Diagnosis present

## 2021-05-14 DIAGNOSIS — R0902 Hypoxemia: Secondary | ICD-10-CM | POA: Diagnosis present

## 2021-05-14 DIAGNOSIS — Z08 Encounter for follow-up examination after completed treatment for malignant neoplasm: Secondary | ICD-10-CM | POA: Diagnosis not present

## 2021-05-14 DIAGNOSIS — Z79899 Other long term (current) drug therapy: Secondary | ICD-10-CM | POA: Diagnosis not present

## 2021-05-14 DIAGNOSIS — Z20822 Contact with and (suspected) exposure to covid-19: Secondary | ICD-10-CM | POA: Diagnosis present

## 2021-05-14 DIAGNOSIS — J45901 Unspecified asthma with (acute) exacerbation: Secondary | ICD-10-CM | POA: Diagnosis present

## 2021-05-14 DIAGNOSIS — J189 Pneumonia, unspecified organism: Secondary | ICD-10-CM | POA: Diagnosis not present

## 2021-05-14 DIAGNOSIS — Z923 Personal history of irradiation: Secondary | ICD-10-CM | POA: Diagnosis not present

## 2021-05-14 DIAGNOSIS — F32A Depression, unspecified: Secondary | ICD-10-CM | POA: Diagnosis present

## 2021-05-14 LAB — CBC
HCT: 37.7 % (ref 36.0–46.0)
Hemoglobin: 12 g/dL (ref 12.0–15.0)
MCH: 27.5 pg (ref 26.0–34.0)
MCHC: 31.8 g/dL (ref 30.0–36.0)
MCV: 86.3 fL (ref 80.0–100.0)
Platelets: 189 10*3/uL (ref 150–400)
RBC: 4.37 MIL/uL (ref 3.87–5.11)
RDW: 13.6 % (ref 11.5–15.5)
WBC: 6 10*3/uL (ref 4.0–10.5)
nRBC: 0 % (ref 0.0–0.2)

## 2021-05-14 LAB — HEMOGLOBIN A1C
Hgb A1c MFr Bld: 6.5 % — ABNORMAL HIGH (ref 4.8–5.6)
Mean Plasma Glucose: 139.85 mg/dL

## 2021-05-14 LAB — BASIC METABOLIC PANEL
Anion gap: 8 (ref 5–15)
BUN: 13 mg/dL (ref 8–23)
CO2: 26 mmol/L (ref 22–32)
Calcium: 8.9 mg/dL (ref 8.9–10.3)
Chloride: 103 mmol/L (ref 98–111)
Creatinine, Ser: 0.75 mg/dL (ref 0.44–1.00)
GFR, Estimated: >60 mL/min (ref >60)
Glucose, Bld: 240 mg/dL — ABNORMAL HIGH (ref 70–99)
Potassium: 4.3 mmol/L (ref 3.5–5.1)
Sodium: 137 mmol/L (ref 135–145)

## 2021-05-14 LAB — GLUCOSE, CAPILLARY
Glucose-Capillary: 212 mg/dL — ABNORMAL HIGH (ref 70–99)
Glucose-Capillary: 150 mg/dL — ABNORMAL HIGH (ref 70–99)
Glucose-Capillary: 120 mg/dL — ABNORMAL HIGH (ref 70–99)
Glucose-Capillary: 174 mg/dL — ABNORMAL HIGH (ref 70–99)

## 2021-05-14 MED ORDER — INSULIN ASPART 100 UNIT/ML IJ SOLN
0.0000 [IU] | Freq: Every day | INTRAMUSCULAR | Status: DC
  Administered 2021-05-15: 3 [IU] via SUBCUTANEOUS
  Filled 2021-05-14: qty 1

## 2021-05-14 MED ORDER — METHYLPREDNISOLONE SODIUM SUCC 40 MG IJ SOLR
40.0000 mg | Freq: Every day | INTRAMUSCULAR | Status: AC
  Administered 2021-05-14: 40 mg via INTRAVENOUS
  Filled 2021-05-14: qty 1

## 2021-05-14 MED ORDER — SODIUM CHLORIDE 0.9 % IV SOLN
2.0000 g | Freq: Three times a day (TID) | INTRAVENOUS | Status: DC
  Administered 2021-05-14 – 2021-05-16 (×7): 2 g via INTRAVENOUS
  Filled 2021-05-14 (×8): qty 2

## 2021-05-14 MED ORDER — HYDROCHLOROTHIAZIDE 12.5 MG PO TABS
12.5000 mg | ORAL_TABLET | Freq: Every day | ORAL | Status: DC
  Administered 2021-05-14: 12.5 mg via ORAL
  Filled 2021-05-14: qty 1

## 2021-05-14 MED ORDER — SODIUM CHLORIDE 0.9 % IV SOLN
500.0000 mg | INTRAVENOUS | Status: DC
  Administered 2021-05-14 – 2021-05-15 (×2): 500 mg via INTRAVENOUS
  Filled 2021-05-14 (×2): qty 5

## 2021-05-14 MED ORDER — ALBUTEROL SULFATE (2.5 MG/3ML) 0.083% IN NEBU
2.5000 mg | INHALATION_SOLUTION | RESPIRATORY_TRACT | Status: DC | PRN
  Administered 2021-05-15: 2.5 mg via RESPIRATORY_TRACT
  Filled 2021-05-14: qty 3

## 2021-05-14 MED ORDER — BUDESONIDE 0.25 MG/2ML IN SUSP: 0.2500 mg | Freq: Four times a day (QID) | RESPIRATORY_TRACT | Status: DC | PRN

## 2021-05-14 MED ORDER — METFORMIN HCL 500 MG PO TABS
1000.0000 mg | ORAL_TABLET | Freq: Two times a day (BID) | ORAL | Status: DC
  Administered 2021-05-16: 1000 mg via ORAL
  Filled 2021-05-14: qty 2

## 2021-05-14 MED ORDER — ALBUTEROL SULFATE (2.5 MG/3ML) 0.083% IN NEBU: 2.5000 mg | INHALATION_SOLUTION | RESPIRATORY_TRACT | Status: DC | PRN

## 2021-05-14 MED ORDER — ALBUTEROL SULFATE (2.5 MG/3ML) 0.083% IN NEBU
2.5000 mg | INHALATION_SOLUTION | Freq: Four times a day (QID) | RESPIRATORY_TRACT | Status: DC | PRN
  Administered 2021-05-14: 2.5 mg via RESPIRATORY_TRACT
  Filled 2021-05-14: qty 3

## 2021-05-14 MED ORDER — CITALOPRAM HYDROBROMIDE 20 MG PO TABS
40.0000 mg | ORAL_TABLET | Freq: Every day | ORAL | Status: DC
  Administered 2021-05-14 – 2021-05-16 (×3): 40 mg via ORAL
  Filled 2021-05-14 (×3): qty 2

## 2021-05-14 MED ORDER — INSULIN ASPART 100 UNIT/ML IJ SOLN
0.0000 [IU] | Freq: Three times a day (TID) | INTRAMUSCULAR | Status: DC
  Administered 2021-05-14: 7 [IU] via SUBCUTANEOUS
  Administered 2021-05-14 – 2021-05-15 (×2): 3 [IU] via SUBCUTANEOUS
  Administered 2021-05-15: 4 [IU] via SUBCUTANEOUS
  Filled 2021-05-14 (×4): qty 1

## 2021-05-14 MED ORDER — LETROZOLE 2.5 MG PO TABS
2.5000 mg | ORAL_TABLET | Freq: Every day | ORAL | Status: DC
  Administered 2021-05-14 – 2021-05-16 (×3): 2.5 mg via ORAL
  Filled 2021-05-14 (×3): qty 1

## 2021-05-14 MED ORDER — LOSARTAN POTASSIUM-HCTZ 50-12.5 MG PO TABS: 1.0000 | ORAL_TABLET | Freq: Every day | ORAL | Status: DC

## 2021-05-14 MED ORDER — SODIUM CHLORIDE 0.9 % IV SOLN
2.0000 g | INTRAVENOUS | Status: DC
  Filled 2021-05-14: qty 20

## 2021-05-14 MED ORDER — LOSARTAN POTASSIUM 50 MG PO TABS
50.0000 mg | ORAL_TABLET | Freq: Every day | ORAL | Status: DC
  Administered 2021-05-14: 50 mg via ORAL
  Filled 2021-05-14: qty 1

## 2021-05-14 NOTE — Hospital Course (Addendum)
[name] is a 61 year old female with history of hypertension, non-insulin-dependent diabetes mellitus, depression, history of breast cancer status postlumpectomy, currently on Femara asthma, presents emergency department for chief concerns of shortness of breath. ? ?Vitals in the emergency department showed temperature of 98.3, respiration rate 22, heart rate 95, blood pressure 122/71, SPO2 of 93% on 3 L nasal cannula. ? ?Serum sodium 136, potassium 3.5, chloride 102, bicarb 25, BUN 13, creatinine 0.73, nonfasting blood glucose 160, GFR greater than 60, WBC 7.9, hemoglobin 12.2, platelets of 222. ? ?CTA of the chest: Was read as no evidence of PE.  Patchy multifocal infiltrates in the right middle and left lower lobe consistent with multifocal pneumonia. ? ?ED treatment: DuoNeb's x2, Solu-Medrol 125 mg IV one-time dose, azithromycin 500 mg one-time dose, cefepime 2 g IV one-time dose. ?

## 2021-05-14 NOTE — H&P (Addendum)
History and Physical   Nancy Patton UXL:[id number] DOB: February 13, 1961 DOA: 05/13/2021  PCP: Margaretann Loveless, MD  Patient coming from: Home  I have personally briefly reviewed patient's old medical records in [facility] EMR.  Chief Concern: Shortness of breath  HPI: [name] is a 61 year old female with history of hypertension, non-insulin-dependent diabetes mellitus, depression, history of breast cancer status postlumpectomy, currently on Femara asthma, presents emergency department for chief concerns of shortness of breath.  Vitals in the emergency department showed temperature of 98.3, respiration rate 22, heart rate 95, blood pressure 122/71, SPO2 of 93% on 3 L nasal cannula.  Serum sodium 136, potassium 3.5, chloride 102, bicarb 25, BUN 13, creatinine 0.73, nonfasting blood glucose 160, GFR greater than 60, WBC 7.9, hemoglobin 12.2, platelets of 222.  CTA of the chest: Was read as no evidence of PE.  Patchy multifocal infiltrates in the right middle and left lower lobe consistent with multifocal pneumonia.  ED treatment: DuoNeb's x2, Solu-Medrol 125 mg IV one-time dose, azithromycin 500 mg one-time dose, cefepime 2 g IV one-time dose.  At bedside, she is able to tell me her name, age, and current year.   She reports shortness of breath for 3 days. She denies fever, chest pain, nausea, vomitting. She reports cough that is nonrpdouctve and denies known sick contact.   Social history: she is a former tobacco user, and quitting 30 years ago. She smoked 1/2 ppd at her peak. She denies, recreational drug use. She works for [employer],  assembly.   Vaccination history: She is vaccinated for covid and influenza  ROS: Constitutional: no weight change, no fever ENT/Mouth: no sore throat, no rhinorrhea Eyes: no eye pain, no vision changes Cardiovascular: no chest pain, + dyspnea,  no edema, no palpitations Respiratory: + cough, no sputum, no wheezing Gastrointestinal: no  nausea, no vomiting, no diarrhea, no constipation Genitourinary: no urinary incontinence, no dysuria, no hematuria Musculoskeletal: no arthralgias, no myalgias Skin: no skin lesions, no pruritus, Neuro: + weakness, no loss of consciousness, no syncope Psych: no anxiety, no depression, + decrease appetite Heme/Lymph: no bruising, no bleeding  ED Course: Discussed with emergency medicine provider, patient requiring hospitalization for chief concerns of COPD exacerbation.  Assessment/Plan  Principal Problem:   Multifocal pneumonia Active Problems:   Acute respiratory distress   Asthmatic bronchitis with acute exacerbation   HTN (hypertension)   Type 2 diabetes mellitus (HCC)   Depression   Cancer of right breast, stage 1, estrogen receptor positive (HCC)   Status post breast lumpectomy   History of breast cancer   Assessment and Plan:  * Multifocal pneumonia - Continue azithromycin and cefepime per pharmacy - Status post Solu-Medrol 125 mg IV one-time dose per EDP - Ordered Solu-Medrol 40 mg IV one-time dose for 05/14/2021  Cancer of right breast, stage 1, estrogen receptor positive (HCC) - Resumed home letrozole 2.5 mg daily  Depression - Citalopram 40 mg daily resumed  Type 2 diabetes mellitus (HCC) - Non-insulin-dependent diabetes mellitus - Metformin 1000 mg p.o. twice daily resumed - Insulin SSI with agents coverage ordered  HTN (hypertension) - Resume losartan-hydrochlorothiazide 50-12.5 mg p.o. daily  Asthmatic bronchitis with acute exacerbation - As needed albuterol and Pulmicort nebulizer ordered - Resumed home inhalers - Montelukast 10 mg daily resumed  Chart reviewed.   DVT prophylaxis: Enoxaparin Code Status: Full code Diet: Heart healthy/carb modified Family Communication: No, she states her family ready knows she is here Disposition Plan: Pending clinical course Consults called:  None at this time Admission status: Telemetry cardiac,  observation  Past Medical History:  Diagnosis Date   Arthritis    Asthma    Breast cancer (HCC)    Diabetes mellitus without complication (HCC)    Hypertension    Obesity    Personal history of radiation therapy    Pulmonary hypertension (HCC)    Sleep apnea    Past Surgical History:  Procedure Laterality Date   BREAST LUMPECTOMY Right 05/23/2019   invasive DCIS radation    CATARACT EXTRACTION     CESAREAN SECTION     x 3    Social History:  reports that she has quit smoking. Her smoking use included cigarettes. She has never used smokeless tobacco. She reports that she does not drink alcohol and does not use drugs.  No Known Allergies Family History  Problem Relation Age of Onset   Diabetes Mother    Stroke Mother    Diabetes Father    Diabetes Brother    Cancer Neg Hx    Heart failure Neg Hx    Hypertension Neg Hx    Breast cancer Neg Hx    Family history: Family history reviewed and not pertinent  Prior to Admission medications   Medication Sig Start Date End Date Taking? Authorizing Provider  albuterol (VENTOLIN HFA) 108 (90 Base) MCG/ACT inhaler Inhale 2 puffs into the lungs every 6 (six) hours as needed for wheezing or shortness of breath. 11/07/20  Yes Cipriano Bunker, MD  budesonide-formoterol [facility]) 160-4.5 MCG/ACT inhaler Inhale 2 puffs into the lungs 2 (two) times daily.   Yes [provider]  calcium elemental as carbonate (CALCIUM ANTACID ULTRA) 400 MG chewable tablet Chew 1,000 mg by mouth 3 (three) times daily.   Yes [provider]  citalopram (CELEXA) 40 MG tablet Take 40 mg by mouth daily.  09/15/14  Yes [provider]  D3-50 1.25 MG (50000 UT) capsule Take by mouth once a week. 08/22/19  Yes [provider]  ipratropium-albuterol (DUONEB) 0.5-2.5 (3) MG/3ML SOLN Take by nebulization 3 (three) times daily as needed. 10/08/19  Yes [provider]  letrozole (FEMARA) 2.5 MG tablet TAKE 1 TABLET BY MOUTH EVERY  DAY (DON'T START MED UNTIL YOU COMPLETE RADIATION 1ST) 04/25/21  Yes Creig Hines, MD  losartan-hydrochlorothiazide (HYZAAR) 50-12.5 MG per tablet Take 1 tablet by mouth daily. 09/15/14  Yes [provider]  metFORMIN (GLUCOPHAGE) 1000 MG tablet Take 1,000 mg by mouth 2 (two) times daily. 09/09/14  Yes [provider]  montelukast (SINGULAIR) 10 MG tablet Take 10 mg by mouth every morning.  09/14/14  Yes [provider]  predniSONE (DELTASONE) 20 MG tablet Take 40 mg by mouth daily with breakfast.   Yes [provider]  TRELEGY ELLIPTA 100-62.5-25 MCG/ACT AEPB Take 1 puff by mouth daily. 03/27/21  Yes [provider]    Physical Exam: Vitals:   05/13/21 2015 05/13/21 2100 05/13/21 2337 05/14/21 0021  BP: 119/70 117/67 131/72   Pulse: 86 84 90   Resp: (!) 22 (!) 21 (!) 22   Temp:   99.2 F (37.3 C)   TempSrc:      SpO2: 96% 100% 93%   Weight:    111 kg  Height:    5\' 6"  (1.676 m)   Constitutional: appears age-appropriate, NAD, calm, comfortable Eyes: PERRL, lids and conjunctivae normal ENMT: Mucous membranes are moist. Posterior pharynx clear of any exudate or lesions. Age-appropriate dentition. Hearing appropriate Neck: normal, supple,  no masses, no thyromegaly Respiratory: clear to auscultation bilaterally, no wheezing, no crackles. Normal respiratory effort. No accessory muscle use.  Cardiovascular: Regular rate and rhythm, no murmurs / rubs / gallops. No extremity edema. 2+ pedal pulses. No carotid bruits.  Abdomen: Obese abdomen, no tenderness, no masses palpated, no hepatosplenomegaly. Bowel sounds positive.  Musculoskeletal: no clubbing / cyanosis. No joint deformity upper and lower extremities. Good ROM, no contractures, no atrophy. Normal muscle tone.  Skin: no rashes, lesions, ulcers. No induration Neurologic: Sensation intact. Strength 5/5 in all 4.  Psychiatric: Normal judgment and insight. Alert and oriented x 3. Normal mood.   EKG:  independently reviewed, showing sinus rhythm with rate of 91, QTc 484  Chest x-ray on Admission: I personally reviewed and I agree with radiologist reading as below.  DG Chest 2 View  Result Date: 05/13/2021 CLINICAL DATA:  Shortness of breath EXAM: CHEST - 2 VIEW COMPARISON:  02/17/2021 FINDINGS: Frontal and lateral views of the chest demonstrate a stable cardiac silhouette. Patchy lingular airspace disease consistent with bronchopneumonia. No effusion or pneumothorax. No acute bony abnormality. IMPRESSION: 1. Patchy lingular consolidation consistent with bronchopneumonia. Electronically Signed   By: Sharlet Salina M.D.   On: 05/13/2021 19:12   CT Angio Chest PE W and/or Wo Contrast  Result Date: 05/13/2021 CLINICAL DATA:  Shortness of breath and wheezing for several days EXAM: CT ANGIOGRAPHY CHEST WITH CONTRAST TECHNIQUE: Multidetector CT imaging of the chest was performed using the standard protocol during bolus administration of intravenous contrast. Multiplanar CT image reconstructions and MIPs were obtained to evaluate the vascular anatomy. RADIATION DOSE REDUCTION: This exam was performed according to the departmental dose-optimization program which includes automated exposure control, adjustment of the mA and/or kV according to patient size and/or use of iterative reconstruction technique. CONTRAST:  OMNIPAQUE IOHEXOL 350 MG/ML SOLN COMPARISON:  Chest x-ray from earlier in the same day. FINDINGS: Cardiovascular: Thoracic aorta shows no evidence of aneurysmal dilatation or dissection. No cardiac enlargement is noted. No significant coronary calcifications are noted. The pulmonary artery shows a normal branching pattern bilaterally. No definitive filling defect is noted. Mediastinum/Nodes: Thoracic inlet is within normal limits. Scattered small mediastinal lymph nodes are noted but not significant by size criteria. These are likely reactive in nature. The esophagus as visualized is within  normal limits. Lungs/Pleura: Lungs are well aerated bilaterally. Patchy airspace opacity is noted within left lower lobe and right middle lobe consistent with multifocal pneumonia. Some chronic consolidation in the inferior lingula is noted as well. No sizable effusion is noted. No focal parenchymal nodule is seen. Upper Abdomen: Visualized upper abdomen shows fatty infiltration of the liver. Musculoskeletal: Mild degenerative changes of the thoracic spine are noted. No acute rib abnormality is seen. Review of the MIP images confirms the above findings. IMPRESSION: No evidence of pulmonary emboli. Patchy multifocal infiltrates in the right middle and left lower lobes consistent with multifocal pneumonia. No other focal abnormality is noted. Electronically Signed   By: Alcide Clever M.D.   On: 05/13/2021 21:37    Labs on Admission: I have personally reviewed following labs  CBC: Recent Labs  Lab 05/13/21 1836  WBC 7.9  NEUTROABS 5.1  HGB 12.2  HCT 37.8  MCV 85.5  PLT 222   Basic Metabolic Panel: Recent Labs  Lab 05/13/21 1836  NA 136  K 3.5  CL 102  CO2 25  GLUCOSE 160*  BUN 13  CREATININE 0.73  CALCIUM 8.8*   GFR: Estimated Creatinine Clearance: 93.3  mL/min (by C-G formula based on SCr of 0.73 mg/dL).  Liver Function Tests: Recent Labs  Lab 05/13/21 1836  AST 27  ALT 17  ALKPHOS 52  BILITOT 1.2  PROT 7.1  ALBUMIN 3.8   Urine analysis:    Component Value Date/Time   COLORURINE YELLOW (A) 12/27/2015 1435   APPEARANCEUR CLEAR (A) 12/27/2015 1435   LABSPEC 1.016 12/27/2015 1435   PHURINE 5.0 12/27/2015 1435   GLUCOSEU NEGATIVE 12/27/2015 1435   HGBUR NEGATIVE 12/27/2015 1435   BILIRUBINUR NEGATIVE 12/27/2015 1435   KETONESUR NEGATIVE 12/27/2015 1435   PROTEINUR NEGATIVE 12/27/2015 1435   NITRITE NEGATIVE 12/27/2015 1435   LEUKOCYTESUR 1+ (A) 12/27/2015 1435   Dr. Sedalia Muta [facility]  If 7PM-7AM, please contact overnight-coverage provider If 7AM-7PM, please  contact day coverage provider [email or web address]  05/14/2021, 1:30 AM

## 2021-05-14 NOTE — Assessment & Plan Note (Addendum)
-   As needed albuterol and Pulmicort nebulizer ordered ?- Resumed home inhalers ?- Montelukast 10 mg daily resumed ?- active coughing and wheezing improved and patient was weaned to room air, able to ambulate without difficulty ?

## 2021-05-14 NOTE — Plan of Care (Signed)
  Problem: Clinical Measurements: Goal: Ability to maintain clinical measurements within normal limits will improve Outcome: Progressing   Problem: Clinical Measurements: Goal: Will remain free from infection Outcome: Progressing   

## 2021-05-14 NOTE — Progress Notes (Signed)
Pharmacy Antibiotic Note ? ?Nancy Patton is a 61 y.o. female admitted on 05/13/2021 with multifocal pneumonia.  Pharmacy has been consulted for Cefepime dosing x 5 days. ? ?Plan: ?Cefepime 2 gm q8h per indication and renal fxn. ? ?Pharmacy will continue to follow and will adjust dosing whenever warranted. ? ?Height: '5\' 6"'$  (167.6 cm) ?Weight: 111 kg (244 lb 11.4 oz) ?IBW/kg (Calculated) : 59.3 ? ?Temp (24hrs), Avg:98.8 ?F (37.1 ?C), Min:98.3 ?F (36.8 ?C), Max:99.2 ?F (37.3 ?C) ? ?Recent Labs  ?Lab 05/13/21 ?1836  ?WBC 7.9  ?CREATININE 0.73  ?  ?Estimated Creatinine Clearance: 93.3 mL/min (by C-G formula based on SCr of 0.73 mg/dL).   ? ?No Known Allergies ? ?Antimicrobials this admission: ?3/24 Cefepime >> x 5 days ?3/24 Azithromycin >> x 5 days ? ?Microbiology results: ?3/24 BCx: Pending ? ?Thank you for allowing pharmacy to be a part of this patient?s care. ? ?Renda Rolls, PharmD, MBA ?05/14/2021 ?1:34 AM ? ? ?

## 2021-05-14 NOTE — Assessment & Plan Note (Addendum)
-   Non-insulin-dependent diabetes mellitus ?- Metformin 1000 mg p.o. twice daily resumed ?- Insulin SSI with agents coverage ordered while in hospital ?-Resume home meds on d/c ?

## 2021-05-14 NOTE — Assessment & Plan Note (Signed)
-   Resumed home letrozole 2.5 mg daily 

## 2021-05-14 NOTE — Progress Notes (Signed)
?  Progress Note ? ? ?Patient: Nancy Patton DOB: [birth year] DOA: 05/13/2021     0 ?DOS: the patient was seen and examined on 05/14/2021 ?  ?Brief hospital course: ?[name] is a 61 year old female with history of hypertension, non-insulin-dependent diabetes mellitus, depression, history of breast cancer status postlumpectomy, currently on Femara asthma, presents emergency department for chief concerns of shortness of breath. ? ?Vitals in the emergency department showed temperature of 98.3, respiration rate 22, heart rate 95, blood pressure 122/71, SPO2 of 93% on 3 L nasal cannula. ? ?Serum sodium 136, potassium 3.5, chloride 102, bicarb 25, BUN 13, creatinine 0.73, nonfasting blood glucose 160, GFR greater than 60, WBC 7.9, hemoglobin 12.2, platelets of 222. ? ?CTA of the chest: Was read as no evidence of PE.  Patchy multifocal infiltrates in the right middle and left lower lobe consistent with multifocal pneumonia. ? ?ED treatment: DuoNeb's x2, Solu-Medrol 125 mg IV one-time dose, azithromycin 500 mg one-time dose, cefepime 2 g IV one-time dose. ? ?Assessment and Plan: ?* Multifocal pneumonia ?- Continue azithromycin and cefepime per pharmacy ?- continued scheduled IV steroids ? ?Cancer of right breast, stage 1, estrogen receptor positive ([place]) ?- Resumed home letrozole 2.5 mg daily ? ?Depression ?- Continued citalopram 40 mg daily resumed ? ?Type 2 diabetes mellitus ([place]) ?- Non-insulin-dependent diabetes mellitus ?- Metformin 1000 mg p.o. twice daily resumed ?- Insulin SSI with agents coverage ordered ? ?HTN (hypertension) ?- Continue losartan-hydrochlorothiazide 50-12.5 mg p.o. daily ? ?Asthmatic bronchitis with acute exacerbation ?- As needed albuterol and Pulmicort nebulizer ordered ?- Resumed home inhalers ?- Montelukast 10 mg daily resumed ?- active coughing and wheezing and still dependent on 3LNC, baseline O2 naive ? ? ? ? ?  ? ?Subjective: Continues feeling sob and  wheezing ? ?Physical Exam: ?Vitals:  ? 05/14/21 0311 05/14/21 0641 05/14/21 0746 05/14/21 1143  ?BP: 109/67  127/73 109/82  ?Pulse: 86  90 73  ?Resp: 20  19   ?Temp: 97.7 ?F (36.5 ?C)  97.8 ?F (36.6 ?C)   ?TempSrc:   Oral   ?SpO2: 95% 95% 100% (!) 81%  ?Weight:      ?Height:      ? ?General exam: Awake, laying in bed, in nad ?Respiratory system: coughing, increased resp effort, inspiratory and expiratory wheezing ?Cardiovascular system: regular rate, s1, s2 ?Gastrointestinal system: Soft, nondistended, positive BS ?Central nervous system: CN2-12 grossly intact, strength intact ?Extremities: Perfused, no clubbing ?Skin: Normal skin turgor, no notable skin lesions seen ?Psychiatry: Mood normal // no visual hallucinations  ? ?Data Reviewed: ? ?Chest CT with patchy multifocal infiltrates in R middle and L lower lobes ? ?Family Communication: Pt in room, family not at bedside ? ?Disposition: ?Status is: Observation ?The patient will require care spanning > 2 midnights and should be moved to inpatient because: Severity of illness, needing O2 and IV steroids ? Planned Discharge Destination: Home ? ? ? ?Author: ?Marylu Lund, MD ?05/14/2021 3:27 PM ? ?For on call review [email or web address]  ?

## 2021-05-14 NOTE — Assessment & Plan Note (Addendum)
-   Initially continued on llosartan-hydrochlorothiazide 50-12.5 mg p.o. daily ?-SBP soft in the upper 90's, thus bp meds now held while in hospital ?-Resume home meds on d/c ?

## 2021-05-14 NOTE — Assessment & Plan Note (Addendum)
-   Continued citalopram 40 mg daily resumed ?

## 2021-05-14 NOTE — Assessment & Plan Note (Addendum)
-   Continued azithromycin and cefepime per pharmacy ?- continued scheduled IV steroids, to continue prednisone taper on d/c ?-Weaned to room air ?-Patient to complete azithromycin and omnicef on d/c ?

## 2021-05-15 DIAGNOSIS — J9601 Acute respiratory failure with hypoxia: Secondary | ICD-10-CM | POA: Diagnosis not present

## 2021-05-15 DIAGNOSIS — J189 Pneumonia, unspecified organism: Secondary | ICD-10-CM | POA: Diagnosis not present

## 2021-05-15 LAB — GLUCOSE, CAPILLARY
Glucose-Capillary: 126 mg/dL — ABNORMAL HIGH (ref 70–99)
Glucose-Capillary: 116 mg/dL — ABNORMAL HIGH (ref 70–99)
Glucose-Capillary: 170 mg/dL — ABNORMAL HIGH (ref 70–99)
Glucose-Capillary: 254 mg/dL — ABNORMAL HIGH (ref 70–99)

## 2021-05-15 LAB — STREP PNEUMONIAE URINARY ANTIGEN: Strep Pneumo Urinary Antigen: NEGATIVE

## 2021-05-15 MED ORDER — IPRATROPIUM-ALBUTEROL 0.5-2.5 (3) MG/3ML IN SOLN
3.0000 mL | RESPIRATORY_TRACT | Status: DC
  Administered 2021-05-15 – 2021-05-16 (×5): 3 mL via RESPIRATORY_TRACT
  Filled 2021-05-15 (×6): qty 3

## 2021-05-15 MED ORDER — METHYLPREDNISOLONE SODIUM SUCC 125 MG IJ SOLR
80.0000 mg | INTRAMUSCULAR | Status: DC
  Administered 2021-05-15 – 2021-05-16 (×2): 80 mg via INTRAVENOUS
  Filled 2021-05-15 (×2): qty 2

## 2021-05-15 MED ORDER — IPRATROPIUM-ALBUTEROL 0.5-2.5 (3) MG/3ML IN SOLN
3.0000 mL | RESPIRATORY_TRACT | Status: DC | PRN
  Administered 2021-05-15: 3 mL via RESPIRATORY_TRACT

## 2021-05-15 MED ORDER — METHYLPREDNISOLONE SODIUM SUCC 40 MG IJ SOLR: 40.0000 mg | Freq: Every day | INTRAMUSCULAR | Status: DC

## 2021-05-15 MED ORDER — IPRATROPIUM-ALBUTEROL 0.5-2.5 (3) MG/3ML IN SOLN: 3.0000 mL | Freq: Four times a day (QID) | RESPIRATORY_TRACT | Status: DC

## 2021-05-15 NOTE — Progress Notes (Signed)
?  Progress Note ? ? ?Patient: Nancy Patton GYI:[id number] DOB: 1960-09-17 DOA: 05/13/2021     1 ?DOS: the patient was seen and examined on 05/15/2021 ?  ?Brief hospital course: ?[name] is a 61 year old female with history of hypertension, non-insulin-dependent diabetes mellitus, depression, history of breast cancer status postlumpectomy, currently on Femara asthma, presents emergency department for chief concerns of shortness of breath. ? ?Vitals in the emergency department showed temperature of 98.3, respiration rate 22, heart rate 95, blood pressure 122/71, SPO2 of 93% on 3 L nasal cannula. ? ?Serum sodium 136, potassium 3.5, chloride 102, bicarb 25, BUN 13, creatinine 0.73, nonfasting blood glucose 160, GFR greater than 60, WBC 7.9, hemoglobin 12.2, platelets of 222. ? ?CTA of the chest: Was read as no evidence of PE.  Patchy multifocal infiltrates in the right middle and left lower lobe consistent with multifocal pneumonia. ? ?ED treatment: DuoNeb's x2, Solu-Medrol 125 mg IV one-time dose, azithromycin 500 mg one-time dose, cefepime 2 g IV one-time dose. ? ?Assessment and Plan: ?* Multifocal pneumonia ?- Continue azithromycin and cefepime per pharmacy ?- continued scheduled IV steroids ?-Cont to wean o2 as needed. Pt is O2 naive  ? ?Cancer of right breast, stage 1, estrogen receptor positive ([place]) ?- Resumed home letrozole 2.5 mg daily ? ?Depression ?- Continued citalopram 40 mg daily resumed ? ?Type 2 diabetes mellitus ([place]) ?- Non-insulin-dependent diabetes mellitus ?- Metformin 1000 mg p.o. twice daily resumed ?- Insulin SSI with agents coverage ordered ? ?HTN (hypertension) ?- Initially continued on llosartan-hydrochlorothiazide 50-12.5 mg p.o. daily ?-SBP soft in the upper 90's, thus bp meds now held ? ?Asthmatic bronchitis with acute exacerbation ?- As needed albuterol and Pulmicort nebulizer ordered ?- Resumed home inhalers ?- Montelukast 10 mg daily resumed ?- active coughing and wheezing  and still dependent on O2, down to [facility] ? ? ? ?  ? ?Subjective: Reports still sob and coughing ? ?Physical Exam: ?Vitals:  ? 05/15/21 0451 05/15/21 0504 05/15/21 0742 05/15/21 1201  ?BP: (!) 89/58  (!) 97/56 99/60  ?Pulse: 68  61 75  ?Resp: '18  17 17  '$ ?Temp: 98 ?F (36.7 ?C)  98.1 ?F (36.7 ?C) 97.8 ?F (36.6 ?C)  ?TempSrc:      ?SpO2: 97% 97% 99% 99%  ?Weight:      ?Height:      ? ?General exam: Conversant, in no acute distress ?Respiratory system: normal chest rise, clear, no audible wheezing ?Cardiovascular system: regular rhythm, s1-s2 ?Gastrointestinal system: Nondistended, nontender, pos BS ?Central nervous system: No seizures, no tremors ?Extremities: No cyanosis, no joint deformities ?Skin: No rashes, no pallor ?Psychiatry: Affect normal // no auditory hallucinations  ? ?Data Reviewed: ? ?Chest CT with patchy multifocal infiltrates in R middle and L lower lobes ? ?Family Communication: Pt in room, family not at bedside ? ?Disposition: ?Status is: Inpatient ?Continue inpatient stay because: Severity of illness, needing O2 and IV steroids ? Planned Discharge Destination: Home ? ? ? ?Author: ?Marylu Lund, MD ?05/15/2021 2:33 PM ? ?For on call review [email or web address]  ?

## 2021-05-16 ENCOUNTER — Inpatient Hospital Stay: Payer: Managed Care, Other (non HMO) | Attending: Oncology

## 2021-05-16 ENCOUNTER — Inpatient Hospital Stay: Payer: Managed Care, Other (non HMO) | Admitting: Oncology

## 2021-05-16 ENCOUNTER — Encounter: Payer: Self-pay | Admitting: Oncology

## 2021-05-16 ENCOUNTER — Other Ambulatory Visit: Payer: Self-pay

## 2021-05-16 VITALS — BP 135/73 | HR 89 | Temp 97.4°F | Resp 20 | Wt 254.5 lb

## 2021-05-16 DIAGNOSIS — Z923 Personal history of irradiation: Secondary | ICD-10-CM | POA: Diagnosis not present

## 2021-05-16 DIAGNOSIS — E119 Type 2 diabetes mellitus without complications: Secondary | ICD-10-CM | POA: Insufficient documentation

## 2021-05-16 DIAGNOSIS — C50911 Malignant neoplasm of unspecified site of right female breast: Secondary | ICD-10-CM | POA: Diagnosis not present

## 2021-05-16 DIAGNOSIS — I10 Essential (primary) hypertension: Secondary | ICD-10-CM | POA: Insufficient documentation

## 2021-05-16 DIAGNOSIS — Z853 Personal history of malignant neoplasm of breast: Secondary | ICD-10-CM

## 2021-05-16 DIAGNOSIS — Z87891 Personal history of nicotine dependence: Secondary | ICD-10-CM | POA: Diagnosis not present

## 2021-05-16 DIAGNOSIS — J9601 Acute respiratory failure with hypoxia: Secondary | ICD-10-CM | POA: Diagnosis not present

## 2021-05-16 DIAGNOSIS — Z08 Encounter for follow-up examination after completed treatment for malignant neoplasm: Secondary | ICD-10-CM

## 2021-05-16 DIAGNOSIS — Z79811 Long term (current) use of aromatase inhibitors: Secondary | ICD-10-CM | POA: Diagnosis not present

## 2021-05-16 DIAGNOSIS — Z170 Estrogen receptor positive status [ER+]: Secondary | ICD-10-CM | POA: Diagnosis not present

## 2021-05-16 DIAGNOSIS — J189 Pneumonia, unspecified organism: Secondary | ICD-10-CM | POA: Diagnosis not present

## 2021-05-16 LAB — COMPREHENSIVE METABOLIC PANEL
ALT: 16 U/L (ref 0–44)
AST: 22 U/L (ref 15–41)
Albumin: 3.7 g/dL (ref 3.5–5.0)
Alkaline Phosphatase: 50 U/L (ref 38–126)
Anion gap: 9 (ref 5–15)
BUN: 18 mg/dL (ref 8–23)
CO2: 26 mmol/L (ref 22–32)
Calcium: 8.9 mg/dL (ref 8.9–10.3)
Chloride: 103 mmol/L (ref 98–111)
Creatinine, Ser: 0.82 mg/dL (ref 0.44–1.00)
GFR, Estimated: >60 mL/min (ref >60)
Glucose, Bld: 237 mg/dL — ABNORMAL HIGH (ref 70–99)
Potassium: 4.1 mmol/L (ref 3.5–5.1)
Sodium: 138 mmol/L (ref 135–145)
Total Bilirubin: 0.2 mg/dL — ABNORMAL LOW (ref 0.3–1.2)
Total Protein: 6.9 g/dL (ref 6.5–8.1)

## 2021-05-16 LAB — CBC WITH DIFFERENTIAL/PLATELET
Abs Immature Granulocytes: 0.04 10*3/uL (ref 0.00–0.07)
Basophils Absolute: 0 10*3/uL (ref 0.0–0.1)
Basophils Relative: 0 %
Eosinophils Absolute: 0 10*3/uL (ref 0.0–0.5)
Eosinophils Relative: 0 %
HCT: 37.3 % (ref 36.0–46.0)
Hemoglobin: 11.9 g/dL — ABNORMAL LOW (ref 12.0–15.0)
Immature Granulocytes: 1 %
Lymphocytes Relative: 11 %
Lymphs Abs: 0.6 10*3/uL — ABNORMAL LOW (ref 0.7–4.0)
MCH: 27.8 pg (ref 26.0–34.0)
MCHC: 31.9 g/dL (ref 30.0–36.0)
MCV: 87.1 fL (ref 80.0–100.0)
Monocytes Absolute: 0.2 10*3/uL (ref 0.1–1.0)
Monocytes Relative: 3 %
Neutro Abs: 4.6 10*3/uL (ref 1.7–7.7)
Neutrophils Relative %: 85 %
Platelets: 244 10*3/uL (ref 150–400)
RBC: 4.28 MIL/uL (ref 3.87–5.11)
RDW: 13.3 % (ref 11.5–15.5)
WBC: 5.5 10*3/uL (ref 4.0–10.5)
nRBC: 0 % (ref 0.0–0.2)

## 2021-05-16 LAB — GLUCOSE, CAPILLARY: Glucose-Capillary: 120 mg/dL — ABNORMAL HIGH (ref 70–99)

## 2021-05-16 MED ORDER — AZITHROMYCIN 250 MG PO TABS: ORAL_TABLET | ORAL | 0 refills | Status: AC

## 2021-05-16 MED ORDER — CEFDINIR 300 MG PO CAPS: 300.0000 mg | ORAL_CAPSULE | Freq: Two times a day (BID) | ORAL | 0 refills | Status: AC

## 2021-05-16 MED ORDER — PREDNISONE 10 MG PO TABS: ORAL_TABLET | ORAL | 0 refills | Status: AC

## 2021-05-16 NOTE — Progress Notes (Signed)
? ? ? ?Hematology/Oncology Consult note ?[place]  ?[phone] [phone] ? ?Patient Care Team: ?Perrin Maltese, MD as PCP - General (Internal Medicine) ?Ronny Bacon, MD as Consulting Physician (General Surgery) ?Rico Junker, RN as Registered Nurse ?Noreene Filbert, MD as Radiation Oncologist (Radiation Oncology) ?Sindy Guadeloupe, MD as Consulting Physician (Oncology)  ? ?Name of the patient: Nancy Patton  ?[id number]  ?02-17-1961  ? ?Date of visit: 05/16/21 ? ?Diagnosis- pathological prognostic stage IA pT1c pN0 cM0 ER positive PR positive HER-2 negative s/p lumpectomy ? ?Chief complaint/ Reason for visit- Routine follow-up of breast cancer on letrozole ? ?Heme/Onc history: Patient is a 60 year old female who recently underwent a screening mammogram which showed a possible mass in the right breast.  This was followed by diagnostic mammogram and ultrasound which showed an irregular related mass 0.6 x 0.6 x 1 cm at the 9 o'clock position 6 cm from the nipple.  This was biopsied and was consistent with invasive mammary carcinoma 7 mm grade ER 91 200% positive PR 1 to 10% positive and HER-2 negative.  Patient has seen Dr. Christian Mate and plans to get a lumpectomy soon.  She has a remote use of birth control pills.  She is postmenopausal.  Menarche at the age of 64.  G3 P3 L3.  No known prior breast biopsies.  No family history of breast cancer, colon gastric or pancreatic cancer. ?  ?Final pathology showed invasive mammary carcinoma no special type. 65m, grade 2.  1 sentinel lymph node negative for malignancy.  Margins negative.  Oncotype score came back at 15 and patient did not require adjuvant chemotherapy.  Patient went on to finish adjuvant radiation treatment.  Letrozole started in August 2021 ? ?Interval history- Patient was recently admitted to the hospitalAnd discharged this morning following an episode of pneumonia requiring IV antibiotics.  She  will be continued on p.o. antibiotics at this time post discharge.  She reports ongoing fatigue but has been able to get off the oxygen.  Reports being compliant with letrozole calcium and vitamin D.  Denies any breast concerns at this time ? ?ECOG PS- 1 ?Pain scale- 0 ? ? ?Review of systems- Review of Systems  ?Constitutional:  Positive for malaise/fatigue. Negative for chills, fever and weight loss.  ?HENT:  Negative for congestion, ear discharge and nosebleeds.   ?Eyes:  Negative for blurred vision.  ?Respiratory:  Negative for cough, hemoptysis, sputum production, shortness of breath and wheezing.   ?Cardiovascular:  Negative for chest pain, palpitations, orthopnea and claudication.  ?Gastrointestinal:  Negative for abdominal pain, blood in stool, constipation, diarrhea, heartburn, melena, nausea and vomiting.  ?Genitourinary:  Negative for dysuria, flank pain, frequency, hematuria and urgency.  ?Musculoskeletal:  Negative for back pain, joint pain and myalgias.  ?Skin:  Negative for rash.  ?Neurological:  Negative for dizziness, tingling, focal weakness, seizures, weakness and headaches.  ?Endo/Heme/Allergies:  Does not bruise/bleed easily.  ?Psychiatric/Behavioral:  Negative for depression and suicidal ideas. The patient does not have insomnia.    ? ? ?No Known Allergies ? ? ?Past Medical History:  ?Diagnosis Date  ? Arthritis   ? Asthma   ? Breast cancer ([place]   ? Diabetes mellitus without complication ([place]   ? Hypertension   ? Obesity   ? Personal history of radiation therapy   ? Pulmonary hypertension ([place]   ? Sleep apnea   ? ? ? ?Past Surgical History:  ?Procedure Laterality Date  ? BREAST LUMPECTOMY Right  05/23/2019  ? invasive DCIS radation   ? CATARACT EXTRACTION    ? CESAREAN SECTION    ? x 3   ? ? ?Social History  ? ?Socioeconomic History  ? Marital status: Single  ?  Spouse name: Not on file  ? Number of children: Not on file  ? Years of education: Not on file  ? Highest education level: Not on  file  ?Occupational History  ? Not on file  ?Tobacco Use  ? Smoking status: Former  ?  Types: Cigarettes  ? Smokeless tobacco: Never  ? Tobacco comments:  ?  Quit smoking 30 years ago.  ?Vaping Use  ? Vaping Use: Never used  ?Substance and Sexual Activity  ? Alcohol use: No  ? Drug use: No  ? Sexual activity: Not Currently  ?Other Topics Concern  ? Not on file  ?Social History Narrative  ? Not on file  ? ?Social Determinants of Health  ? ?Financial Resource Strain: Not on file  ?Food Insecurity: Not on file  ?Transportation Needs: Not on file  ?Physical Activity: Not on file  ?Stress: Not on file  ?Social Connections: Not on file  ?Intimate Partner Violence: Not on file  ? ? ?Family History  ?Problem Relation Age of Onset  ? Diabetes Mother   ? Stroke Mother   ? Diabetes Father   ? Diabetes Brother   ? Cancer Neg Hx   ? Heart failure Neg Hx   ? Hypertension Neg Hx   ? Breast cancer Neg Hx   ? ? ? ?Current Outpatient Medications:  ?  albuterol (VENTOLIN HFA) 108 (90 Base) MCG/ACT inhaler, Inhale 2 puffs into the lungs every 6 (six) hours as needed for wheezing or shortness of breath., Disp: 8 g, Rfl: 2 ?  [START ON 05/17/2021] azithromycin (ZITHROMAX Z-PAK) 250 MG tablet, Take 1 tab daily x 3 more days, zero refills, Disp: 3 each, Rfl: 0 ?  budesonide-formoterol (SYMBICORT) 160-4.5 MCG/ACT inhaler, Inhale 2 puffs into the lungs 2 (two) times daily., Disp: , Rfl:  ?  calcium elemental as carbonate (CALCIUM ANTACID ULTRA) 400 MG chewable tablet, Chew 1,000 mg by mouth 3 (three) times daily., Disp: , Rfl:  ?  [START ON 05/17/2021] cefdinir (OMNICEF) 300 MG capsule, Take 1 capsule (300 mg total) by mouth 2 (two) times daily for 3 days., Disp: 6 capsule, Rfl: 0 ?  citalopram (CELEXA) 40 MG tablet, Take 40 mg by mouth daily. , Disp: , Rfl: 10 ?  D3-50 1.25 MG (50000 UT) capsule, Take by mouth once a week., Disp: , Rfl:  ?  ipratropium-albuterol (DUONEB) 0.5-2.5 (3) MG/3ML SOLN, Take by nebulization 3 (three) times daily as  needed., Disp: , Rfl:  ?  letrozole (FEMARA) 2.5 MG tablet, TAKE 1 TABLET BY MOUTH EVERY DAY (DON'T START MED UNTIL YOU COMPLETE RADIATION 1ST), Disp: 90 tablet, Rfl: 2 ?  losartan-hydrochlorothiazide (HYZAAR) 50-12.5 MG per tablet, Take 1 tablet by mouth daily., Disp: , Rfl: 5 ?  metFORMIN (GLUCOPHAGE) 1000 MG tablet, Take 1,000 mg by mouth 2 (two) times daily., Disp: , Rfl: 2 ?  montelukast (SINGULAIR) 10 MG tablet, Take 10 mg by mouth every morning. , Disp: , Rfl:  ?  predniSONE (DELTASONE) 10 MG tablet, Take 6 tablets (60 mg total) by mouth daily for 2 days, THEN 4 tablets (40 mg total) daily for 2 days, THEN 2 tablets (20 mg total) daily for 2 days, THEN 1 tablet (10 mg total) daily for 2 days, THEN  0.5 tablets (5 mg total) daily for 2 days., Disp: 27 tablet, Rfl: 0 ?  TRELEGY ELLIPTA 100-62.5-25 MCG/ACT AEPB, Take 1 puff by mouth daily., Disp: , Rfl:  ? ?Physical exam:  ?Vitals:  ? 05/16/21 1437  ?BP: 135/73  ?Pulse: 89  ?Resp: 20  ?Temp: (!) 97.4 ?F (36.3 ?C)  ?SpO2: 92%  ?Weight: 254 lb 8 oz (115.4 kg)  ? ?Physical Exam ?Constitutional:   ?   General: She is not in acute distress. ?   Appearance: She is obese.  ?Cardiovascular:  ?   Rate and Rhythm: Normal rate and regular rhythm.  ?   Heart sounds: Normal heart sounds.  ?Pulmonary:  ?   Effort: Pulmonary effort is normal.  ?   Breath sounds: Normal breath sounds.  ?Abdominal:  ?   General: Bowel sounds are normal.  ?   Palpations: Abdomen is soft.  ?Skin: ?   General: Skin is warm and dry.  ?Neurological:  ?   Mental Status: She is alert and oriented to person, place, and time.  ? Breast exam was performed in seated and lying down position. ?Patient is status post right lumpectomy with a well-healed surgical scar. No evidence of any palpable masses. No evidence of axillary adenopathy. No evidence of any palpable masses or lumps in the left breast. No evidence of leftt axillary adenopathy ? ? ? ?  Latest Ref Rng & Units 05/16/2021  ?  2:21 PM  ?CMP  ?Glucose  70 - 99 mg/dL 237    ?BUN 8 - 23 mg/dL 18    ?Creatinine 0.44 - 1.00 mg/dL 0.82    ?Sodium 135 - 145 mmol/L 138    ?Potassium 3.5 - 5.1 mmol/L 4.1    ?Chloride 98 - 111 mmol/L 103    ?CO2 22 - 32 mmol/L 26

## 2021-05-16 NOTE — Discharge Summary (Signed)
?Physician Discharge Summary ?  ?Patient: Nancy Patton MRN: [id number] DOB: 07-02-1960  ?Admit date:     05/13/2021  ?Discharge date: 05/16/21  ?Discharge Physician: Marylu Lund  ? ?PCP: Perrin Maltese, MD  ? ?Recommendations at discharge:  ? ? Follow up with PCP in 1-2 weeks ? ?Discharge Diagnoses: ?Principal Problem: ?  Multifocal pneumonia ?Active Problems: ?  Acute respiratory distress ?  Asthmatic bronchitis with acute exacerbation ?  HTN (hypertension) ?  Type 2 diabetes mellitus ([place]) ?  Depression ?  Cancer of right breast, stage 1, estrogen receptor positive ([place]) ?  Status post breast lumpectomy ?  History of breast cancer ? ?Resolved Problems: ?  * No resolved hospital problems. * ? ?Hospital Course: ?[name] is a 61 year old female with history of hypertension, non-insulin-dependent diabetes mellitus, depression, history of breast cancer status postlumpectomy, currently on Femara asthma, presents emergency department for chief concerns of shortness of breath. ? ?Vitals in the emergency department showed temperature of 98.3, respiration rate 22, heart rate 95, blood pressure 122/71, SPO2 of 93% on 3 L nasal cannula. ? ?Serum sodium 136, potassium 3.5, chloride 102, bicarb 25, BUN 13, creatinine 0.73, nonfasting blood glucose 160, GFR greater than 60, WBC 7.9, hemoglobin 12.2, platelets of 222. ? ?CTA of the chest: Was read as no evidence of PE.  Patchy multifocal infiltrates in the right middle and left lower lobe consistent with multifocal pneumonia. ? ?ED treatment: DuoNeb's x2, Solu-Medrol 125 mg IV one-time dose, azithromycin 500 mg one-time dose, cefepime 2 g IV one-time dose. ? ?Assessment and Plan: ?* Multifocal pneumonia ?- Continued azithromycin and cefepime per pharmacy ?- continued scheduled IV steroids, to continue prednisone taper on d/c ?-Weaned to room air ?-Patient to complete azithromycin and omnicef on d/c ? ?Cancer of right breast, stage 1, estrogen receptor positive  ([place]) ?- Resumed home letrozole 2.5 mg daily ? ?Depression ?- Continued citalopram 40 mg daily resumed ? ?Type 2 diabetes mellitus ([place]) ?- Non-insulin-dependent diabetes mellitus ?- Metformin 1000 mg p.o. twice daily resumed ?- Insulin SSI with agents coverage ordered while in hospital ?-Resume home meds on d/c ? ?HTN (hypertension) ?- Initially continued on llosartan-hydrochlorothiazide 50-12.5 mg p.o. daily ?-SBP soft in the upper 90's, thus bp meds now held while in hospital ?-Resume home meds on d/c ? ?Asthmatic bronchitis with acute exacerbation ?- As needed albuterol and Pulmicort nebulizer ordered ?- Resumed home inhalers ?- Montelukast 10 mg daily resumed ?- active coughing and wheezing improved and patient was weaned to room air, able to ambulate without difficulty ? ? ? ? ?  ? ? ?Consultants:  ?Procedures performed:   ?Disposition: Home ?Diet recommendation:  ?Carb modified diet ?DISCHARGE MEDICATION: ?Allergies as of 05/16/2021   ?No Known Allergies ?  ? ?  ?Medication List  ?  ? ?TAKE these medications   ? ?albuterol 108 (90 Base) MCG/ACT inhaler ?Commonly known as: VENTOLIN HFA ?Inhale 2 puffs into the lungs every 6 (six) hours as needed for wheezing or shortness of breath. ?  ?azithromycin 250 MG tablet ?Commonly known as: Zithromax Z-Pak ?Take 1 tab daily x 3 more days, zero refills ?Start taking on: May 17, 2021 ?  ?budesonide-formoterol 160-4.5 MCG/ACT inhaler ?Commonly known as: SYMBICORT ?Inhale 2 puffs into the lungs 2 (two) times daily. ?  ?Calcium Antacid Ultra 400 MG chewable tablet ?Generic drug: calcium elemental as carbonate ?Chew 1,000 mg by mouth 3 (three) times daily. ?  ?cefdinir 300 MG capsule ?Commonly known as: OMNICEF ?  Take 1 capsule (300 mg total) by mouth 2 (two) times daily for 3 days. ?Start taking on: May 17, 2021 ?  ?citalopram 40 MG tablet ?Commonly known as: CELEXA ?Take 40 mg by mouth daily. ?  ?D3-50 1.25 MG (50000 UT) capsule ?Generic drug: Cholecalciferol ?Take by  mouth once a week. ?  ?ipratropium-albuterol 0.5-2.5 (3) MG/3ML Soln ?Commonly known as: DUONEB ?Take by nebulization 3 (three) times daily as needed. ?  ?letrozole 2.5 MG tablet ?Commonly known as: [place] ?TAKE 1 TABLET BY MOUTH EVERY DAY (DON'T START MED UNTIL YOU COMPLETE RADIATION 1ST) ?  ?losartan-hydrochlorothiazide 50-12.5 MG tablet ?Commonly known as: HYZAAR ?Take 1 tablet by mouth daily. ?  ?metFORMIN 1000 MG tablet ?Commonly known as: GLUCOPHAGE ?Take 1,000 mg by mouth 2 (two) times daily. ?  ?montelukast 10 MG tablet ?Commonly known as: SINGULAIR ?Take 10 mg by mouth every morning. ?  ?predniSONE 10 MG tablet ?Commonly known as: DELTASONE ?Take 6 tablets (60 mg total) by mouth daily for 2 days, THEN 4 tablets (40 mg total) daily for 2 days, THEN 2 tablets (20 mg total) daily for 2 days, THEN 1 tablet (10 mg total) daily for 2 days, THEN 0.5 tablets (5 mg total) daily for 2 days. ?Start taking on: May 16, 2021 ?What changed:  ?medication strength ?See the new instructions. ?  ?Trelegy Ellipta 100-62.5-25 MCG/ACT Aepb ?Generic drug: Fluticasone-Umeclidin-Vilant ?Take 1 puff by mouth daily. ?  ? ?  ? ? Follow-up Information   ? ? Perrin Maltese, MD Follow up in 2 week(s).   ?Specialty: Internal Medicine ?Why: Hospital follow up ?Contact information: ?[address] ?[place] [address] ?[phone] ? ? ?  ?  ? ? Sindy Guadeloupe, MD Follow up.   ?Specialty: Oncology ?Why: as scheduled ?Contact information: ?[place] [address] ?[phone] ? ? ?  ?  ? ?  ?  ? ?  ? ?Discharge Exam: ?Filed Weights  ? 05/13/21 1831 05/14/21 0021  ?Weight: 112.9 kg 111 kg  ? ?General exam: Awake, laying in bed, in nad ?Respiratory system: Normal respiratory effort, no wheezing ?Cardiovascular system: regular rate, s1, s2 ?Gastrointestinal system: Soft, nondistended, positive BS ?Central nervous system: CN2-12 grossly intact, strength intact ?Extremities: Perfused, no clubbing ?Skin: Normal skin turgor,  no notable skin lesions seen ?Psychiatry: Mood normal // no visual hallucinations  ? ?Condition at discharge: good ? ?The results of significant diagnostics from this hospitalization (including imaging, microbiology, ancillary and laboratory) are listed below for reference.  ? ?Imaging Studies: ?DG Chest 2 View ? ?Result Date: 05/13/2021 ?CLINICAL DATA:  Shortness of breath EXAM: CHEST - 2 VIEW COMPARISON:  02/17/2021 FINDINGS: Frontal and lateral views of the chest demonstrate a stable cardiac silhouette. Patchy lingular airspace disease consistent with bronchopneumonia. No effusion or pneumothorax. No acute bony abnormality. IMPRESSION: 1. Patchy lingular consolidation consistent with bronchopneumonia. Electronically Signed   By: Randa Ngo M.D.   On: 05/13/2021 19:12  ? ?CT Angio Chest PE W and/or Wo Contrast ? ?Result Date: 05/13/2021 ?CLINICAL DATA:  Shortness of breath and wheezing for several days EXAM: CT ANGIOGRAPHY CHEST WITH CONTRAST TECHNIQUE: Multidetector CT imaging of the chest was performed using the standard protocol during bolus administration of intravenous contrast. Multiplanar CT image reconstructions and MIPs were obtained to evaluate the vascular anatomy. RADIATION DOSE REDUCTION: This exam was performed according to the departmental dose-optimization program which includes automated exposure control, adjustment of the mA and/or kV according to patient size and/or use  of iterative reconstruction technique. CONTRAST:  182m OMNIPAQUE IOHEXOL 350 MG/ML SOLN COMPARISON:  Chest x-ray from earlier in the same day. FINDINGS: Cardiovascular: Thoracic aorta shows no evidence of aneurysmal dilatation or dissection. No cardiac enlargement is noted. No significant coronary calcifications are noted. The pulmonary artery shows a normal branching pattern bilaterally. No definitive filling defect is noted. Mediastinum/Nodes: Thoracic inlet is within normal limits. Scattered small mediastinal lymph nodes  are noted but not significant by size criteria. These are likely reactive in nature. The esophagus as visualized is within normal limits. Lungs/Pleura: Lungs are well aerated bilaterally. Patchy airspace o

## 2021-05-17 LAB — LEGIONELLA PNEUMOPHILA SEROGP 1 UR AG: L. pneumophila Serogp 1 Ur Ag: NEGATIVE

## 2021-05-18 LAB — CULTURE, BLOOD (ROUTINE X 2)
Culture: NO GROWTH
Culture: NO GROWTH
Special Requests: ADEQUATE

## 2021-06-15 ENCOUNTER — Other Ambulatory Visit: Payer: Self-pay | Admitting: Nurse Practitioner

## 2021-07-05 ENCOUNTER — Encounter: Payer: Self-pay | Admitting: Emergency Medicine

## 2021-07-05 ENCOUNTER — Emergency Department: Payer: Managed Care, Other (non HMO)

## 2021-07-05 ENCOUNTER — Inpatient Hospital Stay
Admission: EM | Admit: 2021-07-05 | Discharge: 2021-07-06 | DRG: 189 | Disposition: A | Discharge status: Home / Self Care | Payer: Managed Care, Other (non HMO) | Attending: Internal Medicine | Admitting: Internal Medicine

## 2021-07-05 ENCOUNTER — Other Ambulatory Visit: Payer: Self-pay

## 2021-07-05 DIAGNOSIS — G4733 Obstructive sleep apnea (adult) (pediatric): Secondary | ICD-10-CM | POA: Diagnosis present

## 2021-07-05 DIAGNOSIS — Z853 Personal history of malignant neoplasm of breast: Secondary | ICD-10-CM | POA: Diagnosis not present

## 2021-07-05 DIAGNOSIS — Z923 Personal history of irradiation: Secondary | ICD-10-CM

## 2021-07-05 DIAGNOSIS — Z7951 Long term (current) use of inhaled steroids: Secondary | ICD-10-CM | POA: Diagnosis not present

## 2021-07-05 DIAGNOSIS — I2729 Other secondary pulmonary hypertension: Secondary | ICD-10-CM | POA: Diagnosis present

## 2021-07-05 DIAGNOSIS — R0602 Shortness of breath: Secondary | ICD-10-CM | POA: Diagnosis present

## 2021-07-05 DIAGNOSIS — E119 Type 2 diabetes mellitus without complications: Secondary | ICD-10-CM | POA: Diagnosis present

## 2021-07-05 DIAGNOSIS — Z79899 Other long term (current) drug therapy: Secondary | ICD-10-CM | POA: Diagnosis not present

## 2021-07-05 DIAGNOSIS — Z6841 Body Mass Index (BMI) 40.0 and over, adult: Secondary | ICD-10-CM

## 2021-07-05 DIAGNOSIS — J45901 Unspecified asthma with (acute) exacerbation: Secondary | ICD-10-CM | POA: Diagnosis present

## 2021-07-05 DIAGNOSIS — I10 Essential (primary) hypertension: Secondary | ICD-10-CM | POA: Diagnosis present

## 2021-07-05 DIAGNOSIS — A419 Sepsis, unspecified organism: Secondary | ICD-10-CM | POA: Diagnosis present

## 2021-07-05 DIAGNOSIS — I110 Hypertensive heart disease with heart failure: Secondary | ICD-10-CM | POA: Diagnosis present

## 2021-07-05 DIAGNOSIS — Z9849 Cataract extraction status, unspecified eye: Secondary | ICD-10-CM

## 2021-07-05 DIAGNOSIS — Z823 Family history of stroke: Secondary | ICD-10-CM

## 2021-07-05 DIAGNOSIS — J9601 Acute respiratory failure with hypoxia: Principal | ICD-10-CM | POA: Diagnosis present

## 2021-07-05 DIAGNOSIS — J189 Pneumonia, unspecified organism: Secondary | ICD-10-CM | POA: Diagnosis present

## 2021-07-05 DIAGNOSIS — Z87891 Personal history of nicotine dependence: Secondary | ICD-10-CM | POA: Diagnosis not present

## 2021-07-05 DIAGNOSIS — A084 Viral intestinal infection, unspecified: Secondary | ICD-10-CM | POA: Diagnosis present

## 2021-07-05 DIAGNOSIS — I50810 Right heart failure, unspecified: Secondary | ICD-10-CM | POA: Diagnosis present

## 2021-07-05 DIAGNOSIS — Z833 Family history of diabetes mellitus: Secondary | ICD-10-CM

## 2021-07-05 DIAGNOSIS — E669 Obesity, unspecified: Secondary | ICD-10-CM | POA: Diagnosis present

## 2021-07-05 DIAGNOSIS — J9811 Atelectasis: Secondary | ICD-10-CM | POA: Diagnosis present

## 2021-07-05 HISTORY — DX: Sepsis, unspecified organism: A41.9

## 2021-07-05 LAB — COMPREHENSIVE METABOLIC PANEL
ALT: 20 U/L (ref 0–44)
AST: 24 U/L (ref 15–41)
Albumin: 4.2 g/dL (ref 3.5–5.0)
Alkaline Phosphatase: 58 U/L (ref 38–126)
Anion gap: 12 (ref 5–15)
BUN: 16 mg/dL (ref 8–23)
CO2: 25 mmol/L (ref 22–32)
Calcium: 9.6 mg/dL (ref 8.9–10.3)
Chloride: 104 mmol/L (ref 98–111)
Creatinine, Ser: 0.93 mg/dL (ref 0.44–1.00)
GFR, Estimated: >60 mL/min (ref >60)
Glucose, Bld: 147 mg/dL — ABNORMAL HIGH (ref 70–99)
Potassium: 3.3 mmol/L — ABNORMAL LOW (ref 3.5–5.1)
Sodium: 141 mmol/L (ref 135–145)
Total Bilirubin: 0.7 mg/dL (ref 0.3–1.2)
Total Protein: 7.7 g/dL (ref 6.5–8.1)

## 2021-07-05 LAB — APTT: aPTT: 25 seconds (ref 24–36)

## 2021-07-05 LAB — LACTIC ACID, PLASMA: Lactic Acid, Venous: 1.5 mmol/L (ref 0.5–1.9)

## 2021-07-05 LAB — PROTIME-INR
INR: 1 (ref 0.8–1.2)
Prothrombin Time: 13.4 seconds (ref 11.4–15.2)

## 2021-07-05 LAB — CBC
HCT: 44.6 % (ref 36.0–46.0)
Hemoglobin: 14.2 g/dL (ref 12.0–15.0)
MCH: 27.7 pg (ref 26.0–34.0)
MCHC: 31.8 g/dL (ref 30.0–36.0)
MCV: 87.1 fL (ref 80.0–100.0)
Platelets: 217 10*3/uL (ref 150–400)
RBC: 5.12 MIL/uL — ABNORMAL HIGH (ref 3.87–5.11)
RDW: 13.8 % (ref 11.5–15.5)
WBC: 6.5 10*3/uL (ref 4.0–10.5)
nRBC: 0 % (ref 0.0–0.2)

## 2021-07-05 LAB — TROPONIN I (HIGH SENSITIVITY)
Troponin I (High Sensitivity): 4 ng/L (ref <18)
Troponin I (High Sensitivity): 5 ng/L (ref <18)

## 2021-07-05 LAB — BRAIN NATRIURETIC PEPTIDE: B Natriuretic Peptide: 8.1 pg/mL (ref 0.0–100.0)

## 2021-07-05 LAB — LIPASE, BLOOD: Lipase: 36 U/L (ref 11–51)

## 2021-07-05 LAB — D-DIMER, QUANTITATIVE: D-Dimer, Quant: 0.33 ug/mL-FEU (ref 0.00–0.50)

## 2021-07-05 MED ORDER — LACTATED RINGERS IV SOLN: INTRAVENOUS | Status: DC

## 2021-07-05 MED ORDER — GUAIFENESIN ER 600 MG PO TB12: 600.0000 mg | ORAL_TABLET | Freq: Two times a day (BID) | ORAL | Status: DC | PRN

## 2021-07-05 MED ORDER — SODIUM CHLORIDE 0.9 % IV SOLN
2.0000 g | Freq: Three times a day (TID) | INTRAVENOUS | Status: DC
  Administered 2021-07-06: 2 g via INTRAVENOUS
  Filled 2021-07-05 (×2): qty 12.5

## 2021-07-05 MED ORDER — MONTELUKAST SODIUM 10 MG PO TABS
10.0000 mg | ORAL_TABLET | Freq: Every morning | ORAL | Status: DC
Start: 2021-07-06 — End: 2021-07-06
  Administered 2021-07-06: 10 mg via ORAL
  Filled 2021-07-05: qty 1

## 2021-07-05 MED ORDER — HEPARIN SODIUM (PORCINE) 5000 UNIT/ML IJ SOLN
5000.0000 [IU] | Freq: Three times a day (TID) | INTRAMUSCULAR | Status: DC
  Administered 2021-07-05 – 2021-07-06 (×2): 5000 [IU] via SUBCUTANEOUS
  Filled 2021-07-05 (×3): qty 1

## 2021-07-05 MED ORDER — LOSARTAN POTASSIUM-HCTZ 50-12.5 MG PO TABS
1.0000 | ORAL_TABLET | Freq: Every day | ORAL | Status: DC
Start: 2021-07-06 — End: 2021-07-05

## 2021-07-05 MED ORDER — INSULIN ASPART 100 UNIT/ML IJ SOLN
0.0000 [IU] | Freq: Three times a day (TID) | INTRAMUSCULAR | Status: DC
  Administered 2021-07-06: 3 [IU] via SUBCUTANEOUS
  Administered 2021-07-06: 2 [IU] via SUBCUTANEOUS
  Filled 2021-07-05 (×2): qty 1

## 2021-07-05 MED ORDER — ALBUTEROL SULFATE HFA 108 (90 BASE) MCG/ACT IN AERS: 2.0000 | INHALATION_SPRAY | Freq: Four times a day (QID) | RESPIRATORY_TRACT | Status: DC | PRN

## 2021-07-05 MED ORDER — VANCOMYCIN HCL IN DEXTROSE 1-5 GM/200ML-% IV SOLN: 1000.0000 mg | Freq: Once | INTRAVENOUS | Status: DC

## 2021-07-05 MED ORDER — ONDANSETRON HCL 4 MG/2ML IJ SOLN
4.0000 mg | Freq: Once | INTRAMUSCULAR | Status: AC | PRN
Start: 2021-07-05 — End: 2021-07-05
  Administered 2021-07-05: 4 mg via INTRAVENOUS
  Filled 2021-07-05: qty 2

## 2021-07-05 MED ORDER — LACTATED RINGERS IV BOLUS
2000.0000 mL | Freq: Once | INTRAVENOUS | Status: AC
  Administered 2021-07-05: 2000 mL via INTRAVENOUS

## 2021-07-05 MED ORDER — IPRATROPIUM-ALBUTEROL 0.5-2.5 (3) MG/3ML IN SOLN
3.0000 mL | Freq: Once | RESPIRATORY_TRACT | Status: AC
Start: 2021-07-05 — End: 2021-07-05
  Administered 2021-07-05: 3 mL via RESPIRATORY_TRACT
  Filled 2021-07-05: qty 3

## 2021-07-05 MED ORDER — LOSARTAN POTASSIUM 50 MG PO TABS
50.0000 mg | ORAL_TABLET | Freq: Every day | ORAL | Status: DC
Start: 2021-07-06 — End: 2021-07-06
  Administered 2021-07-06: 50 mg via ORAL
  Filled 2021-07-05: qty 1

## 2021-07-05 MED ORDER — SODIUM CHLORIDE 0.9 % IV SOLN
2.0000 g | Freq: Once | INTRAVENOUS | Status: AC
  Administered 2021-07-05: 2 g via INTRAVENOUS
  Filled 2021-07-05: qty 12.5

## 2021-07-05 MED ORDER — MOMETASONE FURO-FORMOTEROL FUM 200-5 MCG/ACT IN AERO: 2.0000 | INHALATION_SPRAY | Freq: Two times a day (BID) | RESPIRATORY_TRACT | Status: DC

## 2021-07-05 MED ORDER — IPRATROPIUM-ALBUTEROL 0.5-2.5 (3) MG/3ML IN SOLN: 3.0000 mL | Freq: Four times a day (QID) | RESPIRATORY_TRACT | Status: DC | PRN

## 2021-07-05 MED ORDER — VANCOMYCIN HCL 2000 MG/400ML IV SOLN
2000.0000 mg | Freq: Once | INTRAVENOUS | Status: AC
  Administered 2021-07-05: 2000 mg via INTRAVENOUS
  Filled 2021-07-05: qty 400

## 2021-07-05 MED ORDER — HYDROCHLOROTHIAZIDE 12.5 MG PO TABS
12.5000 mg | ORAL_TABLET | Freq: Every day | ORAL | Status: DC
  Administered 2021-07-06: 12.5 mg via ORAL
  Filled 2021-07-05: qty 1

## 2021-07-05 MED ORDER — CITALOPRAM HYDROBROMIDE 10 MG PO TABS
40.0000 mg | ORAL_TABLET | Freq: Every day | ORAL | Status: DC
  Administered 2021-07-06: 40 mg via ORAL
  Filled 2021-07-05: qty 4

## 2021-07-05 MED ORDER — ACETAMINOPHEN 650 MG RE SUPP: 650.0000 mg | Freq: Four times a day (QID) | RECTAL | Status: DC | PRN

## 2021-07-05 MED ORDER — ACETAMINOPHEN 325 MG PO TABS: 650.0000 mg | ORAL_TABLET | Freq: Four times a day (QID) | ORAL | Status: DC | PRN

## 2021-07-05 MED ORDER — HYDRALAZINE HCL 20 MG/ML IJ SOLN: 5.0000 mg | INTRAMUSCULAR | Status: DC | PRN

## 2021-07-05 MED ORDER — DOCUSATE SODIUM 100 MG PO CAPS
100.0000 mg | ORAL_CAPSULE | Freq: Two times a day (BID) | ORAL | Status: DC
  Administered 2021-07-05 – 2021-07-06 (×2): 100 mg via ORAL
  Filled 2021-07-05 (×2): qty 1

## 2021-07-05 MED ORDER — ALBUTEROL SULFATE (2.5 MG/3ML) 0.083% IN NEBU: 2.5000 mg | INHALATION_SOLUTION | RESPIRATORY_TRACT | Status: DC | PRN

## 2021-07-05 MED ORDER — VANCOMYCIN HCL 1250 MG/250ML IV SOLN
1250.0000 mg | INTRAVENOUS | Status: DC
  Filled 2021-07-05: qty 250

## 2021-07-05 MED ORDER — METHYLPREDNISOLONE SODIUM SUCC 125 MG IJ SOLR
125.0000 mg | Freq: Once | INTRAMUSCULAR | Status: AC
  Administered 2021-07-05: 125 mg via INTRAVENOUS
  Filled 2021-07-05: qty 2

## 2021-07-05 NOTE — Progress Notes (Signed)
PHARMACY -  BRIEF ANTIBIOTIC NOTE  ? ?Pharmacy has received consult(s) for Cefepime and Vancomycin from an ED provider.  The patient's profile has been reviewed for ht/wt/ allergies /indication/ available labs.   ? ?One time order(s) placed for Cefepime 2 gm and Vancomycin 2 gm per pt wt 112.5 kg. ? ?Further antibiotics/pharmacy consults should be ordered by admitting physician if indicated.       ?                ?Thank you, ?Renda Rolls, PharmD, MBA ?07/05/2021 ?10:18 PM ? ?

## 2021-07-05 NOTE — ED Triage Notes (Signed)
Pt arrived via POV with reports of vomiting since yesterday, reports shortness of breath also starting yesterday.  Pt reports she was hospitalized with PNA  about 1 month ago for 3-4 days. Does not wear oxygen at home but did have to wear oxygen while in the hospital. ? ?Pt placed on 4L [place] while in triage.  ?

## 2021-07-05 NOTE — ED Provider Notes (Signed)
? ?[facility] ?Provider Note ? ? ? Event Date/Time  ? First MD Initiated Contact with Patient 07/05/21 2052   ?  (approximate) ? ? ?History  ? ?Emesis and Shortness of Breath ? ? ?HPI ? ?Nancy Patton is a 61 y.o. female who presents to the ED for evaluation of Emesis and Shortness of Breath ?  ?I review DC summary from 3/27 where she was admitted for multifocal pneumonia causing acute hypoxic respiratory failure.  She is a history of HTN, DM, depression, breast cancer s/p lumpectomy.  Discharged with Omnicef and azithromycin. ? ?Patient presents to the ED for evaluation of emesis, inability to keep anything down and a productive cough for the past couple days.  She reports feeling bad and generalized fashion but denies any abdominal pain. ? ?Physical Exam  ? ?Triage Vital Signs: ?ED Triage Vitals  ?Enc Vitals Group  ?   BP 07/05/21 2034 136/85  ?   Pulse Rate 07/05/21 2034 (!) 108  ?   Resp 07/05/21 2034 (!) 22  ?   Temp 07/05/21 2034 98.9 ?F (37.2 ?C)  ?   Temp Source 07/05/21 2034 Oral  ?   SpO2 07/05/21 2034 (!) 85 %  ?   Weight 07/05/21 2041 248 lb (112.5 kg)  ?   Height 07/05/21 2041 5' 0.6" (1.539 m)  ?   Head Circumference --   ?   Peak Flow --   ?   Pain Score 07/05/21 2041 0  ?   Pain Loc --   ?   Pain Edu? --   ?   Excl. in [place]? --   ? ? ?Most recent vital signs: ?Vitals:  ? 07/05/21 2041 07/05/21 2046  ?BP:    ?Pulse:    ?Resp:    ?Temp:    ?SpO2: (!) 89% 91%  ? ? ?General: Awake, no distress.  Appears uncomfortable.  No distress.  Conversational. ?CV:  Good peripheral perfusion.  Tachycardic and regular ?Resp:  Tachypneic, wheezing throughout, poor airflow and with right basilar crackles. ?Abd:  No distention.  Soft and benign throughout ?MSK:  No deformity noted.  ?Neuro:  No focal deficits appreciated. ?Other:   ? ? ?ED Results / Procedures / Treatments  ? ?Labs ?(all labs ordered are listed, but only abnormal results are displayed) ?Labs Reviewed  ?COMPREHENSIVE  METABOLIC PANEL - Abnormal; Notable for the following components:  ?    Result Value  ? Potassium 3.3 (*)   ? Glucose, Bld 147 (*)   ? All other components within normal limits  ?CBC - Abnormal; Notable for the following components:  ? RBC 5.12 (*)   ? All other components within normal limits  ?LIPASE, BLOOD  ?URINALYSIS, ROUTINE W REFLEX MICROSCOPIC  ?TROPONIN I (HIGH SENSITIVITY)  ? ? ?EKG ?Sinus rhythm, rate of 102 bpm.  Normal axis and intervals.  No clear evidence of acute ischemia. ? ?RADIOLOGY ?1 view CXR interpreted by me with RLL airspace opacity ? ?Official radiology report(s): ?DG Chest 1 View ? ?Result Date: 07/05/2021 ?CLINICAL DATA:  Shortness of breath, hypoxia, and vomiting beginning yesterday. EXAM: CHEST  1 VIEW COMPARISON:  05/13/2021 FINDINGS: The heart size and mediastinal contours are within normal limits. Asymmetric airspace opacity is seen in the right lower lobe, suspicious for pneumonia. No evidence of pleural effusion. IMPRESSION: Right lower lobe airspace opacity, suspicious for pneumonia. Recommend continued chest radiographic follow-up to confirm resolution. Electronically Signed   By: Myles Rosenthal.D.  On: 07/05/2021 21:08   ? ?PROCEDURES and INTERVENTIONS: ? ?.1-3 Lead EKG Interpretation ?Performed by: Vladimir Crofts, MD ?Authorized by: Vladimir Crofts, MD  ? ?  Interpretation: abnormal   ?  ECG rate:  101 ?  ECG rate assessment: tachycardic   ?  Rhythm: sinus tachycardia   ?  Ectopy: none   ?  Conduction: normal   ?.Critical Care ?Performed by: Vladimir Crofts, MD ?Authorized by: Vladimir Crofts, MD  ? ?Critical care provider statement:  ?  Critical care time (minutes):  30 ?  Critical care time was exclusive of:  Separately billable procedures and treating other patients ?  Critical care was necessary to treat or prevent imminent or life-threatening deterioration of the following conditions:  Sepsis ?  Critical care was time spent personally by me on the following activities:  Development of  treatment plan with patient or surrogate, discussions with consultants, evaluation of patient's response to treatment, examination of patient, ordering and review of laboratory studies, ordering and review of radiographic studies, ordering and performing treatments and interventions, pulse oximetry, re-evaluation of patient's condition and review of old charts ? ?Medications  ?ondansetron (ZOFRAN) injection 4 mg (has no administration in time range)  ? ? ? ?IMPRESSION / MDM / ASSESSMENT AND PLAN / ED COURSE  ?I reviewed the triage vital signs and the nursing notes. ? ?Differential diagnosis includes, but is not limited to, pneumonia, sepsis, pneumothorax, cholecystitis, pancreatitis ? ?{Patient presents with an acute illness or injury that is potentially life-threatening. ? ?Patient presents to the ED with emesis and productive cough, with evidence of recurrent HCAP and associated sepsis requiring medical admission.  She was just here about a month ago for pneumonia and has recurrence of sepsis due to this.  Unsure if she has any underlying structural pathology or obstructive pathology to precipitate this.  Denies any aspiration events, but this is also a possibility.  Blood work with no leukocytosis, renal dysfunction or signs of pancreatitis.  No signs of ACS or pneumothorax.  Initiated sepsis protocol for HCAP and consulted with medicine who agrees to admit. ? ?Clinical Course as of 07/05/21 2244  ?Tue Jul 05, 2021  ?2243 I consult hospitalist who agrees to admit. [DS]  ?  ?Clinical Course User Index ?[DS] Vladimir Crofts, MD  ? ? ? ?FINAL CLINICAL IMPRESSION(S) / ED DIAGNOSES  ? ?Final diagnoses:  ?None  ? ? ? ?Rx / DC Orders  ? ?ED Discharge Orders   ? ? None  ? ?  ? ? ? ?Note:  This document was prepared using Dragon voice recognition software and may include unintentional dictation errors. ?  ?Vladimir Crofts, MD ?07/05/21 2247 ? ?

## 2021-07-05 NOTE — H&P (Signed)
?History and Physical  ? ? ?Patient: Nancy Patton POE:[id number] DOB: [birth year] ?DOA: 07/05/2021 ?DOS: the patient was seen and examined on 07/06/2021 ?PCP: Perrin Maltese, MD  ?Patient coming from: Home ? ?Chief Complaint:  ?Chief Complaint  ?Patient presents with  ? Emesis  ? Shortness of Breath  ? ?HPI: Nancy Patton is a 61 y.o. female with medical history significant of Htn, obesity, breast cancer being followed by dr. Janese Banks, osa, presenting with vomiting x 2 days and SOB.  ?Pt was here in march for pneumonia and is concerned about why she is having pneumonia. No h/o PUD or Hiatal hernia. On arrival pt is hypoxic at 85% requiring oxygen. And meets sepsis criteria. Pt given abx coverage with vanc and cefepime . 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  We will get bnp and dimer and echo and cta if needed. ? ?Review of Systems  ?Respiratory:  Positive for cough and shortness of breath.   ?Gastrointestinal:  Positive for vomiting.  ?All other systems reviewed and are negative. ? ?Past Medical History:  ?Diagnosis Date  ? Arthritis   ? Asthma   ? Breast cancer ([place])   ? Diabetes mellitus without complication ([place])   ? Hypertension   ? Obesity   ? Personal history of radiation therapy   ? Pulmonary hypertension ([place])   ? Sleep apnea   ? ?Past Surgical History:  ?Procedure Laterality Date  ? BREAST LUMPECTOMY Right 05/23/2019  ? invasive DCIS radation   ? CATARACT EXTRACTION    ? CESAREAN SECTION    ? x 3   ? ?Social History:  reports that she has quit smoking. Her smoking use included cigarettes. She has never used smokeless tobacco. She reports that she does not drink alcohol and does not use drugs. ? ?No Known Allergies ? ?Family History  ?Problem Relation Age of Onset  ? Diabetes Mother   ? Stroke Mother   ? Diabetes Father   ? Diabetes Brother   ? Cancer Neg Hx   ? Heart failure Neg Hx   ? Hypertension Neg Hx   ? Breast cancer Neg Hx    ? ? ?Prior to Admission medications   ?Medication Sig Start Date End Date Taking? Authorizing Provider  ?albuterol (VENTOLIN HFA) 108 (90 Base) MCG/ACT inhaler Inhale 2 puffs into the lungs every 6 (six) hours as needed for wheezing or shortness of breath. 11/07/20   Shawna Clamp, MD  ?budesonide-formoterol [facility]) 160-4.5 MCG/ACT inhaler Inhale 2 puffs into the lungs 2 (two) times daily.    [provider]  ?calcium elemental as carbonate (CALCIUM ANTACID ULTRA) 400 MG chewable tablet Chew 1,000 mg by mouth 3 (three) times daily.    [provider]  ?citalopram (CELEXA) 40 MG tablet Take 40 mg by mouth daily.  09/15/14   [provider]  ?D3-50 1.25 MG (50000 UT) capsule Take by mouth once a week. 08/22/19   [provider]  ?ipratropium-albuterol (DUONEB) 0.5-2.5 (3) MG/3ML SOLN Take by nebulization 3 (three) times daily as needed. 10/08/19   [provider]  ?letrozole (FEMARA) 2.5 MG tablet TAKE 1 TABLET BY MOUTH EVERY DAY (DON'T START MED UNTIL YOU COMPLETE RADIATION 1ST) 04/25/21   Sindy Guadeloupe, MD  ?losartan-hydrochlorothiazide (HYZAAR) 50-12.5 MG per tablet Take 1 tablet by mouth daily. 09/15/14   [provider]  ?metFORMIN (GLUCOPHAGE) 1000 MG tablet Take 1,000  mg by mouth 2 (two) times daily. 09/09/14   [provider]  ?montelukast (SINGULAIR) 10 MG tablet Take 10 mg by mouth every morning.  09/14/14   [provider]  ?Donnal Debar 100-62.5-25 MCG/ACT AEPB Take 1 puff by mouth daily. 03/27/21   [provider]  ? ? ?Physical Exam: ?Vitals:  ? 07/05/21 2034 07/05/21 2041 07/05/21 2046  ?BP: 136/85    ?Pulse: (!) 108    ?Resp: (!) 22    ?Temp: 98.9 ?F (37.2 ?C)    ?TempSrc: Oral    ?SpO2: (!) 85% (!) 89% 91%  ?Weight:  112.5 kg   ?Height:  5' 0.6" (1.539 m)   ? ?Physical Exam ?Vitals and nursing note reviewed.  ?Constitutional:   ?   General: She is not in acute distress. ?   Appearance: Normal appearance. She is obese. She  is not ill-appearing, toxic-appearing or diaphoretic.  ?HENT:  ?   Head: Normocephalic and atraumatic.  ?   Right Ear: Hearing and external ear normal.  ?   Left Ear: Hearing and external ear normal.  ?   Nose: Nose normal. No nasal deformity.  ?   Mouth/Throat:  ?   Lips: Pink.  ?   Mouth: Mucous membranes are moist.  ?   Tongue: No lesions.  ?   Pharynx: Oropharynx is clear.  ?Eyes:  ?   Extraocular Movements: Extraocular movements intact.  ?   Pupils: Pupils are equal, round, and reactive to light.  ?Cardiovascular:  ?   Rate and Rhythm: Normal rate and regular rhythm.  ?   Pulses: Normal pulses.  ?   Heart sounds: Normal heart sounds.  ?Pulmonary:  ?   Effort: Pulmonary effort is normal.  ?   Breath sounds: Wheezing present.  ?Abdominal:  ?   General: Bowel sounds are normal. There is no distension.  ?   Palpations: Abdomen is soft. There is no mass.  ?   Tenderness: There is no abdominal tenderness. There is no guarding.  ?   Hernia: No hernia is present.  ?Musculoskeletal:  ?   Right lower leg: No edema.  ?   Left lower leg: No edema.  ?Skin: ?   General: Skin is warm.  ?Neurological:  ?   General: No focal deficit present.  ?   Mental Status: She is alert and oriented to person, place, and time.  ?   Cranial Nerves: Cranial nerves 2-12 are intact.  ?   Motor: Motor function is intact.  ?Psychiatric:     ?   Attention and Perception: Attention normal.     ?   Mood and Affect: Mood normal.     ?   Speech: Speech normal.     ?   Behavior: Behavior normal. Behavior is cooperative.     ?   Cognition and Memory: Cognition normal.  ? ? ?Data Reviewed: ?Results for orders placed or performed during the hospital encounter of 07/05/21 (from the past 24 hour(s))  ?Lipase, blood     Status: None  ? Collection Time: 07/05/21  8:31 PM  ?Result Value Ref Range  ? Lipase 36 11 - 51 U/L  ?Comprehensive metabolic panel     Status: Abnormal  ? Collection Time: 07/05/21  8:31 PM  ?Result Value Ref Range  ? Sodium 141 135 - 145  mmol/L  ? Potassium 3.3 (L) 3.5 - 5.1 mmol/L  ? Chloride 104 98 - 111 mmol/L  ? CO2 25 22 -  32 mmol/L  ? Glucose, Bld 147 (H) 70 - 99 mg/dL  ? BUN 16 8 - 23 mg/dL  ? Creatinine, Ser 0.93 0.44 - 1.00 mg/dL  ? Calcium 9.6 8.9 - 10.3 mg/dL  ? Total Protein 7.7 6.5 - 8.1 g/dL  ? Albumin 4.2 3.5 - 5.0 g/dL  ? AST 24 15 - 41 U/L  ? ALT 20 0 - 44 U/L  ? Alkaline Phosphatase 58 38 - 126 U/L  ? Total Bilirubin 0.7 0.3 - 1.2 mg/dL  ? GFR, Estimated >60 >60 mL/min  ? Anion gap 12 5 - 15  ?CBC     Status: Abnormal  ? Collection Time: 07/05/21  8:31 PM  ?Result Value Ref Range  ? WBC 6.5 4.0 - 10.5 K/uL  ? RBC 5.12 (H) 3.87 - 5.11 MIL/uL  ? Hemoglobin 14.2 12.0 - 15.0 g/dL  ? HCT 44.6 36.0 - 46.0 %  ? MCV 87.1 80.0 - 100.0 fL  ? MCH 27.7 26.0 - 34.0 pg  ? MCHC 31.8 30.0 - 36.0 g/dL  ? RDW 13.8 11.5 - 15.5 %  ? Platelets 217 150 - 400 K/uL  ? nRBC 0.0 0.0 - 0.2 %  ?Troponin I (High Sensitivity)     Status: None  ? Collection Time: 07/05/21  8:31 PM  ?Result Value Ref Range  ? Troponin I (High Sensitivity) 4 <18 ng/L  ? ? ? ?Assessment and Plan: ?* Acute respiratory failure with hypoxia (HCC) ?Pt was hypoxic on arrival. ?SpO2: 91 % ?O2 Flow Rate (L/min): 6 L/min ?Started on oxygen and sats came up.  ?We will cont vancomycin and cefepime.  ? ?Sepsis ([place]) ?Blood pressure 136/85, pulse (!) 108, temperature 98.9 ?F (37.2 ?C), temperature source Oral, resp. rate (!) 22, height 5' 0.6" (1.539 m), weight 112.5 kg, SpO2 91 %. ?MIVF.  ? ?HTN (hypertension) ?Blood pressure 136/85, pulse (!) 108, temperature 98.9 ?F (37.2 ?C), temperature source Oral, resp. rate (!) 22, height 5' 0.6" (1.539 m), weight 112.5 kg, SpO2 91 %. ?we will continue home regimen of hctz/ losartan and prn hydralazine.  ? ?Multifocal pneumonia ?Cont cefepime and vancomycin . ? ?Type 2 diabetes mellitus ([place]) ?Cont cozaar and hctz.  ? ? ? Advance Care Planning:  ?  Code Status: Full Code  ? ?Consults:  ?None  ? ?Family Communication:  ?Jodene Nam (Daughter)   ?[phone] [facility] Phone) ? ?Severity of Illness: ?The appropriate patient status for this patient is OBSERVATION. Observation status is judged to be reasonable and necessary in order to provide the required i

## 2021-07-05 NOTE — Progress Notes (Signed)
Pharmacy Antibiotic Note ? ?Nancy Patton is a 61 y.o. female admitted on 07/05/2021 with HCAP.  Pharmacy has been consulted for Cefepime and Vancomycin dosing x 7 days. ? ?Plan: ?Cefepime 2 gm q8h per indication & renal fxn. ? ?Pt given initial dose of Vancomycin  ?Vancomycin 1250 mg IV Q 24 hrs. Goal AUC 400-550. ?Expected AUC: 525.7 ?SCr used: 0.93, Vd used: 0.5, BMI 47.5 ? ?Pharmacy will continue to follow and will adjust abx dosing when warranted. ? ? ?Height: 5' 0.6" (153.9 cm) ?Weight: 112.5 kg (248 lb) ?IBW/kg (Calculated) : 46.88 ? ?Temp (24hrs), Avg:98.9 ?F (37.2 ?C), Min:98.9 ?F (37.2 ?C), Max:98.9 ?F (37.2 ?C) ? ?Recent Labs  ?Lab 07/05/21 ?2031  ?WBC 6.5  ?CREATININE 0.93  ?  ?Estimated Creatinine Clearance: 73.3 mL/min (by C-G formula based on SCr of 0.93 mg/dL).   ? ?No Known Allergies ? ?Antimicrobials this admission: ?5/16 Cefepime >> x 7 days ?5/16 Vancomycin >> x 7 days ? ?Microbiology results: ?5/16 BCx: Pending ?5/16 UCx: Pending  ? ?Thank you for allowing pharmacy to be a part of this patient?s care. ? ?Renda Rolls, PharmD, MBA ?07/05/2021 ?11:00 PM ? ? ?

## 2021-07-05 NOTE — Progress Notes (Signed)
CODE SEPSIS - PHARMACY COMMUNICATION ? ?**Broad Spectrum Antibiotics should be administered within 1 hour of Sepsis diagnosis** ? ?Time Code Sepsis Called/Page Received: 2210 ? ?Antibiotics Ordered: Cefepime and Vancomycin ? ?Time of 1st antibiotic administration: 2257 ? ?Renda Rolls, PharmD, MBA ?07/05/2021 ?10:11 PM ? ? ?

## 2021-07-05 NOTE — ED Triage Notes (Signed)
Sats remained 87-89% on 4L, oxygen increased to 6L while in triage.  ?

## 2021-07-06 ENCOUNTER — Inpatient Hospital Stay: Payer: Managed Care, Other (non HMO)

## 2021-07-06 ENCOUNTER — Inpatient Hospital Stay
Admit: 2021-07-06 | Discharge: 2021-07-06 | Disposition: A | Discharge status: Home / Self Care | Payer: Managed Care, Other (non HMO) | Attending: Internal Medicine | Admitting: Internal Medicine

## 2021-07-06 ENCOUNTER — Other Ambulatory Visit: Payer: Self-pay

## 2021-07-06 DIAGNOSIS — J9601 Acute respiratory failure with hypoxia: Secondary | ICD-10-CM

## 2021-07-06 DIAGNOSIS — J189 Pneumonia, unspecified organism: Secondary | ICD-10-CM

## 2021-07-06 HISTORY — DX: Acute respiratory failure with hypoxia: J96.01

## 2021-07-06 HISTORY — DX: Pneumonia, unspecified organism: J18.9

## 2021-07-06 LAB — URINALYSIS, COMPLETE (UACMP) WITH MICROSCOPIC
Bilirubin Urine: NEGATIVE
Glucose, UA: NEGATIVE mg/dL
Hgb urine dipstick: NEGATIVE
Ketones, ur: 20 mg/dL — AB
Nitrite: NEGATIVE
Protein, ur: NEGATIVE mg/dL
Specific Gravity, Urine: 1.031 — ABNORMAL HIGH (ref 1.005–1.030)
pH: 5 (ref 5.0–8.0)

## 2021-07-06 LAB — ECHOCARDIOGRAM COMPLETE
AR max vel: 2.84 cm2
AV Area VTI: 2.58 cm2
AV Area mean vel: 2.54 cm2
AV Mean grad: 4 mmHg
AV Peak grad: 6.6 mmHg
Ao pk vel: 1.28 m/s
Area-P 1/2: 2.82 cm2
Height: 60.6 in
MV VTI: 2.68 cm2
S' Lateral: 2.34 cm
Weight: 3968 oz

## 2021-07-06 LAB — GLUCOSE, CAPILLARY: Glucose-Capillary: 152 mg/dL — ABNORMAL HIGH (ref 70–99)

## 2021-07-06 LAB — CBG MONITORING, ED
Glucose-Capillary: 186 mg/dL — ABNORMAL HIGH (ref 70–99)
Glucose-Capillary: 217 mg/dL — ABNORMAL HIGH (ref 70–99)

## 2021-07-06 LAB — PROCALCITONIN: Procalcitonin: <0.1 ng/mL

## 2021-07-06 MED ORDER — LETROZOLE 2.5 MG PO TABS
2.5000 mg | ORAL_TABLET | Freq: Every day | ORAL | Status: DC
  Administered 2021-07-06: 2.5 mg via ORAL
  Filled 2021-07-06: qty 1

## 2021-07-06 MED ORDER — METHYLPREDNISOLONE 4 MG PO TBPK: ORAL_TABLET | ORAL | 0 refills | Status: DC

## 2021-07-06 MED ORDER — ONDANSETRON HCL 4 MG/2ML IJ SOLN: 4.0000 mg | Freq: Four times a day (QID) | INTRAMUSCULAR | Status: DC | PRN

## 2021-07-06 MED ORDER — ONDANSETRON HCL 4 MG PO TABS: 4.0000 mg | ORAL_TABLET | Freq: Every day | ORAL | 1 refills | Status: DC | PRN

## 2021-07-06 MED ORDER — FLUTICASONE FUROATE-VILANTEROL 100-25 MCG/ACT IN AEPB
1.0000 | INHALATION_SPRAY | Freq: Every day | RESPIRATORY_TRACT | Status: DC
Start: 2021-07-06 — End: 2021-07-06

## 2021-07-06 MED ORDER — ALBUTEROL SULFATE HFA 108 (90 BASE) MCG/ACT IN AERS: 2.0000 | INHALATION_SPRAY | Freq: Four times a day (QID) | RESPIRATORY_TRACT | 2 refills | Status: DC | PRN

## 2021-07-06 MED ORDER — IPRATROPIUM-ALBUTEROL 0.5-2.5 (3) MG/3ML IN SOLN
3.0000 mL | Freq: Four times a day (QID) | RESPIRATORY_TRACT | Status: DC
  Administered 2021-07-06: 3 mL via RESPIRATORY_TRACT
  Filled 2021-07-06: qty 3

## 2021-07-06 MED ORDER — UMECLIDINIUM BROMIDE 62.5 MCG/ACT IN AEPB
1.0000 | INHALATION_SPRAY | Freq: Every day | RESPIRATORY_TRACT | Status: DC
Start: 2021-07-06 — End: 2021-07-06

## 2021-07-06 MED ORDER — IOHEXOL 300 MG/ML  SOLN
75.0000 mL | Freq: Once | INTRAMUSCULAR | Status: AC | PRN
  Administered 2021-07-06: 75 mL via INTRAVENOUS

## 2021-07-06 NOTE — ED Notes (Signed)
Informed RN bed assigned 

## 2021-07-06 NOTE — Discharge Summary (Signed)
Physician Discharge Summary  ?Nancy Patton XBD:[id number] DOB: 09/26/1960 DOA: 07/05/2021 ? ?PCP: Perrin Maltese, MD ? ?Admit date: 07/05/2021 ?Discharge date: 07/06/2021 ? ?Admitted From: Home ?Disposition:  Home ? ?Recommendations for Outpatient Follow-up:  ?Follow up with PCP in 1-2 weeks ?Consider referral to pulmonology ? ?Home Health:No ?Equipment/Devices: None ? ?Discharge Condition:Stable  ?CODE STATUS:FULL  ?Diet recommendation: Previous ? ?Brief/Interim Summary: ?Nancy Patton is a 62 y.o. female with medical history significant of Htn, obesity, breast cancer being followed by dr. Janese Banks, osa, presenting with vomiting x 2 days and SOB. ? ?Initially required up to 6 L nasal cannula.  Was meeting sepsis criteria however negative procalcitonin, no leukocytosis, no fever.  Initial chest x-ray concerning for pneumonia and patient was started on empiric antibiotics.  Quickly weaned from 6 L to room air.  Follow-up chest CT with contrast demonstrated only right basilar atelectasis with no evidence of acute infection.  Antibiotics stopped. ? ?Symptoms improved and patient back to baseline at time of discharge.  Suspect some element of pulmonary hypertension versus obstructive sleep apnea.  Also patient has history of chronic lung disease that she describes as asthma.  She is on Trelegy prescribed by PCP.  She does not see a pulmonologist. ? ?At time of discharge we will treat as mild asthma exacerbation in the setting of suspected viral gastroenteritis.  Will recommend Medrol Dosepak, resumption of home Trelegy.  As needed ProAir inhaler.  Patient will see her primary care physician.  Suggest follow-up visit within 1 to 2 weeks.  Suggest outpatient referral to pulmonology for further evaluation of chronic lung disease.  Echocardiogram completed in house and is pending at time of discharge ? ? ? ? ?Discharge Diagnoses:  ?Principal Problem: ?  Acute respiratory failure with hypoxia ([place]) ?Active  Problems: ?  Sepsis ([place]) ?  HTN (hypertension) ?  Type 2 diabetes mellitus ([place]) ?  Multifocal pneumonia ?  CAP (community acquired pneumonia) ? ?Sepsis, ruled out ?Pneumonia, ruled out ?No evidence of infectious focus at time of discharge.  Discontinue antibiotics ? ?Acute respiratory failure with hypoxia, resolved ?Patient initially required up to 6 L nasal cannula.  Quickly weaned from 6 L to room air.  Chest CT negative for acute infection.  Suspect basilar atelectasis in setting of possible viral gastroenteritis.  Possible background of obstructive sleep apnea versus pulmonary hypertension.  Outpatient referral to pulmonology recommended. ? ?Discharge Instructions ? ?Discharge Instructions   ? ? Diet - low sodium heart healthy   Complete by: As directed ?  ? Increase activity slowly   Complete by: As directed ?  ? ?  ? ?Allergies as of 07/06/2021   ?No Known Allergies ?  ? ?  ?Medication List  ?  ? ?TAKE these medications   ? ?albuterol 108 (90 Base) MCG/ACT inhaler ?Commonly known as: VENTOLIN HFA ?Inhale 2 puffs into the lungs every 6 (six) hours as needed for wheezing or shortness of breath. ?What changed: Another medication with the same name was added. Make sure you understand how and when to take each. ?  ?albuterol 108 (90 Base) MCG/ACT inhaler ?Commonly known as: VENTOLIN HFA ?Inhale 2 puffs into the lungs every 6 (six) hours as needed for wheezing or shortness of breath. ?What changed: You were already taking a medication with the same name, and this prescription was added. Make sure you understand how and when to take each. ?  ?budesonide-formoterol 160-4.5 MCG/ACT inhaler ?Commonly known as: SYMBICORT ?Inhale 2 puffs into  the lungs 2 (two) times daily. ?  ?Calcium Antacid Ultra 400 MG chewable tablet ?Generic drug: calcium elemental as carbonate ?Chew 1,000 mg by mouth 3 (three) times daily. ?  ?citalopram 40 MG tablet ?Commonly known as: CELEXA ?Take 40 mg by mouth daily. ?  ?D3-50 1.25 MG (50000  UT) capsule ?Generic drug: Cholecalciferol ?Take by mouth once a week. ?  ?ibuprofen 800 MG tablet ?Commonly known as: ADVIL ?Take 800 mg by mouth every 8 (eight) hours as needed. ?  ?ipratropium-albuterol 0.5-2.5 (3) MG/3ML Soln ?Commonly known as: DUONEB ?Take by nebulization 3 (three) times daily as needed. ?  ?letrozole 2.5 MG tablet ?Commonly known as: [place] ?TAKE 1 TABLET BY MOUTH EVERY DAY (DON'T START MED UNTIL YOU COMPLETE RADIATION 1ST) ?  ?losartan-hydrochlorothiazide 50-12.5 MG tablet ?Commonly known as: HYZAAR ?Take 1 tablet by mouth daily. ?  ?metFORMIN 1000 MG tablet ?Commonly known as: GLUCOPHAGE ?Take 1,000 mg by mouth 2 (two) times daily. ?  ?methylPREDNISolone 4 MG Tbpk tablet ?Commonly known as: MEDROL DOSEPAK ?Medrol dosepak.  Please take as per package directions ?  ?montelukast 10 MG tablet ?Commonly known as: SINGULAIR ?Take 10 mg by mouth every morning. ?  ?ondansetron 4 MG tablet ?Commonly known as: Zofran ?Take 1 tablet (4 mg total) by mouth daily as needed for nausea or vomiting. ?  ?Trelegy Ellipta 100-62.5-25 MCG/ACT Aepb ?Generic drug: Fluticasone-Umeclidin-Vilant ?Take 1 puff by mouth daily. ?  ? ?  ? ? Follow-up Information   ? ? Perrin Maltese, MD. Daphane Shepherd on 07/14/2021.   ?Specialty: Internal Medicine ?Why: 07/14/21 at 11:00am. Inquire about referral to pulmonology ?Contact information: ?[address] ?[place] [address] ?[phone] ? ? ?  ?  ? ? Ottie Glazier, MD Follow up.   ?Specialty: Pulmonary Disease ?Why: Recommended pulmonary care physician.  Please ask your PCP about a referral ?Contact information: ?[address] ?[place] [address] ?[phone] ? ? ?  ?  ? ?  ?  ? ?  ? ?No Known Allergies ? ?Consultations: ?None ? ? ?Procedures/Studies: ?DG Chest 1 View ? ?Result Date: 07/05/2021 ?CLINICAL DATA:  Shortness of breath, hypoxia, and vomiting beginning yesterday. EXAM: CHEST  1 VIEW COMPARISON:  05/13/2021 FINDINGS: The heart size and mediastinal contours are  within normal limits. Asymmetric airspace opacity is seen in the right lower lobe, suspicious for pneumonia. No evidence of pleural effusion. IMPRESSION: Right lower lobe airspace opacity, suspicious for pneumonia. Recommend continued chest radiographic follow-up to confirm resolution. Electronically Signed   By: Marlaine Hind M.D.   On: 07/05/2021 21:08  ? ?CT CHEST W CONTRAST ? ?Result Date: 07/06/2021 ?CLINICAL DATA:  Pneumonia, complication suspected, x-ray done. EXAM: CT CHEST WITH CONTRAST TECHNIQUE: Multidetector CT imaging of the chest was performed during intravenous contrast administration. RADIATION DOSE REDUCTION: This exam was performed according to the departmental dose-optimization program which includes automated exposure control, adjustment of the mA and/or kV according to patient size and/or use of iterative reconstruction technique. CONTRAST:  78m OMNIPAQUE IOHEXOL 300 MG/ML  SOLN COMPARISON:  CT May 13, 2021 and chest radiograph from Jul 05, 2021 FINDINGS: Cardiovascular: Normal caliber thoracic aorta. No central pulmonary embolus on this nondedicated study. Normal size heart. No significant pericardial effusion/thickening. Mediastinum/Nodes: No suspicious thyroid nodule. No pathologically enlarged mediastinal, hilar or axillary lymph nodes. Scattered prominent mediastinal lymph nodes are unchanged from prior and not pathologically enlarged by size criteria. Small hiatal hernia. Lungs/Pleura: Mild diffuse bronchial wall thickening. Consolidation of the lateral segment of the right  middle lobe with volume loss and without distal air bronchograms, most consistent with atelectasis and corresponding with a region of prior inflammation/infection seen on prior chest CT. The central airways are patent without central obstructive lesion identified. Scattered multifocal areas of subsegmental atelectasis in the bilateral lower lobes and lingula. No pleural effusion. No pneumothorax. Upper Abdomen: No  acute abnormality. Musculoskeletal: Multilevel degenerative changes spine. No acute osseous abnormality. IMPRESSION: 1. Consolidation of the lateral segment of the right middle lobe with volume loss and without distal

## 2021-07-06 NOTE — Assessment & Plan Note (Signed)
Pt was hypoxic on arrival. ?SpO2: 91 % ?O2 Flow Rate (L/min): 6 L/min ?Started on oxygen and sats came up.  ?We will cont vancomycin and cefepime.  ?

## 2021-07-06 NOTE — Evaluation (Signed)
Occupational Therapy Evaluation ?Patient Details ?Name: Nancy Patton ?MRN: [id number] ?DOB: [birth year] ?Today's Date: 07/06/2021 ? ? ?History of Present Illness Gilberte S Laurine Blazer is a 61 y.o. female with medical history significant of Htn, obesity, breast cancer being followed by dr. Janese Banks, osa, presenting with vomiting x 2 days and SOB. Found to have pnemonia  ? ?Clinical Impression ?  ?Pms Patton was seen for OT evaluation this date. Pt was independent in all ADL and functional mobility, living in a 2nd story apartment with son who works full time. Pt currently demonstrates Independence for mobility and ADLs including dressing in standing. On arrival pt on 5L [place], SpO2 95% with mobility, weaned down to RA 89% with mobility, 92% at rest on RA. Pt reports fatigue following ~400 ft mobility with coughing noted. Pt educated in energy conservation strategies including pursed lip breathing, activity pacing, and falls prevention. All education complete, will sign off. Upon discharge, recommend no OT follow up, may benefit from cardiopulmonary rehab. ?   ? ?Recommendations for follow up therapy are one component of a multi-disciplinary discharge planning process, led by the attending physician.  Recommendations may be updated based on patient status, additional functional criteria and insurance authorization.  ? ?Follow Up Recommendations ? No OT follow up (cardiopulmonary rehab) ?  ?Assistance Recommended at Discharge Set up Supervision/Assistance  ?Patient can return home with the following Assistance with cooking/housework ? ?  ?Functional Status Assessment ? Patient has had a recent decline in their functional status and demonstrates the ability to make significant improvements in function in a reasonable and predictable amount of time.  ?Equipment Recommendations ? None recommended by OT  ?  ?Recommendations for Other Services   ? ? ?  ?Precautions / Restrictions Precautions ?Precautions:  None ?Restrictions ?Weight Bearing Restrictions: No  ? ?  ? ?Mobility Bed Mobility ?Overal bed mobility: Independent ?  ?  ?  ?  ?  ?  ?  ?  ? ?Transfers ?Overall transfer level: Independent ?  ?  ?  ?  ?  ?  ?  ?  ?  ?  ? ?  ?Balance Overall balance assessment: Independent ?  ?  ?  ?  ?  ?  ?  ?  ?  ?  ?  ?  ?  ?  ?  ?  ?  ?  ?   ? ?ADL either performed or assessed with clinical judgement  ? ?ADL Overall ADL's : Modified independent ?  ?  ?  ?  ?  ?  ?  ?  ?  ?  ?  ?  ?  ?  ?  ?  ?  ?  ?  ?General ADL Comments: O2 use for functional mobility. COmpletes LB standing Independently  ? ? ? ? ?Pertinent Vitals/Pain Pain Assessment ?Pain Assessment: No/denies pain  ? ? ? ?Hand Dominance   ?  ?Extremity/Trunk Assessment Upper Extremity Assessment ?Upper Extremity Assessment: Overall WFL for tasks assessed ?  ?Lower Extremity Assessment ?Lower Extremity Assessment: Overall WFL for tasks assessed ?  ?  ?  ?Communication Communication ?Communication: No difficulties ?  ?Cognition Arousal/Alertness: Awake/alert ?Behavior During Therapy: [facility] for tasks assessed/performed ?Overall Cognitive Status: Within Functional Limits for tasks assessed ?  ?  ?  ?  ?  ?  ?  ?  ?  ?  ?  ?  ?  ?  ?  ?  ?  ?  ?  ?General Comments  SpO2 89% on RA during mobility, 92% at rest, 95% on 1L IXL ? ?  ?   ?   ? ? ?Home Living Family/patient expects to be discharged to:: Private residence ?Living Arrangements: Children ?Available Help at Discharge: Family;Available PRN/intermittently ?Type of Home: Apartment ?Home Access: Stairs to enter ?Entrance Stairs-Number of Steps: 2 flights ?  ?Home Layout: One level ?  ?  ?  ?  ?  ?  ?  ?Home Equipment: None ?  ?  ?  ? ?  ?Prior Functioning/Environment Prior Level of Function : Independent/Modified Independent ?  ?  ?  ?  ?  ?  ?  ?  ?  ? ?  ?  ?OT Problem List: Decreased activity tolerance ?  ?   ?   ?OT Goals(Current goals can be found in the care plan section) Acute Rehab OT Goals ?Patient Stated Goal: to  breathe better ?OT Goal Formulation: With patient ?Time For Goal Achievement: 07/20/21 ?Potential to Achieve Goals: Good  ? ?AM-PAC OT "6 Clicks" Daily Activity     ?Outcome Measure Help from another person eating meals?: None ?Help from another person taking care of personal grooming?: None ?Help from another person toileting, which includes using toliet, bedpan, or urinal?: None ?Help from another person bathing (including washing, rinsing, drying)?: None ?Help from another person to put on and taking off regular upper body clothing?: None ?Help from another person to put on and taking off regular lower body clothing?: None ?6 Click Score: 24 ?  ?End of Session Equipment Utilized During Treatment: Oxygen ?Nurse Communication: Mobility status ? ?Activity Tolerance: Patient tolerated treatment well ?Patient left: in bed;with call bell/phone within reach ? ?OT Visit Diagnosis: Other abnormalities of gait and mobility (R26.89)  ?              ?Time: 7829-5621 ?OT Time Calculation (min): 15 min ?Charges:  OT General Charges ?$OT Visit: 1 Visit ?OT Evaluation ?$OT Eval Low Complexity: 1 Low ? ?Dessie Coma, M.S. OTR/L  ?07/06/21, 9:27 AM  ?ascom [phone] ? ?

## 2021-07-06 NOTE — Plan of Care (Signed)
?  Problem: Activity: ?Goal: Ability to tolerate increased activity will improve ?Outcome: Adequate for Discharge ?  ?Problem: Clinical Measurements: ?Goal: Ability to maintain a body temperature in the normal range will improve ?Outcome: Adequate for Discharge ?  ?Problem: Respiratory: ?Goal: Ability to maintain adequate ventilation will improve ?Outcome: Adequate for Discharge ?Goal: Ability to maintain a clear airway will improve ?Outcome: Adequate for Discharge ?  ?

## 2021-07-06 NOTE — Progress Notes (Signed)
*  PRELIMINARY RESULTS* ?Echocardiogram ?2D Echocardiogram has been performed. ? ?Nancy Patton ?07/06/2021, 1:52 PM ?

## 2021-07-06 NOTE — Assessment & Plan Note (Signed)
Blood pressure 136/85, pulse (!) 108, temperature 98.9 ?F (37.2 ?C), temperature source Oral, resp. rate (!) 22, height 5' 0.6" (1.539 m), weight 112.5 kg, SpO2 91 %. ?we will continue home regimen of hctz/ losartan and prn hydralazine.  ?

## 2021-07-06 NOTE — ED Notes (Addendum)
This RN assumed care of this patient at this time - Pt is asleep in bed; respirations equal and unlabored; pt on the monitor; O2 in place on 5L.  ? ?Pt provided a BSC. ?

## 2021-07-06 NOTE — ED Notes (Signed)
Fsbs 186 ?

## 2021-07-06 NOTE — Assessment & Plan Note (Signed)
Cont cefepime and vancomycin . ?

## 2021-07-06 NOTE — Evaluation (Signed)
Physical Therapy Evaluation ?Patient Details ?Name: Nancy Patton Morris ?MRN: [id number] ?DOB: Jan 04, 1961 ?Today's Date: 07/06/2021 ? ?History of Present Illness ? Joory S Laurine Blazer is a 61 y.o. female with medical history significant of Htn, obesity, breast cancer being followed by dr. Janese Banks, osa, presenting with vomiting x 2 days and SOB. Found to have pnemonia ? ?  ?Clinical Impression ? Pt received in Semi-Fowler's position and agreeable to therapy.  Pt is able to transfer with good mobility and without use of the bedrails or any assistive devices.  Pt then transferred to standing and is able to ambulate with CGA progressing to supervision to the therapy gym.  Pt able to ambulate up and down the stairs x4 to be equivalent to the amount of stairs she would need to manage at apartment complex.  Pt able to perform well with one standing rest break in which she normally would take after one flight.  Pt's SpO2 remained >91% throughout on room air.  Nursing notified of pt being left on room air at 96% supine in bed.  Patient is at baseline, all education completed, and time is given to address all questions/concerns. No additional skilled PT services needed at this time, PT signing off. PT recommends daily ambulation ad lib or with nursing staff as needed to prevent deconditioning.  ? ? ?   ? ?Recommendations for follow up therapy are one component of a multi-disciplinary discharge planning process, led by the attending physician.  Recommendations may be updated based on patient status, additional functional criteria and insurance authorization. ? ?Follow Up Recommendations No PT follow up ? ?  ?Assistance Recommended at Discharge None  ?Patient can return home with the following ?   ? ?  ?Equipment Recommendations None recommended by PT  ?Recommendations for Other Services ?    ?  ?Functional Status Assessment Patient has had a recent decline in their functional status and demonstrates the ability to make  significant improvements in function in a reasonable and predictable amount of time.  ? ?  ?Precautions / Restrictions Precautions ?Precautions: None ?Restrictions ?Weight Bearing Restrictions: No  ? ?  ? ?Mobility ? Bed Mobility ?Overal bed mobility: Independent ?  ?  ?  ?  ?  ?  ?  ?  ? ?Transfers ?Overall transfer level: Independent ?  ?  ?  ?  ?  ?  ?  ?  ?  ?  ? ?Ambulation/Gait ?Ambulation/Gait assistance: Supervision ?Gait Distance (Feet): 160 Feet ?Assistive device: None ?Gait Pattern/deviations: WFL(Within Functional Limits) ?  ?  ?  ?  ? ?Stairs ?Stairs: Yes ?Stairs assistance: Modified independent (Device/Increase time) ?  ?Number of Stairs: 16 ?General stair comments: pt took standing rest break on platform during middle of performing as she would do at home if she needs to. ? ?Wheelchair Mobility ?  ? ?Modified Rankin (Stroke Patients Only) ?  ? ?  ? ?Balance Overall balance assessment: Independent ?  ?  ?  ?  ?  ?  ?  ?  ?  ?  ?  ?  ?  ?  ?  ?  ?  ?  ?   ? ? ? ?Pertinent Vitals/Pain Pain Assessment ?Pain Assessment: No/denies pain  ? ? ?Home Living Family/patient expects to be discharged to:: Private residence ?Living Arrangements: Children ?Available Help at Discharge: Family;Available PRN/intermittently ?Type of Home: Apartment ?Home Access: Stairs to enter ?  ?Entrance Stairs-Number of Steps: 2 flights ?  ?Home Layout: One level ?  Home Equipment: None ?   ?  ?Prior Function Prior Level of Function : Independent/Modified Independent ?  ?  ?  ?  ?  ?  ?  ?  ?  ? ? ?Hand Dominance  ?   ? ?  ?Extremity/Trunk Assessment  ? Upper Extremity Assessment ?Upper Extremity Assessment: Overall WFL for tasks assessed ?  ? ?Lower Extremity Assessment ?Lower Extremity Assessment: Overall WFL for tasks assessed ?  ? ?   ?Communication  ? Communication: No difficulties  ?Cognition Arousal/Alertness: Awake/alert ?Behavior During Therapy: [facility] for tasks assessed/performed ?Overall Cognitive Status: Within Functional  Limits for tasks assessed ?  ?  ?  ?  ?  ?  ?  ?  ?  ?  ?  ?  ?  ?  ?  ?  ?  ?  ?  ? ?  ?General Comments General comments (skin integrity, edema, etc.): SpO2 91% with ambulation on RA, 96% at rest. ? ?  ?Exercises    ? ?Assessment/Plan  ?  ?PT Assessment Patient does not need any further PT services  ?PT Problem List   ? ?   ?  ?PT Treatment Interventions Therapeutic activities   ? ?PT Goals (Current goals can be found in the Care Plan section)  ?Acute Rehab PT Goals ?Patient Stated Goal: to go home. ?PT Goal Formulation: With patient ?Time For Goal Achievement: 07/20/21 ?Potential to Achieve Goals: Good ? ?  ?Frequency   ?  ? ? ?Co-evaluation   ?  ?  ?  ?  ? ? ?  ?AM-PAC PT "6 Clicks" Mobility  ?Outcome Measure Help needed turning from your back to your side while in a flat bed without using bedrails?: None ?Help needed moving from lying on your back to sitting on the side of a flat bed without using bedrails?: None ?Help needed moving to and from a bed to a chair (including a wheelchair)?: None ?Help needed standing up from a chair using your arms (e.g., wheelchair or bedside chair)?: None ?Help needed to walk in hospital room?: None ?Help needed climbing 3-5 steps with a railing? : None ?6 Click Score: 24 ? ?  ?End of Session Equipment Utilized During Treatment: Gait belt ?Activity Tolerance: Patient tolerated treatment well ?Patient left: in bed;with call bell/phone within reach ?Nurse Communication: Mobility status ?  ?  ? ?Time: 5009-3818 ?PT Time Calculation (min) (ACUTE ONLY): 13 min ? ? ?Charges:   PT Evaluation ?$PT Eval Low Complexity: 1 Low ?PT Treatments ?$Gait Training: 8-22 mins ?  ?   ? ? ?Gwenlyn Saran, PT, DPT ?07/06/21, 1:34 PM ? ? ?Christie Nottingham ?07/06/2021, 1:29 PM ? ?

## 2021-07-06 NOTE — TOC CM/SW Note (Signed)
Patient has orders to discharge home today. Chart reviewed. PCP is Lamonte Sakai, MD. On room air. No wounds. PT/OT evaluated her and did not recommend any follow up or DME. No TOC needs identified. CSW signing off. ? ?Dayton Scrape, [place] ?[phone] ? ?

## 2021-07-06 NOTE — Assessment & Plan Note (Signed)
Blood pressure 136/85, pulse (!) 108, temperature 98.9 ?F (37.2 ?C), temperature source Oral, resp. rate (!) 22, height 5' 0.6" (1.539 m), weight 112.5 kg, SpO2 91 %. ?MIVF.  ?

## 2021-07-06 NOTE — Progress Notes (Signed)
Sepsis tracking by eLINK 

## 2021-07-06 NOTE — Assessment & Plan Note (Signed)
Cont cozaar and hctz.  ?

## 2021-07-07 LAB — URINE CULTURE: Culture: NO GROWTH

## 2021-07-10 LAB — CULTURE, BLOOD (ROUTINE X 2)
Culture: NO GROWTH
Culture: NO GROWTH

## 2021-08-28 ENCOUNTER — Telehealth: Payer: Managed Care, Other (non HMO) | Admitting: Emergency Medicine

## 2021-08-28 DIAGNOSIS — R42 Dizziness and giddiness: Secondary | ICD-10-CM

## 2021-08-28 NOTE — Progress Notes (Signed)
Because you feel you are having side effects from your breast cancer treatment medication, I feel your condition warrants further evaluation and I recommend that you contact your oncology practice to be seen. Many offices offer virtual options to be seen via video if you are not comfortable going in person to a medical facility at this time. I believe the oncology service has a symptom management clinic you can use for same day video or in person visits Monday through Friday.   If you feel you are not having side effects from the letrozole and instead believe you are ill right now, I advise you to be seen today at an urgent care or in the ER.    NOTE: There will be NO CHARGE for this eVisit   If you are having a true medical emergency please call 911.      For an urgent face to face visit, [place] has seven urgent care centers for your convenience:     [facility] [place] [facility] [place] Get Driving Directions [phone] [address] [place], [place] [address]    [facility]) Get Driving Directions [phone] [place], [place] [address]  [facility] [place] [facility]) Get Driving Directions [phone] [address] [place],  [place]  [address]  [facility] at [address] Get Driving Directions  [phone] [address] [place],  [place] [address]   [facility] Get Driving Directions [phone] [address] [place], [place], [place] [address]   [facility] Get Driving Directions  [phone] [address] [place], [place] [address]   [facility] at  Get Driving Directions [phone] [address] [place], [place] [address]    Your e-visit answers were reviewed by a board certified  advanced clinical practitioner to complete your personal care plan.  Thank you for using e-Visits.

## 2021-08-29 ENCOUNTER — Emergency Department
Admission: EM | Admit: 2021-08-29 | Discharge: 2021-08-29 | Disposition: A | Discharge status: Home / Self Care | Payer: BC Managed Care – PPO | Attending: Emergency Medicine | Admitting: Emergency Medicine

## 2021-08-29 ENCOUNTER — Other Ambulatory Visit: Payer: Self-pay

## 2021-08-29 ENCOUNTER — Emergency Department: Payer: BC Managed Care – PPO

## 2021-08-29 DIAGNOSIS — R0600 Dyspnea, unspecified: Secondary | ICD-10-CM

## 2021-08-29 DIAGNOSIS — E119 Type 2 diabetes mellitus without complications: Secondary | ICD-10-CM | POA: Diagnosis not present

## 2021-08-29 DIAGNOSIS — I10 Essential (primary) hypertension: Secondary | ICD-10-CM | POA: Insufficient documentation

## 2021-08-29 DIAGNOSIS — R0602 Shortness of breath: Secondary | ICD-10-CM | POA: Diagnosis present

## 2021-08-29 DIAGNOSIS — J45901 Unspecified asthma with (acute) exacerbation: Secondary | ICD-10-CM

## 2021-08-29 LAB — CBC WITH DIFFERENTIAL/PLATELET
Abs Immature Granulocytes: 0.01 10*3/uL (ref 0.00–0.07)
Basophils Absolute: 0 10*3/uL (ref 0.0–0.1)
Basophils Relative: 1 %
Eosinophils Absolute: 1.3 10*3/uL — ABNORMAL HIGH (ref 0.0–0.5)
Eosinophils Relative: 24 %
HCT: 40.1 % (ref 36.0–46.0)
Hemoglobin: 12.5 g/dL (ref 12.0–15.0)
Immature Granulocytes: 0 %
Lymphocytes Relative: 30 %
Lymphs Abs: 1.6 10*3/uL (ref 0.7–4.0)
MCH: 27.9 pg (ref 26.0–34.0)
MCHC: 31.2 g/dL (ref 30.0–36.0)
MCV: 89.5 fL (ref 80.0–100.0)
Monocytes Absolute: 0.6 10*3/uL (ref 0.1–1.0)
Monocytes Relative: 11 %
Neutro Abs: 2 10*3/uL (ref 1.7–7.7)
Neutrophils Relative %: 34 %
Platelets: 216 10*3/uL (ref 150–400)
RBC: 4.48 MIL/uL (ref 3.87–5.11)
RDW: 13.5 % (ref 11.5–15.5)
WBC: 5.5 10*3/uL (ref 4.0–10.5)
nRBC: 0 % (ref 0.0–0.2)

## 2021-08-29 LAB — COMPREHENSIVE METABOLIC PANEL
ALT: 15 U/L (ref 0–44)
AST: 19 U/L (ref 15–41)
Albumin: 3.9 g/dL (ref 3.5–5.0)
Alkaline Phosphatase: 50 U/L (ref 38–126)
Anion gap: 6 (ref 5–15)
BUN: 23 mg/dL (ref 8–23)
CO2: 25 mmol/L (ref 22–32)
Calcium: 9.1 mg/dL (ref 8.9–10.3)
Chloride: 112 mmol/L — ABNORMAL HIGH (ref 98–111)
Creatinine, Ser: 0.98 mg/dL (ref 0.44–1.00)
GFR, Estimated: >60 mL/min (ref >60)
Glucose, Bld: 131 mg/dL — ABNORMAL HIGH (ref 70–99)
Potassium: 3.8 mmol/L (ref 3.5–5.1)
Sodium: 143 mmol/L (ref 135–145)
Total Bilirubin: 0.5 mg/dL (ref 0.3–1.2)
Total Protein: 6.7 g/dL (ref 6.5–8.1)

## 2021-08-29 LAB — TROPONIN I (HIGH SENSITIVITY): Troponin I (High Sensitivity): 4 ng/L (ref <18)

## 2021-08-29 LAB — BRAIN NATRIURETIC PEPTIDE: B Natriuretic Peptide: 18.6 pg/mL (ref 0.0–100.0)

## 2021-08-29 MED ORDER — PREDNISONE 10 MG PO TABS: 10.0000 mg | ORAL_TABLET | Freq: Every day | ORAL | 0 refills | Status: DC

## 2021-08-29 MED ORDER — IPRATROPIUM-ALBUTEROL 0.5-2.5 (3) MG/3ML IN SOLN
RESPIRATORY_TRACT | Status: AC
  Administered 2021-08-29: 3 mL via RESPIRATORY_TRACT
  Filled 2021-08-29: qty 9

## 2021-08-29 MED ORDER — IPRATROPIUM-ALBUTEROL 0.5-2.5 (3) MG/3ML IN SOLN
3.0000 mL | Freq: Once | RESPIRATORY_TRACT | Status: AC
Start: 2021-08-29 — End: 2021-08-29
  Administered 2021-08-29: 3 mL via RESPIRATORY_TRACT
  Filled 2021-08-29: qty 3

## 2021-08-29 MED ORDER — IPRATROPIUM-ALBUTEROL 0.5-2.5 (3) MG/3ML IN SOLN
3.0000 mL | Freq: Once | RESPIRATORY_TRACT | Status: AC
Start: 2021-08-29 — End: 2021-08-29
  Administered 2021-08-29: 3 mL via RESPIRATORY_TRACT

## 2021-08-29 MED ORDER — METHYLPREDNISOLONE SODIUM SUCC 125 MG IJ SOLR
125.0000 mg | Freq: Once | INTRAMUSCULAR | Status: AC
  Administered 2021-08-29: 125 mg via INTRAVENOUS
  Filled 2021-08-29: qty 2

## 2021-08-29 MED ORDER — IPRATROPIUM-ALBUTEROL 0.5-2.5 (3) MG/3ML IN SOLN
3.0000 mL | Freq: Once | RESPIRATORY_TRACT | Status: AC
  Administered 2021-08-29: 3 mL via RESPIRATORY_TRACT

## 2021-08-29 MED ORDER — AZITHROMYCIN 250 MG PO TABS: ORAL_TABLET | ORAL | 0 refills | Status: AC

## 2021-08-29 NOTE — ED Provider Notes (Signed)
[facility] Provider Note    Event Date/Time   First MD Initiated Contact with Patient 08/29/21 [phone]     (approximate)  History   Chief Complaint: Shortness of Breath  HPI  Nancy Patton is a 61 y.o. female with a past medical history of asthma, diabetes, hypertension, presents to the emergency department for shortness of breath.  According to the patient over the past 3 to 4 days she has had progressively worsening shortness of breath and wheezing.  Patient has been using her home medications without relief so she came to the emergency department.  Upon arrival patient is satting 88% on room air.  Patient has diffuse wheeze.  Patient states she has been coughing but states that is fairly chronic.  Occasional sputum production.  No fever.  No chest pain.  Physical Exam   Triage Vital Signs: ED Triage Vitals [08/29/21 0655]  Enc Vitals Group     BP (!) 129/55     Pulse Rate 87     Resp (!) 24     Temp      Temp src      SpO2 (!) 88 %     Weight 240 lb (108.9 kg)     Height '5\' 6"'$  (1.676 m)     Head Circumference      Peak Flow      Pain Score      Pain Loc      Pain Edu?      Excl. in [place]?     Most recent vital signs: Vitals:   08/29/21 0655  BP: (!) 129/55  Pulse: 87  Resp: (!) 24  SpO2: (!) 88%    General: Awake, no distress.  CV:  Good peripheral perfusion.  Regular rate and rhythm  Resp:  Mild tachypnea with moderate expiratory wheeze bilaterally. Abd:  No distention.  Soft, nontender.  No rebound or guarding.  ED Results / Procedures / Treatments   EKG  EKG viewed and interpreted by myself shows a normal sinus rhythm 82 bpm with a narrow QRS, normal axis, normal intervals, no concerning ST changes.  RADIOLOGY  I have viewed and interpreted the chest x-ray images.  Patient does appear to have a slight haziness bilaterally but no obvious or significant consolidation. Chest x-ray read as chronic airway thickening by  radiology with no acute finding.   MEDICATIONS ORDERED IN ED: Medications  ipratropium-albuterol (DUONEB) 0.5-2.5 (3) MG/3ML nebulizer solution 3 mL (has no administration in time range)  ipratropium-albuterol (DUONEB) 0.5-2.5 (3) MG/3ML nebulizer solution 3 mL (3 mLs Nebulization Given 08/29/21 0705)  ipratropium-albuterol (DUONEB) 0.5-2.5 (3) MG/3ML nebulizer solution 3 mL (3 mLs Nebulization Given 08/29/21 0657)  ipratropium-albuterol (DUONEB) 0.5-2.5 (3) MG/3ML nebulizer solution 3 mL (3 mLs Nebulization Given 08/29/21 0657)  methylPREDNISolone sodium succinate (SOLU-MEDROL) 125 mg/2 mL injection 125 mg (125 mg Intravenous Given 08/29/21 0704)     IMPRESSION / MDM / ASSESSMENT AND PLAN / ED COURSE  I reviewed the triage vital signs and the nursing notes.  Patient's presentation is most consistent with acute presentation with potential threat to life or bodily function.  Patient presents emergency department for shortness of breath progressing over the last 3 to 4 days.  Patient is 88% on room air with no baseline O2 requirement.  Moderate wheezes.  We will dose DuoNebs, Solu-Medrol and check labs chest x-ray and EKG.  Differential would include asthma exacerbation, pneumonia, bronchitis less likely ACS.  After treatment patient sounds  much improved very slight end expiratory wheeze however still satting around 90% on room air with no baseline O2 requirement.  We will dose an additional DuoNeb and then monitor in the emergency department.  If the patient's O2 saturation improves patient could be potentially discharged home with a course of prednisone otherwise patient would require admission to the hospital.  Patient states she is feeling much better.  Continues to maintain an O2 saturation in the mid to upper 90s on room air currently 96% throughout my evaluation.  We will discharge on prednisone, we will also cover with Zithromax for possible bronchitis.  We will have the patient follow-up  with pulmonology.  Patient agreeable to plan of care per  FINAL CLINICAL IMPRESSION(S) / ED DIAGNOSES   Dyspnea Asthma exacerbation   Note:  This document was prepared using Dragon voice recognition software and may include unintentional dictation errors.   Harvest Dark, MD 08/29/21 [phone]

## 2021-08-29 NOTE — ED Provider Triage Note (Signed)
Emergency Medicine Provider Triage Evaluation Note  Nancy Patton , a 61 y.o. female  was evaluated in triage.  Pt complains of SOB and wheezing.  Hx of asthma.  Review of Systems  Positive: Dyspnea, wheezing  Negative: Chest pain  Physical Exam  BP (!) 129/55   Pulse 87   Resp (!) 24   Ht 1.676 m ('5\' 6"'$ )   Wt 108.9 kg   SpO2 (!) 88%   BMI 38.74 kg/m  Gen:   Awake, moderate distress  Resp:  Tachypnea, audible wheezing, increased work of breathing MSK:   Moves extremities without difficulty  Other:  awake, alert, mentating well, not severe distress.  Medical Decision Making  Medically screening exam initiated at 6:53 AM.  Appropriate orders placed.  Nancy Patton was informed that the remainder of the evaluation will be completed by another provider, this initial triage assessment does not replace that evaluation, and the importance of remaining in the ED until their evaluation is complete.     Hinda Kehr, MD 08/29/21 [phone]

## 2021-08-29 NOTE — ED Notes (Signed)
D/C, new RX, and reasons to return to ED discussed with pt, pt verbalized understanding. NAD noted on D/C. Pt ambulatory on D/C with steady gait.

## 2021-08-29 NOTE — ED Triage Notes (Signed)
Pt states history of asthma. Pt states she has progressively been getting worse over last several days. Pt with audible wheezing.

## 2021-09-01 IMAGING — MG DIGITAL DIAGNOSTIC BILAT W/ TOMO W/ CAD
8 series · 8 of 24 positions shown · non-contrast
Comparison: Previous exam(s).
COMPARISON: Previous exam(s).

Addendum:
CLINICAL DATA: Screening recall for right breast mass and possible
left breast distortion.

EXAM:
DIGITAL DIAGNOSTIC BILATERAL MAMMOGRAM WITH CAD AND TOMO
RIGHT BREAST ULTRASOUND

[R MLO synth-2D]
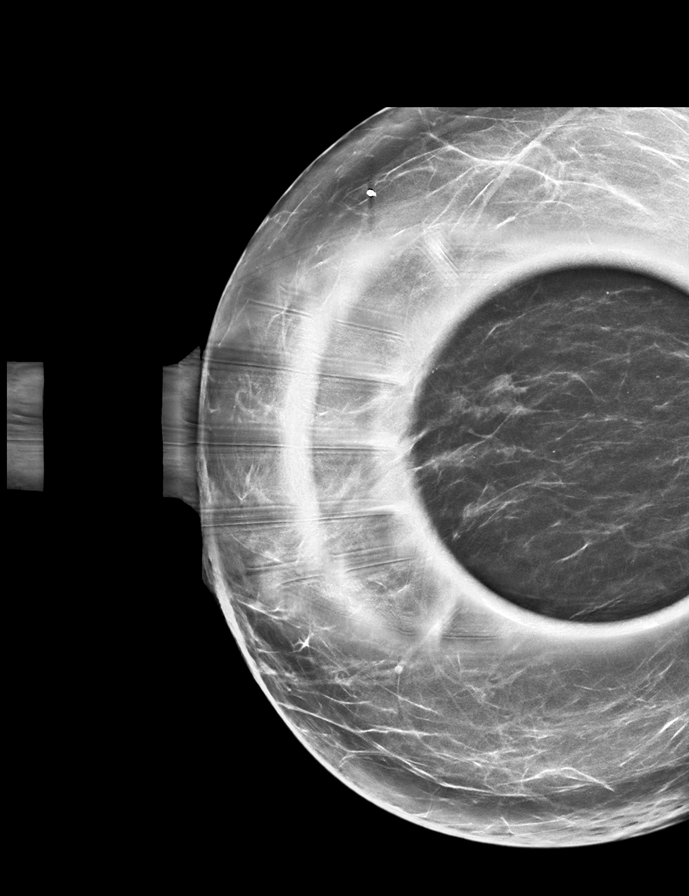

[R CC synth-2D]
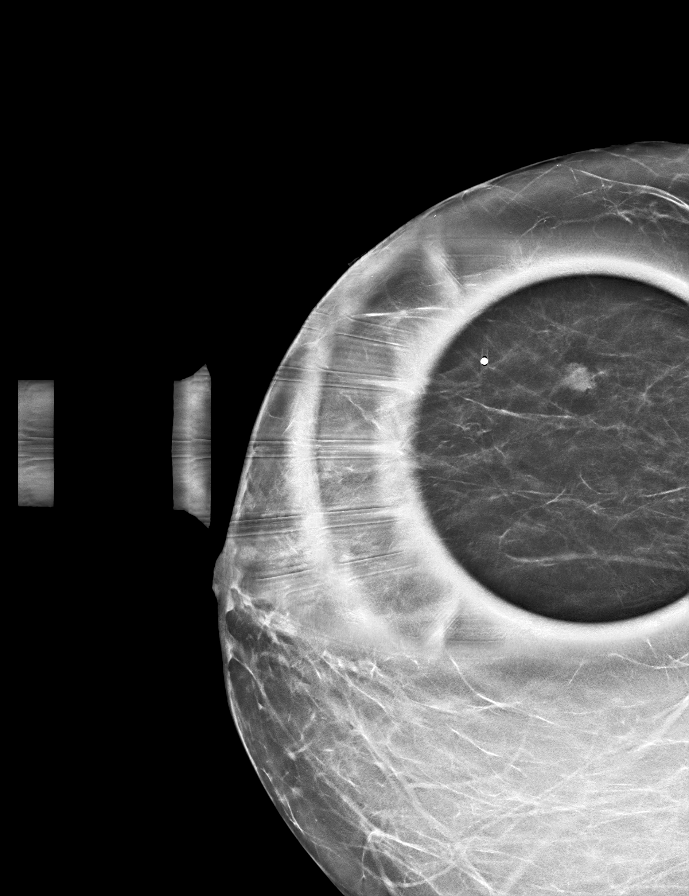

[L ML synth-2D]
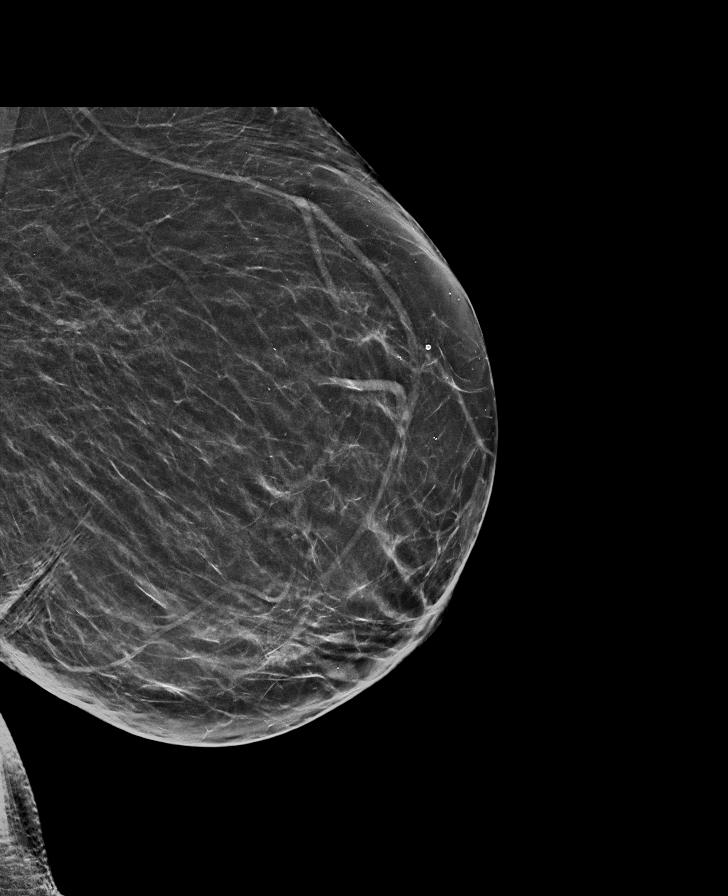

[L MLO synth-2D]
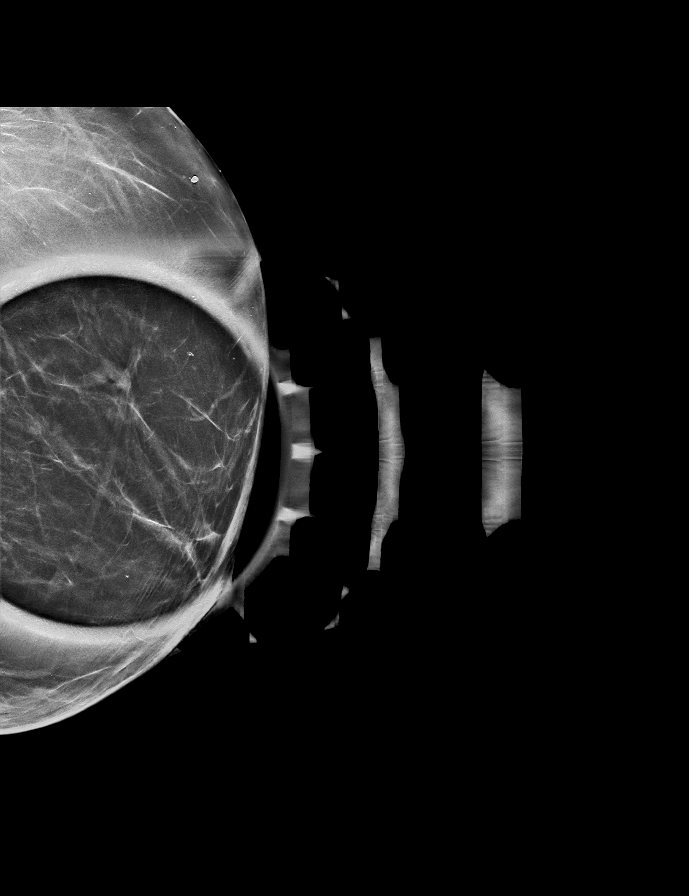

[R CC tomo · tomo slice 26/51.0]
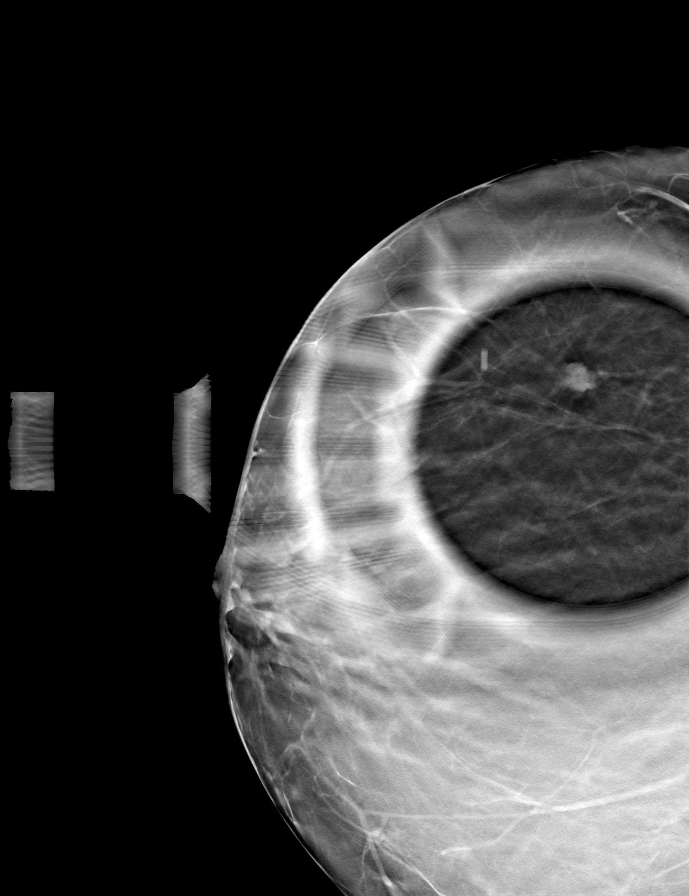

[R MLO tomo · tomo slice 33/64.0]
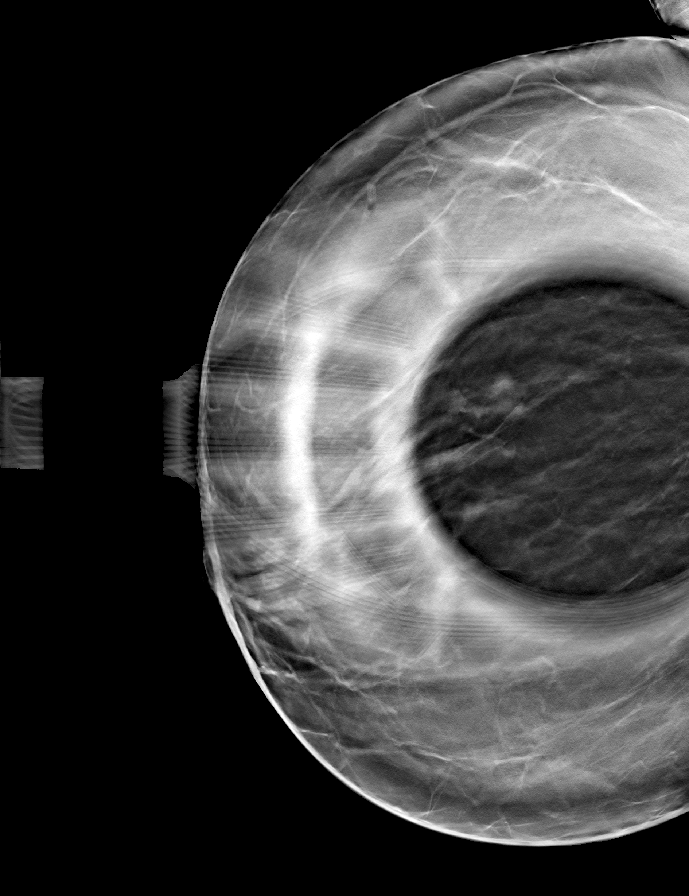

[L MLO tomo · tomo slice 29/56.0]
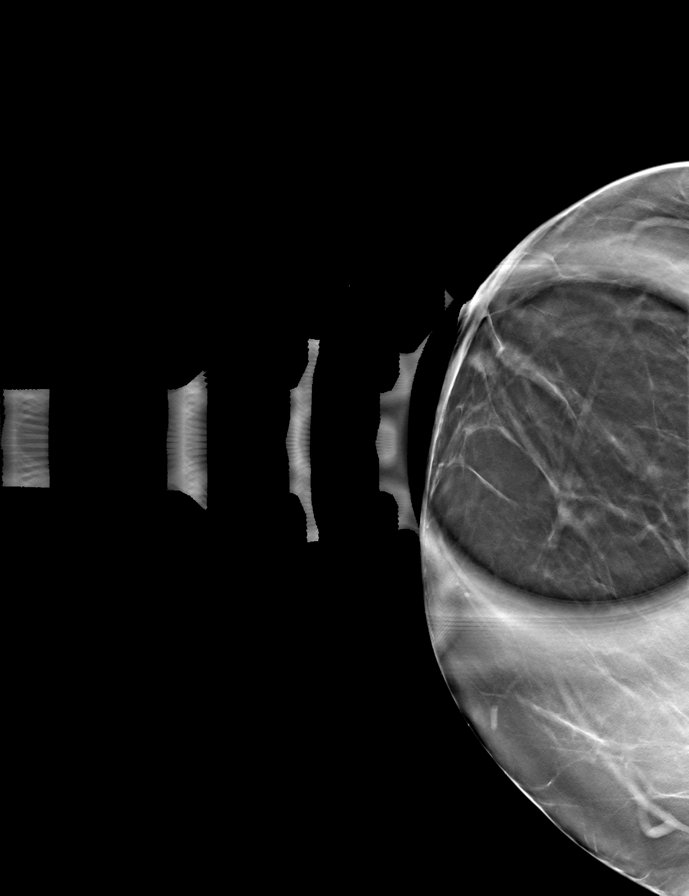

[L ML tomo · tomo slice 35/68.0]
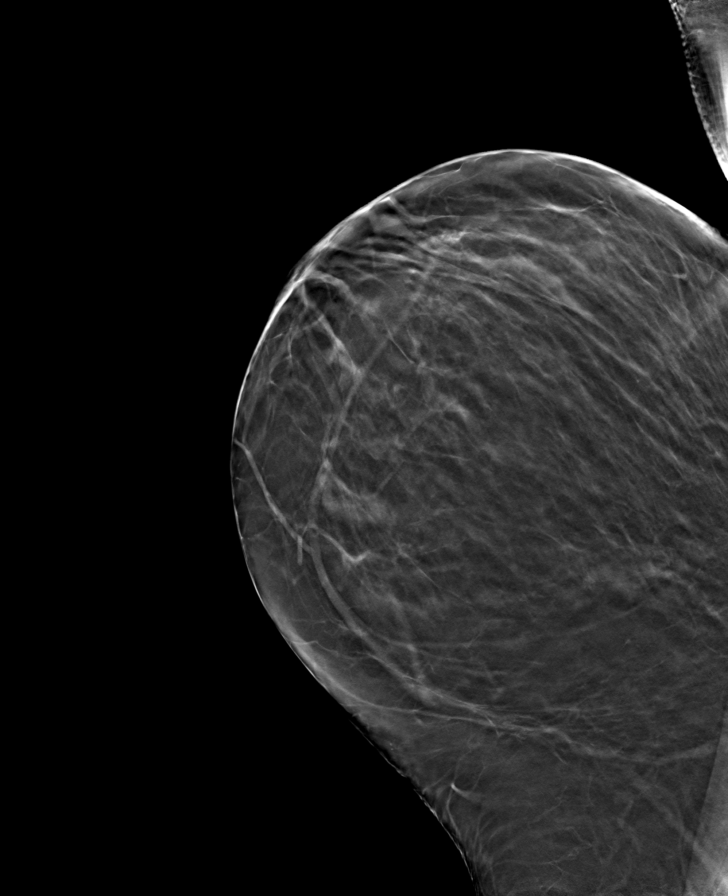

[8 of 24 positions shown; findings below may reference images not displayed]

ACR Breast Density Category b: There are scattered areas of
fibroglandular density.
FINDINGS: Cc and MLO tomograms were performed of the right breast. There is an
irregular spiculated mass within the outer right breast measuring
approximately 0.8 cm.

Mammographic images were processed with CAD.

Targeted ultrasound of the outer right breast was performed. There
is an irregular hypoechoic mass at the 9 o'clock position 6 cm from
nipple measuring 0.6 x 0.6 x 1 cm. A nearby duct connects with the
mass.

No lymphadenopathy seen in the right axilla.
IMPRESSION: Suspicious mass in the right breast at the 9 o'clock position.

RECOMMENDATION:
Ultrasound-guided biopsy of the mass in the right breast at the 9
o'clock position is recommended. This will be scheduled for the
patient.

I have discussed the findings and recommendations with the patient.
If applicable, a reminder letter will be sent to the patient
regarding the next appointment.

BI-RADS CATEGORY  5: Highly suggestive of malignancy.

ADDENDUM:
An addendum is made due to an omission regarding the left breast in
the findings portion of this report. This should read as follows:

FINDINGS (for the left breast): Additional tomograms, including spot
compression MLO and full view ML tomograms) were performed of the
left breast. The initially questioned possible left breast
distortion resolves on the additional imaging with findings
compatible with an area of overlapping fibroglandular tissue. There
is no mammographic evidence of malignancy in the left breast.

The remainder of the report is correct.

*** End of Addendum ***
ACR Breast Density Category b: There are scattered areas of
fibroglandular density.
FINDINGS: Cc and MLO tomograms were performed of the right breast. There is an
irregular spiculated mass within the outer right breast measuring
approximately 0.8 cm.

Mammographic images were processed with CAD.

Targeted ultrasound of the outer right breast was performed. There
is an irregular hypoechoic mass at the 9 o'clock position 6 cm from
nipple measuring 0.6 x 0.6 x 1 cm. A nearby duct connects with the
mass.

No lymphadenopathy seen in the right axilla.
IMPRESSION: Suspicious mass in the right breast at the 9 o'clock position.

RECOMMENDATION:
Ultrasound-guided biopsy of the mass in the right breast at the 9
o'clock position is recommended. This will be scheduled for the
patient.

I have discussed the findings and recommendations with the patient.
If applicable, a reminder letter will be sent to the patient
regarding the next appointment.

BI-RADS CATEGORY  5: Highly suggestive of malignancy.

## 2021-10-13 ENCOUNTER — Other Ambulatory Visit: Payer: Self-pay

## 2021-10-13 ENCOUNTER — Emergency Department: Payer: BC Managed Care – PPO

## 2021-10-13 ENCOUNTER — Inpatient Hospital Stay
Admission: EM | Admit: 2021-10-13 | Discharge: 2021-10-16 | DRG: 202 | Disposition: A | Discharge status: Home / Self Care | Payer: BC Managed Care – PPO | Attending: Osteopathic Medicine | Admitting: Osteopathic Medicine

## 2021-10-13 DIAGNOSIS — Z823 Family history of stroke: Secondary | ICD-10-CM

## 2021-10-13 DIAGNOSIS — K2100 Gastro-esophageal reflux disease with esophagitis, without bleeding: Secondary | ICD-10-CM | POA: Diagnosis present

## 2021-10-13 DIAGNOSIS — I272 Pulmonary hypertension, unspecified: Secondary | ICD-10-CM | POA: Diagnosis present

## 2021-10-13 DIAGNOSIS — J9601 Acute respiratory failure with hypoxia: Secondary | ICD-10-CM | POA: Diagnosis present

## 2021-10-13 DIAGNOSIS — Z87891 Personal history of nicotine dependence: Secondary | ICD-10-CM | POA: Diagnosis not present

## 2021-10-13 DIAGNOSIS — Z20822 Contact with and (suspected) exposure to covid-19: Secondary | ICD-10-CM | POA: Diagnosis present

## 2021-10-13 DIAGNOSIS — K219 Gastro-esophageal reflux disease without esophagitis: Secondary | ICD-10-CM | POA: Diagnosis not present

## 2021-10-13 DIAGNOSIS — E1169 Type 2 diabetes mellitus with other specified complication: Secondary | ICD-10-CM | POA: Diagnosis not present

## 2021-10-13 DIAGNOSIS — J45901 Unspecified asthma with (acute) exacerbation: Secondary | ICD-10-CM

## 2021-10-13 DIAGNOSIS — Z833 Family history of diabetes mellitus: Secondary | ICD-10-CM

## 2021-10-13 DIAGNOSIS — J690 Pneumonitis due to inhalation of food and vomit: Secondary | ICD-10-CM | POA: Diagnosis present

## 2021-10-13 DIAGNOSIS — E119 Type 2 diabetes mellitus without complications: Secondary | ICD-10-CM | POA: Diagnosis present

## 2021-10-13 DIAGNOSIS — E669 Obesity, unspecified: Secondary | ICD-10-CM | POA: Diagnosis present

## 2021-10-13 DIAGNOSIS — E876 Hypokalemia: Secondary | ICD-10-CM | POA: Diagnosis present

## 2021-10-13 DIAGNOSIS — Z79899 Other long term (current) drug therapy: Secondary | ICD-10-CM | POA: Diagnosis not present

## 2021-10-13 DIAGNOSIS — I10 Essential (primary) hypertension: Secondary | ICD-10-CM | POA: Diagnosis present

## 2021-10-13 DIAGNOSIS — Z853 Personal history of malignant neoplasm of breast: Secondary | ICD-10-CM

## 2021-10-13 DIAGNOSIS — Z923 Personal history of irradiation: Secondary | ICD-10-CM

## 2021-10-13 DIAGNOSIS — J40 Bronchitis, not specified as acute or chronic: Secondary | ICD-10-CM | POA: Diagnosis present

## 2021-10-13 DIAGNOSIS — F32A Depression, unspecified: Secondary | ICD-10-CM | POA: Diagnosis present

## 2021-10-13 DIAGNOSIS — J4541 Moderate persistent asthma with (acute) exacerbation: Secondary | ICD-10-CM | POA: Diagnosis not present

## 2021-10-13 DIAGNOSIS — R0602 Shortness of breath: Secondary | ICD-10-CM | POA: Diagnosis present

## 2021-10-13 DIAGNOSIS — R0902 Hypoxemia: Principal | ICD-10-CM

## 2021-10-13 DIAGNOSIS — J9621 Acute and chronic respiratory failure with hypoxia: Secondary | ICD-10-CM | POA: Diagnosis present

## 2021-10-13 DIAGNOSIS — G4733 Obstructive sleep apnea (adult) (pediatric): Secondary | ICD-10-CM | POA: Diagnosis present

## 2021-10-13 HISTORY — DX: Unspecified asthma with (acute) exacerbation: J45.901

## 2021-10-13 LAB — COMPREHENSIVE METABOLIC PANEL
ALT: 18 U/L (ref 0–44)
AST: 29 U/L (ref 15–41)
Albumin: 4.5 g/dL (ref 3.5–5.0)
Alkaline Phosphatase: 53 U/L (ref 38–126)
Anion gap: 10 (ref 5–15)
BUN: 18 mg/dL (ref 8–23)
CO2: 24 mmol/L (ref 22–32)
Calcium: 9.4 mg/dL (ref 8.9–10.3)
Chloride: 105 mmol/L (ref 98–111)
Creatinine, Ser: 0.88 mg/dL (ref 0.44–1.00)
GFR, Estimated: >60 mL/min (ref >60)
Glucose, Bld: 83 mg/dL (ref 70–99)
Potassium: 3.3 mmol/L — ABNORMAL LOW (ref 3.5–5.1)
Sodium: 139 mmol/L (ref 135–145)
Total Bilirubin: 0.9 mg/dL (ref 0.3–1.2)
Total Protein: 7.3 g/dL (ref 6.5–8.1)

## 2021-10-13 LAB — D-DIMER, QUANTITATIVE: D-Dimer, Quant: 0.56 ug/mL-FEU — ABNORMAL HIGH (ref 0.00–0.50)

## 2021-10-13 LAB — CBC WITH DIFFERENTIAL/PLATELET
Abs Immature Granulocytes: 0.02 10*3/uL (ref 0.00–0.07)
Basophils Absolute: 0 10*3/uL (ref 0.0–0.1)
Basophils Relative: 1 %
Eosinophils Absolute: 1.1 10*3/uL — ABNORMAL HIGH (ref 0.0–0.5)
Eosinophils Relative: 18 %
HCT: 43.1 % (ref 36.0–46.0)
Hemoglobin: 14.1 g/dL (ref 12.0–15.0)
Immature Granulocytes: 0 %
Lymphocytes Relative: 28 %
Lymphs Abs: 1.8 10*3/uL (ref 0.7–4.0)
MCH: 28.2 pg (ref 26.0–34.0)
MCHC: 32.7 g/dL (ref 30.0–36.0)
MCV: 86.2 fL (ref 80.0–100.0)
Monocytes Absolute: 0.7 10*3/uL (ref 0.1–1.0)
Monocytes Relative: 11 %
Neutro Abs: 2.8 10*3/uL (ref 1.7–7.7)
Neutrophils Relative %: 42 %
Platelets: 248 10*3/uL (ref 150–400)
RBC: 5 MIL/uL (ref 3.87–5.11)
RDW: 13.1 % (ref 11.5–15.5)
WBC: 6.5 10*3/uL (ref 4.0–10.5)
nRBC: 0 % (ref 0.0–0.2)

## 2021-10-13 LAB — RESP PANEL BY RT-PCR (FLU A&B, COVID) ARPGX2
Influenza A by PCR: NEGATIVE
Influenza B by PCR: NEGATIVE
SARS Coronavirus 2 by RT PCR: NEGATIVE

## 2021-10-13 LAB — BLOOD GAS, ARTERIAL
Acid-Base Excess: 0.5 mmol/L (ref 0.0–2.0)
Bicarbonate: 24.6 mmol/L (ref 20.0–28.0)
O2 Content: 3 L/min
O2 Saturation: 91.6 %
Patient temperature: 37
pCO2 arterial: 37 mmHg (ref 32–48)
pH, Arterial: 7.43 (ref 7.35–7.45)
pO2, Arterial: 60 mmHg — ABNORMAL LOW (ref 83–108)

## 2021-10-13 MED ORDER — IOHEXOL 350 MG/ML SOLN
80.0000 mL | Freq: Once | INTRAVENOUS | Status: AC | PRN
Start: 2021-10-13 — End: 2021-10-13
  Administered 2021-10-13: 80 mL via INTRAVENOUS

## 2021-10-13 MED ORDER — PREDNISONE 20 MG PO TABS
40.0000 mg | ORAL_TABLET | Freq: Every day | ORAL | Status: DC
  Administered 2021-10-15 – 2021-10-16 (×2): 40 mg via ORAL
  Filled 2021-10-13 (×2): qty 2

## 2021-10-13 MED ORDER — IPRATROPIUM-ALBUTEROL 0.5-2.5 (3) MG/3ML IN SOLN
3.0000 mL | Freq: Once | RESPIRATORY_TRACT | Status: AC
  Administered 2021-10-13: 3 mL via RESPIRATORY_TRACT
  Filled 2021-10-13: qty 3

## 2021-10-13 MED ORDER — ACETAMINOPHEN 650 MG RE SUPP: 650.0000 mg | Freq: Four times a day (QID) | RECTAL | Status: DC | PRN

## 2021-10-13 MED ORDER — ACETAMINOPHEN 325 MG PO TABS: 650.0000 mg | ORAL_TABLET | Freq: Four times a day (QID) | ORAL | Status: DC | PRN

## 2021-10-13 MED ORDER — CITALOPRAM HYDROBROMIDE 20 MG PO TABS
40.0000 mg | ORAL_TABLET | Freq: Every day | ORAL | Status: DC
  Administered 2021-10-14 – 2021-10-16 (×3): 40 mg via ORAL
  Filled 2021-10-13 (×3): qty 2

## 2021-10-13 MED ORDER — FLUTICASONE FUROATE-VILANTEROL 100-25 MCG/ACT IN AEPB
1.0000 | INHALATION_SPRAY | Freq: Every day | RESPIRATORY_TRACT | Status: DC
  Administered 2021-10-14 – 2021-10-16 (×3): 1 via RESPIRATORY_TRACT
  Filled 2021-10-13: qty 28

## 2021-10-13 MED ORDER — DOXYCYCLINE HYCLATE 100 MG PO TABS
100.0000 mg | ORAL_TABLET | Freq: Two times a day (BID) | ORAL | Status: DC
  Administered 2021-10-13 – 2021-10-16 (×6): 100 mg via ORAL
  Filled 2021-10-13 (×6): qty 1

## 2021-10-13 MED ORDER — HEPARIN SODIUM (PORCINE) 5000 UNIT/ML IJ SOLN
5000.0000 [IU] | Freq: Three times a day (TID) | INTRAMUSCULAR | Status: DC
  Administered 2021-10-13 – 2021-10-16 (×8): 5000 [IU] via SUBCUTANEOUS
  Filled 2021-10-13 (×8): qty 1

## 2021-10-13 MED ORDER — CALCIUM CARBONATE ANTACID 500 MG PO CHEW
5.0000 | CHEWABLE_TABLET | Freq: Three times a day (TID) | ORAL | Status: DC
  Administered 2021-10-14 – 2021-10-16 (×4): 1000 mg via ORAL
  Filled 2021-10-13 (×4): qty 5

## 2021-10-13 MED ORDER — LOSARTAN POTASSIUM-HCTZ 50-12.5 MG PO TABS
1.0000 | ORAL_TABLET | Freq: Every day | ORAL | Status: DC
Start: 2021-10-14 — End: 2021-10-13

## 2021-10-13 MED ORDER — VITAMIN D (ERGOCALCIFEROL) 1.25 MG (50000 UNIT) PO CAPS
ORAL_CAPSULE | ORAL | Status: DC
  Administered 2021-10-14: 50000 [IU] via ORAL
  Filled 2021-10-13: qty 1

## 2021-10-13 MED ORDER — LETROZOLE 2.5 MG PO TABS
2.5000 mg | ORAL_TABLET | Freq: Every day | ORAL | Status: DC
  Administered 2021-10-14 – 2021-10-16 (×3): 2.5 mg via ORAL
  Filled 2021-10-13 (×3): qty 1

## 2021-10-13 MED ORDER — METHYLPREDNISOLONE SODIUM SUCC 125 MG IJ SOLR
125.0000 mg | Freq: Once | INTRAMUSCULAR | Status: AC
  Administered 2021-10-13: 125 mg via INTRAVENOUS
  Filled 2021-10-13: qty 2

## 2021-10-13 MED ORDER — HYDROCHLOROTHIAZIDE 12.5 MG PO TABS
12.5000 mg | ORAL_TABLET | Freq: Every day | ORAL | Status: DC
  Administered 2021-10-14 – 2021-10-16 (×3): 12.5 mg via ORAL
  Filled 2021-10-13 (×3): qty 1

## 2021-10-13 MED ORDER — UMECLIDINIUM BROMIDE 62.5 MCG/ACT IN AEPB
1.0000 | INHALATION_SPRAY | Freq: Every day | RESPIRATORY_TRACT | Status: DC
  Administered 2021-10-14 – 2021-10-16 (×3): 1 via RESPIRATORY_TRACT
  Filled 2021-10-13: qty 7

## 2021-10-13 MED ORDER — METHYLPREDNISOLONE SODIUM SUCC 40 MG IJ SOLR
40.0000 mg | Freq: Two times a day (BID) | INTRAMUSCULAR | Status: AC
  Administered 2021-10-14 (×2): 40 mg via INTRAVENOUS
  Filled 2021-10-13 (×2): qty 1

## 2021-10-13 MED ORDER — IPRATROPIUM-ALBUTEROL 0.5-2.5 (3) MG/3ML IN SOLN
3.0000 mL | Freq: Four times a day (QID) | RESPIRATORY_TRACT | Status: DC
  Administered 2021-10-14 – 2021-10-16 (×9): 3 mL via RESPIRATORY_TRACT
  Filled 2021-10-13 (×9): qty 3

## 2021-10-13 MED ORDER — GUAIFENESIN ER 600 MG PO TB12
600.0000 mg | ORAL_TABLET | Freq: Two times a day (BID) | ORAL | Status: DC
  Administered 2021-10-14 – 2021-10-16 (×6): 600 mg via ORAL
  Filled 2021-10-13 (×6): qty 1

## 2021-10-13 MED ORDER — PANTOPRAZOLE SODIUM 20 MG PO TBEC
20.0000 mg | DELAYED_RELEASE_TABLET | Freq: Two times a day (BID) | ORAL | Status: DC
  Administered 2021-10-14 – 2021-10-16 (×6): 20 mg via ORAL
  Filled 2021-10-13 (×6): qty 1

## 2021-10-13 MED ORDER — ONDANSETRON HCL 4 MG/2ML IJ SOLN: 4.0000 mg | Freq: Four times a day (QID) | INTRAMUSCULAR | Status: DC | PRN

## 2021-10-13 MED ORDER — ONDANSETRON HCL 4 MG PO TABS
4.0000 mg | ORAL_TABLET | Freq: Four times a day (QID) | ORAL | Status: DC | PRN
  Filled 2021-10-13: qty 1

## 2021-10-13 MED ORDER — MONTELUKAST SODIUM 10 MG PO TABS
10.0000 mg | ORAL_TABLET | Freq: Every morning | ORAL | Status: DC
  Administered 2021-10-14 – 2021-10-16 (×3): 10 mg via ORAL
  Filled 2021-10-13 (×3): qty 1

## 2021-10-13 MED ORDER — ALBUTEROL SULFATE (2.5 MG/3ML) 0.083% IN NEBU: 2.5000 mg | INHALATION_SOLUTION | RESPIRATORY_TRACT | Status: DC | PRN

## 2021-10-13 MED ORDER — LOSARTAN POTASSIUM 50 MG PO TABS
50.0000 mg | ORAL_TABLET | Freq: Every day | ORAL | Status: DC
  Administered 2021-10-14 – 2021-10-16 (×3): 50 mg via ORAL
  Filled 2021-10-13 (×3): qty 1

## 2021-10-13 NOTE — ED Provider Notes (Signed)
[facility] Provider Note    Event Date/Time   First MD Initiated Contact with Patient 10/13/21 1733     (approximate)   History   Shortness of Breath   HPI  Nancy Patton is a 61 y.o. female who presents to the emergency department today because of concerns for shortness of breath.  Patient states she has history of asthma.  She says that since beginning the year she seems to be getting asthma exacerbations every month.  She will be put on medication which will help however it will then recur.  Patient denies any recent fevers.     Physical Exam   Triage Vital Signs: ED Triage Vitals  Enc Vitals Group     BP 10/13/21 1647 109/71     Pulse Rate 10/13/21 1647 73     Resp 10/13/21 1647 19     Temp 10/13/21 1647 98 F (36.7 C)     Temp src --      SpO2 10/13/21 1647 100 %     Weight --      Height --      Head Circumference --      Peak Flow --      Pain Score 10/13/21 1527 0     Pain Loc --      Pain Edu? --      Excl. in [place]? --     Most recent vital signs: Vitals:   10/13/21 1647  BP: 109/71  Pulse: 73  Resp: 19  Temp: 98 F (36.7 C)  SpO2: 100%    General: Awake, alert, oriented. CV:  Good peripheral perfusion. Regular rate and rhythm. Resp:  Increased work of breathing, diffuse expiratory wheezing. Abd:  No distention.    ED Results / Procedures / Treatments   Labs (all labs ordered are listed, but only abnormal results are displayed) Labs Reviewed  COMPREHENSIVE METABOLIC PANEL - Abnormal; Notable for the following components:      Result Value   Potassium 3.3 (*)    All other components within normal limits  CBC WITH DIFFERENTIAL/PLATELET - Abnormal; Notable for the following components:   Eosinophils Absolute 1.1 (*)    All other components within normal limits  RESP PANEL BY RT-PCR (FLU A&B, COVID) ARPGX2     EKG  I, Nance Pear, attending physician, personally viewed and interpreted this  EKG  EKG Time: 1529 Rate: 70 Rhythm: normal sinsu rhythm Axis: normal Intervals: qtc 470 QRS: narrow, low voltage ST changes: no st elevation Impression: abnormal ekg   RADIOLOGY I independently interpreted and visualized the CXR. My interpretation: No pneumonia. Radiology interpretation:  IMPRESSION:  1. Indistinct opacity peripherally at the left lung base, probably  from atelectasis or scarring, less likely pneumonia.  2. Airway thickening is present, suggesting bronchitis or reactive  airways disease.   CT angio IMPRESSION:  1. No pulmonary embolism.  2. Extensive endobronchial debris with airway impaction within the  lingula and lower lobes bilaterally suspicious for aspiration given  the distribution. Infection and/or airway inflammation could appear  similarly.  3. Multifocal air trapping likely related to small airways disease.  Bronchial wall thickening in keeping with large airway inflammation.  4. Marked narrowing of the right bronchus intermedius in keeping  with bronchomalacia.    PROCEDURES:  Critical Care performed: No  Procedures   MEDICATIONS ORDERED IN ED: Medications - No data to display   IMPRESSION / MDM / [place] / ED COURSE  I reviewed the triage vital signs and the nursing notes.                              Differential diagnosis includes, but is not limited to, pneumonia, pneumothorax, COVID, COPD, asthma, CHF.  Patient's presentation is most consistent with acute presentation with potential threat to life or bodily function.  Patient presented to the emergency department today because of concerns for shortness of breath.  Here in the emergency department patient was found to be hypoxic on room air.  She did have diffuse wheezing.  Patient was placed on supplemental oxygen.  She was given steroids and DuoNeb treatments.  Chest x-ray without any pneumonia.  D-dimer was elevated.  CT angio did not show any PE but did show  possible debris in her airways.  I discussed this with the patient.  Discussed with Dr. Marcello Moores with the hospitalist service who will plan on admission.   FINAL CLINICAL IMPRESSION(S) / ED DIAGNOSES   Final diagnoses:  Hypoxia  Exacerbation of asthma, unspecified asthma severity, unspecified whether persistent     Note:  This document was prepared using Dragon voice recognition software and may include unintentional dictation errors.    Nance Pear, MD 10/13/21 [phone]

## 2021-10-13 NOTE — ED Notes (Signed)
Pt 87-88% on RA. 2L/min placed on pt, 02 sat now 93%

## 2021-10-13 NOTE — ED Triage Notes (Addendum)
Pt presents to ED with c/o of SOB that started today. Pt states HX of asthma. Pt denies fevers or chills. NAD noted. Pt states productive cough with clear sputum. Pt speaking in full sentences with NAD noted.

## 2021-10-13 NOTE — H&P (Addendum)
History and Physical    [name] WGY:[id number] DOB: [birth year] DOA: 10/13/2021  PCP: Perrin Maltese, MD  Patient coming from: home  I have personally briefly reviewed patient's old medical records in [place]  Chief Complaint: sob  HPI: Nancy Patton is a 61 y.o. female with medical history significant of Asthma, ?OSA , Pulmonary HTN, obesity , HTN , DMII, BRCA  who presents to ed with acute on set of SOB that started hours PTA She states ever since being ill late last year she has had recurrent flare of sob each month with either hospitalizations or ED visits around 2 weeks s/p steroid taper. She states currently, no associated chills,fever /n/v/ diarrhea/ abdominal pain. She does note cough productive of while sputum. She denies tobacco abuse, sick contacts  or recent travel. She does endorse frequent GERD symptoms.   ED Course:  Vitals: 98, bp 109/71, hr 73, rr 19 , 97% on 3L  Labs:  Wbc 6.5, hgb 14.1 , plt 248, pmn 42,  NA 139, K 3.3, gl105, cr 0.88,  Sat 97% on 3L D-dimer 0.56 XBL:[id number]: 1. Indistinct opacity peripherally at the left lung base, probably from atelectasis or scarring, less likely pneumonia. 2. Airway thickening is present, suggesting bronchitis or reactive airways disease.   CTPA IMPRESSION: 1. No pulmonary embolism. 2. Extensive endobronchial debris with airway impaction within the lingula and lower lobes bilaterally suspicious for aspiration given the distribution. Infection and/or airway inflammation could appear similarly. 3. Multifocal air trapping likely related to small airways disease. Bronchial wall thickening in keeping with large airway inflammation. 4. Marked narrowing of the right bronchus intermedius in keeping with bronchomalacia.   Respiratory panel neg   EKG:nsr  no st -t wave changes   Tx solumedrol, 125,nebs Review of Systems: As per HPI otherwise 10 point review of systems negative.   Past  Medical History:  Diagnosis Date   Arthritis    Asthma    Breast cancer ([place])    Diabetes mellitus without complication ([place])    Hypertension    Obesity    Personal history of radiation therapy    Pulmonary hypertension ([place])    Sleep apnea     Past Surgical History:  Procedure Laterality Date   BREAST LUMPECTOMY Right 05/23/2019   invasive DCIS radation    CATARACT EXTRACTION     CESAREAN SECTION     x 3      reports that she has quit smoking. Her smoking use included cigarettes. She has never used smokeless tobacco. She reports that she does not drink alcohol and does not use drugs.  No Known Allergies  Family History  Problem Relation Age of Onset   Diabetes Mother    Stroke Mother    Diabetes Father    Diabetes Brother    Cancer Neg Hx    Heart failure Neg Hx    Hypertension Neg Hx    Breast cancer Neg Hx     Prior to Admission medications   Medication Sig Start Date End Date Taking? Authorizing Provider  calcium elemental as carbonate (CALCIUM ANTACID ULTRA) 400 MG chewable tablet Chew 1,000 mg by mouth 3 (three) times daily.   Yes [provider]  citalopram (CELEXA) 40 MG tablet Take 40 mg by mouth daily.  09/15/14  Yes [provider]  D3-50 1.25 MG (50000 UT) capsule Take by mouth once a week. 08/22/19  Yes [provider]  losartan-hydrochlorothiazide (HYZAAR) 50-12.5 MG per  tablet Take 1 tablet by mouth daily. 09/15/14  Yes [provider]  metFORMIN (GLUCOPHAGE) 1000 MG tablet Take 1,000 mg by mouth 2 (two) times daily. 09/09/14  Yes [provider]  montelukast (SINGULAIR) 10 MG tablet Take 10 mg by mouth every morning.  09/14/14  Yes [provider]  MOUNJARO 5 MG/0.5ML Pen Inject 5 mg into the skin once a week. 10/03/21  Yes [provider]  TRELEGY ELLIPTA 100-62.5-25 MCG/ACT AEPB Take 1 puff by mouth daily. 03/27/21  Yes [provider]  albuterol (VENTOLIN HFA) 108 (90 Base) MCG/ACT  inhaler Inhale 2 puffs into the lungs every 6 (six) hours as needed for wheezing or shortness of breath. 07/06/21   Sidney Ace, MD  budesonide-formoterol (SYMBICORT) 160-4.5 MCG/ACT inhaler Inhale 2 puffs into the lungs 2 (two) times daily. Patient not taking: Reported on 07/06/2021    [provider]  ibuprofen (ADVIL) 800 MG tablet Take 800 mg by mouth every 8 (eight) hours as needed. 06/20/21   [provider]  ipratropium-albuterol (DUONEB) 0.5-2.5 (3) MG/3ML SOLN Take by nebulization 3 (three) times daily as needed. 10/08/19   [provider]  letrozole (FEMARA) 2.5 MG tablet TAKE 1 TABLET BY MOUTH EVERY DAY (DON'T START MED UNTIL YOU COMPLETE RADIATION 1ST) 04/25/21   Sindy Guadeloupe, MD  predniSONE (DELTASONE) 10 MG tablet Take 1 tablet (10 mg total) by mouth daily. Day 1-3: take 4 tablets PO daily Day 4-6: take 3 tablets PO daily Day 7-9: take 2 tablets PO daily Day 10-12: take 1 tablet PO daily Patient not taking: Reported on 10/13/2021 08/29/21   Harvest Dark, MD    Physical Exam: Vitals:   10/13/21 1647 10/13/21 1916 10/13/21 2000 10/13/21 2203  BP: 109/71 120/73 115/68 126/73  Pulse: 73 69 77 79  Resp: '19 19  20  ' Temp: 98 F (36.7 C)   98.2 F (36.8 C)  TempSrc:    Oral  SpO2: 100% 96% 93% 95%    Vitals:   10/13/21 1647 10/13/21 1916 10/13/21 2000 10/13/21 2203  BP: 109/71 120/73 115/68 126/73  Pulse: 73 69 77 79  Resp: '19 19  20  ' Temp: 98 F (36.7 C)   98.2 F (36.8 C)  TempSrc:    Oral  SpO2: 100% 96% 93% 95%   Constitutional: NAD, calm, comfortable Eyes: PERRL, lids and conjunctivae normal ENMT: Mucous membranes are moist. Posterior pharynx clear of any exudate or lesions.Normal dentition.  Neck: normal, supple, no masses, no thyromegaly Respiratory: + rhonchi ,+ wheezing, no crackles. Normal respiratory effort. No accessory muscle use.  Cardiovascular: Regular rate and rhythm, no murmurs / rubs / gallops. No extremity edema. +  pedal pulses.  Abdomen: no tenderness, no masses palpated. No hepatosplenomegaly. Bowel sounds positive.  Musculoskeletal: no clubbing / cyanosis. No joint deformity upper and lower extremities. Good ROM, no contractures. Normal muscle tone.  Skin: no rashes, lesions, ulcers. No induration Neurologic: CN 2-12 grossly intact. Sensation intact,  Strength 5/5 in all 4.  Psychiatric: Normal judgment and insight. Alert and oriented x 3. Normal mood.    Labs on Admission: I have personally reviewed following labs and imaging studies  CBC: Recent Labs  Lab 10/13/21 1542  WBC 6.5  NEUTROABS 2.8  HGB 14.1  HCT 43.1  MCV 86.2  PLT 092   Basic Metabolic Panel: Recent Labs  Lab 10/13/21 1542  NA 139  K 3.3*  CL 105  CO2 24  GLUCOSE 83  BUN 18  CREATININE 0.88  CALCIUM 9.4   GFR: CrCl cannot be calculated (Unknown ideal weight.). Liver Function Tests: Recent Labs  Lab 10/13/21 1542  AST 29  ALT 18  ALKPHOS 53  BILITOT 0.9  PROT 7.3  ALBUMIN 4.5   No results for input(s): "LIPASE", "AMYLASE" in the last 168 hours. No results for input(s): "AMMONIA" in the last 168 hours. Coagulation Profile: No results for input(s): "INR", "PROTIME" in the last 168 hours. Cardiac Enzymes: No results for input(s): "CKTOTAL", "CKMB", "CKMBINDEX", "TROPONINI" in the last 168 hours. BNP (last 3 results) No results for input(s): "PROBNP" in the last 8760 hours. HbA1C: No results for input(s): "HGBA1C" in the last 72 hours. CBG: No results for input(s): "GLUCAP" in the last 168 hours. Lipid Profile: No results for input(s): "CHOL", "HDL", "LDLCALC", "TRIG", "CHOLHDL", "LDLDIRECT" in the last 72 hours. Thyroid Function Tests: No results for input(s): "TSH", "T4TOTAL", "FREET4", "T3FREE", "THYROIDAB" in the last 72 hours. Anemia Panel: No results for input(s): "VITAMINB12", "FOLATE", "FERRITIN", "TIBC", "IRON", "RETICCTPCT" in the last 72 hours. Urine analysis:    Component Value  Date/Time   COLORURINE YELLOW (A) 07/06/2021 0352   APPEARANCEUR HAZY (A) 07/06/2021 0352   LABSPEC 1.031 (H) 07/06/2021 0352   PHURINE 5.0 07/06/2021 0352   GLUCOSEU NEGATIVE 07/06/2021 0352   HGBUR NEGATIVE 07/06/2021 0352   BILIRUBINUR NEGATIVE 07/06/2021 0352   KETONESUR 20 (A) 07/06/2021 0352   PROTEINUR NEGATIVE 07/06/2021 0352   NITRITE NEGATIVE 07/06/2021 0352   LEUKOCYTESUR MODERATE (A) 07/06/2021 0352    Radiological Exams on Admission: CT Angio Chest PE W and/or Wo Contrast  Result Date: 10/13/2021 CLINICAL DATA:  Dyspnea, asthma EXAM: CT ANGIOGRAPHY CHEST WITH CONTRAST TECHNIQUE: Multidetector CT imaging of the chest was performed using the standard protocol during bolus administration of intravenous contrast. Multiplanar CT image reconstructions and MIPs were obtained to evaluate the vascular anatomy. RADIATION DOSE REDUCTION: This exam was performed according to the departmental dose-optimization program which includes automated exposure control, adjustment of the mA and/or kV according to patient size and/or use of iterative reconstruction technique. CONTRAST:  79m OMNIPAQUE IOHEXOL 350 MG/ML SOLN COMPARISON:  None Available. FINDINGS: Cardiovascular: Adequate opacification of the pulmonary arterial tree. No intraluminal filling defect identified to suggest acute pulmonary embolism. Central pulmonary arteries are of normal caliber. No significant coronary artery calcification. Cardiac size within normal limits. No pericardial effusion. No significant atherosclerotic calcification within the thoracic aorta. No aortic aneurysm. Mediastinum/Nodes: No enlarged mediastinal, hilar, or axillary lymph nodes. Thyroid gland, trachea, and esophagus demonstrate no significant findings. Lungs/Pleura: There is mosaic attenuation of the pulmonary parenchyma in keeping with multifocal air trapping likely related to small airways disease. There is mild bronchial wall thickening noted in keeping with  airway inflammation. There is extensive endobronchial debris with airway impaction within the lingula and lower lobes bilaterally suspicious for aspiration given the distribution. Infection and/or airway inflammation could appear similarly. There is marked narrowing of the right bronchus intermedius best seen on image # 68/5, in keeping with changes of bronchomalacia. No pneumothorax or pleural effusion. Upper Abdomen: No acute abnormality. Musculoskeletal: No acute bone abnormality Review of the MIP images confirms the above findings. IMPRESSION: 1. No pulmonary embolism. 2. Extensive endobronchial debris with airway impaction within the lingula and lower lobes bilaterally suspicious for aspiration given the distribution. Infection and/or airway inflammation could appear similarly. 3. Multifocal air trapping likely related to small airways disease. Bronchial wall thickening in keeping with large airway inflammation. 4. Marked narrowing of the right  bronchus intermedius in keeping with bronchomalacia. Electronically Signed   By: Fidela Salisbury M.D.   On: 10/13/2021 20:46   DG Chest 2 View  Result Date: 10/13/2021 CLINICAL DATA:  Shortness of breath and cough EXAM: CHEST - 2 VIEW COMPARISON:  08/29/2021 FINDINGS: Airway thickening is present, suggesting bronchitis or reactive airways disease. Cardiac and mediastinal margins appear normal. Indistinct opacity peripherally at the left lung base could be from atelectasis or pneumonia. There is some volume loss in this vicinity on the prior CT from 07/06/2021. The lungs appear otherwise clear.  Mild thoracic spondylosis. IMPRESSION: 1. Indistinct opacity peripherally at the left lung base, probably from atelectasis or scarring, less likely pneumonia. 2. Airway thickening is present, suggesting bronchitis or reactive airways disease. Electronically Signed   By: Van Clines M.D.   On: 10/13/2021 15:56    EKG: Independently reviewed. See above    Assessment/Plan   Acute Asthma Exacerbation with hypoxemia Possible associated aspiration pneumonitis  Acute/chronic airway inflammation nos  -no fever no wbc count  -resp panel negative for covid/ flu  -will order full respiratory panel -speech and swallow   -as well as sputum samples  -patient with hx of recurrent exacerbation ?smoldering atypical infection  - check inflammatory markers  -will place on doxycycline/ steroids / nebs  - pulmonary toilet  -wean O2 as able  -abg to be complete -pulmonary consult for further assistance  GERD -start ppi   ?OSA  Pulmonary HTN -patient unware of diagnosis  -since last hospitalization unable to f/u with pulmonary as outpatient -f/u pulmonary recs    Obesity- which complicates picture and prognosis   HTN -resume home regimen    DMII -iss/fs    BRCA -followed by oncology     Hypokalemia replete prn   DVT prophylaxis: heparin Code Status: full Family Communication:  Disposition Plan: patient  expected to be admitted greater than 2 midnights  Consults called: Pulmonary [place] Admission status: progressive care    Clance Boll MD [facility]  If 7PM-7AM, please contact night-coverage [email or web address] Password [facility]  10/13/2021, 10:18 PM

## 2021-10-14 DIAGNOSIS — E66813 Obesity, class 3: Secondary | ICD-10-CM

## 2021-10-14 DIAGNOSIS — E876 Hypokalemia: Secondary | ICD-10-CM

## 2021-10-14 DIAGNOSIS — E669 Obesity, unspecified: Secondary | ICD-10-CM

## 2021-10-14 DIAGNOSIS — K219 Gastro-esophageal reflux disease without esophagitis: Secondary | ICD-10-CM | POA: Diagnosis not present

## 2021-10-14 DIAGNOSIS — J9601 Acute respiratory failure with hypoxia: Secondary | ICD-10-CM

## 2021-10-14 DIAGNOSIS — I10 Essential (primary) hypertension: Secondary | ICD-10-CM

## 2021-10-14 DIAGNOSIS — J4541 Moderate persistent asthma with (acute) exacerbation: Secondary | ICD-10-CM | POA: Diagnosis not present

## 2021-10-14 DIAGNOSIS — E1169 Type 2 diabetes mellitus with other specified complication: Secondary | ICD-10-CM

## 2021-10-14 DIAGNOSIS — J45901 Unspecified asthma with (acute) exacerbation: Secondary | ICD-10-CM | POA: Diagnosis not present

## 2021-10-14 DIAGNOSIS — I272 Pulmonary hypertension, unspecified: Secondary | ICD-10-CM

## 2021-10-14 HISTORY — DX: Hypokalemia: E87.6

## 2021-10-14 LAB — RESPIRATORY PANEL BY PCR

## 2021-10-14 LAB — CBG MONITORING, ED
Glucose-Capillary: 158 mg/dL — ABNORMAL HIGH (ref 70–99)
Glucose-Capillary: 167 mg/dL — ABNORMAL HIGH (ref 70–99)
Glucose-Capillary: 141 mg/dL — ABNORMAL HIGH (ref 70–99)

## 2021-10-14 LAB — CBC
HCT: 39.7 % (ref 36.0–46.0)
Hemoglobin: 13.2 g/dL (ref 12.0–15.0)
MCH: 28.2 pg (ref 26.0–34.0)
MCHC: 33.2 g/dL (ref 30.0–36.0)
MCV: 84.8 fL (ref 80.0–100.0)
Platelets: 216 10*3/uL (ref 150–400)
RBC: 4.68 MIL/uL (ref 3.87–5.11)
RDW: 12.8 % (ref 11.5–15.5)
WBC: 3.5 10*3/uL — ABNORMAL LOW (ref 4.0–10.5)
nRBC: 0 % (ref 0.0–0.2)

## 2021-10-14 LAB — C-REACTIVE PROTEIN: CRP: 0.6 mg/dL (ref <1.0)

## 2021-10-14 LAB — MRSA NEXT GEN BY PCR, NASAL: MRSA by PCR Next Gen: NOT DETECTED

## 2021-10-14 LAB — COMPREHENSIVE METABOLIC PANEL
ALT: 17 U/L (ref 0–44)
AST: 23 U/L (ref 15–41)
Albumin: 4 g/dL (ref 3.5–5.0)
Alkaline Phosphatase: 54 U/L (ref 38–126)
Anion gap: 6 (ref 5–15)
BUN: 16 mg/dL (ref 8–23)
CO2: 26 mmol/L (ref 22–32)
Calcium: 9.3 mg/dL (ref 8.9–10.3)
Chloride: 108 mmol/L (ref 98–111)
Creatinine, Ser: 0.8 mg/dL (ref 0.44–1.00)
GFR, Estimated: >60 mL/min (ref >60)
Glucose, Bld: 181 mg/dL — ABNORMAL HIGH (ref 70–99)
Potassium: 4.2 mmol/L (ref 3.5–5.1)
Sodium: 140 mmol/L (ref 135–145)
Total Bilirubin: 0.8 mg/dL (ref 0.3–1.2)
Total Protein: 7 g/dL (ref 6.5–8.1)

## 2021-10-14 LAB — PROCALCITONIN: Procalcitonin: <0.1 ng/mL

## 2021-10-14 LAB — HEMOGLOBIN A1C
Hgb A1c MFr Bld: 5.6 % (ref 4.8–5.6)
Mean Plasma Glucose: 114.02 mg/dL

## 2021-10-14 LAB — GLUCOSE, CAPILLARY: Glucose-Capillary: 131 mg/dL — ABNORMAL HIGH (ref 70–99)

## 2021-10-14 LAB — BRAIN NATRIURETIC PEPTIDE: B Natriuretic Peptide: 9.4 pg/mL (ref 0.0–100.0)

## 2021-10-14 MED ORDER — INSULIN ASPART 100 UNIT/ML IJ SOLN
0.0000 [IU] | Freq: Three times a day (TID) | INTRAMUSCULAR | Status: DC
  Administered 2021-10-14: 3 [IU] via SUBCUTANEOUS
  Administered 2021-10-14: 2 [IU] via SUBCUTANEOUS
  Administered 2021-10-15: 5 [IU] via SUBCUTANEOUS
  Administered 2021-10-15: 2 [IU] via SUBCUTANEOUS
  Filled 2021-10-14 (×3): qty 1

## 2021-10-14 MED ORDER — ORAL CARE MOUTH RINSE: 15.0000 mL | OROMUCOSAL | Status: DC | PRN

## 2021-10-14 MED ORDER — INSULIN ASPART 100 UNIT/ML IJ SOLN: 0.0000 [IU] | Freq: Every day | INTRAMUSCULAR | Status: DC

## 2021-10-14 NOTE — Evaluation (Addendum)
Clinical/Bedside Swallow Evaluation Patient Details  Name: Consepcion Patton MRN: [id number] Date of Birth: May 24, 1960  Today's Date: 10/14/2021 Time: SLP Start Time (ACUTE ONLY): 0900 SLP Stop Time (ACUTE ONLY): 1000 SLP Time Calculation (min) (ACUTE ONLY): 60 min  Past Medical History:  Past Medical History:  Diagnosis Date   Arthritis    Asthma    Breast cancer ([place])    Diabetes mellitus without complication ([place])    Hypertension    Obesity    Personal history of radiation therapy    Pulmonary hypertension ([place])    Sleep apnea    Past Surgical History:  Past Surgical History:  Procedure Laterality Date   BREAST LUMPECTOMY Right 05/23/2019   invasive DCIS radation    CATARACT EXTRACTION     CESAREAN SECTION     x 3    HPI:  Pt is a 61 y.o. female with medical history significant of Asthma, ?OSA , Pulmonary HTN, obesity , HTN , DMII, BRCA  who presents to ed with acute on set of SOB that started hours PTA She states ever since being ill late last year she has had recurrent flare of sob each month with either hospitalizations or ED visits around 2 weeks s/p steroid taper. She states currently, no associated chills,fever /n/v/ diarrhea/ abdominal pain. She does note cough productive of while sputum. She denies tobacco abuse, sick contacts  or recent travel. She does endorse frequent GERD symptoms.   CTPA  IMPRESSION:  1. No pulmonary embolism.  2. Extensive endobronchial debris with airway impaction within the  lingula and lower lobes bilaterally suspicious for aspiration given  the distribution. Infection and/or airway inflammation could appear  similarly.  3. Multifocal air trapping likely related to small airways disease.  Bronchial wall thickening in keeping with large airway inflammation.  4. Marked narrowing of the right bronchus intermedius in keeping  with bronchomalacia.    Assessment / Plan / Recommendation  Clinical Impression   Pt seen for BSE this morning. Daughter  present briefly. Pt A/O x4; engaged easily and eager to learn any information to "get me better".  On [place] O2 support- 3L; afebrile, WBC not elevated.    OF NOTE: Pt does strongly endorse s/s of REFLUX at home; self-medicates w/ TUMS. She reports episodes "during the night" that awaken her w/ coughing and having to spit up/out increased phlegm(white, bubbly, stringy). She also endorses coughing around/after meals w/ hiccups and belching. CT of Chest: "Extensive endobronchial debris with airway impaction within the lingula and lower lobes bilaterally".   Pt appears to present w/ adequate oropharyngeal phase swallowing function w/ No overt oropharyngeal phase dysphagia appreciated during oral intake of trials, breakfast meal; No neuromuscular swallowing deficits appreciated. Pt appears at reduced risk for aspiration from an oropharyngeal phase standpoint following general aspiration precautions. HOWEVER, pt has a baseline presentation of REFLUX and episodes of REFLUX behavior, especially at night. She is not on a PPI. Noted the CT scan results this admit. ANY Dysmotility or Regurgitation of Reflux material can increase risk for aspiration of the Reflux material during Retrograde flow thus impact Voicing and Pulmonary status. Pt described issues of globus, dry throat clearing, and hacking cough frequently during day/night.    Pt sat upright in bed and consumed several trials of thin liquids Via Cup/Straw, purees, and solid foods w/ No immediate, overt clinical s/s of aspiration noted; clear vocal quality b/t trials, no decline in pulmonary status, no multiple swallows noted post initial pharyngeal swallow, no  decline in O2 sats(996%). Oral phase appeared [facility] for bolus management and timely A-P transfer/clearing of material. Mastication appropriate for boluses. OM exam was [facility] for oral clearing; lingual/labial movements. No unilateral weakness. Speech clear.    Recommend a Regular diet (moistened foods) w/ thin  liquids. General aspiration precautions. Rest Breaks during meals/oral intake to allow for Esophageal clearing. REFLUX precautions strongly recommended to lessen chance for Regurgitation -- HOB elevated at night when sleeping.  Noted MD has initiated a PPI for pt -- this was discussed w/ pt also.  Recommend pt f/u w/ GI for assessment/management of Reflux and tx as indicated. Discussion and handouts given on REFLUX, impact of REFLUX on swallowing and breathing, PPI, behaviors to manage REFLUX, and foods/diet. MD to reconsult ST services if any new needs while admitted. NSG updated. Pt appreciative of Education information.  SLP Visit Diagnosis: Dysphagia, unspecified (R13.10) (overt s/s of REFLUX)    Aspiration Risk   (reduced from an oropharyngeal phase standpoint but increased from an Esophageal phase standpoint)    Diet Recommendation   Regular diet (moistened foods) w/ thin liquids - avoid carbonated drinks during meals. General aspiration precautions. Rest Breaks during meals/oral intake to allow for Esophageal clearing. REFLUX precautions strongly recommended to lessen chance for Regurgitation -- HOB elevated at night when sleeping.   Medication Administration: Whole meds with liquid    Other  Recommendations Recommended Consults: Consider GI evaluation Oral Care Recommendations: Oral care BID;Patient independent with oral care Other Recommendations:  (n/a)    Recommendations for follow up therapy are one component of a multi-disciplinary discharge planning process, led by the attending physician.  Recommendations may be updated based on patient status, additional functional criteria and insurance authorization.  Follow up Recommendations No SLP follow up      Assistance Recommended at Discharge None  Functional Status Assessment  (no recent decline in swallowing; REFLUX s/s)  Frequency and Duration  (n/a)   (n/a)       Prognosis Prognosis for Safe Diet Advancement: Good Barriers  to Reach Goals:  (n/a)      Swallow Study   General Date of Onset: 10/13/21 HPI: Pt is a 61 y.o. female with medical history significant of Asthma, ?OSA , Pulmonary HTN, obesity , HTN , DMII, BRCA  who presents to ed with acute on set of SOB that started hours PTA She states ever since being ill late last year she has had recurrent flare of sob each month with either hospitalizations or ED visits around 2 weeks s/p steroid taper. She states currently, no associated chills,fever /n/v/ diarrhea/ abdominal pain. She does note cough productive of while sputum. She denies tobacco abuse, sick contacts  or recent travel. She does endorse frequent GERD symptoms.  CTPA  IMPRESSION:  1. No pulmonary embolism.  2. Extensive endobronchial debris with airway impaction within the  lingula and lower lobes bilaterally suspicious for aspiration given  the distribution. Infection and/or airway inflammation could appear  similarly.  3. Multifocal air trapping likely related to small airways disease.  Bronchial wall thickening in keeping with large airway inflammation.  4. Marked narrowing of the right bronchus intermedius in keeping  with bronchomalacia. Type of Study: Bedside Swallow Evaluation Previous Swallow Assessment: none Diet Prior to this Study: Regular;Thin liquids Temperature Spikes Noted: No (wbc 3.5) Respiratory Status: Nasal cannula (3L) History of Recent Intubation: No Behavior/Cognition: Alert;Cooperative;Pleasant mood Oral Cavity Assessment: Within Functional Limits Oral Care Completed by SLP: Recent completion by staff Oral Cavity - Dentition:  Adequate natural dentition Vision: Functional for self-feeding Self-Feeding Abilities: Able to feed self Patient Positioning: Upright in bed Baseline Vocal Quality: Normal Volitional Cough: Strong;Congested Volitional Swallow: Able to elicit    Oral/Motor/Sensory Function Overall Oral Motor/Sensory Function: Within functional limits   Ice Chips Ice  chips: Not tested   Thin Liquid Thin Liquid: Within functional limits Presentation: Cup;Self Fed;Straw (~3+ ozs) Other Comments: sips of coffee while swallowing pills also    Nectar Thick Nectar Thick Liquid: Not tested   Honey Thick Honey Thick Liquid: Not tested   Puree Puree: Within functional limits Presentation: Self Fed;Spoon   Solid     Solid: Within functional limits Presentation: [place], MS, [place]; [place] [phone] (ascom)  Theador Jezewski 10/14/2021,10:25 AM

## 2021-10-14 NOTE — Progress Notes (Signed)
PROGRESS NOTE    Nancy Patton   ULA:[id number] DOB: 1960-11-18  DOA: 10/13/2021 Date of Service: 10/14/21 PCP: Perrin Maltese, MD     Brief Narrative / Hospital Course:  Nancy Patton is a 61 y.o. female with medical history significant of Asthma, ?OSA , Pulmonary HTN, obesity , HTN , DMII, BRCA  who presents to ed with acute on set of SOB that started hours PTA She states ever since being ill late last year she has had recurrent flare of sob each month with either hospitalizations or ED visits around 2 weeks s/p steroid taper 08/24: SpO2 97% on 3L. CXR: LLL opacity likely atelectasis/scar, bronchitis/RAD. CTA PE: no PE, extensive endobronchial debris with airway impaction within the lingula and lower lobes bilaterally suspicious for aspiration but infection and/or airway inflammation, multifocal air trapping, bronchial wall thickening, marked narrowing of the right bronchus intermedius ~bronchomalacia. Resp PCR neg. Tx solumedrol 125, nebs. Admitted to hospitalist service for Acute Asthma Exacerbation with hypoxemia, possible associated aspiration  pneumonitis. Started on azithromycin, contineu steroids/nebs/O2. Sputum Cx pending. Pulmonology consulted.    Consultants:  Pulmonology   Procedures: none    Subjective: Patient reports SOB is better at this time w/ breathing treatment, no CP, coughing is still present, slept better last night.      ASSESSMENT & PLAN:   Principal Problem:   Asthma exacerbation Active Problems:   Acute respiratory failure with hypoxia (HCC)   HTN (hypertension)   Asthmatic bronchitis with acute exacerbation   Type 2 diabetes mellitus (HCC)   GERD (gastroesophageal reflux disease)   Pulmonary hypertension (HCC)   Obesity   Hypokalemia  Acute Asthma Exacerbation with hypoxemia Possible associated aspiration pneumonitis  Acute on chronic airway inflammation nos  Resp panel negative for covid/ flu  Full respiratory panel  neg SLP eval concern for reflux Sputum Cx ordered Inflammatory markers CRP WNL Continue azithromycin, doxycycline Continue IV steroids --> po Continue nebs Continue Breo + Incruse, Singulair Continue Mucinex wean O2 as able  to baseline 2L O2 Pulmonology recs pending  ?OSA Pulmonary HTN since last hospitalization unable to f/u with pulmonary as outpatient Pulmonology recs pending  GERD PPI, Tums   Obesity complicates prognosis    HTN Home losartan, HCTZ    DMII SSI   Hx Breast CA followed by oncology     Hypokalemia  replete prn   Depression Continue home Celexa        DVT prophylaxis: heparin Pertinent IV fluids and/or nutrition: carb modified diet  Central lines / invasive devices: none  Code Status: FULL Family Communication: none at this time  Disposition Plan / TOC needs: inpatient, anticipate back to home Barriers to discharge / significant pending items: O2 requirements, asthma exacerbation, pending pulmonary recs  Expected date of discharge: 2-3 days pending clinical course            Objective: Vitals:   10/14/21 0523 10/14/21 0600 10/14/21 0930 10/14/21 1400  BP:  95/62 116/80 122/85  Pulse: 89 79 84 88  Resp: '16 17 18 20  ' Temp:   98.6 F (37 C) 98.7 F (37.1 C)  TempSrc:   Oral Oral  SpO2: 91% (!) 86% 92% 93%   No intake or output data in the 24 hours ending 10/14/21 1454 There were no vitals filed for this visit.  Examination:  Constitutional:  VS as above General Appearance: alert, well-developed, well-nourished, NAD Eyes: Normal lids and conjunctive, non-icteric sclera Ears, Nose, Mouth, Throat: Normal external  appearance MMM Neck: No masses, trachea midline Respiratory: Normal respiratory effort (+)diffuse wheeze No rhonchi No rales Cardiovascular: S1/S2 normal No no murmur No rub/gallop auscultated No JVD No lower extremity edema Gastrointestinal: No tenderness No masses No hernia  appreciated Musculoskeletal:  No clubbing/cyanosis of digits Symmetrical movement in all extremities Neurological: No cranial nerve deficit on limited exam Alert Psychiatric: Normal judgment/insight Normal mood and affect       Scheduled Medications:   calcium carbonate  5 tablet Oral TID WC   citalopram  40 mg Oral Daily   doxycycline  100 mg Oral Q12H   fluticasone furoate-vilanterol  1 puff Inhalation Daily   And   umeclidinium bromide  1 puff Inhalation Daily   guaiFENesin  600 mg Oral BID   heparin  5,000 Units Subcutaneous Q8H   losartan  50 mg Oral Daily   And   hydrochlorothiazide  12.5 mg Oral Daily   insulin aspart  0-15 Units Subcutaneous TID WC   insulin aspart  0-5 Units Subcutaneous QHS   ipratropium-albuterol  3 mL Nebulization Q6H   letrozole  2.5 mg Oral Daily   methylPREDNISolone (SOLU-MEDROL) injection  40 mg Intravenous Q12H   Followed by   Derrill Memo ON 10/15/2021] predniSONE  40 mg Oral Q breakfast   montelukast  10 mg Oral q morning   pantoprazole  20 mg Oral BID   Vitamin D (Ergocalciferol)   Oral Weekly    Continuous Infusions:   PRN Medications:  acetaminophen **OR** acetaminophen, albuterol, ondansetron **OR** ondansetron (ZOFRAN) IV  Antimicrobials:  Anti-infectives (From admission, onward)    Start     Dose/Rate Route Frequency Ordered Stop   10/13/21 2300  doxycycline (VIBRA-TABS) tablet 100 mg        100 mg Oral Every 12 hours 10/13/21 2259 10/18/21 2159       Data Reviewed: I have personally reviewed following labs and imaging studies  CBC: Recent Labs  Lab 10/13/21 1542 10/14/21 0702  WBC 6.5 3.5*  NEUTROABS 2.8  --   HGB 14.1 13.2  HCT 43.1 39.7  MCV 86.2 84.8  PLT 248 932   Basic Metabolic Panel: Recent Labs  Lab 10/13/21 1542 10/14/21 0702  NA 139 140  K 3.3* 4.2  CL 105 108  CO2 24 26  GLUCOSE 83 181*  BUN 18 16  CREATININE 0.88 0.80  CALCIUM 9.4 9.3   GFR: CrCl cannot be calculated (Unknown ideal  weight.). Liver Function Tests: Recent Labs  Lab 10/13/21 1542 10/14/21 0702  AST 29 23  ALT 18 17  ALKPHOS 53 54  BILITOT 0.9 0.8  PROT 7.3 7.0  ALBUMIN 4.5 4.0   No results for input(s): "LIPASE", "AMYLASE" in the last 168 hours. No results for input(s): "AMMONIA" in the last 168 hours. Coagulation Profile: No results for input(s): "INR", "PROTIME" in the last 168 hours. Cardiac Enzymes: No results for input(s): "CKTOTAL", "CKMB", "CKMBINDEX", "TROPONINI" in the last 168 hours. BNP (last 3 results) No results for input(s): "PROBNP" in the last 8760 hours. HbA1C: No results for input(s): "HGBA1C" in the last 72 hours. CBG: Recent Labs  Lab 10/14/21 0751 10/14/21 1140  GLUCAP 158* 167*   Lipid Profile: No results for input(s): "CHOL", "HDL", "LDLCALC", "TRIG", "CHOLHDL", "LDLDIRECT" in the last 72 hours. Thyroid Function Tests: No results for input(s): "TSH", "T4TOTAL", "FREET4", "T3FREE", "THYROIDAB" in the last 72 hours. Anemia Panel: No results for input(s): "VITAMINB12", "FOLATE", "FERRITIN", "TIBC", "IRON", "RETICCTPCT" in the last 72 hours. Urine analysis:  Component Value Date/Time   COLORURINE YELLOW (A) 07/06/2021 0352   APPEARANCEUR HAZY (A) 07/06/2021 0352   LABSPEC 1.031 (H) 07/06/2021 0352   PHURINE 5.0 07/06/2021 0352   GLUCOSEU NEGATIVE 07/06/2021 0352   HGBUR NEGATIVE 07/06/2021 0352   BILIRUBINUR NEGATIVE 07/06/2021 0352   KETONESUR 20 (A) 07/06/2021 0352   PROTEINUR NEGATIVE 07/06/2021 0352   NITRITE NEGATIVE 07/06/2021 0352   LEUKOCYTESUR MODERATE (A) 07/06/2021 0352   Sepsis Labs: '@LABRCNTIP' (procalcitonin:4,lacticidven:4)  Recent Results (from the past 240 hour(s))  Resp Panel by RT-PCR (Flu A&B, Covid) Anterior Nasal Swab     Status: None   Collection Time: 10/13/21  5:53 PM   Specimen: Anterior Nasal Swab  Result Value Ref Range Status   SARS Coronavirus 2 by RT PCR NEGATIVE NEGATIVE Final    Comment: (NOTE) SARS-CoV-2 target  nucleic acids are NOT DETECTED.  The SARS-CoV-2 RNA is generally detectable in upper respiratory specimens during the acute phase of infection. The lowest concentration of SARS-CoV-2 viral copies this assay can detect is 138 copies/mL. A negative result does not preclude SARS-Cov-2 infection and should not be used as the sole basis for treatment or other patient management decisions. A negative result may occur with  improper specimen collection/handling, submission of specimen other than nasopharyngeal swab, presence of viral mutation(s) within the areas targeted by this assay, and inadequate number of viral copies(<138 copies/mL). A negative result must be combined with clinical observations, patient history, and epidemiological information. The expected result is Negative.  Fact Sheet for Patients:  EntrepreneurPulse.com.au  Fact Sheet for Healthcare Providers:  IncredibleEmployment.be  This test is no t yet approved or cleared by the [place] FDA and  has been authorized for detection and/or diagnosis of SARS-CoV-2 by FDA under an Emergency Use Authorization (EUA). This EUA will remain  in effect (meaning this test can be used) for the duration of the COVID-19 declaration under Section 564(b)(1) of the Act, 21 U.S.C.section 360bbb-3(b)(1), unless the authorization is terminated  or revoked sooner.       Influenza A by PCR NEGATIVE NEGATIVE Final   Influenza B by PCR NEGATIVE NEGATIVE Final    Comment: (NOTE) The Xpert Xpress SARS-CoV-2/FLU/RSV plus assay is intended as an aid in the diagnosis of influenza from Nasopharyngeal swab specimens and should not be used as a sole basis for treatment. Nasal washings and aspirates are unacceptable for Xpert Xpress SARS-CoV-2/FLU/RSV testing.  Fact Sheet for Patients: EntrepreneurPulse.com.au  Fact Sheet for Healthcare  Providers: IncredibleEmployment.be  This test is not yet approved or cleared by the [place] FDA and has been authorized for detection and/or diagnosis of SARS-CoV-2 by FDA under an Emergency Use Authorization (EUA). This EUA will remain in effect (meaning this test can be used) for the duration of the COVID-19 declaration under Section 564(b)(1) of the Act, 21 U.S.C. section 360bbb-3(b)(1), unless the authorization is terminated or revoked.  Performed at [facility], [place], [place] [address]   Respiratory (~20 pathogens) panel by PCR     Status: None   Collection Time: 10/14/21  7:09 AM   Specimen: Nasopharyngeal Swab; Respiratory  Result Value Ref Range Status   Adenovirus NOT DETECTED NOT DETECTED Final   Coronavirus 229E NOT DETECTED NOT DETECTED Final    Comment: (NOTE) The Coronavirus on the Respiratory Panel, DOES NOT test for the novel  Coronavirus (2019 nCoV)    Coronavirus HKU1 NOT DETECTED NOT DETECTED Final   Coronavirus NL63 NOT DETECTED NOT DETECTED Final  Coronavirus OC43 NOT DETECTED NOT DETECTED Final   Metapneumovirus NOT DETECTED NOT DETECTED Final   Rhinovirus / Enterovirus NOT DETECTED NOT DETECTED Final   Influenza A NOT DETECTED NOT DETECTED Final   Influenza B NOT DETECTED NOT DETECTED Final   Parainfluenza Virus 1 NOT DETECTED NOT DETECTED Final   Parainfluenza Virus 2 NOT DETECTED NOT DETECTED Final   Parainfluenza Virus 3 NOT DETECTED NOT DETECTED Final   Parainfluenza Virus 4 NOT DETECTED NOT DETECTED Final   Respiratory Syncytial Virus NOT DETECTED NOT DETECTED Final   Bordetella pertussis NOT DETECTED NOT DETECTED Final   Bordetella Parapertussis NOT DETECTED NOT DETECTED Final   Chlamydophila pneumoniae NOT DETECTED NOT DETECTED Final   Mycoplasma pneumoniae NOT DETECTED NOT DETECTED Final    Comment: Performed at [facility], [place] [address], [place], [place] [address]  MRSA Next Gen by  PCR, Nasal     Status: None   Collection Time: 10/14/21  7:09 AM   Specimen: Nasopharyngeal Swab; Nasal Swab  Result Value Ref Range Status   MRSA by PCR Next Gen NOT DETECTED NOT DETECTED Final    Comment: (NOTE) The GeneXpert MRSA Assay (FDA approved for NASAL specimens only), is one component of a comprehensive MRSA colonization surveillance program. It is not intended to diagnose MRSA infection nor to guide or monitor treatment for MRSA infections. Test performance is not FDA approved in patients less than 76 years old. Performed at [facility], [address] [place], [place] [address]          Radiology Studies: CT Angio Chest PE W and/or Wo Contrast  Result Date: 10/13/2021 CLINICAL DATA:  Dyspnea, asthma EXAM: CT ANGIOGRAPHY CHEST WITH CONTRAST TECHNIQUE: Multidetector CT imaging of the chest was performed using the standard protocol during bolus administration of intravenous contrast. Multiplanar CT image reconstructions and MIPs were obtained to evaluate the vascular anatomy. RADIATION DOSE REDUCTION: This exam was performed according to the departmental dose-optimization program which includes automated exposure control, adjustment of the mA and/or kV according to patient size and/or use of iterative reconstruction technique. CONTRAST:  73m OMNIPAQUE IOHEXOL 350 MG/ML SOLN COMPARISON:  None Available. FINDINGS: Cardiovascular: Adequate opacification of the pulmonary arterial tree. No intraluminal filling defect identified to suggest acute pulmonary embolism. Central pulmonary arteries are of normal caliber. No significant coronary artery calcification. Cardiac size within normal limits. No pericardial effusion. No significant atherosclerotic calcification within the thoracic aorta. No aortic aneurysm. Mediastinum/Nodes: No enlarged mediastinal, hilar, or axillary lymph nodes. Thyroid gland, trachea, and esophagus demonstrate no significant findings. Lungs/Pleura: There  is mosaic attenuation of the pulmonary parenchyma in keeping with multifocal air trapping likely related to small airways disease. There is mild bronchial wall thickening noted in keeping with airway inflammation. There is extensive endobronchial debris with airway impaction within the lingula and lower lobes bilaterally suspicious for aspiration given the distribution. Infection and/or airway inflammation could appear similarly. There is marked narrowing of the right bronchus intermedius best seen on image # 68/5, in keeping with changes of bronchomalacia. No pneumothorax or pleural effusion. Upper Abdomen: No acute abnormality. Musculoskeletal: No acute bone abnormality Review of the MIP images confirms the above findings. IMPRESSION: 1. No pulmonary embolism. 2. Extensive endobronchial debris with airway impaction within the lingula and lower lobes bilaterally suspicious for aspiration given the distribution. Infection and/or airway inflammation could appear similarly. 3. Multifocal air trapping likely related to small airways disease. Bronchial wall thickening in keeping with large airway inflammation. 4. Marked  narrowing of the right bronchus intermedius in keeping with bronchomalacia. Electronically Signed   By: Fidela Salisbury M.D.   On: 10/13/2021 20:46   DG Chest 2 View  Result Date: 10/13/2021 CLINICAL DATA:  Shortness of breath and cough EXAM: CHEST - 2 VIEW COMPARISON:  08/29/2021 FINDINGS: Airway thickening is present, suggesting bronchitis or reactive airways disease. Cardiac and mediastinal margins appear normal. Indistinct opacity peripherally at the left lung base could be from atelectasis or pneumonia. There is some volume loss in this vicinity on the prior CT from 07/06/2021. The lungs appear otherwise clear.  Mild thoracic spondylosis. IMPRESSION: 1. Indistinct opacity peripherally at the left lung base, probably from atelectasis or scarring, less likely pneumonia. 2. Airway thickening is  present, suggesting bronchitis or reactive airways disease. Electronically Signed   By: Van Clines M.D.   On: 10/13/2021 15:56            LOS: 1 day       Emeterio Reeve, DO [facility] 10/14/2021, 2:54 PM   Staff may message me via secure chat in [place]  but this may not receive immediate response,  please page for urgent matters!  If 7PM-7AM, please contact night-coverage [email or web address]  Dictation software was used to generate the above note. Typos may occur and escape review, as with typed/written notes. Please contact Dr Sheppard Coil directly for clarity if needed.

## 2021-10-14 NOTE — Hospital Course (Addendum)
Nancy Patton is a 61 y.o. female with medical history significant of Asthma, ?OSA , Pulmonary HTN, obesity , HTN , DMII, BRCA  who presents to ed with acute on set of SOB that started hours PTA She states ever since being ill late last year she has had recurrent flare of sob each month with either hospitalizations or ED visits around 2 weeks s/p steroid taper 08/24: SpO2 97% on 3L. CXR: LLL opacity likely atelectasis/scar, bronchitis/RAD. CTA PE: no PE, extensive endobronchial debris with airway impaction within the lingula and lower lobes bilaterally suspicious for aspiration but infection and/or airway inflammation, multifocal air trapping, bronchial wall thickening, marked narrowing of the right bronchus intermedius ~bronchomalacia. Resp PCR neg. Tx solumedrol 125, nebs. Admitted to hospitalist service for Acute Asthma Exacerbation with hypoxemia, possible associated aspiration  pneumonitis. Started on azithromycin, contineu steroids/nebs/O2. Sputum Cx pending. Pulmonology consulted.    Consultants:  Pulmonology   Procedures: none    Subjective: Patient reports SOB is better at this time w/ breathing treatment, no CP, coughing is still present, slept better last night.      ASSESSMENT & PLAN:   Principal Problem:   Asthma exacerbation Active Problems:   Acute respiratory failure with hypoxia (HCC)   HTN (hypertension)   Asthmatic bronchitis with acute exacerbation   Type 2 diabetes mellitus (HCC)   GERD (gastroesophageal reflux disease)   Pulmonary hypertension (HCC)   Obesity   Hypokalemia  Acute Asthma Exacerbation with hypoxemia Possible associated aspiration pneumonitis  Acute on chronic airway inflammation nos  Resp panel negative for covid/ flu  Full respiratory panel neg SLP eval concern for reflux Sputum Cx ordered Inflammatory markers CRP WNL Continue azithromycin, doxycycline Continue IV steroids --> po Continue nebs Continue Breo + Incruse,  Singulair Continue Mucinex wean O2 as able  to baseline 2L O2 Pulmonology recs pending  ?OSA Pulmonary HTN since last hospitalization unable to f/u with pulmonary as outpatient Pulmonology recs pending  GERD PPI, Tums   Obesity complicates prognosis    HTN Home losartan, HCTZ    DMII SSI   Hx Breast CA followed by oncology     Hypokalemia  replete prn   Depression Continue home Celexa        DVT prophylaxis: heparin Pertinent IV fluids and/or nutrition: carb modified diet  Central lines / invasive devices: none  Code Status: FULL Family Communication: none at this time  Disposition Plan / TOC needs: inpatient, anticipate back to home Barriers to discharge / significant pending items: O2 requirements, asthma exacerbation, pending pulmonary recs  Expected date of discharge: 2-3 days pending clinical course

## 2021-10-14 NOTE — Consult Note (Signed)
PULMONOLOGY         Date: 10/14/2021,   MRN# [id number] Nancy Patton [birth year]     Admission                  Current   Referring provider: Dr Sheppard Coil   CHIEF COMPLAINT:   Acute on chronic hypoxemic respiratory failure   HISTORY OF PRESENT ILLNESS   This is 61 year old female with a history of breast cancer, pulmonary hypertension morbid obesity, essential hypertension, diabetes type 2 possible asthma with OSA overlap syndrome who came in for recurrent shortness of breath and respiratory distress and mild hypoxemia.  Status post hospitalization with asthma exacerbation and steroid taper.  She shares this time she experienced acute onset of shortness of breath denies flulike illness nausea vomiting diarrhea chills constitutional symptoms.  Does endorse uncontrolled reflux esophagitis, on arrival to the emergency room she required 3 L of oxygen to reach normoxia however was not tachycardic and did not have leukocytosis.  Her BMP was essentially normal without renal failure or electrolyte imbalances D-dimer was essentially unremarkable at 0.56.  She had CT chest performed after getting x-ray I reviewed both studies myself with below interpretation: Mosaic attenuation bilaterally with bronchiolitis suggestive of small airway disease such as asthma.   PAST MEDICAL HISTORY   Past Medical History:  Diagnosis Date   Arthritis    Asthma    Breast cancer ([place])    Diabetes mellitus without complication ([place])    Hypertension    Obesity    Personal history of radiation therapy    Pulmonary hypertension ([place])    Sleep apnea      SURGICAL HISTORY   Past Surgical History:  Procedure Laterality Date   BREAST LUMPECTOMY Right 05/23/2019   invasive DCIS radation    CATARACT EXTRACTION     CESAREAN SECTION     x 3      FAMILY HISTORY   Family History  Problem Relation Age of Onset   Diabetes Mother    Stroke Mother    Diabetes Father    Diabetes Brother     Cancer Neg Hx    Heart failure Neg Hx    Hypertension Neg Hx    Breast cancer Neg Hx      SOCIAL HISTORY   Social History   Tobacco Use   Smoking status: Former    Types: Cigarettes   Smokeless tobacco: Never   Tobacco comments:    Quit smoking 30 years ago.  Vaping Use   Vaping Use: Never used  Substance Use Topics   Alcohol use: No   Drug use: No     MEDICATIONS    Home Medication:  Current Outpatient Rx   Order #: [id number] Class: Historical Med   Order #: [id number] Class: Historical Med   Order #: [id number] Class: Historical Med   Order #: [id number] Class: Normal   Order #: [id number] Class: Historical Med   Order #: [id number] Class: Historical Med   Order #: [id number] Class: Historical Med   Order #: [id number] Class: Historical Med   Order #: [id number] Class: Historical Med   Order #: [id number] Class: Normal   Order #: [id number] Class: Historical Med   Order #: [id number] Class: Historical Med   Order #: [id number] Class: Historical Med   Order #: [id number] Class: Normal    Current Medication:  Current Facility-Administered Medications:    acetaminophen (TYLENOL) tablet 650 mg, 650 mg, Oral, Q6H PRN **OR** acetaminophen (TYLENOL) suppository 650 mg, 650 mg, Rectal, Q6H PRN, Clance Boll, MD  albuterol (PROVENTIL) (2.5 MG/3ML) 0.083% nebulizer solution 2.5 mg, 2.5 mg, Nebulization, Q2H PRN, Clance Boll, MD   calcium carbonate (TUMS - dosed in mg elemental calcium) chewable tablet 1,000 mg of elemental calcium, 5 tablet, Oral, TID WC, Myles Rosenthal A, MD, 1,000 mg of elemental calcium at 10/14/21 1630   citalopram (CELEXA) tablet 40 mg, 40 mg, Oral, Daily, Myles Rosenthal A, MD, 40 mg at 10/14/21 0817   doxycycline (VIBRA-TABS) tablet 100 mg, 100 mg, Oral, Q12H, Myles Rosenthal A, MD, 100 mg at 10/14/21 0819   fluticasone furoate-vilanterol (BREO ELLIPTA) 100-25 MCG/ACT 1 puff, 1 puff, Inhalation, Daily, 1 puff at 10/14/21 1637 **AND** umeclidinium  bromide (INCRUSE ELLIPTA) 62.5 MCG/ACT 1 puff, 1 puff, Inhalation, Daily, Myles Rosenthal A, MD, 1 puff at 10/14/21 1637   guaiFENesin (MUCINEX) 12 hr tablet 600 mg, 600 mg, Oral, BID, Myles Rosenthal A, MD, 600 mg at 10/14/21 0818   heparin injection 5,000 Units, 5,000 Units, Subcutaneous, Q8H, Clance Boll, MD, 5,000 Units at 10/14/21 1632   losartan (COZAAR) tablet 50 mg, 50 mg, Oral, Daily, 50 mg at 10/14/21 0818 **AND** hydrochlorothiazide (HYDRODIURIL) tablet 12.5 mg, 12.5 mg, Oral, Daily, Belue, Alver Sorrow, [name], 12.5 mg at 10/14/21 7353   insulin aspart (novoLOG) injection 0-15 Units, 0-15 Units, Subcutaneous, TID WC, Emeterio Reeve, DO, 2 Units at 10/14/21 1631   insulin aspart (novoLOG) injection 0-5 Units, 0-5 Units, Subcutaneous, QHS, Alexander, Natalie, DO   ipratropium-albuterol (DUONEB) 0.5-2.5 (3) MG/3ML nebulizer solution 3 mL, 3 mL, Nebulization, Q6H, Myles Rosenthal A, MD, 3 mL at 10/14/21 1632   letrozole [facility]) tablet 2.5 mg, 2.5 mg, Oral, Daily, Myles Rosenthal A, MD, 2.5 mg at 10/14/21 0941   methylPREDNISolone sodium succinate (SOLU-MEDROL) 40 mg/mL injection 40 mg, 40 mg, Intravenous, Q12H, 40 mg at 10/14/21 0815 **FOLLOWED BY** [START ON 10/15/2021] predniSONE (DELTASONE) tablet 40 mg, 40 mg, Oral, Q breakfast, Myles Rosenthal A, MD   montelukast (SINGULAIR) tablet 10 mg, 10 mg, Oral, q morning, Myles Rosenthal A, MD, 10 mg at 10/14/21 0818   ondansetron (ZOFRAN) tablet 4 mg, 4 mg, Oral, Q6H PRN **OR** ondansetron (ZOFRAN) injection 4 mg, 4 mg, Intravenous, Q6H PRN, Clance Boll, MD   pantoprazole (PROTONIX) EC tablet 20 mg, 20 mg, Oral, BID, Myles Rosenthal A, MD, 20 mg at 10/14/21 0940   Vitamin D (Ergocalciferol) (DRISDOL) 1.25 MG (50000 UNIT) capsule, , Oral, Weekly, Clance Boll, MD, 50,000 Units at 10/14/21 1632  Current Outpatient Medications:    calcium elemental as carbonate (CALCIUM ANTACID ULTRA) 400 MG chewable tablet, Chew  1,000 mg by mouth 3 (three) times daily., Disp: , Rfl:    citalopram (CELEXA) 40 MG tablet, Take 40 mg by mouth daily. , Disp: , Rfl: 10   D3-50 1.25 MG (50000 UT) capsule, Take by mouth once a week., Disp: , Rfl:    letrozole (FEMARA) 2.5 MG tablet, TAKE 1 TABLET BY MOUTH EVERY DAY (DON'T START MED UNTIL YOU COMPLETE RADIATION 1ST), Disp: 90 tablet, Rfl: 2   losartan-hydrochlorothiazide (HYZAAR) 50-12.5 MG per tablet, Take 1 tablet by mouth daily., Disp: , Rfl: 5   metFORMIN (GLUCOPHAGE) 1000 MG tablet, Take 1,000 mg by mouth 2 (two) times daily., Disp: , Rfl: 2   montelukast (SINGULAIR) 10 MG tablet, Take 10 mg by mouth every morning. , Disp: , Rfl:    MOUNJARO 5 MG/0.5ML Pen, Inject 5 mg into the skin once a week., Disp: , Rfl:    TRELEGY ELLIPTA 100-62.5-25 MCG/ACT AEPB,  Take 1 puff by mouth daily., Disp: , Rfl:    albuterol (VENTOLIN HFA) 108 (90 Base) MCG/ACT inhaler, Inhale 2 puffs into the lungs every 6 (six) hours as needed for wheezing or shortness of breath., Disp: 8 g, Rfl: 2   budesonide-formoterol (SYMBICORT) 160-4.5 MCG/ACT inhaler, Inhale 2 puffs into the lungs 2 (two) times daily. (Patient not taking: Reported on 07/06/2021), Disp: , Rfl:    ibuprofen (ADVIL) 800 MG tablet, Take 800 mg by mouth every 8 (eight) hours as needed., Disp: , Rfl:    ipratropium-albuterol (DUONEB) 0.5-2.5 (3) MG/3ML SOLN, Take by nebulization 3 (three) times daily as needed., Disp: , Rfl:    predniSONE (DELTASONE) 10 MG tablet, Take 1 tablet (10 mg total) by mouth daily. Day 1-3: take 4 tablets PO daily Day 4-6: take 3 tablets PO daily Day 7-9: take 2 tablets PO daily Day 10-12: take 1 tablet PO daily (Patient not taking: Reported on 10/13/2021), Disp: 30 tablet, Rfl: 0    ALLERGIES   Patient has no known allergies.     REVIEW OF SYSTEMS    Review of Systems:  Gen:  Denies  fever, sweats, chills weigh loss  HEENT: Denies blurred vision, double vision, ear pain, eye pain, hearing loss, nose  bleeds, sore throat Cardiac:  No dizziness, chest pain or heaviness, chest tightness,edema Resp:   reports dyspnea chronically  Gi: Denies swallowing difficulty, stomach pain, nausea or vomiting, diarrhea, constipation, bowel incontinence Gu:  Denies bladder incontinence, burning urine Ext:   Denies Joint pain, stiffness or swelling Skin: Denies  skin rash, easy bruising or bleeding or hives Endoc:  Denies polyuria, polydipsia , polyphagia or weight change Psych:   Denies depression, insomnia or hallucinations   Other:  All other systems negative   VS: BP 122/85 (BP Location: Left Arm)   Pulse 88   Temp 98.7 F (37.1 C) (Oral)   Resp 20   SpO2 93%      PHYSICAL EXAM    GENERAL:NAD, no fevers, chills, no weakness no fatigue HEAD: Normocephalic, atraumatic.  EYES: Pupils equal, round, reactive to light. Extraocular muscles intact. No scleral icterus.  MOUTH: Moist mucosal membrane. Dentition intact. No abscess noted.  EAR, NOSE, THROAT: Clear without exudates. No external lesions.  NECK: Supple. No thyromegaly. No nodules. No JVD.  PULMONARY: decreased breath sounds with mild rhonchi worse at bases bilaterally.  CARDIOVASCULAR: S1 and S2. Regular rate and rhythm. No murmurs, rubs, or gallops. No edema. Pedal pulses 2+ bilaterally.  GASTROINTESTINAL: Soft, nontender, nondistended. No masses. Positive bowel sounds. No hepatosplenomegaly.  MUSCULOSKELETAL: No swelling, clubbing, or edema. Range of motion full in all extremities.  NEUROLOGIC: Cranial nerves II through XII are intact. No gross focal neurological deficits. Sensation intact. Reflexes intact.  SKIN: No ulceration, lesions, rashes, or cyanosis. Skin warm and dry. Turgor intact.  PSYCHIATRIC: Mood, affect within normal limits. The patient is awake, alert and oriented x 3. Insight, judgment intact.       IMAGING  Reviewed CT chest and chest x-ray from 10/09/2021-  ASSESSMENT/PLAN   Acute on chronic hypoxemic  respiratory failure    -Most consistent with acute exacerbation of asthma, unclear secondary to want and differential includes viral lower respiratory tract infection versus medication noncompliance versus intrinsic inflammatory process of underlying reactive airway disease.  -Respiratory viral panel is negative -CRP done this morning is within reference range  -Arterial blood gas shows hypoxemia with PaO2 of 60 even on 3 L of supplemental oxygen -Procalcitonin is normal  and leukocytosis is absence pointing away from bacterial pneumonia -BNP is less than 10 -CMP with mild hypokalemia only. -Agree with prednisone 40 mg and doxycycline 100 twice daily empirically. -Agree with nebulizer DuoNeb therapy at this time -Rheumatoid factor, ANCA profile and ANA comprehensive profile to rule out autoimmune involvement. -Patient may have a variant form of asthma with atopic phenotype requiring dupilumab injection therapy and would benefit from proper outpatient workup.    Obstructive sleep apnea -Patient to use CPAP with settings per respiratory therapy and oxygen bleed in   Pulmonary hypertension -Query regarding Group 1 pulmonary arterial hypertension versus group 3 due to respiratory etiology such as asthma, patient is currently not on pulmonary vasodilators and would require right heart cath with vasoreactivity also on outpatient basis. -At this time NYHA/WHO class 3 -REVEAL registry    Thank you for allowing me to participate in the care of this patient.   Patient/Family are satisfied with care plan and all questions have been answered.    Provider disclosure: Patient with at least one acute or chronic illness or injury that poses a threat to life or bodily function and is being managed actively during this encounter.  All of the below services have been performed independently by signing provider:  review of prior documentation from internal and or external health records.  Review of previous  and current lab results.  Interview and comprehensive assessment during patient visit today. Review of current and previous chest radiographs/CT scans. Discussion of management and test interpretation with health care team and patient/family.   This document was prepared using Dragon voice recognition software and may include unintentional dictation errors.     Ottie Glazier, M.D.  Division of Pulmonary & Critical Care Medicine

## 2021-10-14 NOTE — Progress Notes (Signed)
PT denies CPAP use, states that she has never worn a CPAP. Hospital unit was offered but PT refused. Stable on 3L [place].

## 2021-10-15 DIAGNOSIS — J45901 Unspecified asthma with (acute) exacerbation: Secondary | ICD-10-CM | POA: Diagnosis not present

## 2021-10-15 DIAGNOSIS — K219 Gastro-esophageal reflux disease without esophagitis: Secondary | ICD-10-CM | POA: Diagnosis not present

## 2021-10-15 DIAGNOSIS — J4541 Moderate persistent asthma with (acute) exacerbation: Secondary | ICD-10-CM | POA: Diagnosis not present

## 2021-10-15 DIAGNOSIS — J9601 Acute respiratory failure with hypoxia: Secondary | ICD-10-CM | POA: Diagnosis not present

## 2021-10-15 LAB — GLUCOSE, CAPILLARY
Glucose-Capillary: 114 mg/dL — ABNORMAL HIGH (ref 70–99)
Glucose-Capillary: 143 mg/dL — ABNORMAL HIGH (ref 70–99)
Glucose-Capillary: 201 mg/dL — ABNORMAL HIGH (ref 70–99)
Glucose-Capillary: 157 mg/dL — ABNORMAL HIGH (ref 70–99)

## 2021-10-15 NOTE — Progress Notes (Signed)
PULMONOLOGY         Date: 10/15/2021,   MRN# [id number] Nancy Patton 1960-05-05     AdmissionWeight: 104.3 kg                 CurrentWeight: 104.3 kg  Referring provider: Dr Sheppard Coil   CHIEF COMPLAINT:   Acute on chronic hypoxemic respiratory failure   HISTORY OF PRESENT ILLNESS   This is 61 year old female with a history of breast cancer, pulmonary hypertension morbid obesity, essential hypertension, diabetes type 2 possible asthma with OSA overlap syndrome who came in for recurrent shortness of breath and respiratory distress and mild hypoxemia.  Status post hospitalization with asthma exacerbation and steroid taper.  She shares this time she experienced acute onset of shortness of breath denies flulike illness nausea vomiting diarrhea chills constitutional symptoms.  Does endorse uncontrolled reflux esophagitis, on arrival to the emergency room she required 3 L of oxygen to reach normoxia however was not tachycardic and did not have leukocytosis.  Her BMP was essentially normal without renal failure or electrolyte imbalances D-dimer was essentially unremarkable at 0.56.  She had CT chest performed after getting x-ray I reviewed both studies myself with below interpretation: Mosaic attenuation bilaterally with bronchiolitis suggestive of small airway disease such as asthma.  10/15/21 - patient improved, wheezing less, may initiate dc planning from pulm perspective   PAST MEDICAL HISTORY   Past Medical History:  Diagnosis Date   Arthritis    Asthma    Breast cancer ([place])    Diabetes mellitus without complication ([place])    Hypertension    Obesity    Personal history of radiation therapy    Pulmonary hypertension ([place])    Sleep apnea      SURGICAL HISTORY   Past Surgical History:  Procedure Laterality Date   BREAST LUMPECTOMY Right 05/23/2019   invasive DCIS radation    CATARACT EXTRACTION     CESAREAN SECTION     x 3      FAMILY HISTORY    Family History  Problem Relation Age of Onset   Diabetes Mother    Stroke Mother    Diabetes Father    Diabetes Brother    Cancer Neg Hx    Heart failure Neg Hx    Hypertension Neg Hx    Breast cancer Neg Hx      SOCIAL HISTORY   Social History   Tobacco Use   Smoking status: Former    Types: Cigarettes   Smokeless tobacco: Never   Tobacco comments:    Quit smoking 30 years ago.  Vaping Use   Vaping Use: Never used  Substance Use Topics   Alcohol use: No   Drug use: No     MEDICATIONS    Home Medication:     Current Medication:  Current Facility-Administered Medications:    acetaminophen (TYLENOL) tablet 650 mg, 650 mg, Oral, Q6H PRN **OR** acetaminophen (TYLENOL) suppository 650 mg, 650 mg, Rectal, Q6H PRN, Myles Rosenthal A, MD   albuterol (PROVENTIL) (2.5 MG/3ML) 0.083% nebulizer solution 2.5 mg, 2.5 mg, Nebulization, Q2H PRN, Myles Rosenthal A, MD   calcium carbonate (TUMS - dosed in mg elemental calcium) chewable tablet 1,000 mg of elemental calcium, 5 tablet, Oral, TID WC, Myles Rosenthal A, MD, 1,000 mg of elemental calcium at 10/14/21 1630   citalopram (CELEXA) tablet 40 mg, 40 mg, Oral, Daily, Myles Rosenthal A, MD, 40 mg at 10/15/21 0905   doxycycline (VIBRA-TABS) tablet 100 mg,  100 mg, Oral, Q12H, Myles Rosenthal A, MD, 100 mg at 10/15/21 0905   fluticasone furoate-vilanterol (BREO ELLIPTA) 100-25 MCG/ACT 1 puff, 1 puff, Inhalation, Daily, 1 puff at 10/15/21 0910 **AND** umeclidinium bromide (INCRUSE ELLIPTA) 62.5 MCG/ACT 1 puff, 1 puff, Inhalation, Daily, Myles Rosenthal A, MD, 1 puff at 10/15/21 0910   guaiFENesin (MUCINEX) 12 hr tablet 600 mg, 600 mg, Oral, BID, Myles Rosenthal A, MD, 600 mg at 10/15/21 0905   heparin injection 5,000 Units, 5,000 Units, Subcutaneous, Q8H, Clance Boll, MD, 5,000 Units at 10/15/21 1227   losartan (COZAAR) tablet 50 mg, 50 mg, Oral, Daily, 50 mg at 10/15/21 0905 **AND** hydrochlorothiazide  (HYDRODIURIL) tablet 12.5 mg, 12.5 mg, Oral, Daily, Belue, Alver Sorrow, [name], 12.5 mg at 10/15/21 0905   insulin aspart (novoLOG) injection 0-15 Units, 0-15 Units, Subcutaneous, TID WC, Emeterio Reeve, DO, 2 Units at 10/15/21 1226   insulin aspart (novoLOG) injection 0-5 Units, 0-5 Units, Subcutaneous, QHS, Alexander, Natalie, DO   ipratropium-albuterol (DUONEB) 0.5-2.5 (3) MG/3ML nebulizer solution 3 mL, 3 mL, Nebulization, Q6H, Myles Rosenthal A, MD, 3 mL at 10/15/21 1318   letrozole [facility]) tablet 2.5 mg, 2.5 mg, Oral, Daily, Myles Rosenthal A, MD, 2.5 mg at 10/15/21 0905   montelukast (SINGULAIR) tablet 10 mg, 10 mg, Oral, q morning, Myles Rosenthal A, MD, 10 mg at 10/15/21 0905   ondansetron (ZOFRAN) tablet 4 mg, 4 mg, Oral, Q6H PRN **OR** ondansetron (ZOFRAN) injection 4 mg, 4 mg, Intravenous, Q6H PRN, Clance Boll, MD   Oral care mouth rinse, 15 mL, Mouth Rinse, PRN, Emeterio Reeve, DO   pantoprazole (PROTONIX) EC tablet 20 mg, 20 mg, Oral, BID, Myles Rosenthal A, MD, 20 mg at 10/15/21 0905   [COMPLETED] methylPREDNISolone sodium succinate (SOLU-MEDROL) 40 mg/mL injection 40 mg, 40 mg, Intravenous, Q12H, 40 mg at 10/14/21 2030 **FOLLOWED BY** predniSONE (DELTASONE) tablet 40 mg, 40 mg, Oral, Q breakfast, Myles Rosenthal A, MD, 40 mg at 10/15/21 0240   Vitamin D (Ergocalciferol) (DRISDOL) 1.25 MG (50000 UNIT) capsule, , Oral, Weekly, Clance Boll, MD, 50,000 Units at 10/14/21 1632    ALLERGIES   Patient has no known allergies.     REVIEW OF SYSTEMS    Review of Systems:  Gen:  Denies  fever, sweats, chills weigh loss  HEENT: Denies blurred vision, double vision, ear pain, eye pain, hearing loss, nose bleeds, sore throat Cardiac:  No dizziness, chest pain or heaviness, chest tightness,edema Resp:   reports dyspnea chronically  Gi: Denies swallowing difficulty, stomach pain, nausea or vomiting, diarrhea, constipation, bowel incontinence Gu:  Denies  bladder incontinence, burning urine Ext:   Denies Joint pain, stiffness or swelling Skin: Denies  skin rash, easy bruising or bleeding or hives Endoc:  Denies polyuria, polydipsia , polyphagia or weight change Psych:   Denies depression, insomnia or hallucinations   Other:  All other systems negative   VS: BP 118/77 (BP Location: Left Arm)   Pulse 91   Temp 98.3 F (36.8 C) (Oral)   Resp 19   Ht '5\' 6"'$  (1.676 m)   Wt 104.3 kg   SpO2 97%   BMI 37.12 kg/m      PHYSICAL EXAM    GENERAL:NAD, no fevers, chills, no weakness no fatigue HEAD: Normocephalic, atraumatic.  EYES: Pupils equal, round, reactive to light. Extraocular muscles intact. No scleral icterus.  MOUTH: Moist mucosal membrane. Dentition intact. No abscess noted.  EAR, NOSE, THROAT: Clear without exudates. No external lesions.  NECK: Supple. No thyromegaly.  No nodules. No JVD.  PULMONARY: decreased breath sounds with mild rhonchi worse at bases bilaterally.  CARDIOVASCULAR: S1 and S2. Regular rate and rhythm. No murmurs, rubs, or gallops. No edema. Pedal pulses 2+ bilaterally.  GASTROINTESTINAL: Soft, nontender, nondistended. No masses. Positive bowel sounds. No hepatosplenomegaly.  MUSCULOSKELETAL: No swelling, clubbing, or edema. Range of motion full in all extremities.  NEUROLOGIC: Cranial nerves II through XII are intact. No gross focal neurological deficits. Sensation intact. Reflexes intact.  SKIN: No ulceration, lesions, rashes, or cyanosis. Skin warm and dry. Turgor intact.  PSYCHIATRIC: Mood, affect within normal limits. The patient is awake, alert and oriented x 3. Insight, judgment intact.       IMAGING  Reviewed CT chest and chest x-ray from 10/09/2021-  ASSESSMENT/PLAN   Acute on chronic hypoxemic respiratory failure    -Most consistent with acute exacerbation of asthma, unclear secondary to want and differential includes viral lower respiratory tract infection versus medication noncompliance versus  intrinsic inflammatory process of underlying reactive airway disease.  -Respiratory viral panel is negative -CRP done this morning is within reference range  -Arterial blood gas shows hypoxemia with PaO2 of 60 even on 3 L of supplemental oxygen -Procalcitonin is normal and leukocytosis is absence pointing away from bacterial pneumonia -BNP is less than 10 -CMP with mild hypokalemia only. -Agree with prednisone 40 mg and doxycycline 100 twice daily empirically. -Agree with nebulizer DuoNeb therapy at this time -Rheumatoid factor, ANCA profile and ANA comprehensive profile to rule out autoimmune involvement. -Patient may have a variant form of asthma with atopic phenotype requiring dupilumab injection therapy and would benefit from proper outpatient workup.    Obstructive sleep apnea -Patient to use CPAP with settings per respiratory therapy and oxygen bleed in   Pulmonary hypertension -Query regarding Group 1 pulmonary arterial hypertension versus group 3 due to respiratory etiology such as asthma, patient is currently not on pulmonary vasodilators and would require right heart cath with vasoreactivity also on outpatient basis. -At this time NYHA/WHO class 3 -REVEAL registry    Thank you for allowing me to participate in the care of this patient.   Patient/Family are satisfied with care plan and all questions have been answered.    Provider disclosure: Patient with at least one acute or chronic illness or injury that poses a threat to life or bodily function and is being managed actively during this encounter.  All of the below services have been performed independently by signing provider:  review of prior documentation from internal and or external health records.  Review of previous and current lab results.  Interview and comprehensive assessment during patient visit today. Review of current and previous chest radiographs/CT scans. Discussion of management and test interpretation with  health care team and patient/family.   This document was prepared using Dragon voice recognition software and may include unintentional dictation errors.     Ottie Glazier, M.D.  Division of Pulmonary & Critical Care Medicine

## 2021-10-15 NOTE — Progress Notes (Signed)
PROGRESS NOTE    Nancy Patton   RFF:[id number] DOB: 03/06/1960  DOA: 10/13/2021 Date of Service: 10/15/21 PCP: Perrin Maltese, MD     Brief Narrative / Hospital Course:  [name] is a 61 y.o. female with medical history significant of Asthma, ?OSA , Pulmonary HTN, obesity , HTN , DMII, BRCA  who presents to ed with acute on set of SOB that started hours PTA She states ever since being ill late last year she has had recurrent flare of sob each month with either hospitalizations or ED visits around 2 weeks s/p steroid taper 08/24-08/25: SpO2 97% on 3L. CXR: LLL opacity likely atelectasis/scar, bronchitis/RAD. CTA PE: no PE, extensive endobronchial debris with airway impaction within the lingula and lower lobes bilaterally suspicious for aspiration but infection and/or airway inflammation, multifocal air trapping, bronchial wall thickening, marked narrowing of the right bronchus intermedius ~bronchomalacia. Resp PCR neg. Tx solumedrol 125, nebs. Admitted to hospitalist service for Acute Asthma Exacerbation with hypoxemia, possible associated aspiration  pneumonitis. Started on azithromycin, contineu steroids/nebs/O2. Sputum Cx pending. Pulmonology consulted --> Agree with prednisone 40 mg and doxycycline 100 twice daily empirically. Obtain RF, ANCA, ANA, consider dupilimab outpatient. CPAP qhs, will need right heart cath with vasoreactivity also on outpatient basis 08/26: improving today but still wheezing and needing O2. Reports works in [employer] w/ lots of particulate irritants and fumes but will be changing jobs soon.    Consultants:  Pulmonology   Procedures: none    Subjective: Patient reports SOB is better today but still some coughing. Still on [place] O2 and comfortable on this.      ASSESSMENT & PLAN:   Principal Problem:   Asthma exacerbation Active Problems:   Acute respiratory failure with hypoxia (HCC)   HTN (hypertension)   Asthmatic bronchitis  with acute exacerbation   Type 2 diabetes mellitus (HCC)   GERD (gastroesophageal reflux disease)   Pulmonary hypertension (HCC)   Obesity   Hypokalemia  Acute Asthma Exacerbation with hypoxemia Possible associated aspiration pneumonitis  Acute on chronic airway inflammation nos  Resp panel negative for covid/ flu  Full respiratory panel neg, Inflammatory markers CRP WNL, Procal unlikely bacterial, BNP <10 SLP eval concern for reflux Sputum Cx ordered Continue azithromycin, doxycycline Continue IV steroids --> po Continue nebs Continue Breo + Incruse, Singulair Continue Mucinex wean O2 as able  to baseline 2L O2 Pulmonology recs: Agree with prednisone 40 mg and doxycycline 100 twice daily empirically. Obtain RF, ANCA, ANA, consider dupilimab outpatient.   ?OSA, Pulmonary HTN since last hospitalization unable to f/u with pulmonary as outpatient Pulmonology recs: CPAP qhs, will need right heart cath with vasoreactivity also on outpatient basis  GERD PPI, Tums   Obesity complicates prognosis    HTN Home losartan, HCTZ    DMII SSI   Hx Breast CA followed by oncology     Hypokalemia  replete prn   Depression Continue home Celexa        DVT prophylaxis: heparin Pertinent IV fluids and/or nutrition: carb modified diet  Central lines / invasive devices: none  Code Status: FULL Family Communication: none at this time  Disposition Plan / TOC needs: inpatient, anticipate back to home Barriers to discharge / significant pending items: O2 requirements, asthma exacerbation Expected date of discharge: 1-2 days pending clinical course            Objective: Vitals:   10/15/21 0742 10/15/21 0752 10/15/21 1145 10/15/21 1320  BP: (!) 148/85  Marland Kitchen)  144/83   Pulse: 86  89   Resp: 19  20   Temp: 98.3 F (36.8 C)  98.1 F (36.7 C)   TempSrc: Oral  Oral   SpO2: 97% 96% 96% 98%  Weight:      Height:        Intake/Output Summary (Last 24 hours) at 10/15/2021  1432 Last data filed at 10/15/2021 0900 Gross per 24 hour  Intake 600 ml  Output 0 ml  Net 600 ml   Filed Weights   10/14/21 1800  Weight: 104.3 kg    Examination:  Constitutional:  VS as above General Appearance: alert, well-developed, well-nourished, NAD Eyes: Normal lids and conjunctive, non-icteric sclera Ears, Nose, Mouth, Throat: Normal external appearance MMM Neck: No masses, trachea midline Respiratory: Normal respiratory effort (+)diffuse wheeze improved from yesterday  No rhonchi No rales Cardiovascular: S1/S2 normal No no murmur No rub/gallop auscultated No JVD No lower extremity edema Gastrointestinal: No tenderness No masses No hernia appreciated Musculoskeletal:  No clubbing/cyanosis of digits Symmetrical movement in all extremities Neurological: No cranial nerve deficit on limited exam Alert Psychiatric: Normal judgment/insight Normal mood and affect       Scheduled Medications:   calcium carbonate  5 tablet Oral TID WC   citalopram  40 mg Oral Daily   doxycycline  100 mg Oral Q12H   fluticasone furoate-vilanterol  1 puff Inhalation Daily   And   umeclidinium bromide  1 puff Inhalation Daily   guaiFENesin  600 mg Oral BID   heparin  5,000 Units Subcutaneous Q8H   losartan  50 mg Oral Daily   And   hydrochlorothiazide  12.5 mg Oral Daily   insulin aspart  0-15 Units Subcutaneous TID WC   insulin aspart  0-5 Units Subcutaneous QHS   ipratropium-albuterol  3 mL Nebulization Q6H   letrozole  2.5 mg Oral Daily   montelukast  10 mg Oral q morning   pantoprazole  20 mg Oral BID   predniSONE  40 mg Oral Q breakfast   Vitamin D (Ergocalciferol)   Oral Weekly    Continuous Infusions:   PRN Medications:  acetaminophen **OR** acetaminophen, albuterol, ondansetron **OR** ondansetron (ZOFRAN) IV, mouth rinse  Antimicrobials:  Anti-infectives (From admission, onward)    Start     Dose/Rate Route Frequency Ordered Stop   10/13/21 2300   doxycycline (VIBRA-TABS) tablet 100 mg        100 mg Oral Every 12 hours 10/13/21 2259 10/18/21 2159       Data Reviewed: I have personally reviewed following labs and imaging studies  CBC: Recent Labs  Lab 10/13/21 1542 10/14/21 0702  WBC 6.5 3.5*  NEUTROABS 2.8  --   HGB 14.1 13.2  HCT 43.1 39.7  MCV 86.2 84.8  PLT 248 196   Basic Metabolic Panel: Recent Labs  Lab 10/13/21 1542 10/14/21 0702  NA 139 140  K 3.3* 4.2  CL 105 108  CO2 24 26  GLUCOSE 83 181*  BUN 18 16  CREATININE 0.88 0.80  CALCIUM 9.4 9.3   GFR: Estimated Creatinine Clearance: 89 mL/min (by C-G formula based on SCr of 0.8 mg/dL). Liver Function Tests: Recent Labs  Lab 10/13/21 1542 10/14/21 0702  AST 29 23  ALT 18 17  ALKPHOS 53 54  BILITOT 0.9 0.8  PROT 7.3 7.0  ALBUMIN 4.5 4.0   No results for input(s): "LIPASE", "AMYLASE" in the last 168 hours. No results for input(s): "AMMONIA" in the last 168 hours. Coagulation Profile:  No results for input(s): "INR", "PROTIME" in the last 168 hours. Cardiac Enzymes: No results for input(s): "CKTOTAL", "CKMB", "CKMBINDEX", "TROPONINI" in the last 168 hours. BNP (last 3 results) No results for input(s): "PROBNP" in the last 8760 hours. HbA1C: Recent Labs    10/14/21 0702  HGBA1C 5.6   CBG: Recent Labs  Lab 10/14/21 1140 10/14/21 1613 10/14/21 2206 10/15/21 0741 10/15/21 1148  GLUCAP 167* 141* 131* 114* 143*   Lipid Profile: No results for input(s): "CHOL", "HDL", "LDLCALC", "TRIG", "CHOLHDL", "LDLDIRECT" in the last 72 hours. Thyroid Function Tests: No results for input(s): "TSH", "T4TOTAL", "FREET4", "T3FREE", "THYROIDAB" in the last 72 hours. Anemia Panel: No results for input(s): "VITAMINB12", "FOLATE", "FERRITIN", "TIBC", "IRON", "RETICCTPCT" in the last 72 hours. Urine analysis:    Component Value Date/Time   COLORURINE YELLOW (A) 07/06/2021 0352   APPEARANCEUR HAZY (A) 07/06/2021 0352   LABSPEC 1.031 (H) 07/06/2021 0352    PHURINE 5.0 07/06/2021 0352   GLUCOSEU NEGATIVE 07/06/2021 0352   HGBUR NEGATIVE 07/06/2021 0352   BILIRUBINUR NEGATIVE 07/06/2021 0352   KETONESUR 20 (A) 07/06/2021 0352   PROTEINUR NEGATIVE 07/06/2021 0352   NITRITE NEGATIVE 07/06/2021 0352   LEUKOCYTESUR MODERATE (A) 07/06/2021 0352   Sepsis Labs: '@LABRCNTIP' (procalcitonin:4,lacticidven:4)  Recent Results (from the past 240 hour(s))  Resp Panel by RT-PCR (Flu A&B, Covid) Anterior Nasal Swab     Status: None   Collection Time: 10/13/21  5:53 PM   Specimen: Anterior Nasal Swab  Result Value Ref Range Status   SARS Coronavirus 2 by RT PCR NEGATIVE NEGATIVE Final    Comment: (NOTE) SARS-CoV-2 target nucleic acids are NOT DETECTED.  The SARS-CoV-2 RNA is generally detectable in upper respiratory specimens during the acute phase of infection. The lowest concentration of SARS-CoV-2 viral copies this assay can detect is 138 copies/mL. A negative result does not preclude SARS-Cov-2 infection and should not be used as the sole basis for treatment or other patient management decisions. A negative result may occur with  improper specimen collection/handling, submission of specimen other than nasopharyngeal swab, presence of viral mutation(s) within the areas targeted by this assay, and inadequate number of viral copies(<138 copies/mL). A negative result must be combined with clinical observations, patient history, and epidemiological information. The expected result is Negative.  Fact Sheet for Patients:  EntrepreneurPulse.com.au  Fact Sheet for Healthcare Providers:  IncredibleEmployment.be  This test is no t yet approved or cleared by the [place] FDA and  has been authorized for detection and/or diagnosis of SARS-CoV-2 by FDA under an Emergency Use Authorization (EUA). This EUA will remain  in effect (meaning this test can be used) for the duration of the COVID-19 declaration under Section  564(b)(1) of the Act, 21 U.S.C.section 360bbb-3(b)(1), unless the authorization is terminated  or revoked sooner.       Influenza A by PCR NEGATIVE NEGATIVE Final   Influenza B by PCR NEGATIVE NEGATIVE Final    Comment: (NOTE) The Xpert Xpress SARS-CoV-2/FLU/RSV plus assay is intended as an aid in the diagnosis of influenza from Nasopharyngeal swab specimens and should not be used as a sole basis for treatment. Nasal washings and aspirates are unacceptable for Xpert Xpress SARS-CoV-2/FLU/RSV testing.  Fact Sheet for Patients: EntrepreneurPulse.com.au  Fact Sheet for Healthcare Providers: IncredibleEmployment.be  This test is not yet approved or cleared by the [place] FDA and has been authorized for detection and/or diagnosis of SARS-CoV-2 by FDA under an Emergency Use Authorization (EUA). This EUA will remain in effect (meaning this  test can be used) for the duration of the COVID-19 declaration under Section 564(b)(1) of the Act, 21 U.S.C. section 360bbb-3(b)(1), unless the authorization is terminated or revoked.  Performed at [facility], [place], [place] [address]   Respiratory (~20 pathogens) panel by PCR     Status: None   Collection Time: 10/14/21  7:09 AM   Specimen: Nasopharyngeal Swab; Respiratory  Result Value Ref Range Status   Adenovirus NOT DETECTED NOT DETECTED Final   Coronavirus 229E NOT DETECTED NOT DETECTED Final    Comment: (NOTE) The Coronavirus on the Respiratory Panel, DOES NOT test for the novel  Coronavirus (2019 nCoV)    Coronavirus HKU1 NOT DETECTED NOT DETECTED Final   Coronavirus NL63 NOT DETECTED NOT DETECTED Final   Coronavirus OC43 NOT DETECTED NOT DETECTED Final   Metapneumovirus NOT DETECTED NOT DETECTED Final   Rhinovirus / Enterovirus NOT DETECTED NOT DETECTED Final   Influenza A NOT DETECTED NOT DETECTED Final   Influenza B NOT DETECTED NOT DETECTED Final    Parainfluenza Virus 1 NOT DETECTED NOT DETECTED Final   Parainfluenza Virus 2 NOT DETECTED NOT DETECTED Final   Parainfluenza Virus 3 NOT DETECTED NOT DETECTED Final   Parainfluenza Virus 4 NOT DETECTED NOT DETECTED Final   Respiratory Syncytial Virus NOT DETECTED NOT DETECTED Final   Bordetella pertussis NOT DETECTED NOT DETECTED Final   Bordetella Parapertussis NOT DETECTED NOT DETECTED Final   Chlamydophila pneumoniae NOT DETECTED NOT DETECTED Final   Mycoplasma pneumoniae NOT DETECTED NOT DETECTED Final    Comment: Performed at [facility], [place]. [address], [place], [place] [address]  MRSA Next Gen by PCR, Nasal     Status: None   Collection Time: 10/14/21  7:09 AM   Specimen: Nasopharyngeal Swab; Nasal Swab  Result Value Ref Range Status   MRSA by PCR Next Gen NOT DETECTED NOT DETECTED Final    Comment: (NOTE) The GeneXpert MRSA Assay (FDA approved for NASAL specimens only), is one component of a comprehensive MRSA colonization surveillance program. It is not intended to diagnose MRSA infection nor to guide or monitor treatment for MRSA infections. Test performance is not FDA approved in patients less than 86 years old. Performed at [facility], [address] [place],  [address]          Radiology Studies: CT Angio Chest PE W and/or Wo Contrast  Result Date: 10/13/2021 CLINICAL DATA:  Dyspnea, asthma EXAM: CT ANGIOGRAPHY CHEST WITH CONTRAST TECHNIQUE: Multidetector CT imaging of the chest was performed using the standard protocol during bolus administration of intravenous contrast. Multiplanar CT image reconstructions and MIPs were obtained to evaluate the vascular anatomy. RADIATION DOSE REDUCTION: This exam was performed according to the departmental dose-optimization program which includes automated exposure control, adjustment of the mA and/or kV according to patient size and/or use of iterative reconstruction technique. CONTRAST:  82m OMNIPAQUE  IOHEXOL 350 MG/ML SOLN COMPARISON:  None Available. FINDINGS: Cardiovascular: Adequate opacification of the pulmonary arterial tree. No intraluminal filling defect identified to suggest acute pulmonary embolism. Central pulmonary arteries are of normal caliber. No significant coronary artery calcification. Cardiac size within normal limits. No pericardial effusion. No significant atherosclerotic calcification within the thoracic aorta. No aortic aneurysm. Mediastinum/Nodes: No enlarged mediastinal, hilar, or axillary lymph nodes. Thyroid gland, trachea, and esophagus demonstrate no significant findings. Lungs/Pleura: There is mosaic attenuation of the pulmonary parenchyma in keeping with multifocal air trapping likely related to small airways disease. There is mild bronchial  wall thickening noted in keeping with airway inflammation. There is extensive endobronchial debris with airway impaction within the lingula and lower lobes bilaterally suspicious for aspiration given the distribution. Infection and/or airway inflammation could appear similarly. There is marked narrowing of the right bronchus intermedius best seen on image # 68/5, in keeping with changes of bronchomalacia. No pneumothorax or pleural effusion. Upper Abdomen: No acute abnormality. Musculoskeletal: No acute bone abnormality Review of the MIP images confirms the above findings. IMPRESSION: 1. No pulmonary embolism. 2. Extensive endobronchial debris with airway impaction within the lingula and lower lobes bilaterally suspicious for aspiration given the distribution. Infection and/or airway inflammation could appear similarly. 3. Multifocal air trapping likely related to small airways disease. Bronchial wall thickening in keeping with large airway inflammation. 4. Marked narrowing of the right bronchus intermedius in keeping with bronchomalacia. Electronically Signed   By: Fidela Salisbury M.D.   On: 10/13/2021 20:46   DG Chest 2 View  Result Date:  10/13/2021 CLINICAL DATA:  Shortness of breath and cough EXAM: CHEST - 2 VIEW COMPARISON:  08/29/2021 FINDINGS: Airway thickening is present, suggesting bronchitis or reactive airways disease. Cardiac and mediastinal margins appear normal. Indistinct opacity peripherally at the left lung base could be from atelectasis or pneumonia. There is some volume loss in this vicinity on the prior CT from 07/06/2021. The lungs appear otherwise clear.  Mild thoracic spondylosis. IMPRESSION: 1. Indistinct opacity peripherally at the left lung base, probably from atelectasis or scarring, less likely pneumonia. 2. Airway thickening is present, suggesting bronchitis or reactive airways disease. Electronically Signed   By: Van Clines M.D.   On: 10/13/2021 15:56            LOS: 2 days       Emeterio Reeve, DO [facility] 10/15/2021, 2:32 PM   Staff may message me via secure chat in [place]  but this may not receive immediate response,  please page for urgent matters!  If 7PM-7AM, please contact night-coverage [email or web address]  Dictation software was used to generate the above note. Typos may occur and escape review, as with typed/written notes. Please contact Dr Sheppard Coil directly for clarity if needed.

## 2021-10-16 DIAGNOSIS — K219 Gastro-esophageal reflux disease without esophagitis: Secondary | ICD-10-CM | POA: Diagnosis not present

## 2021-10-16 DIAGNOSIS — J45901 Unspecified asthma with (acute) exacerbation: Secondary | ICD-10-CM | POA: Diagnosis not present

## 2021-10-16 DIAGNOSIS — J4541 Moderate persistent asthma with (acute) exacerbation: Secondary | ICD-10-CM | POA: Diagnosis not present

## 2021-10-16 DIAGNOSIS — J9601 Acute respiratory failure with hypoxia: Secondary | ICD-10-CM | POA: Diagnosis not present

## 2021-10-16 LAB — GLUCOSE, CAPILLARY: Glucose-Capillary: 96 mg/dL (ref 70–99)

## 2021-10-16 LAB — RHEUMATOID FACTOR: Rheumatoid fact SerPl-aCnc: <10 IU/mL (ref <14.0)

## 2021-10-16 MED ORDER — PANTOPRAZOLE SODIUM 40 MG PO TBEC: 40.0000 mg | DELAYED_RELEASE_TABLET | Freq: Every day | ORAL | 0 refills | Status: DC

## 2021-10-16 MED ORDER — ALBUTEROL SULFATE HFA 108 (90 BASE) MCG/ACT IN AERS: 2.0000 | INHALATION_SPRAY | Freq: Four times a day (QID) | RESPIRATORY_TRACT | 0 refills | Status: AC | PRN

## 2021-10-16 MED ORDER — DOXYCYCLINE HYCLATE 100 MG PO TABS: 100.0000 mg | ORAL_TABLET | Freq: Two times a day (BID) | ORAL | 0 refills | Status: AC

## 2021-10-16 MED ORDER — IPRATROPIUM-ALBUTEROL 0.5-2.5 (3) MG/3ML IN SOLN: 3.0000 mL | RESPIRATORY_TRACT | 0 refills | Status: DC | PRN

## 2021-10-16 MED ORDER — GUAIFENESIN ER 600 MG PO TB12: 600.0000 mg | ORAL_TABLET | Freq: Two times a day (BID) | ORAL | 0 refills | Status: DC | PRN

## 2021-10-16 MED ORDER — TRELEGY ELLIPTA 100-62.5-25 MCG/ACT IN AEPB: 1.0000 | INHALATION_SPRAY | Freq: Every day | RESPIRATORY_TRACT | 0 refills | Status: AC

## 2021-10-16 MED ORDER — MONTELUKAST SODIUM 10 MG PO TABS: 10.0000 mg | ORAL_TABLET | Freq: Every morning | ORAL | 0 refills | Status: DC

## 2021-10-16 MED ORDER — PREDNISONE 20 MG PO TABS: 40.0000 mg | ORAL_TABLET | Freq: Every day | ORAL | 0 refills | Status: AC

## 2021-10-16 NOTE — Progress Notes (Signed)
Discharged, all belongings with pt.

## 2021-10-16 NOTE — Plan of Care (Addendum)
Tolerated ambulating in hallway on room air last night without respiratory distress.  Oxygen saturations maintained >90%.

## 2021-10-16 NOTE — Discharge Summary (Addendum)
Physician Discharge Summary   Patient: Nancy Patton MRN: [id number]  DOB: 1960/12/04   Admit:     Date of Admission: 10/13/2021 Admitted from: home   Discharge: Date of discharge: 10/16/21 Disposition: Home Condition at discharge: good  CODE STATUS: FULL     Discharge Physician: Emeterio Reeve, DO [facility]     PCP: Perrin Maltese, MD  Recommendations for Outpatient Follow-up:  Follow up with PCP Perrin Maltese, MD in 1-2 weeks FOllow up w/ pulmonology as directed Dr Lanney Gins  Follow for RF, ANCA, ANA results consider dupilimab outpatient.  may benefit from sleep study --> CPAP qhs for likely OSA will need right heart cath with vasoreactivity also on outpatient basis for pulm HTN May also benefit from GI consult if GERD is resistant to conservative treatment  PCP AND OTHER OUTPATIENT PROVIDERS: SEE BELOW FOR SPECIFIC DISCHARGE INSTRUCTIONS PRINTED FOR PATIENT IN ADDITION TO GENERIC AVS PATIENT INFO  Discharge Instructions     Diet - low sodium heart healthy   Complete by: As directed    Diet Carb Modified   Complete by: As directed    Discharge instructions   Complete by: As directed    See below for Dr Teodoro Kil information - please call his clinic if you don't hear from them tomorrow about scheduling a follow-up   Increase activity slowly   Complete by: As directed          Discharge Diagnoses: Principal Problem:   Asthma exacerbation Active Problems:   Acute respiratory failure with hypoxia ([place])   HTN (hypertension)   Asthmatic bronchitis with acute exacerbation   Type 2 diabetes mellitus ([place])   GERD (gastroesophageal reflux disease)   Pulmonary hypertension ([place])   Obesity   Hypokalemia   HOSPITAL COURSE: Nancy Patton is a 61 y.o. female with medical history significant of Asthma, ?OSA , Pulmonary HTN, obesity , HTN , DMII, BRCA  who presents to ed with acute on set of SOB that started hours PTA She states  ever since being ill late last year she has had recurrent flare of sob each month with either hospitalizations or ED visits around 2 weeks s/p steroid taper 08/24-08/25: SpO2 97% on 3L. CXR: LLL opacity likely atelectasis/scar, bronchitis/RAD. CTA PE: no PE, extensive endobronchial debris with airway impaction within the lingula and lower lobes bilaterally suspicious for aspiration but infection and/or airway inflammation, multifocal air trapping, bronchial wall thickening, marked narrowing of the right bronchus intermedius ~bronchomalacia. Resp PCR neg. Tx solumedrol 125, nebs. Admitted to hospitalist service for Acute Asthma Exacerbation with hypoxemia, possible associated aspiration  pneumonitis. Started on azithromycin, contineu steroids/nebs/O2. Sputum Cx pending. Pulmonology consulted --> Agree with prednisone 40 mg and doxycycline 100 twice daily empirically. Obtain RF, ANCA, ANA, consider dupilimab outpatient. CPAP qhs, will need right heart cath with vasoreactivity also on outpatient basis 08/26: improving today but still wheezing and needing O2. Reports works in [employer] w/ lots of particulate irritants and fumes but will be changing jobs soon. Work note written.  08/27: on RA this morning and doing well    Consultants:  Pulmonology   Procedures: none    Subjective: Patient reports breathing much better today, no concerns      ASSESSMENT & PLAN:   Principal Problem:   Asthma exacerbation Active Problems:   Acute respiratory failure with hypoxia ([facility])   HTN (hypertension)   Asthmatic bronchitis with acute exacerbation   Type 2 diabetes mellitus ([place])  GERD (gastroesophageal reflux disease)   Pulmonary hypertension ([facility])   Obesity   Hypokalemia  Acute Asthma Exacerbation with hypoxemia Possible associated aspiration pneumonitis  Acute on chronic airway inflammation nos  Resp panel negative for covid/ flu  Full respiratory panel neg, Inflammatory markers CRP WNL,  Procal unlikely bacterial, BNP <10 SLP eval concern for reflux --> PPI   Sputum Cx ordered  Continue doxycycline --> 2 more days for 5 days total  Continue IV steroids --> po  Continue nebs as needed at home  Continue Breo + Incruse OR Trelegy, Singulair Continue Mucinex prn wean O2 as able --> on RA today  Pulmonology recs: prednisone 40 mg and doxycycline 100 twice daily empirically. Obtain RF, ANCA, ANA, consider dupilimab outpatient.   ?OSA, ?Pulmonary HTN since last hospitalization unable to f/u with pulmonary as outpatient Pulmonology recs: CPAP qhs for OSA, will need right heart cath with vasoreactivity also on outpatient basis for pulm HTN  GERD PPI, Tums   Obesity complicates prognosis    HTN Home losartan, HCTZ    DMII SSI   Hx Breast CA followed by oncology     Hypokalemia  replete prn   Depression Continue home Celexa        Discharge Instructions  Allergies as of 10/16/2021   No Known Allergies      Medication List     STOP taking these medications    budesonide-formoterol 160-4.5 MCG/ACT inhaler Commonly known as: SYMBICORT       TAKE these medications    albuterol 108 (90 Base) MCG/ACT inhaler Commonly known as: VENTOLIN HFA Inhale 2 puffs into the lungs every 6 (six) hours as needed for wheezing or shortness of breath.   Calcium Antacid Ultra 400 MG chewable tablet Generic drug: calcium elemental as carbonate Chew 1,000 mg by mouth 3 (three) times daily.   citalopram 40 MG tablet Commonly known as: CELEXA Take 40 mg by mouth daily.   D3-50 1.25 MG (50000 UT) capsule Generic drug: Cholecalciferol Take by mouth once a week.   doxycycline 100 MG tablet Commonly known as: VIBRA-TABS Take 1 tablet (100 mg total) by mouth every 12 (twelve) hours for 3 doses.   guaiFENesin 600 MG 12 hr tablet Commonly known as: MUCINEX Take 1-2 tablets (600-1,200 mg total) by mouth 2 (two) times daily as needed.   ibuprofen 800 MG  tablet Commonly known as: ADVIL Take 800 mg by mouth every 8 (eight) hours as needed.   ipratropium-albuterol 0.5-2.5 (3) MG/3ML Soln Commonly known as: DUONEB Take 3 mLs by nebulization every 4 (four) hours as needed (shortness of breath / wheeze). What changed:  how much to take when to take this reasons to take this   letrozole 2.5 MG tablet Commonly known as: FEMARA TAKE 1 TABLET BY MOUTH EVERY DAY (DON'T START MED UNTIL YOU COMPLETE RADIATION 1ST)   losartan-hydrochlorothiazide 50-12.5 MG tablet Commonly known as: HYZAAR Take 1 tablet by mouth daily.   metFORMIN 1000 MG tablet Commonly known as: GLUCOPHAGE Take 1,000 mg by mouth 2 (two) times daily.   montelukast 10 MG tablet Commonly known as: SINGULAIR Take 1 tablet (10 mg total) by mouth every morning.   Mounjaro 5 MG/0.5ML Pen Generic drug: tirzepatide Inject 5 mg into the skin once a week.   pantoprazole 40 MG tablet Commonly known as: PROTONIX Take 1 tablet (40 mg total) by mouth daily.   predniSONE 20 MG tablet Commonly known as: DELTASONE Take 2 tablets (40 mg total) by mouth daily  with breakfast for 2 days. Start taking on: October 17, 2021 What changed:  medication strength how much to take when to take this additional instructions   Trelegy Ellipta 100-62.5-25 MCG/ACT Aepb Generic drug: Fluticasone-Umeclidin-Vilant Take 1 puff by mouth daily.         Follow-up Information     Ottie Glazier, MD. Call in 1 day(s).   Specialty: Pulmonary Disease Why: for follow up asthma and breathing issues Contact information: [place] [address] [phone]                 No Known Allergies     Discharge Exam: BP 105/63 (BP Location: Left Arm)   Pulse 75   Temp 98 F (36.7 C)   Resp 19   Ht '5\' 6"'  (1.676 m)   Wt 106.5 kg   SpO2 97%   BMI 37.90 kg/m  General: Pt is alert, awake, not in acute distress Cardiovascular: RRR, S1/S2 +, no rubs, no  gallops Respiratory: CTA bilaterally, some wheezing but improved significantly, no rhonchi Abdominal: Soft, NT, ND, bowel sounds + Extremities: no edema, no cyanosis     The results of significant diagnostics from this hospitalization (including imaging, microbiology, ancillary and laboratory) are listed below for reference.     Microbiology: Recent Results (from the past 240 hour(s))  Resp Panel by RT-PCR (Flu A&B, Covid) Anterior Nasal Swab     Status: None   Collection Time: 10/13/21  5:53 PM   Specimen: Anterior Nasal Swab  Result Value Ref Range Status   SARS Coronavirus 2 by RT PCR NEGATIVE NEGATIVE Final    Comment: (NOTE) SARS-CoV-2 target nucleic acids are NOT DETECTED.  The SARS-CoV-2 RNA is generally detectable in upper respiratory specimens during the acute phase of infection. The lowest concentration of SARS-CoV-2 viral copies this assay can detect is 138 copies/mL. A negative result does not preclude SARS-Cov-2 infection and should not be used as the sole basis for treatment or other patient management decisions. A negative result may occur with  improper specimen collection/handling, submission of specimen other than nasopharyngeal swab, presence of viral mutation(s) within the areas targeted by this assay, and inadequate number of viral copies(<138 copies/mL). A negative result must be combined with clinical observations, patient history, and epidemiological information. The expected result is Negative.  Fact Sheet for Patients:  EntrepreneurPulse.com.au  Fact Sheet for Healthcare Providers:  IncredibleEmployment.be  This test is no t yet approved or cleared by the [place] FDA and  has been authorized for detection and/or diagnosis of SARS-CoV-2 by FDA under an Emergency Use Authorization (EUA). This EUA will remain  in effect (meaning this test can be used) for the duration of the COVID-19 declaration under Section  564(b)(1) of the Act, 21 U.S.C.section 360bbb-3(b)(1), unless the authorization is terminated  or revoked sooner.       Influenza A by PCR NEGATIVE NEGATIVE Final   Influenza B by PCR NEGATIVE NEGATIVE Final    Comment: (NOTE) The Xpert Xpress SARS-CoV-2/FLU/RSV plus assay is intended as an aid in the diagnosis of influenza from Nasopharyngeal swab specimens and should not be used as a sole basis for treatment. Nasal washings and aspirates are unacceptable for Xpert Xpress SARS-CoV-2/FLU/RSV testing.  Fact Sheet for Patients: EntrepreneurPulse.com.au  Fact Sheet for Healthcare Providers: IncredibleEmployment.be  This test is not yet approved or cleared by the [place] FDA and has been authorized for detection and/or diagnosis of SARS-CoV-2 by FDA under an Emergency Use Authorization (EUA).  This EUA will remain in effect (meaning this test can be used) for the duration of the COVID-19 declaration under Section 564(b)(1) of the Act, 21 U.S.C. section 360bbb-3(b)(1), unless the authorization is terminated or revoked.  Performed at [facility], [place], [place] [address]   Respiratory (~20 pathogens) panel by PCR     Status: None   Collection Time: 10/14/21  7:09 AM   Specimen: Nasopharyngeal Swab; Respiratory  Result Value Ref Range Status   Adenovirus NOT DETECTED NOT DETECTED Final   Coronavirus 229E NOT DETECTED NOT DETECTED Final    Comment: (NOTE) The Coronavirus on the Respiratory Panel, DOES NOT test for the novel  Coronavirus (2019 nCoV)    Coronavirus HKU1 NOT DETECTED NOT DETECTED Final   Coronavirus NL63 NOT DETECTED NOT DETECTED Final   Coronavirus OC43 NOT DETECTED NOT DETECTED Final   Metapneumovirus NOT DETECTED NOT DETECTED Final   Rhinovirus / Enterovirus NOT DETECTED NOT DETECTED Final   Influenza A NOT DETECTED NOT DETECTED Final   Influenza B NOT DETECTED NOT DETECTED Final    Parainfluenza Virus 1 NOT DETECTED NOT DETECTED Final   Parainfluenza Virus 2 NOT DETECTED NOT DETECTED Final   Parainfluenza Virus 3 NOT DETECTED NOT DETECTED Final   Parainfluenza Virus 4 NOT DETECTED NOT DETECTED Final   Respiratory Syncytial Virus NOT DETECTED NOT DETECTED Final   Bordetella pertussis NOT DETECTED NOT DETECTED Final   Bordetella Parapertussis NOT DETECTED NOT DETECTED Final   Chlamydophila pneumoniae NOT DETECTED NOT DETECTED Final   Mycoplasma pneumoniae NOT DETECTED NOT DETECTED Final    Comment: Performed at [facility], [place]. [address] [place], [place] [address]  MRSA Next Gen by PCR, Nasal     Status: None   Collection Time: 10/14/21  7:09 AM   Specimen: Nasopharyngeal Swab; Nasal Swab  Result Value Ref Range Status   MRSA by PCR Next Gen NOT DETECTED NOT DETECTED Final    Comment: (NOTE) The GeneXpert MRSA Assay (FDA approved for NASAL specimens only), is one component of a comprehensive MRSA colonization surveillance program. It is not intended to diagnose MRSA infection nor to guide or monitor treatment for MRSA infections. Test performance is not FDA approved in patients less than 23 years old. Performed at [facility], [address] [place], [place] [address]      Labs: BNP (last 3 results) Recent Labs    07/05/21 2250 08/29/21 0703 10/13/21 1542  BNP 8.1 18.6 9.4   Basic Metabolic Panel: Recent Labs  Lab 10/13/21 1542 10/14/21 0702  NA 139 140  K 3.3* 4.2  CL 105 108  CO2 24 26  GLUCOSE 83 181*  BUN 18 16  CREATININE 0.88 0.80  CALCIUM 9.4 9.3   Liver Function Tests: Recent Labs  Lab 10/13/21 1542 10/14/21 0702  AST 29 23  ALT 18 17  ALKPHOS 53 54  BILITOT 0.9 0.8  PROT 7.3 7.0  ALBUMIN 4.5 4.0   No results for input(s): "LIPASE", "AMYLASE" in the last 168 hours. No results for input(s): "AMMONIA" in the last 168 hours. CBC: Recent Labs  Lab 10/13/21 1542 10/14/21 0702  WBC 6.5 3.5*  NEUTROABS 2.8   --   HGB 14.1 13.2  HCT 43.1 39.7  MCV 86.2 84.8  PLT 248 216   Cardiac Enzymes: No results for input(s): "CKTOTAL", "CKMB", "CKMBINDEX", "TROPONINI" in the last 168 hours. BNP: Invalid input(s): "POCBNP" CBG: Recent Labs  Lab 10/15/21 0741 10/15/21 1148 10/15/21  1608 10/15/21 2113 10/16/21 0746  GLUCAP 114* 143* 201* 157* 96   D-Dimer Recent Labs    10/13/21 1911  DDIMER 0.56*   Hgb A1c Recent Labs    10/14/21 0702  HGBA1C 5.6   Lipid Profile No results for input(s): "CHOL", "HDL", "LDLCALC", "TRIG", "CHOLHDL", "LDLDIRECT" in the last 72 hours. Thyroid function studies No results for input(s): "TSH", "T4TOTAL", "T3FREE", "THYROIDAB" in the last 72 hours.  Invalid input(s): "FREET3" Anemia work up No results for input(s): "VITAMINB12", "FOLATE", "FERRITIN", "TIBC", "IRON", "RETICCTPCT" in the last 72 hours. Urinalysis    Component Value Date/Time   COLORURINE YELLOW (A) 07/06/2021 0352   APPEARANCEUR HAZY (A) 07/06/2021 0352   LABSPEC 1.031 (H) 07/06/2021 0352   PHURINE 5.0 07/06/2021 0352   GLUCOSEU NEGATIVE 07/06/2021 0352   HGBUR NEGATIVE 07/06/2021 0352   BILIRUBINUR NEGATIVE 07/06/2021 0352   KETONESUR 20 (A) 07/06/2021 0352   PROTEINUR NEGATIVE 07/06/2021 0352   NITRITE NEGATIVE 07/06/2021 0352   LEUKOCYTESUR MODERATE (A) 07/06/2021 0352   Sepsis Labs Recent Labs  Lab 10/13/21 1542 10/14/21 0702  WBC 6.5 3.5*   Microbiology Recent Results (from the past 240 hour(s))  Resp Panel by RT-PCR (Flu A&B, Covid) Anterior Nasal Swab     Status: None   Collection Time: 10/13/21  5:53 PM   Specimen: Anterior Nasal Swab  Result Value Ref Range Status   SARS Coronavirus 2 by RT PCR NEGATIVE NEGATIVE Final    Comment: (NOTE) SARS-CoV-2 target nucleic acids are NOT DETECTED.  The SARS-CoV-2 RNA is generally detectable in upper respiratory specimens during the acute phase of infection. The lowest concentration of SARS-CoV-2 viral copies this assay can  detect is 138 copies/mL. A negative result does not preclude SARS-Cov-2 infection and should not be used as the sole basis for treatment or other patient management decisions. A negative result may occur with  improper specimen collection/handling, submission of specimen other than nasopharyngeal swab, presence of viral mutation(s) within the areas targeted by this assay, and inadequate number of viral copies(<138 copies/mL). A negative result must be combined with clinical observations, patient history, and epidemiological information. The expected result is Negative.  Fact Sheet for Patients:  EntrepreneurPulse.com.au  Fact Sheet for Healthcare Providers:  IncredibleEmployment.be  This test is no t yet approved or cleared by the [place] FDA and  has been authorized for detection and/or diagnosis of SARS-CoV-2 by FDA under an Emergency Use Authorization (EUA). This EUA will remain  in effect (meaning this test can be used) for the duration of the COVID-19 declaration under Section 564(b)(1) of the Act, 21 U.S.C.section 360bbb-3(b)(1), unless the authorization is terminated  or revoked sooner.       Influenza A by PCR NEGATIVE NEGATIVE Final   Influenza B by PCR NEGATIVE NEGATIVE Final    Comment: (NOTE) The Xpert Xpress SARS-CoV-2/FLU/RSV plus assay is intended as an aid in the diagnosis of influenza from Nasopharyngeal swab specimens and should not be used as a sole basis for treatment. Nasal washings and aspirates are unacceptable for Xpert Xpress SARS-CoV-2/FLU/RSV testing.  Fact Sheet for Patients: EntrepreneurPulse.com.au  Fact Sheet for Healthcare Providers: IncredibleEmployment.be  This test is not yet approved or cleared by the [place] FDA and has been authorized for detection and/or diagnosis of SARS-CoV-2 by FDA under an Emergency Use Authorization (EUA). This EUA will remain in  effect (meaning this test can be used) for the duration of the COVID-19 declaration under Section 564(b)(1) of the Act, 21 U.S.C. section  360bbb-3(b)(1), unless the authorization is terminated or revoked.  Performed at [facility], [place], [place] [address]   Respiratory (~20 pathogens) panel by PCR     Status: None   Collection Time: 10/14/21  7:09 AM   Specimen: Nasopharyngeal Swab; Respiratory  Result Value Ref Range Status   Adenovirus NOT DETECTED NOT DETECTED Final   Coronavirus 229E NOT DETECTED NOT DETECTED Final    Comment: (NOTE) The Coronavirus on the Respiratory Panel, DOES NOT test for the novel  Coronavirus (2019 nCoV)    Coronavirus HKU1 NOT DETECTED NOT DETECTED Final   Coronavirus NL63 NOT DETECTED NOT DETECTED Final   Coronavirus OC43 NOT DETECTED NOT DETECTED Final   Metapneumovirus NOT DETECTED NOT DETECTED Final   Rhinovirus / Enterovirus NOT DETECTED NOT DETECTED Final   Influenza A NOT DETECTED NOT DETECTED Final   Influenza B NOT DETECTED NOT DETECTED Final   Parainfluenza Virus 1 NOT DETECTED NOT DETECTED Final   Parainfluenza Virus 2 NOT DETECTED NOT DETECTED Final   Parainfluenza Virus 3 NOT DETECTED NOT DETECTED Final   Parainfluenza Virus 4 NOT DETECTED NOT DETECTED Final   Respiratory Syncytial Virus NOT DETECTED NOT DETECTED Final   Bordetella pertussis NOT DETECTED NOT DETECTED Final   Bordetella Parapertussis NOT DETECTED NOT DETECTED Final   Chlamydophila pneumoniae NOT DETECTED NOT DETECTED Final   Mycoplasma pneumoniae NOT DETECTED NOT DETECTED Final    Comment: Performed at [facility], [place]. [address] [place], [place] [address]  MRSA Next Gen by PCR, Nasal     Status: None   Collection Time: 10/14/21  7:09 AM   Specimen: Nasopharyngeal Swab; Nasal Swab  Result Value Ref Range Status   MRSA by PCR Next Gen NOT DETECTED NOT DETECTED Final    Comment: (NOTE) The GeneXpert MRSA Assay (FDA approved for NASAL  specimens only), is one component of a comprehensive MRSA colonization surveillance program. It is not intended to diagnose MRSA infection nor to guide or monitor treatment for MRSA infections. Test performance is not FDA approved in patients less than 84 years old. Performed at [facility], [place], [place] [address]    Imaging CT Angio Chest PE W and/or Wo Contrast  Result Date: 10/13/2021 CLINICAL DATA:  Dyspnea, asthma EXAM: CT ANGIOGRAPHY CHEST WITH CONTRAST TECHNIQUE: Multidetector CT imaging of the chest was performed using the standard protocol during bolus administration of intravenous contrast. Multiplanar CT image reconstructions and MIPs were obtained to evaluate the vascular anatomy. RADIATION DOSE REDUCTION: This exam was performed according to the departmental dose-optimization program which includes automated exposure control, adjustment of the mA and/or kV according to patient size and/or use of iterative reconstruction technique. CONTRAST:  3m OMNIPAQUE IOHEXOL 350 MG/ML SOLN COMPARISON:  None Available. FINDINGS: Cardiovascular: Adequate opacification of the pulmonary arterial tree. No intraluminal filling defect identified to suggest acute pulmonary embolism. Central pulmonary arteries are of normal caliber. No significant coronary artery calcification. Cardiac size within normal limits. No pericardial effusion. No significant atherosclerotic calcification within the thoracic aorta. No aortic aneurysm. Mediastinum/Nodes: No enlarged mediastinal, hilar, or axillary lymph nodes. Thyroid gland, trachea, and esophagus demonstrate no significant findings. Lungs/Pleura: There is mosaic attenuation of the pulmonary parenchyma in keeping with multifocal air trapping likely related to small airways disease. There is mild bronchial wall thickening noted in keeping with airway inflammation. There is extensive endobronchial debris with airway impaction within the  lingula and lower lobes bilaterally suspicious for aspiration given  the distribution. Infection and/or airway inflammation could appear similarly. There is marked narrowing of the right bronchus intermedius best seen on image # 68/5, in keeping with changes of bronchomalacia. No pneumothorax or pleural effusion. Upper Abdomen: No acute abnormality. Musculoskeletal: No acute bone abnormality Review of the MIP images confirms the above findings. IMPRESSION: 1. No pulmonary embolism. 2. Extensive endobronchial debris with airway impaction within the lingula and lower lobes bilaterally suspicious for aspiration given the distribution. Infection and/or airway inflammation could appear similarly. 3. Multifocal air trapping likely related to small airways disease. Bronchial wall thickening in keeping with large airway inflammation. 4. Marked narrowing of the right bronchus intermedius in keeping with bronchomalacia. Electronically Signed   By: Fidela Salisbury M.D.   On: 10/13/2021 20:46   DG Chest 2 View  Result Date: 10/13/2021 CLINICAL DATA:  Shortness of breath and cough EXAM: CHEST - 2 VIEW COMPARISON:  08/29/2021 FINDINGS: Airway thickening is present, suggesting bronchitis or reactive airways disease. Cardiac and mediastinal margins appear normal. Indistinct opacity peripherally at the left lung base could be from atelectasis or pneumonia. There is some volume loss in this vicinity on the prior CT from 07/06/2021. The lungs appear otherwise clear.  Mild thoracic spondylosis. IMPRESSION: 1. Indistinct opacity peripherally at the left lung base, probably from atelectasis or scarring, less likely pneumonia. 2. Airway thickening is present, suggesting bronchitis or reactive airways disease. Electronically Signed   By: Van Clines M.D.   On: 10/13/2021 15:56      Time coordinating discharge: Over 30 minutes  SIGNED:  Emeterio Reeve DO [facility]

## 2021-10-17 LAB — ANA COMPREHENSIVE PANEL

## 2021-10-18 LAB — ANCA PROFILE
Anti-MPO Antibodies: <0.2 units (ref 0.0–0.9)
Anti-PR3 Antibodies: <0.2 units (ref 0.0–0.9)
Atypical P-ANCA titer: <1:20 {titer}
C-ANCA: <1:20 {titer}
P-ANCA: <1:20 {titer}

## 2021-10-25 ENCOUNTER — Other Ambulatory Visit: Payer: Managed Care, Other (non HMO)

## 2021-11-11 ENCOUNTER — Other Ambulatory Visit (HOSPITAL_BASED_OUTPATIENT_CLINIC_OR_DEPARTMENT_OTHER): Payer: Self-pay | Admitting: Osteopathic Medicine

## 2021-11-12 ENCOUNTER — Other Ambulatory Visit (HOSPITAL_BASED_OUTPATIENT_CLINIC_OR_DEPARTMENT_OTHER): Payer: Self-pay | Admitting: Osteopathic Medicine

## 2021-11-16 ENCOUNTER — Inpatient Hospital Stay: Payer: BC Managed Care – PPO | Attending: Oncology | Admitting: Oncology

## 2021-11-16 ENCOUNTER — Encounter: Payer: Self-pay | Admitting: Oncology

## 2021-12-18 ENCOUNTER — Other Ambulatory Visit: Payer: Self-pay

## 2021-12-18 ENCOUNTER — Encounter: Payer: Self-pay | Admitting: Intensive Care

## 2021-12-18 ENCOUNTER — Emergency Department: Payer: BC Managed Care – PPO

## 2021-12-18 ENCOUNTER — Inpatient Hospital Stay
Admission: EM | Admit: 2021-12-18 | Discharge: 2021-12-21 | DRG: 202 | Disposition: A | Discharge status: Home / Self Care | Payer: BC Managed Care – PPO | Attending: Internal Medicine | Admitting: Internal Medicine

## 2021-12-18 DIAGNOSIS — J4541 Moderate persistent asthma with (acute) exacerbation: Principal | ICD-10-CM | POA: Diagnosis present

## 2021-12-18 DIAGNOSIS — E66813 Obesity, class 3: Secondary | ICD-10-CM | POA: Diagnosis present

## 2021-12-18 DIAGNOSIS — J9601 Acute respiratory failure with hypoxia: Secondary | ICD-10-CM | POA: Diagnosis present

## 2021-12-18 DIAGNOSIS — I10 Essential (primary) hypertension: Secondary | ICD-10-CM | POA: Diagnosis present

## 2021-12-18 DIAGNOSIS — R0902 Hypoxemia: Secondary | ICD-10-CM | POA: Diagnosis present

## 2021-12-18 DIAGNOSIS — F32A Depression, unspecified: Secondary | ICD-10-CM | POA: Diagnosis present

## 2021-12-18 DIAGNOSIS — Z79811 Long term (current) use of aromatase inhibitors: Secondary | ICD-10-CM

## 2021-12-18 DIAGNOSIS — Z853 Personal history of malignant neoplasm of breast: Secondary | ICD-10-CM | POA: Diagnosis not present

## 2021-12-18 DIAGNOSIS — Z87891 Personal history of nicotine dependence: Secondary | ICD-10-CM

## 2021-12-18 DIAGNOSIS — R9389 Abnormal findings on diagnostic imaging of other specified body structures: Secondary | ICD-10-CM

## 2021-12-18 DIAGNOSIS — Z833 Family history of diabetes mellitus: Secondary | ICD-10-CM

## 2021-12-18 DIAGNOSIS — M1990 Unspecified osteoarthritis, unspecified site: Secondary | ICD-10-CM | POA: Diagnosis present

## 2021-12-18 DIAGNOSIS — I272 Pulmonary hypertension, unspecified: Secondary | ICD-10-CM | POA: Diagnosis present

## 2021-12-18 DIAGNOSIS — Z6837 Body mass index (BMI) 37.0-37.9, adult: Secondary | ICD-10-CM

## 2021-12-18 DIAGNOSIS — E669 Obesity, unspecified: Secondary | ICD-10-CM | POA: Diagnosis not present

## 2021-12-18 DIAGNOSIS — Z923 Personal history of irradiation: Secondary | ICD-10-CM

## 2021-12-18 DIAGNOSIS — G4733 Obstructive sleep apnea (adult) (pediatric): Secondary | ICD-10-CM | POA: Diagnosis present

## 2021-12-18 DIAGNOSIS — Z79890 Hormone replacement therapy (postmenopausal): Secondary | ICD-10-CM | POA: Diagnosis not present

## 2021-12-18 DIAGNOSIS — E1169 Type 2 diabetes mellitus with other specified complication: Secondary | ICD-10-CM

## 2021-12-18 DIAGNOSIS — J45901 Unspecified asthma with (acute) exacerbation: Secondary | ICD-10-CM

## 2021-12-18 DIAGNOSIS — E119 Type 2 diabetes mellitus without complications: Secondary | ICD-10-CM | POA: Diagnosis present

## 2021-12-18 DIAGNOSIS — J9811 Atelectasis: Secondary | ICD-10-CM | POA: Diagnosis present

## 2021-12-18 DIAGNOSIS — Z1152 Encounter for screening for COVID-19: Secondary | ICD-10-CM | POA: Diagnosis not present

## 2021-12-18 HISTORY — DX: Unspecified asthma with (acute) exacerbation: J45.901

## 2021-12-18 LAB — CBC WITH DIFFERENTIAL/PLATELET
Abs Immature Granulocytes: 0.02 10*3/uL (ref 0.00–0.07)
Basophils Absolute: 0.1 10*3/uL (ref 0.0–0.1)
Basophils Relative: 1 %
Eosinophils Absolute: 1.6 10*3/uL — ABNORMAL HIGH (ref 0.0–0.5)
Eosinophils Relative: 26 %
HCT: 39.8 % (ref 36.0–46.0)
Hemoglobin: 12.7 g/dL (ref 12.0–15.0)
Immature Granulocytes: 0 %
Lymphocytes Relative: 24 %
Lymphs Abs: 1.5 10*3/uL (ref 0.7–4.0)
MCH: 28 pg (ref 26.0–34.0)
MCHC: 31.9 g/dL (ref 30.0–36.0)
MCV: 87.9 fL (ref 80.0–100.0)
Monocytes Absolute: 0.6 10*3/uL (ref 0.1–1.0)
Monocytes Relative: 10 %
Neutro Abs: 2.4 10*3/uL (ref 1.7–7.7)
Neutrophils Relative %: 39 %
Platelets: 227 10*3/uL (ref 150–400)
RBC: 4.53 MIL/uL (ref 3.87–5.11)
RDW: 13.6 % (ref 11.5–15.5)
WBC: 6.2 10*3/uL (ref 4.0–10.5)
nRBC: 0 % (ref 0.0–0.2)

## 2021-12-18 LAB — COMPREHENSIVE METABOLIC PANEL
ALT: 15 U/L (ref 0–44)
AST: 21 U/L (ref 15–41)
Albumin: 4 g/dL (ref 3.5–5.0)
Alkaline Phosphatase: 53 U/L (ref 38–126)
Anion gap: 6 (ref 5–15)
BUN: 14 mg/dL (ref 8–23)
CO2: 24 mmol/L (ref 22–32)
Calcium: 8.9 mg/dL (ref 8.9–10.3)
Chloride: 111 mmol/L (ref 98–111)
Creatinine, Ser: 0.76 mg/dL (ref 0.44–1.00)
GFR, Estimated: >60 mL/min (ref >60)
Glucose, Bld: 167 mg/dL — ABNORMAL HIGH (ref 70–99)
Potassium: 3.6 mmol/L (ref 3.5–5.1)
Sodium: 141 mmol/L (ref 135–145)
Total Bilirubin: 0.5 mg/dL (ref 0.3–1.2)
Total Protein: 6.9 g/dL (ref 6.5–8.1)

## 2021-12-18 LAB — D-DIMER, QUANTITATIVE: D-Dimer, Quant: 1.67 ug/mL-FEU — ABNORMAL HIGH (ref 0.00–0.50)

## 2021-12-18 LAB — BLOOD GAS, VENOUS
Acid-Base Excess: 1 mmol/L (ref 0.0–2.0)
Bicarbonate: 27.1 mmol/L (ref 20.0–28.0)
O2 Saturation: 50.9 %
Patient temperature: 37
pCO2, Ven: 48 mmHg (ref 44–60)
pH, Ven: 7.36 (ref 7.25–7.43)
pO2, Ven: 35 mmHg (ref 32–45)

## 2021-12-18 LAB — C-REACTIVE PROTEIN: CRP: 0.6 mg/dL (ref <1.0)

## 2021-12-18 LAB — RESP PANEL BY RT-PCR (FLU A&B, COVID) ARPGX2
Influenza A by PCR: NEGATIVE
Influenza B by PCR: NEGATIVE
SARS Coronavirus 2 by RT PCR: NEGATIVE

## 2021-12-18 LAB — BRAIN NATRIURETIC PEPTIDE: B Natriuretic Peptide: 32.9 pg/mL (ref 0.0–100.0)

## 2021-12-18 LAB — TROPONIN I (HIGH SENSITIVITY)
Troponin I (High Sensitivity): 4 ng/L (ref <18)
Troponin I (High Sensitivity): 4 ng/L (ref <18)

## 2021-12-18 LAB — GLUCOSE, CAPILLARY
Glucose-Capillary: 201 mg/dL — ABNORMAL HIGH (ref 70–99)
Glucose-Capillary: 266 mg/dL — ABNORMAL HIGH (ref 70–99)

## 2021-12-18 LAB — SEDIMENTATION RATE: Sed Rate: 11 mm/hr (ref 0–30)

## 2021-12-18 MED ORDER — ACETAMINOPHEN 325 MG PO TABS: 650.0000 mg | ORAL_TABLET | Freq: Four times a day (QID) | ORAL | Status: DC | PRN

## 2021-12-18 MED ORDER — GUAIFENESIN ER 600 MG PO TB12: 600.0000 mg | ORAL_TABLET | Freq: Two times a day (BID) | ORAL | Status: DC | PRN

## 2021-12-18 MED ORDER — IPRATROPIUM-ALBUTEROL 0.5-2.5 (3) MG/3ML IN SOLN
3.0000 mL | Freq: Once | RESPIRATORY_TRACT | Status: AC
  Administered 2021-12-18: 3 mL via RESPIRATORY_TRACT
  Filled 2021-12-18: qty 3

## 2021-12-18 MED ORDER — IPRATROPIUM-ALBUTEROL 0.5-2.5 (3) MG/3ML IN SOLN
3.0000 mL | Freq: Four times a day (QID) | RESPIRATORY_TRACT | Status: DC
  Administered 2021-12-18 – 2021-12-21 (×11): 3 mL via RESPIRATORY_TRACT
  Filled 2021-12-18 (×11): qty 3

## 2021-12-18 MED ORDER — UMECLIDINIUM BROMIDE 62.5 MCG/ACT IN AEPB: 1.0000 | INHALATION_SPRAY | Freq: Every day | RESPIRATORY_TRACT | Status: DC

## 2021-12-18 MED ORDER — AZITHROMYCIN 250 MG PO TABS
250.0000 mg | ORAL_TABLET | Freq: Every day | ORAL | Status: DC
  Administered 2021-12-18 – 2021-12-21 (×4): 250 mg via ORAL
  Filled 2021-12-18 (×4): qty 1

## 2021-12-18 MED ORDER — HYDROCHLOROTHIAZIDE 12.5 MG PO TABS
12.5000 mg | ORAL_TABLET | Freq: Every day | ORAL | Status: DC
  Administered 2021-12-18 – 2021-12-21 (×4): 12.5 mg via ORAL
  Filled 2021-12-18 (×4): qty 1

## 2021-12-18 MED ORDER — METHYLPREDNISOLONE SODIUM SUCC 125 MG IJ SOLR
125.0000 mg | Freq: Once | INTRAMUSCULAR | Status: AC
  Administered 2021-12-18: 125 mg via INTRAVENOUS
  Filled 2021-12-18: qty 2

## 2021-12-18 MED ORDER — ENOXAPARIN SODIUM 60 MG/0.6ML IJ SOSY
0.5000 mg/kg | PREFILLED_SYRINGE | INTRAMUSCULAR | Status: DC
  Administered 2021-12-18 – 2021-12-20 (×3): 52.5 mg via SUBCUTANEOUS
  Filled 2021-12-18 (×3): qty 0.6

## 2021-12-18 MED ORDER — ACETAMINOPHEN 650 MG RE SUPP: 650.0000 mg | Freq: Four times a day (QID) | RECTAL | Status: DC | PRN

## 2021-12-18 MED ORDER — MONTELUKAST SODIUM 10 MG PO TABS
10.0000 mg | ORAL_TABLET | Freq: Every day | ORAL | Status: DC
  Administered 2021-12-19 – 2021-12-20 (×2): 10 mg via ORAL
  Filled 2021-12-18 (×2): qty 1

## 2021-12-18 MED ORDER — ALBUTEROL SULFATE (2.5 MG/3ML) 0.083% IN NEBU: 2.5000 mg | INHALATION_SOLUTION | RESPIRATORY_TRACT | Status: DC | PRN

## 2021-12-18 MED ORDER — METHYLPREDNISOLONE SODIUM SUCC 40 MG IJ SOLR
40.0000 mg | Freq: Two times a day (BID) | INTRAMUSCULAR | Status: AC
  Administered 2021-12-19 (×2): 40 mg via INTRAVENOUS
  Filled 2021-12-18 (×2): qty 1

## 2021-12-18 MED ORDER — PREDNISONE 20 MG PO TABS
40.0000 mg | ORAL_TABLET | Freq: Every day | ORAL | Status: DC
  Administered 2021-12-20: 40 mg via ORAL
  Filled 2021-12-18: qty 2

## 2021-12-18 MED ORDER — LOSARTAN POTASSIUM-HCTZ 50-12.5 MG PO TABS: 1.0000 | ORAL_TABLET | Freq: Every day | ORAL | Status: DC

## 2021-12-18 MED ORDER — SODIUM CHLORIDE 0.9 % IV SOLN: INTRAVENOUS | Status: DC

## 2021-12-18 MED ORDER — CALCIUM CARBONATE ANTACID 1000 MG PO CHEW: 1000.0000 mg | CHEWABLE_TABLET | Freq: Three times a day (TID) | ORAL | Status: DC

## 2021-12-18 MED ORDER — FLUTICASONE FUROATE-VILANTEROL 100-25 MCG/ACT IN AEPB
1.0000 | INHALATION_SPRAY | Freq: Every day | RESPIRATORY_TRACT | Status: DC
  Administered 2021-12-19 – 2021-12-21 (×3): 1 via RESPIRATORY_TRACT
  Filled 2021-12-18 (×2): qty 28

## 2021-12-18 MED ORDER — LOSARTAN POTASSIUM 50 MG PO TABS
50.0000 mg | ORAL_TABLET | Freq: Every day | ORAL | Status: DC
  Administered 2021-12-18 – 2021-12-21 (×4): 50 mg via ORAL
  Filled 2021-12-18 (×4): qty 1

## 2021-12-18 MED ORDER — PANTOPRAZOLE SODIUM 40 MG PO TBEC
40.0000 mg | DELAYED_RELEASE_TABLET | Freq: Every day | ORAL | Status: DC
  Administered 2021-12-19 – 2021-12-21 (×3): 40 mg via ORAL
  Filled 2021-12-18 (×3): qty 1

## 2021-12-18 MED ORDER — CITALOPRAM HYDROBROMIDE 20 MG PO TABS
40.0000 mg | ORAL_TABLET | Freq: Every day | ORAL | Status: DC
  Administered 2021-12-19 – 2021-12-21 (×3): 40 mg via ORAL
  Filled 2021-12-18 (×3): qty 2

## 2021-12-18 MED ORDER — METHYLPREDNISOLONE SODIUM SUCC 40 MG IJ SOLR: 40.0000 mg | INTRAMUSCULAR | Status: DC

## 2021-12-18 MED ORDER — INSULIN ASPART 100 UNIT/ML IJ SOLN
0.0000 [IU] | Freq: Three times a day (TID) | INTRAMUSCULAR | Status: DC
  Administered 2021-12-18: 5 [IU] via SUBCUTANEOUS
  Administered 2021-12-19: 3 [IU] via SUBCUTANEOUS
  Administered 2021-12-19: 2 [IU] via SUBCUTANEOUS
  Administered 2021-12-19: 5 [IU] via SUBCUTANEOUS
  Administered 2021-12-20: 3 [IU] via SUBCUTANEOUS
  Administered 2021-12-20: 8 [IU] via SUBCUTANEOUS
  Administered 2021-12-20 – 2021-12-21 (×2): 3 [IU] via SUBCUTANEOUS
  Filled 2021-12-18 (×8): qty 1

## 2021-12-18 MED ORDER — ONDANSETRON HCL 4 MG PO TABS: 4.0000 mg | ORAL_TABLET | Freq: Four times a day (QID) | ORAL | Status: DC | PRN

## 2021-12-18 MED ORDER — ONDANSETRON HCL 4 MG/2ML IJ SOLN: 4.0000 mg | Freq: Four times a day (QID) | INTRAMUSCULAR | Status: DC | PRN

## 2021-12-18 MED ORDER — LETROZOLE 2.5 MG PO TABS: 2.5000 mg | ORAL_TABLET | Freq: Every day | ORAL | Status: DC

## 2021-12-18 NOTE — Assessment & Plan Note (Signed)
BMI 85.46 Complicates overall prognosis and care

## 2021-12-18 NOTE — ED Notes (Signed)
Pt titrated off [place] at this time. Pt is sat 95% on RA

## 2021-12-18 NOTE — Assessment & Plan Note (Addendum)
Hold metformin.  Currently on sliding scale insulin.  He remains ago hemoglobin A1c was 5.6

## 2021-12-18 NOTE — H&P (Signed)
History and Physical    Patient: Nancy Patton JYN:[id number] DOB: [birth year] DOA: 12/18/2021 DOS: the patient was seen and examined on 12/18/2021 PCP: Perrin Maltese, MD  Patient coming from: Home  Chief Complaint:  Chief Complaint  Patient presents with   Shortness of Breath   HPI: Nancy Patton is a 61 y.o. female with medical history significant for asthma, obesity (BMI 37), diabetes mellitus, history of breast cancer status postlumpectomy and currently on hormonal therapy with letrozole who was discharged from the hospital in 08/23 after hospitalization for severe asthma exacerbation with hypoxemia. She presents to the ER today for evaluation of a 3-day history of shortness of breath with mild to moderate exertion associated with wheezing and a cough productive of clear phlegm.  She has used her home nebulizer treatments without any significant improvement.  She states that she was seen by the pulmonologist after her hospital discharge in August and he was going to start her on a new medication but she has not been able to get it due to insurance issues. She was on a course of steroids for over a month and states that her symptoms started as soon as she completed her steroid therapy. She denies having any chest pain, no fever, no chills, no palpitations, no diaphoresis, no abdominal pain, no urinary symptoms, no changes in her bowel habits, no headache, no blurred vision no focal deficits. She denies any nicotine use and does not vape. Her room air pulse oximetry upon arrival to the ER was 83% and she was tachypneic with respiratory rate of 26. She initially required 4 L of oxygen to improve pulse oximetry to 95% and was noted to have expiratory wheezes in all lung fields. She received a dose of methylprednisolone and DuoNeb and will be admitted to the hospital for further evaluation.    Review of Systems: As mentioned in the history of present illness. All other  systems reviewed and are negative. Past Medical History:  Diagnosis Date   Arthritis    Asthma    Breast cancer ([place])    Diabetes mellitus without complication ([place])    Hypertension    Obesity    Personal history of radiation therapy    Pulmonary hypertension ([place])    Sleep apnea    Past Surgical History:  Procedure Laterality Date   BREAST LUMPECTOMY Right 05/23/2019   invasive DCIS radation    CATARACT EXTRACTION     CESAREAN SECTION     x 3    Social History:  reports that she has quit smoking. Her smoking use included cigarettes. She has never used smokeless tobacco. She reports that she does not drink alcohol and does not use drugs.  No Known Allergies  Family History  Problem Relation Age of Onset   Diabetes Mother    Stroke Mother    Diabetes Father    Diabetes Brother    Cancer Neg Hx    Heart failure Neg Hx    Hypertension Neg Hx    Breast cancer Neg Hx     Prior to Admission medications   Medication Sig Start Date End Date Taking? Authorizing Provider  albuterol (VENTOLIN HFA) 108 (90 Base) MCG/ACT inhaler Inhale 2 puffs into the lungs every 6 (six) hours as needed for wheezing or shortness of breath. 10/16/21   Emeterio Reeve, DO  calcium elemental as carbonate (CALCIUM ANTACID ULTRA) 400 MG chewable tablet Chew 1,000 mg by mouth 3 (three) times daily.    [provider]  citalopram (CELEXA) 40 MG tablet Take 40 mg by mouth daily.  09/15/14   [provider]  D3-50 1.25 MG (50000 UT) capsule Take by mouth once a week. 08/22/19   [provider]  guaiFENesin (MUCINEX) 600 MG 12 hr tablet Take 1-2 tablets (600-1,200 mg total) by mouth 2 (two) times daily as needed. 10/16/21   Emeterio Reeve, DO  ibuprofen (ADVIL) 800 MG tablet Take 800 mg by mouth every 8 (eight) hours as needed. 06/20/21   [provider]  ipratropium-albuterol (DUONEB) 0.5-2.5 (3) MG/3ML SOLN Take 3 mLs by nebulization every 4 (four) hours as needed  (shortness of breath / wheeze). 10/16/21   Emeterio Reeve, DO  letrozole (FEMARA) 2.5 MG tablet TAKE 1 TABLET BY MOUTH EVERY DAY (DON'T START MED UNTIL YOU COMPLETE RADIATION 1ST) 04/25/21   Sindy Guadeloupe, MD  losartan-hydrochlorothiazide (HYZAAR) 50-12.5 MG per tablet Take 1 tablet by mouth daily. 09/15/14   [provider]  metFORMIN (GLUCOPHAGE) 1000 MG tablet Take 1,000 mg by mouth 2 (two) times daily. 09/09/14   [provider]  montelukast (SINGULAIR) 10 MG tablet Take 1 tablet (10 mg total) by mouth every morning. 10/16/21   Emeterio Reeve, DO  [facility] 5 MG/0.5ML Pen Inject 5 mg into the skin once a week. 10/03/21   [provider]  pantoprazole (PROTONIX) 40 MG tablet Take 1 tablet (40 mg total) by mouth daily. 10/16/21   Emeterio Reeve, DO  TRELEGY ELLIPTA 100-62.5-25 MCG/ACT AEPB Take 1 puff by mouth daily. 10/16/21   Emeterio Reeve, DO    Physical Exam: Vitals:   12/18/21 1433 12/18/21 1530 12/18/21 1600 12/18/21 1630  BP:  (!) 142/90 (!) 147/84 (!) 156/76  Pulse:  83 84 89  Resp:  '15 19 18  '$ Temp:      TempSrc:      SpO2: 98% 97% 97% 98%  Weight:      Height:       Physical Exam Vitals and nursing note reviewed.  Constitutional:      Appearance: She is obese.  HENT:     Head: Normocephalic and atraumatic.  Eyes:     Pupils: Pupils are equal, round, and reactive to light.  Cardiovascular:     Rate and Rhythm: Tachycardia present.  Pulmonary:     Effort: Tachypnea present.     Breath sounds: Examination of the right-upper field reveals wheezing. Examination of the left-upper field reveals wheezing. Examination of the right-middle field reveals wheezing. Examination of the left-middle field reveals wheezing. Examination of the right-lower field reveals wheezing. Examination of the left-lower field reveals wheezing. Wheezing present.  Abdominal:     General: Bowel sounds are normal.     Palpations: Abdomen is soft.  Musculoskeletal:         General: Normal range of motion.     Cervical back: Normal range of motion and neck supple.  Skin:    General: Skin is warm and dry.  Neurological:     General: No focal deficit present.     Mental Status: She is alert.  Psychiatric:        Mood and Affect: Mood normal.        Behavior: Behavior normal.     Data Reviewed: Relevant notes from primary care and specialist visits, past discharge summaries as available in EHR, including Care Everywhere. Prior diagnostic testing as pertinent to current admission diagnoses Updated medications and problem lists for reconciliation ED course, including vitals, labs, imaging, treatment  and response to treatment Triage notes, nursing and pharmacy notes and ED provider's notes Notable results as noted in HPI Labs reviewed.  BNP 32.9, troponin 4, sodium 141, potassium 3.9, chloride 111, bicarb 24, glucose 167, BUN 14, creatinine 0.76, calcium 8.9, total protein 6.9, albumin 4.0, AST 21, ALT 15, alkaline phosphatase 53, total bilirubin 0.5, white count 6.2, hemoglobin 12.7, hematocrit 39.8, platelet count 227 Respiratory viral panel is negative Chest x-ray shows no evidence of acute cardiopulmonary disease Twelve-lead EKG shows normal sinus rhythm.  Low voltage QRS. There are no new results to review at this time.  Assessment and Plan: * Acute respiratory failure with hypoxia (HCC) Secondary to acute asthma exacerbation Patient presented to the ER for evaluation of shortness of breath, productive cough and wheezing and had room air pulse oximetry of 83%. She had tachypnea with use of accessory muscles and required 4 L of oxygen to improve pulse oximetry to greater than 92%. We will attempt to wean patient off oxygen once acute illness improves or resolves.  Acute asthma exacerbation Patient with a history of asthma with frequent hospitalizations secondary to same Place patient systemic steroids Place patient on scheduled and as needed  bronchodilator therapy Continue oxygen supplementation to maintain pulse oximetry greater than 92% Consult pulmonary  HTN (hypertension) Continue losartan/HCTZ for blood pressure control  Obesity BMI 68.37 Complicates overall prognosis and care  History of breast cancer Status postlumpectomy Continue letrozole  Depression Continue citalopram  Type 2 diabetes mellitus (HCC) Hold metformin Maintain consistent carbohydrate diet Place patient on sliding scale insulin      Advance Care Planning:   Code Status: Full Code   Consults: Pulmonary  Family Communication: Greater than 50% of time was spent discussing patient's condition and plan of care with her at the bedside.  All questions and concerns have been addressed.  She verbalizes understanding and agrees with the plan.  Severity of Illness: The appropriate patient status for this patient is INPATIENT. Inpatient status is judged to be reasonable and necessary in order to provide the required intensity of service to ensure the patient's safety. The patient's presenting symptoms, physical exam findings, and initial radiographic and laboratory data in the context of their chronic comorbidities is felt to place them at high risk for further clinical deterioration. Furthermore, it is not anticipated that the patient will be medically stable for discharge from the hospital within 2 midnights of admission.   * I certify that at the point of admission it is my clinical judgment that the patient will require inpatient hospital care spanning beyond 2 midnights from the point of admission due to high intensity of service, high risk for further deterioration and high frequency of surveillance required.*  Author: Collier Bullock, MD 12/18/2021 6:00 PM  For on call review [email or web address].

## 2021-12-18 NOTE — Progress Notes (Signed)
Prior-To-Admission Oral Chemotherapy for Treatment of Oncologic Disease   Order noted from Dr. Francine Graven to continue prior-to-admission oral chemotherapy regimen of letrozole.  Procedure Per Pharmacy & Therapeutics Committee Policy: Orders for continuation of home oral chemotherapy for treatment of an oncologic disease will be held unless approved by an oncologist during current admission.    For patients receiving oncology care at [facility], inpatient pharmacist contacts patient's oncologist during regular office hours to review. If earlier review is medically necessary, attending physician consults [facility] on-call oncologist   For patients receiving oncology care outside of [facility], attending physician consults patient's oncologist to review. If this oncologist or their coverage cannot be reached, attending physician consults [facility] on-call oncologist   Oral chemotherapy continuation order is on hold pending oncologist review, [facility] oncologist Dr. Janese Banks will be notified by inpatient pharmacy during office hours   Lu Duffel, PharmD, MBA

## 2021-12-18 NOTE — Assessment & Plan Note (Signed)
Continue losartan/HCTZ for blood pressure control

## 2021-12-18 NOTE — Assessment & Plan Note (Signed)
Status postlumpectomy Continue letrozole

## 2021-12-18 NOTE — ED Notes (Signed)
This RN attempted to insert Peripheral IV, was unsuccessful. IV team consult placed for difficult stick.

## 2021-12-18 NOTE — Assessment & Plan Note (Signed)
Secondary to acute asthma exacerbation Room air pulse oximetry of 83%. Try to see if we can taper off oxygen.  Currently on 3 L of oxygen.

## 2021-12-18 NOTE — ED Triage Notes (Signed)
Patient reports she started feeling sob a few days ago. Has breathing treatments at home with no relief.

## 2021-12-18 NOTE — Consult Note (Signed)
PULMONOLOGY         Date: 12/18/2021,   MRN# [id number] Nancy ASCHENBRENNER 1960/10/10     AdmissionWeight: 106.6 kg                 CurrentWeight: 106.6 kg  Referring provider: Dr Francine Graven   CHIEF COMPLAINT:   Acute hypoxemic respiratory failure   HISTORY OF PRESENT ILLNESS   This is a 61 year old female with arthritis, moderate persistent asthma with recurrent exacerbations, history of breast cancer, diabetes, essential hypertension, obesity, sleep apnea and pulmonary hypertension who came in for acute hypoxemic respiratory failure.  She previously had lumpectomy for breast cancer and is on letrozole.  She recently was discharged from the hospital in August for similar presentation at that time had CT chest with PE protocol which was negative for PE however showed small airway disease with signs of bronchiolitis consistent with asthma exacerbation.  At that time she was treated with typical care path for asthma exacerbation with steroids and antimicrobials as well as incentive spirometry and bronchopulmonary hygiene subsequently improved and was discharged home.  She did not have outpatient follow-up as recommended.  In the ER she required 4 L of oxygen to reach normoxia, she did receive IV steroids with Solu-Medrol as well as nebulizer treatments and improved quite quickly.  During my evaluation patient was able to speak in full sentences without use of accessory muscles of respiration or having any labored breathing.  She did have wheezing bilaterally which was mild to moderate.  She did not have chest, denied chest discomfort or any trauma or surgery.  Pulmonary consultation placed for additional evaluation and management of recurrent asthma exacerbations with rehospitalization.   PAST MEDICAL HISTORY   Past Medical History:  Diagnosis Date   Arthritis    Asthma    Breast cancer ([place])    Diabetes mellitus without complication ([place])    Hypertension    Obesity     Personal history of radiation therapy    Pulmonary hypertension ([place])    Sleep apnea      SURGICAL HISTORY   Past Surgical History:  Procedure Laterality Date   BREAST LUMPECTOMY Right 05/23/2019   invasive DCIS radation    CATARACT EXTRACTION     CESAREAN SECTION     x 3      FAMILY HISTORY   Family History  Problem Relation Age of Onset   Diabetes Mother    Stroke Mother    Diabetes Father    Diabetes Brother    Cancer Neg Hx    Heart failure Neg Hx    Hypertension Neg Hx    Breast cancer Neg Hx      SOCIAL HISTORY   Social History   Tobacco Use   Smoking status: Former    Types: Cigarettes   Smokeless tobacco: Never   Tobacco comments:    Quit smoking 30 years ago.  Vaping Use   Vaping Use: Never used  Substance Use Topics   Alcohol use: No   Drug use: No     MEDICATIONS    Home Medication:  Current Outpatient Rx   Order #: [id number] Class: Normal   Order #: [id number] Class: Historical Med   Order #: [id number] Class: Historical Med   Order #: [id number] Class: Historical Med   Order #: [id number] Class: Normal   Order #: [id number] Class: Historical Med   Order #: [id number] Class: Normal   Order #: [id number] Class: Normal   Order #: [id number] Class: Historical Med   Order #:  [id number] Class: Historical Med   Order #: [id number] Class: Normal   Order #: [id number] Class: Historical Med   Order #: [id number] Class: Normal   Order #: [id number] Class: Normal    Current Medication: No current facility-administered medications for this encounter.  Current Outpatient Medications:    albuterol (VENTOLIN HFA) 108 (90 Base) MCG/ACT inhaler, Inhale 2 puffs into the lungs every 6 (six) hours as needed for wheezing or shortness of breath., Disp: 18 g, Rfl: 0   calcium elemental as carbonate (CALCIUM ANTACID ULTRA) 400 MG chewable tablet, Chew 1,000 mg by mouth 3 (three) times daily., Disp: , Rfl:    citalopram (CELEXA) 40 MG tablet, Take 40 mg by mouth daily. , Disp: ,  Rfl: 10   D3-50 1.25 MG (50000 UT) capsule, Take by mouth once a week., Disp: , Rfl:    guaiFENesin (MUCINEX) 600 MG 12 hr tablet, Take 1-2 tablets (600-1,200 mg total) by mouth 2 (two) times daily as needed., Disp: 30 tablet, Rfl: 0   ibuprofen (ADVIL) 800 MG tablet, Take 800 mg by mouth every 8 (eight) hours as needed., Disp: , Rfl:    ipratropium-albuterol (DUONEB) 0.5-2.5 (3) MG/3ML SOLN, Take 3 mLs by nebulization every 4 (four) hours as needed (shortness of breath / wheeze)., Disp: 360 mL, Rfl: 0   letrozole (FEMARA) 2.5 MG tablet, TAKE 1 TABLET BY MOUTH EVERY DAY (DON'T START MED UNTIL YOU COMPLETE RADIATION 1ST), Disp: 90 tablet, Rfl: 2   losartan-hydrochlorothiazide (HYZAAR) 50-12.5 MG per tablet, Take 1 tablet by mouth daily., Disp: , Rfl: 5   metFORMIN (GLUCOPHAGE) 1000 MG tablet, Take 1,000 mg by mouth 2 (two) times daily., Disp: , Rfl: 2   montelukast (SINGULAIR) 10 MG tablet, Take 1 tablet (10 mg total) by mouth every morning., Disp: 30 tablet, Rfl: 0   MOUNJARO 5 MG/0.5ML Pen, Inject 5 mg into the skin once a week., Disp: , Rfl:    pantoprazole (PROTONIX) 40 MG tablet, Take 1 tablet (40 mg total) by mouth daily., Disp: 30 tablet, Rfl: 0   TRELEGY ELLIPTA 100-62.5-25 MCG/ACT AEPB, Take 1 puff by mouth daily., Disp: 60 each, Rfl: 0    ALLERGIES   Patient has no known allergies.     REVIEW OF SYSTEMS    Review of Systems:  Gen:  Denies  fever, sweats, chills weigh loss  HEENT: Denies blurred vision, double vision, ear pain, eye pain, hearing loss, nose bleeds, sore throat Cardiac:  No dizziness, chest pain or heaviness, chest tightness,edema Resp:   reports dyspnea chronically  Gi: Denies swallowing difficulty, stomach pain, nausea or vomiting, diarrhea, constipation, bowel incontinence Gu:  Denies bladder incontinence, burning urine Ext:   Denies Joint pain, stiffness or swelling Skin: Denies  skin rash, easy bruising or bleeding or hives Endoc:  Denies polyuria,  polydipsia , polyphagia or weight change Psych:   Denies depression, insomnia or hallucinations   Other:  All other systems negative   VS: BP (!) 147/84   Pulse 84   Temp 98.4 F (36.9 C) (Oral)   Resp 19   Ht _0  (1.676 m)   Wt 106.6 kg   SpO2 97%   BMI 37.93 kg/m      PHYSICAL EXAM    GENERAL:NAD, no fevers, chills, no weakness no fatigue HEAD: Normocephalic, atraumatic.  EYES: Pupils equal, round, reactive to light. Extraocular muscles intact. No scleral icterus.  MOUTH: Moist mucosal membrane. Dentition intact. No abscess noted.  EAR, NOSE, THROAT: Clear without exudates. No external lesions.  NECK:  Supple. No thyromegaly. No nodules. No JVD.  PULMONARY: Expiratory wheezing bilaterally worse at the bases CARDIOVASCULAR: S1 and S2. Regular rate and rhythm. No murmurs, rubs, or gallops. No edema. Pedal pulses 2+ bilaterally.  GASTROINTESTINAL: Soft, nontender, nondistended. No masses. Positive bowel sounds. No hepatosplenomegaly.  MUSCULOSKELETAL: No swelling, clubbing, or edema. Range of motion full in all extremities.  NEUROLOGIC: Cranial nerves II through XII are intact. No gross focal neurological deficits. Sensation intact. Reflexes intact.  SKIN: No ulceration, lesions, rashes, or cyanosis. Skin warm and dry. Turgor intact.  PSYCHIATRIC: Mood, affect within normal limits. The patient is awake, alert and oriented x 3. Insight, judgment intact.       IMAGING   Reviewed x-ray and there is a CT PE in process  ASSESSMENT/PLAN   Moderate to severe acute exacerbation of asthma  -Agree with IV Solu-Medrol, will reduce to 40 mg IV tomorrow and hoping to continue with p.o. taper prednisone -We will also test for respiratory viral panel for secondary causes due to viral organism -Respiratory cultures have been ordered -Laboratory biomarkers including ESR, CRP   -D-dimer is markedly elevated, there is CT PE process moderate pretest probability of venous  thromboembolism, due to her history of breast cancer currently on letrozole, and sedentary lifestyle   Diabetes type 2  -Close monitoring while on steroids with additional insulin via ISS ACHS    Morbid obesity Contributing to asthma adding exercise on outpatient  Obstructive sleep apnea May utilize CPAP with settings per respiratory therapy   Mild atelectasis Bronchopulmonary hygiene with incentive spirometry each hour while inpatient   Anticipate discharge within 24 to 48 hours.   Thank you for allowing me to participate in the care of this patient.   Patient/Family are satisfied with care plan and all questions have been answered.    Provider disclosure: Patient with at least one acute or chronic illness or injury that poses a threat to life or bodily function and is being managed actively during this encounter.  All of the below services have been performed independently by signing provider:  review of prior documentation from internal and or external health records.  Review of previous and current lab results.  Interview and comprehensive assessment during patient visit today. Review of current and previous chest radiographs/CT scans. Discussion of management and test interpretation with health care team and patient/family.   This document was prepared using Dragon voice recognition software and may include unintentional dictation errors.     Ottie Glazier, M.D.  Division of Pulmonary & Critical Care Medicine

## 2021-12-18 NOTE — Assessment & Plan Note (Addendum)
Solu-Medrol today.  Continue nebulizers.

## 2021-12-18 NOTE — ED Provider Notes (Signed)
[facility] Provider Note    Event Date/Time   First MD Initiated Contact with Patient 12/18/21 1437     (approximate)   History   Shortness of Breath   HPI  Nancy Patton is a 61 y.o. female with past medical history significant for obesity, asthma, who presents to the emergency department with shortness of breath and cough.  Patient endorses a 3-day history of productive cough.  Significantly worsening shortness of breath that started last night and worsened this morning.  Trying her home albuterol treatments without significant improvement.  Prior hospitalizations for asthma and her last hospitalization was approximately 6 months ago.  1 prior ICU admission for requiring BiPAP.  No prior intubations.  Denies any chest pain.  No prior history of DVT or PE.  Denies any nausea vomiting or diaphoresis.  States that she is following with pulmonology.  Denies any tobacco use or vaping.  No alcohol use.  No recent antibiotics or steroids or hospitalizations in the past 3 months.      Physical Exam   Triage Vital Signs: ED Triage Vitals  Enc Vitals Group     BP 12/18/21 1428 136/66     Pulse Rate 12/18/21 1428 95     Resp 12/18/21 1428 (!) 26     Temp 12/18/21 1428 98.4 F (36.9 C)     Temp Source 12/18/21 1428 Oral     SpO2 12/18/21 1427 (!) 83 %     Weight 12/18/21 1429 235 lb (106.6 kg)     Height 12/18/21 1429 '5\' 6"'$  (1.676 m)     Head Circumference --      Peak Flow --      Pain Score 12/18/21 1429 0     Pain Loc --      Pain Edu? --      Excl. in [place]? --     Most recent vital signs: Vitals:   12/18/21 1428 12/18/21 1433  BP: 136/66   Pulse: 95   Resp: (!) 26   Temp: 98.4 F (36.9 C)   SpO2: (!) 83% 91%    Physical Exam Constitutional:      General: She is in acute distress.     Appearance: She is well-developed. She is ill-appearing.  HENT:     Head: Atraumatic.  Eyes:     Conjunctiva/sclera: Conjunctivae normal.   Cardiovascular:     Rate and Rhythm: Regular rhythm.  Pulmonary:     Effort: Respiratory distress present.     Breath sounds: Wheezing and rales present.     Comments: Hypoxic to 82% requiring 4 L nasal cannula at 95%.  Diffuse inspiratory and expiratory wheezing throughout all lung fields.  Coarse breath sounds.  Tachypneic with retractions. Abdominal:     General: There is no distension.  Musculoskeletal:        General: Normal range of motion.     Cervical back: Normal range of motion.     Right lower leg: No edema.     Left lower leg: No edema.  Skin:    General: Skin is warm.     Capillary Refill: Capillary refill takes less than 2 seconds.     Coloration: Skin is not pale.  Neurological:     Mental Status: She is alert. Mental status is at baseline.          IMPRESSION / MDM / ASSESSMENT AND PLAN / ED COURSE  I reviewed the triage vital signs and the  nursing notes.  Differential diagnosis including asthma exacerbation, pneumonia, COVID, ACS, heart failure, anemia  Patient was given DuoNeb treatments continuously, given IV Solu-Medrol  EKG  My interpretation of the EKG -normal sinus rhythm.  Normal intervals.  No chamber enlargement.  No significant ST elevation or depression.  No signs of acute ischemia or dysrhythmia.  No change compared to prior  No tachycardic or bradycardic dysrhythmias while on cardiac telemetry.  RADIOLOGY I independently reviewed imaging, my interpretation of imaging: Chest x-ray without focal findings consistent with pneumonia.  Read as no acute findings      ED Results / Procedures / Treatments   Labs (all labs ordered are listed, but only abnormal results are displayed) Labs interpreted as -   No leukocytosis or significant anemia.  Creatinine is at her baseline.  Troponin negative.  No concern for heart failure exacerbation.  Have a low suspicion for pulmonary embolism or ACS.  Labs Reviewed  CBC WITH DIFFERENTIAL/PLATELET -  Abnormal; Notable for the following components:      Result Value   Eosinophils Absolute 1.6 (*)    All other components within normal limits  COMPREHENSIVE METABOLIC PANEL - Abnormal; Notable for the following components:   Glucose, Bld 167 (*)    All other components within normal limits  RESP PANEL BY RT-PCR (FLU A&B, COVID) ARPGX2  BRAIN NATRIURETIC PEPTIDE  BLOOD GAS, VENOUS  TROPONIN I (HIGH SENSITIVITY)    On reevaluation patient did have some improvement of her shortness of breath and hypoxia following DuoNeb treatments and steroids.  Patient continues to require 2 L nasal cannula.  Patient will need admitted to the hospitalist for acute hypoxic respiratory failure in the setting of an asthma exacerbation.   PROCEDURES:  Critical Care performed: Yes  Procedures  Total critical care time: 45 minutes  Critical care time was exclusive of separately billable procedures and treating other patients.  Critical care was necessary to treat or prevent imminent or life-threatening deterioration.  Critical care was time spent personally by me on the following activities: development of treatment plan with patient and/or surrogate as well as nursing, discussions with consultants, evaluation of patient's response to treatment, examination of patient, obtaining history from patient or surrogate, ordering and performing treatments and interventions, ordering and review of laboratory studies, ordering and review of radiographic studies, pulse oximetry and re-evaluation of patient's condition.   Patient's presentation is most consistent with acute presentation with potential threat to life or bodily function.   MEDICATIONS ORDERED IN ED: Medications  ipratropium-albuterol (DUONEB) 0.5-2.5 (3) MG/3ML nebulizer solution 3 mL (3 mLs Nebulization Given 12/18/21 1506)  methylPREDNISolone sodium succinate (SOLU-MEDROL) 125 mg/2 mL injection 125 mg (125 mg Intravenous Given 12/18/21 1506)     FINAL CLINICAL IMPRESSION(S) / ED DIAGNOSES   Final diagnoses:  Hypoxia  Moderate persistent asthma with exacerbation     Rx / DC Orders   ED Discharge Orders     None        Note:  This document was prepared using Dragon voice recognition software and may include unintentional dictation errors.   Nathaniel Man, MD 12/18/21 1545

## 2021-12-18 NOTE — ED Notes (Signed)
Advised nurse that patient has ready bed 

## 2021-12-18 NOTE — Assessment & Plan Note (Signed)
-

## 2021-12-19 ENCOUNTER — Inpatient Hospital Stay: Payer: BC Managed Care – PPO

## 2021-12-19 DIAGNOSIS — E1169 Type 2 diabetes mellitus with other specified complication: Secondary | ICD-10-CM

## 2021-12-19 DIAGNOSIS — Z853 Personal history of malignant neoplasm of breast: Secondary | ICD-10-CM

## 2021-12-19 DIAGNOSIS — I10 Essential (primary) hypertension: Secondary | ICD-10-CM

## 2021-12-19 DIAGNOSIS — J4541 Moderate persistent asthma with (acute) exacerbation: Secondary | ICD-10-CM

## 2021-12-19 DIAGNOSIS — E669 Obesity, unspecified: Secondary | ICD-10-CM

## 2021-12-19 DIAGNOSIS — F32A Depression, unspecified: Secondary | ICD-10-CM

## 2021-12-19 DIAGNOSIS — R9389 Abnormal findings on diagnostic imaging of other specified body structures: Secondary | ICD-10-CM

## 2021-12-19 LAB — CBC
HCT: 44.4 % (ref 36.0–46.0)
Hemoglobin: 14.3 g/dL (ref 12.0–15.0)
MCH: 28.1 pg (ref 26.0–34.0)
MCHC: 32.2 g/dL (ref 30.0–36.0)
MCV: 87.2 fL (ref 80.0–100.0)
Platelets: 239 10*3/uL (ref 150–400)
RBC: 5.09 MIL/uL (ref 3.87–5.11)
RDW: 13.3 % (ref 11.5–15.5)
WBC: 5.9 10*3/uL (ref 4.0–10.5)
nRBC: 0 % (ref 0.0–0.2)

## 2021-12-19 LAB — HIV ANTIBODY (ROUTINE TESTING W REFLEX): HIV Screen 4th Generation wRfx: NONREACTIVE

## 2021-12-19 LAB — RESPIRATORY PANEL BY PCR

## 2021-12-19 LAB — BASIC METABOLIC PANEL
Anion gap: 11 (ref 5–15)
BUN: 14 mg/dL (ref 8–23)
CO2: 24 mmol/L (ref 22–32)
Calcium: 9.8 mg/dL (ref 8.9–10.3)
Chloride: 107 mmol/L (ref 98–111)
Creatinine, Ser: 0.75 mg/dL (ref 0.44–1.00)
GFR, Estimated: >60 mL/min (ref >60)
Glucose, Bld: 155 mg/dL — ABNORMAL HIGH (ref 70–99)
Potassium: 4 mmol/L (ref 3.5–5.1)
Sodium: 142 mmol/L (ref 135–145)

## 2021-12-19 LAB — GLUCOSE, CAPILLARY
Glucose-Capillary: 221 mg/dL — ABNORMAL HIGH (ref 70–99)
Glucose-Capillary: 134 mg/dL — ABNORMAL HIGH (ref 70–99)
Glucose-Capillary: 171 mg/dL — ABNORMAL HIGH (ref 70–99)
Glucose-Capillary: 181 mg/dL — ABNORMAL HIGH (ref 70–99)

## 2021-12-19 MED ORDER — METHYLPREDNISOLONE SODIUM SUCC 40 MG IJ SOLR
40.0000 mg | Freq: Once | INTRAMUSCULAR | Status: AC
  Administered 2021-12-19: 40 mg via INTRAVENOUS
  Filled 2021-12-19: qty 1

## 2021-12-19 MED ORDER — IOHEXOL 350 MG/ML SOLN
75.0000 mL | Freq: Once | INTRAVENOUS | Status: AC | PRN
  Administered 2021-12-19: 75 mL via INTRAVENOUS

## 2021-12-19 MED ORDER — SODIUM CHLORIDE 0.9 % IV SOLN
2.0000 g | INTRAVENOUS | Status: DC
  Administered 2021-12-19 – 2021-12-20 (×2): 2 g via INTRAVENOUS
  Filled 2021-12-19 (×3): qty 20

## 2021-12-19 MED ORDER — STERILE WATER FOR INJECTION IJ SOLN
INTRAMUSCULAR | Status: AC
  Administered 2021-12-19: 10 mL
  Filled 2021-12-19: qty 10

## 2021-12-19 MED ORDER — ACETYLCYSTEINE 20 % IN SOLN
3.0000 mL | Freq: Two times a day (BID) | RESPIRATORY_TRACT | Status: DC
  Administered 2021-12-19 (×2): 3 mL via RESPIRATORY_TRACT
  Filled 2021-12-19 (×3): qty 4

## 2021-12-19 NOTE — Assessment & Plan Note (Signed)
Focal occlusion of the bronchus intermedius with resultant obstructive atelectasis of the right middle lobe.  Case discussed with pulmonary and would like to do conservative management with Mucomyst nebulizers currently.  May end up needing a bronchoscopy at some point.  Currently on Zithromax.  Will add Rocephin.

## 2021-12-19 NOTE — Progress Notes (Signed)
Progress Note   Patient: Nancy Patton DOB: [birth year] DOA: 12/18/2021     1 DOS: the patient was seen and examined on 12/19/2021    Assessment and Plan: * Acute respiratory failure with hypoxia ([place]) Secondary to acute asthma exacerbation Room air pulse oximetry of 83%. Try to see if we can taper off oxygen.  Currently on 3 L of oxygen.  Acute asthma exacerbation Solu-Medrol today.  Continue nebulizers.  Abnormal CT scan, chest Focal occlusion of the bronchus intermedius with resultant obstructive atelectasis of the right middle lobe.  Case discussed with pulmonary and would like to do conservative management with Mucomyst nebulizers currently.  May end up needing a bronchoscopy at some point.  Currently on Zithromax.  Will add Rocephin.  HTN (hypertension) Continue losartan/HCTZ   Obesity BMI 37.93 with current height and weight in computer  History of breast cancer Status postlumpectomy Continue letrozole  Depression Continue citalopram  Type 2 diabetes mellitus ([place]) Hold metformin.  Currently on sliding scale insulin.  He remains ago hemoglobin A1c was 5.6        Subjective: Patient states lately she has had to sleep sitting up.  Very short of breath.  Can hardly walk to the bathroom.  Today feeling a little bit better.  Does not wear oxygen at home.  Currently on 3 L of oxygen.  Physical Exam: Vitals:   12/19/21 0351 12/19/21 0722 12/19/21 0844 12/19/21 1616  BP: 130/81 (!) 138/90 (!) 152/85 (!) 119/90  Pulse: 87 80 (!) 102 88  Resp: '20 20 16 16  '$ Temp: 98.5 F (36.9 C) 98.1 F (36.7 C) 98.2 F (36.8 C) 97.7 F (36.5 C)  TempSrc: Oral Axillary Oral Oral  SpO2: 98% 95% 92% 93%  Weight:      Height:       Physical Exam HENT:     Head: Normocephalic.     Mouth/Throat:     Pharynx: No oropharyngeal exudate.  Eyes:     General: Lids are normal.     Conjunctiva/sclera: Conjunctivae normal.  Cardiovascular:     Rate and Rhythm:  Normal rate and regular rhythm.     Heart sounds: Normal heart sounds, S1 normal and S2 normal.  Pulmonary:     Breath sounds: Examination of the right-middle field reveals decreased breath sounds and wheezing. Examination of the left-middle field reveals decreased breath sounds and wheezing. Examination of the right-lower field reveals decreased breath sounds and wheezing. Examination of the left-lower field reveals decreased breath sounds and wheezing. Decreased breath sounds and wheezing present. No rhonchi or rales.  Abdominal:     Palpations: Abdomen is soft.     Tenderness: There is no abdominal tenderness.  Musculoskeletal:     Right lower leg: No swelling.     Left lower leg: No swelling.  Skin:    General: Skin is warm.     Findings: No rash.  Neurological:     Mental Status: She is alert and oriented to person, place, and time.     Data Reviewed: CT angio of the chest shows a focal occlusion of the bronchus intermedius with resultant obstructive atelectasis of the right middle lobe.  Diffuse bronchial wall thickening about the central right lower lobe airways nonspecific could be seen in the setting of chronic infectious inflammatory etiology or secondary to endobronchial neoplasm. D-dimer elevated at 1.67, CBC within normal limits, creatinine 0.75, glucose 155  Family Communication: Family at bedside  Disposition: Status is: Inpatient Remains inpatient appropriate  because: Still diffusely wheezing.  Does not wear oxygen at home and currently on 3 L of oxygen.  Continue Solu-Medrol this afternoon.  Planned Discharge Destination: Home    Time spent: 28 minutes  Author: Loletha Grayer, MD 12/19/2021 6:04 PM  For on call review [email or web address].

## 2021-12-19 NOTE — Plan of Care (Signed)
  Problem: Activity: Goal: Ability to tolerate increased activity will improve Outcome: Progressing   Problem: Respiratory: Goal: Ability to maintain a clear airway will improve Outcome: Progressing Goal: Levels of oxygenation will improve Outcome: Progressing Goal: Ability to maintain adequate ventilation will improve Outcome: Progressing   Problem: Clinical Measurements: Goal: Respiratory complications will improve Outcome: Progressing   Problem: Activity: Goal: Risk for activity intolerance will decrease Outcome: Progressing   Problem: Nutrition: Goal: Adequate nutrition will be maintained Outcome: Progressing   Problem: Coping: Goal: Level of anxiety will decrease Outcome: Progressing   Problem: Safety: Goal: Ability to remain free from injury will improve Outcome: Progressing

## 2021-12-19 NOTE — Progress Notes (Signed)
PULMONOLOGY         Date: 12/19/2021,   MRN# [id number] Nancy Patton 1960/11/16     AdmissionWeight: 106.6 kg                 CurrentWeight: 106.6 kg  Referring provider: Dr Francine Graven   CHIEF COMPLAINT:   Acute hypoxemic respiratory failure   HISTORY OF PRESENT ILLNESS   This is a 61 year old female with arthritis, moderate persistent asthma with recurrent exacerbations, history of breast cancer, diabetes, essential hypertension, obesity, sleep apnea and pulmonary hypertension who came in for acute hypoxemic respiratory failure.  She previously had lumpectomy for breast cancer and is on letrozole.  She recently was discharged from the hospital in August for similar presentation at that time had CT chest with PE protocol which was negative for PE however showed small airway disease with signs of bronchiolitis consistent with asthma exacerbation.  At that time she was treated with typical care path for asthma exacerbation with steroids and antimicrobials as well as incentive spirometry and bronchopulmonary hygiene subsequently improved and was discharged home.  She did not have outpatient follow-up as recommended.  In the ER she required 4 L of oxygen to reach normoxia, she did receive IV steroids with Solu-Medrol as well as nebulizer treatments and improved quite quickly.  During my evaluation patient was able to speak in full sentences without use of accessory muscles of respiration or having any labored breathing.  She did have wheezing bilaterally which was mild to moderate.  She did not have chest, denied chest discomfort or any trauma or surgery.  Pulmonary consultation placed for additional evaluation and management of recurrent asthma exacerbations with rehospitalization.  12/19/21- patient noted to have occlusion of BI on CT chest. Will try non invasive modalities to clear mucus plugging. Options for bronchoscopy reviewed with patient     PAST MEDICAL HISTORY    Past Medical History:  Diagnosis Date   Arthritis    Asthma    Breast cancer ([place])    Diabetes mellitus without complication ([place])    Hypertension    Obesity    Personal history of radiation therapy    Pulmonary hypertension ([place])    Sleep apnea      SURGICAL HISTORY   Past Surgical History:  Procedure Laterality Date   BREAST LUMPECTOMY Right 05/23/2019   invasive DCIS radation    CATARACT EXTRACTION     CESAREAN SECTION     x 3      FAMILY HISTORY   Family History  Problem Relation Age of Onset   Diabetes Mother    Stroke Mother    Diabetes Father    Diabetes Brother    Cancer Neg Hx    Heart failure Neg Hx    Hypertension Neg Hx    Breast cancer Neg Hx      SOCIAL HISTORY   Social History   Tobacco Use   Smoking status: Former    Types: Cigarettes   Smokeless tobacco: Never   Tobacco comments:    Quit smoking 30 years ago.  Vaping Use   Vaping Use: Never used  Substance Use Topics   Alcohol use: No   Drug use: No     MEDICATIONS    Home Medication:     Current Medication:  Current Facility-Administered Medications:    acetaminophen (TYLENOL) tablet 650 mg, 650 mg, Oral, Q6H PRN **OR** acetaminophen (TYLENOL) suppository 650 mg, 650 mg, Rectal, Q6H PRN, Agbata, Tochukwu, MD  acetylcysteine (MUCOMYST) 20 % nebulizer / oral solution 3 mL, 3 mL, Nebulization, BID, Carter Kassel, MD   albuterol (PROVENTIL) (2.5 MG/3ML) 0.083% nebulizer solution 2.5 mg, 2.5 mg, Nebulization, Q2H PRN, Agbata, Tochukwu, MD   azithromycin (ZITHROMAX) tablet 250 mg, 250 mg, Oral, Daily, Lanney Gins, Owin Vignola, MD, 250 mg at 12/19/21 0914   citalopram (CELEXA) tablet 40 mg, 40 mg, Oral, Daily, Agbata, Tochukwu, MD, 40 mg at 12/19/21 0913   enoxaparin (LOVENOX) injection 52.5 mg, 0.5 mg/kg, Subcutaneous, Q24H, Agbata, Tochukwu, MD, 52.5 mg at 12/18/21 2113   fluticasone furoate-vilanterol (BREO ELLIPTA) 100-25 MCG/ACT 1 puff, 1 puff, Inhalation, Daily **AND**  [DISCONTINUED] umeclidinium bromide (INCRUSE ELLIPTA) 62.5 MCG/ACT 1 puff, 1 puff, Inhalation, Daily, Agbata, Tochukwu, MD   guaiFENesin (MUCINEX) 12 hr tablet 600-1,200 mg, 600-1,200 mg, Oral, BID PRN, Agbata, Tochukwu, MD   losartan (COZAAR) tablet 50 mg, 50 mg, Oral, Daily, 50 mg at 12/19/21 0913 **AND** hydrochlorothiazide (HYDRODIURIL) tablet 12.5 mg, 12.5 mg, Oral, Daily, Agbata, Tochukwu, MD, 12.5 mg at 12/19/21 0914   insulin aspart (novoLOG) injection 0-15 Units, 0-15 Units, Subcutaneous, TID WC, Agbata, Tochukwu, MD, 2 Units at 12/19/21 1317   ipratropium-albuterol (DUONEB) 0.5-2.5 (3) MG/3ML nebulizer solution 3 mL, 3 mL, Nebulization, Q6H, Agbata, Tochukwu, MD, 3 mL at 12/19/21 0746   methylPREDNISolone sodium succinate (SOLU-MEDROL) 40 mg/mL injection 40 mg, 40 mg, Intravenous, Q12H, 40 mg at 12/19/21 0352 **FOLLOWED BY** [START ON 12/20/2021] predniSONE (DELTASONE) tablet 40 mg, 40 mg, Oral, Q breakfast, Agbata, Tochukwu, MD   montelukast (SINGULAIR) tablet 10 mg, 10 mg, Oral, QHS, Agbata, Tochukwu, MD   ondansetron (ZOFRAN) tablet 4 mg, 4 mg, Oral, Q6H PRN **OR** ondansetron (ZOFRAN) injection 4 mg, 4 mg, Intravenous, Q6H PRN, Agbata, Tochukwu, MD   pantoprazole (PROTONIX) EC tablet 40 mg, 40 mg, Oral, Daily, Agbata, Tochukwu, MD, 40 mg at 12/19/21 0914    ALLERGIES   Patient has no known allergies.     REVIEW OF SYSTEMS    Review of Systems:  Gen:  Denies  fever, sweats, chills weigh loss  HEENT: Denies blurred vision, double vision, ear pain, eye pain, hearing loss, nose bleeds, sore throat Cardiac:  No dizziness, chest pain or heaviness, chest tightness,edema Resp:   reports dyspnea chronically  Gi: Denies swallowing difficulty, stomach pain, nausea or vomiting, diarrhea, constipation, bowel incontinence Gu:  Denies bladder incontinence, burning urine Ext:   Denies Joint pain, stiffness or swelling Skin: Denies  skin rash, easy bruising or bleeding or hives Endoc:   Denies polyuria, polydipsia , polyphagia or weight change Psych:   Denies depression, insomnia or hallucinations   Other:  All other systems negative   VS: BP (!) 152/85 (BP Location: Left Arm)   Pulse (!) 102   Temp 98.2 F (36.8 C) (Oral)   Resp 16   Ht _0  (1.676 m)   Wt 106.6 kg   SpO2 92%   BMI 37.93 kg/m      PHYSICAL EXAM    GENERAL:NAD, no fevers, chills, no weakness no fatigue HEAD: Normocephalic, atraumatic.  EYES: Pupils equal, round, reactive to light. Extraocular muscles intact. No scleral icterus.  MOUTH: Moist mucosal membrane. Dentition intact. No abscess noted.  EAR, NOSE, THROAT: Clear without exudates. No external lesions.  NECK: Supple. No thyromegaly. No nodules. No JVD.  PULMONARY: Expiratory wheezing bilaterally worse at the bases CARDIOVASCULAR: S1 and S2. Regular rate and rhythm. No murmurs, rubs, or gallops. No edema. Pedal pulses 2+ bilaterally.  GASTROINTESTINAL: Soft, nontender, nondistended. No masses.  Positive bowel sounds. No hepatosplenomegaly.  MUSCULOSKELETAL: No swelling, clubbing, or edema. Range of motion full in all extremities.  NEUROLOGIC: Cranial nerves II through XII are intact. No gross focal neurological deficits. Sensation intact. Reflexes intact.  SKIN: No ulceration, lesions, rashes, or cyanosis. Skin warm and dry. Turgor intact.  PSYCHIATRIC: Mood, affect within normal limits. The patient is awake, alert and oriented x 3. Insight, judgment intact.       IMAGING   Reviewed x-ray and there is a CT PE in process  ASSESSMENT/PLAN   Moderate to severe acute exacerbation of asthma  -Agree with IV Solu-Medrol, will reduce to 40 mg IV tomorrow and hoping to continue with p.o. taper prednisone -We will also test for respiratory viral panel for secondary causes due to viral organism -Respiratory cultures have been ordered -Laboratory biomarkers including ESR, CRP   -D-dimer is markedly elevated, there is CT PE process  moderate pretest probability of venous thromboembolism, due to her history of breast cancer currently on letrozole, and sedentary lifestyle   Diabetes type 2  -Close monitoring while on steroids with additional insulin via ISS ACHS    Morbid obesity Contributing to asthma adding exercise on outpatient  Obstructive sleep apnea May utilize CPAP with settings per respiratory therapy   Mild atelectasis Bronchopulmonary hygiene with incentive spirometry each hour while inpatient   Anticipate discharge within 24 to 48 hours.   Thank you for allowing me to participate in the care of this patient.   Patient/Family are satisfied with care plan and all questions have been answered.    Provider disclosure: Patient with at least one acute or chronic illness or injury that poses a threat to life or bodily function and is being managed actively during this encounter.  All of the below services have been performed independently by signing provider:  review of prior documentation from internal and or external health records.  Review of previous and current lab results.  Interview and comprehensive assessment during patient visit today. Review of current and previous chest radiographs/CT scans. Discussion of management and test interpretation with health care team and patient/family.   This document was prepared using Dragon voice recognition software and may include unintentional dictation errors.     Ottie Glazier, M.D.  Division of Pulmonary & Critical Care Medicine

## 2021-12-20 LAB — GLUCOSE, CAPILLARY
Glucose-Capillary: 193 mg/dL — ABNORMAL HIGH (ref 70–99)
Glucose-Capillary: 160 mg/dL — ABNORMAL HIGH (ref 70–99)
Glucose-Capillary: 281 mg/dL — ABNORMAL HIGH (ref 70–99)
Glucose-Capillary: 220 mg/dL — ABNORMAL HIGH (ref 70–99)

## 2021-12-20 MED ORDER — METHYLPREDNISOLONE SODIUM SUCC 40 MG IJ SOLR
40.0000 mg | Freq: Every day | INTRAMUSCULAR | Status: DC
  Administered 2021-12-20 – 2021-12-21 (×2): 40 mg via INTRAVENOUS
  Filled 2021-12-20 (×2): qty 1

## 2021-12-20 MED ORDER — ACETYLCYSTEINE 20 % IN SOLN
3.0000 mL | Freq: Two times a day (BID) | RESPIRATORY_TRACT | Status: DC
  Administered 2021-12-20 – 2021-12-21 (×3): 3 mL via RESPIRATORY_TRACT
  Filled 2021-12-20 (×4): qty 4

## 2021-12-20 NOTE — Progress Notes (Signed)
Progress Note   Patient: Nancy Patton RXY:[id number] DOB: May 19, 1960 DOA: 12/18/2021     2 DOS: the patient was seen and examined on 12/20/2021   Brief hospital course: 61 year old female with past medical history of asthma, breast cancer, type 2 diabetes, hypertension, obesity, pulmonary hypertension and sleep apnea.  She presents to the hospital with shortness of breath and wheezing.  Started on steroids.  CT angiogram showed no evidence of pulmonary embolism.  CT did show focal occlusion of the bronchus intermedius with resultant obstructive atelectasis of the right middle lobe could be chronic infectious/inflammatory or endobronchial neoplasm.  Pulmonary wanted to do conservative management with Mucomyst nebulizers.  May end up needing a bronchoscopy at some point.  Pulse ox 86% on room air with ambulation on 12/20/2021.  Assessment and Plan: * Acute respiratory failure with hypoxia ([place]) Secondary to acute asthma exacerbation Room air pulse oximetry of 83%. Today's pulse ox with ambulation dipped down to 86%.  Currently on 2 L.  Hopefully will be able to get off oxygen tomorrow.  Acute asthma exacerbation Given prednisone this morning.  I will give Solu-Medrol this afternoon.  Continue nebulizers.  Abnormal CT scan, chest Focal occlusion of the bronchus intermedius with resultant obstructive atelectasis of the right middle lobe.  Case discussed with pulmonary and would like to do conservative management with Mucomyst nebulizers currently.  May end up needing a bronchoscopy at some point.  Currently on Zithromax and Rocephin.  HTN (hypertension) Continue losartan/HCTZ   Obesity BMI 37.93 with current height and weight in computer  History of breast cancer Status postlumpectomy Continue letrozole  Depression Continue citalopram  Type 2 diabetes mellitus ([place]) Hold metformin.  Currently on sliding scale insulin.  He remains ago hemoglobin A1c was 5.6         Subjective: Patient feeling better than when she came in.  Still having some wheezing, cough and some shortness of breath.  Unable to come off oxygen today.  Physical Exam: Vitals:   12/19/21 1616 12/19/21 2032 12/20/21 0132 12/20/21 0545  BP: (!) 119/90   120/76  Pulse: 88   79  Resp: 16   20  Temp: 97.7 F (36.5 C)   98 F (36.7 C)  TempSrc: Oral   Oral  SpO2: 93% 95% 93% 100%  Weight:      Height:       Physical Exam HENT:     Head: Normocephalic.     Mouth/Throat:     Pharynx: No oropharyngeal exudate.  Eyes:     General: Lids are normal.     Conjunctiva/sclera: Conjunctivae normal.  Cardiovascular:     Rate and Rhythm: Normal rate and regular rhythm.     Heart sounds: Normal heart sounds, S1 normal and S2 normal.  Pulmonary:     Breath sounds: Examination of the right-lower field reveals decreased breath sounds and wheezing. Examination of the left-lower field reveals decreased breath sounds and wheezing. Decreased breath sounds and wheezing present. No rhonchi or rales.  Abdominal:     Palpations: Abdomen is soft.     Tenderness: There is no abdominal tenderness.  Musculoskeletal:     Right lower leg: No swelling.     Left lower leg: No swelling.  Skin:    General: Skin is warm.     Findings: No rash.  Neurological:     Mental Status: She is alert and oriented to person, place, and time.     Data Reviewed: CT scan from yesterday reviewed  with patient   Disposition: Status is: Inpatient Remains inpatient appropriate because: Unable to come off oxygen today.  Try again tomorrow.  Planned Discharge Destination: Home    Time spent: 28 minutes  Author: Loletha Grayer, MD 12/20/2021 3:52 PM  For on call review [email or web address].

## 2021-12-20 NOTE — Hospital Course (Signed)
61 year old female with past medical history of asthma, breast cancer, type 2 diabetes, hypertension, obesity, pulmonary hypertension and sleep apnea.  She presents to the hospital with shortness of breath and wheezing.  Started on steroids.  CT angiogram showed no evidence of pulmonary embolism.  CT did show focal occlusion of the bronchus intermedius with resultant obstructive atelectasis of the right middle lobe could be chronic infectious/inflammatory or endobronchial neoplasm.  Pulmonary wanted to do conservative management with Mucomyst nebulizers.  May end up needing a bronchoscopy at some point.  Pulse ox 86% on room air with ambulation on 12/20/2021.

## 2021-12-21 DIAGNOSIS — J9601 Acute respiratory failure with hypoxia: Secondary | ICD-10-CM | POA: Diagnosis not present

## 2021-12-21 DIAGNOSIS — E669 Obesity, unspecified: Secondary | ICD-10-CM | POA: Diagnosis not present

## 2021-12-21 DIAGNOSIS — J4541 Moderate persistent asthma with (acute) exacerbation: Secondary | ICD-10-CM | POA: Diagnosis not present

## 2021-12-21 LAB — CBC
HCT: 42 % (ref 36.0–46.0)
Hemoglobin: 13.7 g/dL (ref 12.0–15.0)
MCH: 28 pg (ref 26.0–34.0)
MCHC: 32.6 g/dL (ref 30.0–36.0)
MCV: 85.7 fL (ref 80.0–100.0)
Platelets: 262 10*3/uL (ref 150–400)
RBC: 4.9 MIL/uL (ref 3.87–5.11)
RDW: 13.2 % (ref 11.5–15.5)
WBC: 7.7 10*3/uL (ref 4.0–10.5)
nRBC: 0 % (ref 0.0–0.2)

## 2021-12-21 LAB — BASIC METABOLIC PANEL
Anion gap: 8 (ref 5–15)
BUN: 22 mg/dL (ref 8–23)
CO2: 29 mmol/L (ref 22–32)
Calcium: 9.5 mg/dL (ref 8.9–10.3)
Chloride: 103 mmol/L (ref 98–111)
Creatinine, Ser: 0.88 mg/dL (ref 0.44–1.00)
GFR, Estimated: >60 mL/min (ref >60)
Glucose, Bld: 123 mg/dL — ABNORMAL HIGH (ref 70–99)
Potassium: 3.8 mmol/L (ref 3.5–5.1)
Sodium: 140 mmol/L (ref 135–145)

## 2021-12-21 LAB — GLUCOSE, CAPILLARY
Glucose-Capillary: 119 mg/dL — ABNORMAL HIGH (ref 70–99)
Glucose-Capillary: 195 mg/dL — ABNORMAL HIGH (ref 70–99)

## 2021-12-21 MED ORDER — PREDNISONE 10 MG PO TABS: ORAL_TABLET | ORAL | 0 refills | Status: DC

## 2021-12-21 MED ORDER — AZITHROMYCIN 250 MG PO TABS: 250.0000 mg | ORAL_TABLET | Freq: Every day | ORAL | 0 refills | Status: AC

## 2021-12-21 NOTE — Progress Notes (Signed)
Mobility Specialist - Progress Note   12/21/21 1441  Mobility  Activity Ambulated independently in room  Level of Assistance Independent  Assistive Device None  Distance Ambulated (ft) 20 ft  Activity Response Tolerated well  Mobility Referral Yes  $Mobility charge 1 Mobility   [place]ie [place] Mobility Specialist 12/21/21 2:41 PM

## 2021-12-21 NOTE — TOC Initial Note (Addendum)
Transition of Care [facility]) - Initial/Assessment Note    Patient Details  Name: Nancy Patton MRN: [id number] Date of Birth: [birth year]  [facility]) CM/SW Contact:    Beverly Sessions, RN Phone Number: 12/21/2021, 10:32 AM  Clinical Narrative:                  Patient with high risk for readmission score Admitted for: Respiratory failure secondary to asthma  Admitted from: home with son PCP: Humphrey Rolls, and his NP [place]   Patient states that she has a new employer and her insurance benefits started today 11/1.  She does not have her insurance information, but confirms she will call patient registation to have that information added to her chart once she has obtained it   Per patient she ambulated on RA and maintained her saturations.  Message sent to bedside RN to confirm.   Son to transport at discharge   Update:  Nurse confirms patient has been weaned to RA with exertion         Patient Goals and CMS Choice        Expected Discharge Plan and Services                                                Prior Living Arrangements/Services                       Activities of Daily Living Home Assistive Devices/Equipment: None ADL Screening (condition at time of admission) Patient's cognitive ability adequate to safely complete daily activities?: Yes Is the patient deaf or have difficulty hearing?: No Does the patient have difficulty seeing, even when wearing glasses/contacts?: No Does the patient have difficulty concentrating, remembering, or making decisions?: No Patient able to express need for assistance with ADLs?: Yes Does the patient have difficulty dressing or bathing?: No Independently performs ADLs?: Yes (appropriate for developmental age) Does the patient have difficulty walking or climbing stairs?: No Weakness of Legs: None Weakness of Arms/Hands: None  Permission Sought/Granted                   Emotional Assessment              Admission diagnosis:  Hypoxia [R09.02] Moderate persistent asthma with exacerbation [J45.41] Acute asthma exacerbation [J45.901] Patient Active Problem List   Diagnosis Date Noted   Abnormal CT scan, chest 12/19/2021   Acute asthma exacerbation 12/18/2021   GERD (gastroesophageal reflux disease) 10/14/2021   Pulmonary hypertension ([place]) 10/14/2021   Obesity 10/14/2021   Hypokalemia 10/14/2021   Asthma exacerbation 10/13/2021   Acute respiratory failure with hypoxia ([place]) 07/06/2021   CAP (community acquired pneumonia) 07/06/2021   Sepsis ([place]) 07/05/2021   Multifocal pneumonia 05/13/2021   History of breast cancer 11/06/2020   Status post breast lumpectomy 05/29/2019   Cancer of right breast, stage 1, estrogen receptor positive ([place]) 05/15/2019   COPD exacerbation ([place]) 08/24/2015   HTN (hypertension) 07/10/2015   Type 2 diabetes mellitus ([place]) 07/10/2015   Depression 07/10/2015   Acute respiratory distress 09/26/2014   Asthmatic bronchitis with acute exacerbation 09/26/2014   Asthmatic bronchitis with exacerbation 09/26/2014   PCP:  Perrin Maltese, MD Pharmacy:   [id number] [place] [address]Phone: [phone]: [phone]  Social Determinants of Health (SDOH) Interventions    Readmission Risk Interventions    12/21/2021   10:31 AM  Readmission Risk Prevention Plan  Transportation Screening Complete  HRI or [place] Not Complete  HRI or Home Care Consult comments Not indicated  Social Work Consult for [place] Planning/Counseling Complete  Palliative Care Screening Not Applicable  Medication Review Press photographer) Complete

## 2021-12-21 NOTE — Progress Notes (Signed)
PULMONOLOGY         Date: 12/21/2021,   MRN# [id number] Nancy Patton 08/22/1960     AdmissionWeight: 106.6 kg                 CurrentWeight: 106.6 kg  Referring provider: Dr Francine Graven   CHIEF COMPLAINT:   Acute hypoxemic respiratory failure   HISTORY OF PRESENT ILLNESS   This is a 61 year old female with arthritis, moderate persistent asthma with recurrent exacerbations, history of breast cancer, diabetes, essential hypertension, obesity, sleep apnea and pulmonary hypertension who came in for acute hypoxemic respiratory failure.  She previously had lumpectomy for breast cancer and is on letrozole.  She recently was discharged from the hospital in August for similar presentation at that time had CT chest with PE protocol which was negative for PE however showed small airway disease with signs of bronchiolitis consistent with asthma exacerbation.  At that time she was treated with typical care path for asthma exacerbation with steroids and antimicrobials as well as incentive spirometry and bronchopulmonary hygiene subsequently improved and was discharged home.  She did not have outpatient follow-up as recommended.  In the ER she required 4 L of oxygen to reach normoxia, she did receive IV steroids with Solu-Medrol as well as nebulizer treatments and improved quite quickly.  During my evaluation patient was able to speak in full sentences without use of accessory muscles of respiration or having any labored breathing.  She did have wheezing bilaterally which was mild to moderate.  She did not have chest, denied chest discomfort or any trauma or surgery.  Pulmonary consultation placed for additional evaluation and management of recurrent asthma exacerbations with rehospitalization.  12/19/21- patient noted to have occlusion of BI on CT chest. Will try non invasive modalities to clear mucus plugging. Options for bronchoscopy reviewed with patient    12/21/21- patient is cleared  for dc home and should have outpatient follow within 2wk post dc   PAST MEDICAL HISTORY   Past Medical History:  Diagnosis Date   Arthritis    Asthma    Breast cancer ([place])    Diabetes mellitus without complication ([place])    Hypertension    Obesity    Personal history of radiation therapy    Pulmonary hypertension ([place])    Sleep apnea      SURGICAL HISTORY   Past Surgical History:  Procedure Laterality Date   BREAST LUMPECTOMY Right 05/23/2019   invasive DCIS radation    CATARACT EXTRACTION     CESAREAN SECTION     x 3      FAMILY HISTORY   Family History  Problem Relation Age of Onset   Diabetes Mother    Stroke Mother    Diabetes Father    Diabetes Brother    Cancer Neg Hx    Heart failure Neg Hx    Hypertension Neg Hx    Breast cancer Neg Hx      SOCIAL HISTORY   Social History   Tobacco Use   Smoking status: Former    Types: Cigarettes   Smokeless tobacco: Never   Tobacco comments:    Quit smoking 30 years ago.  Vaping Use   Vaping Use: Never used  Substance Use Topics   Alcohol use: No   Drug use: No     MEDICATIONS    Home Medication:     Current Medication:  Current Facility-Administered Medications:    acetaminophen (TYLENOL) tablet 650 mg, 650 mg,  Oral, Q6H PRN **OR** acetaminophen (TYLENOL) suppository 650 mg, 650 mg, Rectal, Q6H PRN, Agbata, Tochukwu, MD   acetylcysteine (MUCOMYST) 20 % nebulizer / oral solution 3 mL, 3 mL, Nebulization, BID, Wieting, Richard, MD, 3 mL at 12/21/21 0739   albuterol (PROVENTIL) (2.5 MG/3ML) 0.083% nebulizer solution 2.5 mg, 2.5 mg, Nebulization, Q2H PRN, Agbata, Tochukwu, MD   azithromycin (ZITHROMAX) tablet 250 mg, 250 mg, Oral, Daily, Maisen Schmit, MD, 250 mg at 12/21/21 0918   cefTRIAXone (ROCEPHIN) 2 g in sodium chloride 0.9 % 100 mL IVPB, 2 g, Intravenous, Q24H, Wieting, Richard, MD, Last Rate: 200 mL/hr at 12/20/21 2103, 2 g at 12/20/21 2103   citalopram (CELEXA) tablet 40 mg, 40 mg, Oral,  Daily, Agbata, Tochukwu, MD, 40 mg at 12/21/21 0918   enoxaparin (LOVENOX) injection 52.5 mg, 0.5 mg/kg, Subcutaneous, Q24H, Agbata, Tochukwu, MD, 52.5 mg at 12/20/21 2104   fluticasone furoate-vilanterol (BREO ELLIPTA) 100-25 MCG/ACT 1 puff, 1 puff, Inhalation, Daily, 1 puff at 12/21/21 0919 **AND** [DISCONTINUED] umeclidinium bromide (INCRUSE ELLIPTA) 62.5 MCG/ACT 1 puff, 1 puff, Inhalation, Daily, Agbata, Tochukwu, MD   guaiFENesin (MUCINEX) 12 hr tablet 600-1,200 mg, 600-1,200 mg, Oral, BID PRN, Agbata, Tochukwu, MD   losartan (COZAAR) tablet 50 mg, 50 mg, Oral, Daily, 50 mg at 12/21/21 0918 **AND** hydrochlorothiazide (HYDRODIURIL) tablet 12.5 mg, 12.5 mg, Oral, Daily, Agbata, Tochukwu, MD, 12.5 mg at 12/21/21 0918   insulin aspart (novoLOG) injection 0-15 Units, 0-15 Units, Subcutaneous, TID WC, Agbata, Tochukwu, MD, 3 Units at 12/21/21 1156   ipratropium-albuterol (DUONEB) 0.5-2.5 (3) MG/3ML nebulizer solution 3 mL, 3 mL, Nebulization, Q6H, Agbata, Tochukwu, MD, 3 mL at 12/21/21 0739   methylPREDNISolone sodium succinate (SOLU-MEDROL) 40 mg/mL injection 40 mg, 40 mg, Intravenous, Daily, Wieting, Richard, MD, 40 mg at 12/21/21 0918   montelukast (SINGULAIR) tablet 10 mg, 10 mg, Oral, QHS, Agbata, Tochukwu, MD, 10 mg at 12/20/21 2104   ondansetron (ZOFRAN) tablet 4 mg, 4 mg, Oral, Q6H PRN **OR** ondansetron (ZOFRAN) injection 4 mg, 4 mg, Intravenous, Q6H PRN, Agbata, Tochukwu, MD   pantoprazole (PROTONIX) EC tablet 40 mg, 40 mg, Oral, Daily, Agbata, Tochukwu, MD, 40 mg at 12/21/21 2330    ALLERGIES   Patient has no known allergies.     REVIEW OF SYSTEMS    Review of Systems:  Gen:  Denies  fever, sweats, chills weigh loss  HEENT: Denies blurred vision, double vision, ear pain, eye pain, hearing loss, nose bleeds, sore throat Cardiac:  No dizziness, chest pain or heaviness, chest tightness,edema Resp:   reports dyspnea chronically  Gi: Denies swallowing difficulty, stomach pain,  nausea or vomiting, diarrhea, constipation, bowel incontinence Gu:  Denies bladder incontinence, burning urine Ext:   Denies Joint pain, stiffness or swelling Skin: Denies  skin rash, easy bruising or bleeding or hives Endoc:  Denies polyuria, polydipsia , polyphagia or weight change Psych:   Denies depression, insomnia or hallucinations   Other:  All other systems negative   VS: BP 122/81 (BP Location: Left Arm)   Pulse 70   Temp 98.3 F (36.8 C) (Oral)   Resp 20   Ht _0  (1.676 m)   Wt 106.6 kg   SpO2 95%   BMI 37.93 kg/m      PHYSICAL EXAM    GENERAL:NAD, no fevers, chills, no weakness no fatigue HEAD: Normocephalic, atraumatic.  EYES: Pupils equal, round, reactive to light. Extraocular muscles intact. No scleral icterus.  MOUTH: Moist mucosal membrane. Dentition intact. No abscess noted.  EAR, NOSE, THROAT:  Clear without exudates. No external lesions.  NECK: Supple. No thyromegaly. No nodules. No JVD.  PULMONARY: Expiratory wheezing bilaterally worse at the bases CARDIOVASCULAR: S1 and S2. Regular rate and rhythm. No murmurs, rubs, or gallops. No edema. Pedal pulses 2+ bilaterally.  GASTROINTESTINAL: Soft, nontender, nondistended. No masses. Positive bowel sounds. No hepatosplenomegaly.  MUSCULOSKELETAL: No swelling, clubbing, or edema. Range of motion full in all extremities.  NEUROLOGIC: Cranial nerves II through XII are intact. No gross focal neurological deficits. Sensation intact. Reflexes intact.  SKIN: No ulceration, lesions, rashes, or cyanosis. Skin warm and dry. Turgor intact.  PSYCHIATRIC: Mood, affect within normal limits. The patient is awake, alert and oriented x 3. Insight, judgment intact.       IMAGING   Reviewed x-ray and there is a CT PE in process  ASSESSMENT/PLAN   Moderate to severe acute exacerbation of asthma  -Agree with IV Solu-Medrol, will reduce to 40 mg IV tomorrow and hoping to continue with p.o. taper prednisone -We will also  test for respiratory viral panel for secondary causes due to viral organism -Respiratory cultures have been ordered -Laboratory biomarkers including ESR, CRP   -D-dimer is markedly elevated, there is CT PE process moderate pretest probability of venous thromboembolism, due to her history of breast cancer currently on letrozole, and sedentary lifestyle   Diabetes type 2  -Close monitoring while on steroids with additional insulin via ISS ACHS    Morbid obesity Contributing to asthma adding exercise on outpatient  Obstructive sleep apnea May utilize CPAP with settings per respiratory therapy   Mild atelectasis Bronchopulmonary hygiene with incentive spirometry each hour while inpatient   Anticipate discharge within 24 to 48 hours.   Thank you for allowing me to participate in the care of this patient.   Patient/Family are satisfied with care plan and all questions have been answered.    Provider disclosure: Patient with at least one acute or chronic illness or injury that poses a threat to life or bodily function and is being managed actively during this encounter.  All of the below services have been performed independently by signing provider:  review of prior documentation from internal and or external health records.  Review of previous and current lab results.  Interview and comprehensive assessment during patient visit today. Review of current and previous chest radiographs/CT scans. Discussion of management and test interpretation with health care team and patient/family.   This document was prepared using Dragon voice recognition software and may include unintentional dictation errors.     Ottie Glazier, M.D.  Division of Pulmonary & Critical Care Medicine

## 2021-12-21 NOTE — Discharge Summary (Signed)
Physician Discharge Summary  Nancy Patton INO:[id number] DOB: 1960-04-26 DOA: 12/18/2021  PCP: Perrin Maltese, MD  Admit date: 12/18/2021 Discharge date: 12/21/2021  Admitted From: home  Disposition:  home   Recommendations for Outpatient Follow-up:  Follow up with PCP in 1-2 weeks F/u w/ pulmon, Dr. Lanney Gins, in 2 weeks  Home Health: no  Equipment/Devices:  Discharge Condition: stable  CODE STATUS: full  Diet recommendation: Heart Healthy / Carb Modified   Brief/Interim Summary: HPI was taken from Dr. Francine Graven: Nancy Patton is a 61 y.o. female with medical history significant for asthma, obesity (BMI 37), diabetes mellitus, history of breast cancer status postlumpectomy and currently on hormonal therapy with letrozole who was discharged from the hospital in 08/23 after hospitalization for severe asthma exacerbation with hypoxemia. She presents to the ER today for evaluation of a 3-day history of shortness of breath with mild to moderate exertion associated with wheezing and a cough productive of clear phlegm.  She has used her home nebulizer treatments without any significant improvement.  She states that she was seen by the pulmonologist after her hospital discharge in August and he was going to start her on a new medication but she has not been able to get it due to insurance issues. She was on a course of steroids for over a month and states that her symptoms started as soon as she completed her steroid therapy. She denies having any chest pain, no fever, no chills, no palpitations, no diaphoresis, no abdominal pain, no urinary symptoms, no changes in her bowel habits, no headache, no blurred vision no focal deficits. She denies any nicotine use and does not vape. Her room air pulse oximetry upon arrival to the ER was 83% and she was tachypneic with respiratory rate of 26. She initially required 4 L of oxygen to improve pulse oximetry to 95% and was noted to have  expiratory wheezes in all lung fields. She received a dose of methylprednisolone and DuoNeb and will be admitted to the hospital for further evaluation.    As per Dr. Leslye Peer: 61 year old female with past medical history of asthma, breast cancer, type 2 diabetes, hypertension, obesity, pulmonary hypertension and sleep apnea.  She presents to the hospital with shortness of breath and wheezing.  Started on steroids.  CT angiogram showed no evidence of pulmonary embolism.  CT did show focal occlusion of the bronchus intermedius with resultant obstructive atelectasis of the right middle lobe could be chronic infectious/inflammatory or endobronchial neoplasm.  Pulmonary wanted to do conservative management with Mucomyst nebulizers.  May end up needing a bronchoscopy at some point.  Pulse ox 86% on room air with ambulation on 12/20/2021.  As per Dr. Jimmye Norman 12/21/21: Pt was able to be weaned off of supplemental oxygen prior to d/c. Pt was d/c home w/ po azithromycin and steroid taper. Pt will need to f/u w/ pulmon, Dr. Lanney Gins in 2 weeks. Pt verbalized her understanding     Discharge Diagnoses:  Principal Problem:   Acute respiratory failure with hypoxia (HCC) Active Problems:   Acute asthma exacerbation   Abnormal CT scan, chest   HTN (hypertension)   Type 2 diabetes mellitus (HCC)   Depression   History of breast cancer   Obesity  Acute hypoxic respiratory failure: secondary to asthma exacerbation. Weaned off of supplemental oxygen    Acute asthma exacerbation: continue on azithromycin, steroids & bronchodilators   Abnormal CT chest : focal occlusion of the bronchus intermedius with resultant obstructive atelectasis of  the right middle lobe.  Case discussed with pulmonary and would like to do conservative management with Mucomyst nebulizers currently.  May need a bronchoscopy at some point.  Currently on Zithromax and Rocephin.   HTN: continue on losartan, HCTZ  Obesity: BMI 37.9.  Complicates overall care & prognosis    Hx of breast cancer: s/p lumpectomy. Continue on letrozole    Depression: severity unknown. Continue on citalopram    DM2: well controlled, HbA1c 5.6. Continue on SSI w/ accuchecks  Discharge Instructions  Discharge Instructions     Diet - low sodium heart healthy   Complete by: As directed    Diet Carb Modified   Complete by: As directed    Discharge instructions   Complete by: As directed    F/u w/ PCP in 1-2 weeks. F/u w/ pulmon, Dr. Lanney Gins, in 2 weeks   Increase activity slowly   Complete by: As directed       Allergies as of 12/21/2021   No Known Allergies      Medication List     TAKE these medications    albuterol 108 (90 Base) MCG/ACT inhaler Commonly known as: VENTOLIN HFA Inhale 2 puffs into the lungs every 6 (six) hours as needed for wheezing or shortness of breath.   azithromycin 250 MG tablet Commonly known as: ZITHROMAX Take 1 tablet (250 mg total) by mouth daily for 2 days.   citalopram 40 MG tablet Commonly known as: CELEXA Take 40 mg by mouth daily.   guaiFENesin 600 MG 12 hr tablet Commonly known as: MUCINEX Take 1-2 tablets (600-1,200 mg total) by mouth 2 (two) times daily as needed.   ipratropium-albuterol 0.5-2.5 (3) MG/3ML Soln Commonly known as: DUONEB Take 3 mLs by nebulization every 4 (four) hours as needed (shortness of breath / wheeze).   letrozole 2.5 MG tablet Commonly known as: FEMARA TAKE 1 TABLET BY MOUTH EVERY DAY (DON'T START MED UNTIL YOU COMPLETE RADIATION 1ST)   losartan-hydrochlorothiazide 50-12.5 MG tablet Commonly known as: HYZAAR Take 1 tablet by mouth daily.   montelukast 10 MG tablet Commonly known as: SINGULAIR Take 1 tablet (10 mg total) by mouth every morning.   pantoprazole 40 MG tablet Commonly known as: PROTONIX Take 1 tablet (40 mg total) by mouth daily.   predniSONE 10 MG tablet Commonly known as: DELTASONE Prednisone '40mg'$  daily x 4 days, '30mg'$  daily x 4  days, '20mg'$  daily x 4 days, '10mg'$  daily x 4 days then stop   tirzepatide 5 MG/0.5ML Pen Commonly known as: MOUNJARO Inject 5 mg into the skin every Saturday.   Trelegy Ellipta 100-62.5-25 MCG/ACT Aepb Generic drug: Fluticasone-Umeclidin-Vilant Take 1 puff by mouth daily.        Follow-up Information     Ottie Glazier, MD Follow up.   Specialty: Pulmonary Disease Why: F/u in 2 weeks Contact information: [place] [address] [phone]         Perrin Maltese, MD Follow up.   Specialty: Internal Medicine Why: F/u in 1-2 weeks Contact information: [place] [address] [phone]                No Known Allergies  Consultations: Pulmon    Procedures/Studies: CT Angio Chest Pulmonary Embolism (PE) W or WO Contrast  Result Date: 12/19/2021 CLINICAL DATA:  61 year old female, concern for pulmonary embolism. EXAM: CT ANGIOGRAPHY CHEST WITH CONTRAST TECHNIQUE: Multidetector CT imaging of the chest was performed using the standard protocol during bolus administration of  intravenous contrast. Multiplanar CT image reconstructions and MIPs were obtained to evaluate the vascular anatomy. RADIATION DOSE REDUCTION: This exam was performed according to the departmental dose-optimization program which includes automated exposure control, adjustment of the mA and/or kV according to patient size and/or use of iterative reconstruction technique. CONTRAST:  Seventy-five mL Omnipaque 350, intravenous COMPARISON:  10/13/2021 FINDINGS: Cardiovascular: Satisfactory opacification of the pulmonary arteries to the segmental level. No evidence of pulmonary embolism. Normal heart size. No pericardial effusion. Mediastinum/Nodes: No enlarged mediastinal, hilar, or axillary lymph nodes. Thyroid gland, trachea, and esophagus demonstrate no significant findings. Lungs/Pleura: Interval focal occlusion of the proximal bronchus intermedius with resultant  atelectasis of the right middle lobe. Diffuse bronchial wall thickening about the central airways of the right lower lobe. The left lung is clear. No suspicious pulmonary nodules. No pleural effusion or pneumothorax peer Upper Abdomen: Cholelithiasis without evidence of cholecystitis. The remaining visualized upper abdomen is within normal limits. Musculoskeletal: No acute osseous abnormality. Mild multilevel degenerative changes of the thoracic spine. Review of the MIP images confirms the above findings. IMPRESSION: Vascular: No evidence of pulmonary embolism. Non-Vascular: 1. Focal occlusion of the bronchus intermedius with resultant obstructive atelectasis of the right middle lobe. Diffuse bronchial wall thickening about the central right lower lobe airways. These findings are nonspecific but could be seen in the setting of chronic infectious/inflammatory etiology or secondary to endobronchial neoplasm. 2. Cholelithiasis. Ruthann Cancer, MD [facility] Radiology Electronically Signed   By: Ruthann Cancer M.D.   On: 12/19/2021 11:03   DG Chest Port 1 View  Result Date: 12/18/2021 CLINICAL DATA:  Shortness of breath EXAM: PORTABLE CHEST 1 VIEW COMPARISON:  Chest radiograph dated 10/13/2021. FINDINGS: The heart size and mediastinal contours are within normal limits. Both lungs are clear. The visualized skeletal structures are unremarkable. IMPRESSION: No active disease. Electronically Signed   By: Zerita Boers M.D.   On: 12/18/2021 14:55   (Echo, Carotid, EGD, Colonoscopy, ERCP)    Subjective: Pt denies any complaints    Discharge Exam: Vitals:   12/20/21 2000 12/21/21 0501  BP: 118/61 122/81  Pulse: 87 70  Resp: 18 20  Temp: 98.4 F (36.9 C) 98.3 F (36.8 C)  SpO2: 96% 95%   Vitals:   12/20/21 1700 12/20/21 1903 12/20/21 2000 12/21/21 0501  BP: 118/75  118/61 122/81  Pulse: 80  87 70  Resp: '18  18 20  '$ Temp: 98.4 F (36.9 C)  98.4 F (36.9  C) 98.3 F (36.8 C)  TempSrc:   Oral Oral  SpO2: 95% 94% 96% 95%  Weight:      Height:        General: Pt is alert, awake, not in acute distress Cardiovascular: S1/S2 +, no rubs, no gallops Respiratory: CTA bilaterally, no wheezing, no rhonchi Abdominal: Soft, NT, obese, bowel sounds + Extremities: no cyanosis    The results of significant diagnostics from this hospitalization (including imaging, microbiology, ancillary and laboratory) are listed below for reference.     Microbiology: Recent Results (from the past 240 hour(s))  Resp Panel by RT-PCR (Flu A&B, Covid) Anterior Nasal Swab     Status: None   Collection Time: 12/18/21  3:05 PM   Specimen: Anterior Nasal Swab  Result Value Ref Range Status   SARS Coronavirus 2 by RT PCR NEGATIVE NEGATIVE Final    Comment: (NOTE) SARS-CoV-2 target nucleic acids are NOT DETECTED.  The SARS-CoV-2 RNA is generally detectable in upper respiratory specimens during the acute  phase of infection. The lowest concentration of SARS-CoV-2 viral copies this assay can detect is 138 copies/mL. A negative result does not preclude SARS-Cov-2 infection and should not be used as the sole basis for treatment or other patient management decisions. A negative result may occur with  improper specimen collection/handling, submission of specimen other than nasopharyngeal swab, presence of viral mutation(s) within the areas targeted by this assay, and inadequate number of viral copies(<138 copies/mL). A negative result must be combined with clinical observations, patient history, and epidemiological information. The expected result is Negative.  Fact Sheet for Patients:  EntrepreneurPulse.com.au  Fact Sheet for Healthcare Providers:  IncredibleEmployment.be  This test is no t yet approved or cleared by the [place] FDA and  has been authorized for detection and/or diagnosis of SARS-CoV-2 by FDA under an  Emergency Use Authorization (EUA). This EUA will remain  in effect (meaning this test can be used) for the duration of the COVID-19 declaration under Section 564(b)(1) of the Act, 21 U.S.C.section 360bbb-3(b)(1), unless the authorization is terminated  or revoked sooner.       Influenza A by PCR NEGATIVE NEGATIVE Final   Influenza B by PCR NEGATIVE NEGATIVE Final    Comment: (NOTE) The Xpert Xpress SARS-CoV-2/FLU/RSV plus assay is intended as an aid in the diagnosis of influenza from Nasopharyngeal swab specimens and should not be used as a sole basis for treatment. Nasal washings and aspirates are unacceptable for Xpert Xpress SARS-CoV-2/FLU/RSV testing.  Fact Sheet for Patients: EntrepreneurPulse.com.au  Fact Sheet for Healthcare Providers: IncredibleEmployment.be  This test is not yet approved or cleared by the [place] FDA and has been authorized for detection and/or diagnosis of SARS-CoV-2 by FDA under an Emergency Use Authorization (EUA). This EUA will remain in effect (meaning this test can be used) for the duration of the COVID-19 declaration under Section 564(b)(1) of the Act, 21 U.S.C. section 360bbb-3(b)(1), unless the authorization is terminated or revoked.  Performed at [facility], [address] [place], [place] [address]   Respiratory (~20 pathogens) panel by PCR     Status: None   Collection Time: 12/18/21  6:27 PM   Specimen: Nasopharyngeal Swab; Respiratory  Result Value Ref Range Status   Adenovirus NOT DETECTED NOT DETECTED Final   Coronavirus 229E NOT DETECTED NOT DETECTED Final    Comment: (NOTE) The Coronavirus on the Respiratory Panel, DOES NOT test for the novel  Coronavirus (2019 nCoV)    Coronavirus HKU1 NOT DETECTED NOT DETECTED Final   Coronavirus NL63 NOT DETECTED NOT DETECTED Final   Coronavirus OC43 NOT DETECTED NOT DETECTED Final   Metapneumovirus NOT DETECTED NOT DETECTED Final    Rhinovirus / Enterovirus NOT DETECTED NOT DETECTED Final   Influenza A NOT DETECTED NOT DETECTED Final   Influenza B NOT DETECTED NOT DETECTED Final   Parainfluenza Virus 1 NOT DETECTED NOT DETECTED Final   Parainfluenza Virus 2 NOT DETECTED NOT DETECTED Final   Parainfluenza Virus 3 NOT DETECTED NOT DETECTED Final   Parainfluenza Virus 4 NOT DETECTED NOT DETECTED Final   Respiratory Syncytial Virus NOT DETECTED NOT DETECTED Final   Bordetella pertussis NOT DETECTED NOT DETECTED Final   Bordetella Parapertussis NOT DETECTED NOT DETECTED Final   Chlamydophila pneumoniae NOT DETECTED NOT DETECTED Final   Mycoplasma pneumoniae NOT DETECTED NOT DETECTED Final    Comment: Performed at [facility], [place]. [address] [place], [place] [address]     Labs: BNP (last 3 results) Recent Labs  08/29/21 0703 10/13/21 1542 12/18/21 1437  BNP 18.6 9.4 78.5   Basic Metabolic Panel: Recent Labs  Lab 12/18/21 1439 12/19/21 0512 12/21/21 0631  NA 141 142 140  K 3.6 4.0 3.8  CL 111 107 103  CO2 '24 24 29  '$ GLUCOSE 167* 155* 123*  BUN '14 14 22  '$ CREATININE 0.76 0.75 0.88  CALCIUM 8.9 9.8 9.5   Liver Function Tests: Recent Labs  Lab 12/18/21 1439  AST 21  ALT 15  ALKPHOS 53  BILITOT 0.5  PROT 6.9  ALBUMIN 4.0   No results for input(s): "LIPASE", "AMYLASE" in the last 168 hours. No results for input(s): "AMMONIA" in the last 168 hours. CBC: Recent Labs  Lab 12/18/21 1439 12/19/21 0512 12/21/21 0631  WBC 6.2 5.9 7.7  NEUTROABS 2.4  --   --   HGB 12.7 14.3 13.7  HCT 39.8 44.4 42.0  MCV 87.9 87.2 85.7  PLT 227 239 262   Cardiac Enzymes: No results for input(s): "CKTOTAL", "CKMB", "CKMBINDEX", "TROPONINI" in the last 168 hours. BNP: Invalid input(s): "POCBNP" CBG: Recent Labs  Lab 12/20/21 1235 12/20/21 1706 12/20/21 2121 12/21/21 0822 12/21/21 1126  GLUCAP 160* 281* 220* 119* 195*   D-Dimer Recent Labs    12/18/21 1826  DDIMER 1.67*   Hgb A1c No results  for input(s): "HGBA1C" in the last 72 hours. Lipid Profile No results for input(s): "CHOL", "HDL", "LDLCALC", "TRIG", "CHOLHDL", "LDLDIRECT" in the last 72 hours. Thyroid function studies No results for input(s): "TSH", "T4TOTAL", "T3FREE", "THYROIDAB" in the last 72 hours.  Invalid input(s): "FREET3" Anemia work up No results for input(s): "VITAMINB12", "FOLATE", "FERRITIN", "TIBC", "IRON", "RETICCTPCT" in the last 72 hours. Urinalysis    Component Value Date/Time   COLORURINE YELLOW (A) 07/06/2021 0352   APPEARANCEUR HAZY (A) 07/06/2021 0352   LABSPEC 1.031 (H) 07/06/2021 0352   PHURINE 5.0 07/06/2021 0352   GLUCOSEU NEGATIVE 07/06/2021 0352   HGBUR NEGATIVE 07/06/2021 0352   BILIRUBINUR NEGATIVE 07/06/2021 0352   KETONESUR 20 (A) 07/06/2021 0352   PROTEINUR NEGATIVE 07/06/2021 0352   NITRITE NEGATIVE 07/06/2021 0352   LEUKOCYTESUR MODERATE (A) 07/06/2021 0352   Sepsis Labs Recent Labs  Lab 12/18/21 1439 12/19/21 0512 12/21/21 0631  WBC 6.2 5.9 7.7   Microbiology Recent Results (from the past 240 hour(s))  Resp Panel by RT-PCR (Flu A&B, Covid) Anterior Nasal Swab     Status: None   Collection Time: 12/18/21  3:05 PM   Specimen: Anterior Nasal Swab  Result Value Ref Range Status   SARS Coronavirus 2 by RT PCR NEGATIVE NEGATIVE Final    Comment: (NOTE) SARS-CoV-2 target nucleic acids are NOT DETECTED.  The SARS-CoV-2 RNA is generally detectable in upper respiratory specimens during the acute phase of infection. The lowest concentration of SARS-CoV-2 viral copies this assay can detect is 138 copies/mL. A negative result does not preclude SARS-Cov-2 infection and should not be used as the sole basis for treatment or other patient management decisions. A negative result may occur with  improper specimen collection/handling, submission of specimen other than nasopharyngeal swab, presence of viral mutation(s) within the areas targeted by this assay, and inadequate number  of viral copies(<138 copies/mL). A negative result must be combined with clinical observations, patient history, and epidemiological information. The expected result is Negative.  Fact Sheet for Patients:  EntrepreneurPulse.com.au  Fact Sheet for Healthcare Providers:  IncredibleEmployment.be  This test is no t yet approved or cleared by the [place] FDA and  has been  authorized for detection and/or diagnosis of SARS-CoV-2 by FDA under an Emergency Use Authorization (EUA). This EUA will remain  in effect (meaning this test can be used) for the duration of the COVID-19 declaration under Section 564(b)(1) of the Act, 21 U.S.C.section 360bbb-3(b)(1), unless the authorization is terminated  or revoked sooner.       Influenza A by PCR NEGATIVE NEGATIVE Final   Influenza B by PCR NEGATIVE NEGATIVE Final    Comment: (NOTE) The Xpert Xpress SARS-CoV-2/FLU/RSV plus assay is intended as an aid in the diagnosis of influenza from Nasopharyngeal swab specimens and should not be used as a sole basis for treatment. Nasal washings and aspirates are unacceptable for Xpert Xpress SARS-CoV-2/FLU/RSV testing.  Fact Sheet for Patients: EntrepreneurPulse.com.au  Fact Sheet for Healthcare Providers: IncredibleEmployment.be  This test is not yet approved or cleared by the [place] FDA and has been authorized for detection and/or diagnosis of SARS-CoV-2 by FDA under an Emergency Use Authorization (EUA). This EUA will remain in effect (meaning this test can be used) for the duration of the COVID-19 declaration under Section 564(b)(1) of the Act, 21 U.S.C. section 360bbb-3(b)(1), unless the authorization is terminated or revoked.  Performed at [facility], [address] [place], [place] [address]   Respiratory (~20 pathogens) panel by PCR     Status: None   Collection Time: 12/18/21  6:27 PM   Specimen:  Nasopharyngeal Swab; Respiratory  Result Value Ref Range Status   Adenovirus NOT DETECTED NOT DETECTED Final   Coronavirus 229E NOT DETECTED NOT DETECTED Final    Comment: (NOTE) The Coronavirus on the Respiratory Panel, DOES NOT test for the novel  Coronavirus (2019 nCoV)    Coronavirus HKU1 NOT DETECTED NOT DETECTED Final   Coronavirus NL63 NOT DETECTED NOT DETECTED Final   Coronavirus OC43 NOT DETECTED NOT DETECTED Final   Metapneumovirus NOT DETECTED NOT DETECTED Final   Rhinovirus / Enterovirus NOT DETECTED NOT DETECTED Final   Influenza A NOT DETECTED NOT DETECTED Final   Influenza B NOT DETECTED NOT DETECTED Final   Parainfluenza Virus 1 NOT DETECTED NOT DETECTED Final   Parainfluenza Virus 2 NOT DETECTED NOT DETECTED Final   Parainfluenza Virus 3 NOT DETECTED NOT DETECTED Final   Parainfluenza Virus 4 NOT DETECTED NOT DETECTED Final   Respiratory Syncytial Virus NOT DETECTED NOT DETECTED Final   Bordetella pertussis NOT DETECTED NOT DETECTED Final   Bordetella Parapertussis NOT DETECTED NOT DETECTED Final   Chlamydophila pneumoniae NOT DETECTED NOT DETECTED Final   Mycoplasma pneumoniae NOT DETECTED NOT DETECTED Final    Comment: Performed at [facility], [place]. [address] [place], [place] [address]     Time coordinating discharge: Over 30 minutes  SIGNED:   Wyvonnia Dusky, MD  [facility] 12/21/2021, 1:10 PM Pager   If 7PM-7AM, please contact night-coverage [email or web address]

## 2022-01-25 ENCOUNTER — Other Ambulatory Visit (HOSPITAL_BASED_OUTPATIENT_CLINIC_OR_DEPARTMENT_OTHER): Payer: Self-pay | Admitting: Osteopathic Medicine

## 2022-03-07 ENCOUNTER — Other Ambulatory Visit: Payer: Self-pay

## 2022-03-07 DIAGNOSIS — Z1231 Encounter for screening mammogram for malignant neoplasm of breast: Secondary | ICD-10-CM

## 2022-03-20 DIAGNOSIS — J45901 Unspecified asthma with (acute) exacerbation: Secondary | ICD-10-CM | POA: Diagnosis not present

## 2022-03-27 ENCOUNTER — Inpatient Hospital Stay
Admission: EM | Admit: 2022-03-27 | Discharge: 2022-03-29 | DRG: 177 | Disposition: A | Discharge status: Home / Self Care | Payer: 59 | Attending: Osteopathic Medicine | Admitting: Osteopathic Medicine

## 2022-03-27 ENCOUNTER — Emergency Department: Payer: 59

## 2022-03-27 ENCOUNTER — Other Ambulatory Visit: Payer: Self-pay

## 2022-03-27 ENCOUNTER — Encounter: Payer: Self-pay | Admitting: Emergency Medicine

## 2022-03-27 DIAGNOSIS — J441 Chronic obstructive pulmonary disease with (acute) exacerbation: Principal | ICD-10-CM

## 2022-03-27 DIAGNOSIS — I10 Essential (primary) hypertension: Secondary | ICD-10-CM | POA: Diagnosis present

## 2022-03-27 DIAGNOSIS — U071 COVID-19: Principal | ICD-10-CM | POA: Diagnosis present

## 2022-03-27 DIAGNOSIS — J9601 Acute respiratory failure with hypoxia: Secondary | ICD-10-CM

## 2022-03-27 DIAGNOSIS — G4733 Obstructive sleep apnea (adult) (pediatric): Secondary | ICD-10-CM | POA: Diagnosis present

## 2022-03-27 DIAGNOSIS — Z6838 Body mass index (BMI) 38.0-38.9, adult: Secondary | ICD-10-CM

## 2022-03-27 DIAGNOSIS — R0602 Shortness of breath: Secondary | ICD-10-CM | POA: Diagnosis not present

## 2022-03-27 DIAGNOSIS — I272 Pulmonary hypertension, unspecified: Secondary | ICD-10-CM | POA: Diagnosis present

## 2022-03-27 DIAGNOSIS — Z853 Personal history of malignant neoplasm of breast: Secondary | ICD-10-CM

## 2022-03-27 DIAGNOSIS — J45901 Unspecified asthma with (acute) exacerbation: Secondary | ICD-10-CM | POA: Diagnosis present

## 2022-03-27 DIAGNOSIS — Z923 Personal history of irradiation: Secondary | ICD-10-CM

## 2022-03-27 DIAGNOSIS — E669 Obesity, unspecified: Secondary | ICD-10-CM | POA: Diagnosis present

## 2022-03-27 DIAGNOSIS — T380X5A Adverse effect of glucocorticoids and synthetic analogues, initial encounter: Secondary | ICD-10-CM | POA: Diagnosis present

## 2022-03-27 DIAGNOSIS — E1165 Type 2 diabetes mellitus with hyperglycemia: Secondary | ICD-10-CM | POA: Diagnosis present

## 2022-03-27 DIAGNOSIS — Z87891 Personal history of nicotine dependence: Secondary | ICD-10-CM

## 2022-03-27 HISTORY — DX: COVID-19: U07.1

## 2022-03-27 LAB — FERRITIN: Ferritin: 29 ng/mL (ref 11–307)

## 2022-03-27 LAB — CBC
HCT: 39.1 % (ref 36.0–46.0)
Hemoglobin: 12.4 g/dL (ref 12.0–15.0)
MCH: 28.3 pg (ref 26.0–34.0)
MCHC: 31.7 g/dL (ref 30.0–36.0)
MCV: 89.3 fL (ref 80.0–100.0)
Platelets: 208 10*3/uL (ref 150–400)
RBC: 4.38 MIL/uL (ref 3.87–5.11)
RDW: 13.2 % (ref 11.5–15.5)
WBC: 5.2 10*3/uL (ref 4.0–10.5)
nRBC: 0 % (ref 0.0–0.2)

## 2022-03-27 LAB — GLUCOSE, CAPILLARY
Glucose-Capillary: 222 mg/dL — ABNORMAL HIGH (ref 70–99)
Glucose-Capillary: 152 mg/dL — ABNORMAL HIGH (ref 70–99)

## 2022-03-27 LAB — BASIC METABOLIC PANEL
Anion gap: 9 (ref 5–15)
BUN: 13 mg/dL (ref 8–23)
CO2: 25 mmol/L (ref 22–32)
Calcium: 9 mg/dL (ref 8.9–10.3)
Chloride: 105 mmol/L (ref 98–111)
Creatinine, Ser: 0.91 mg/dL (ref 0.44–1.00)
GFR, Estimated: >60 mL/min (ref >60)
Glucose, Bld: 134 mg/dL — ABNORMAL HIGH (ref 70–99)
Potassium: 3.6 mmol/L (ref 3.5–5.1)
Sodium: 139 mmol/L (ref 135–145)

## 2022-03-27 LAB — HEMOGLOBIN A1C
Hgb A1c MFr Bld: 6 % — ABNORMAL HIGH (ref 4.8–5.6)
Mean Plasma Glucose: 125.5 mg/dL

## 2022-03-27 LAB — RESP PANEL BY RT-PCR (RSV, FLU A&B, COVID)  RVPGX2
Influenza A by PCR: NEGATIVE
Influenza B by PCR: NEGATIVE
Resp Syncytial Virus by PCR: NEGATIVE
SARS Coronavirus 2 by RT PCR: POSITIVE — AB

## 2022-03-27 LAB — TROPONIN I (HIGH SENSITIVITY): Troponin I (High Sensitivity): 5 ng/L (ref <18)

## 2022-03-27 LAB — C-REACTIVE PROTEIN: CRP: 0.6 mg/dL (ref <1.0)

## 2022-03-27 LAB — CBG MONITORING, ED: Glucose-Capillary: 284 mg/dL — ABNORMAL HIGH (ref 70–99)

## 2022-03-27 MED ORDER — GUAIFENESIN-DM 100-10 MG/5ML PO SYRP: 10.0000 mL | ORAL_SOLUTION | ORAL | Status: DC | PRN

## 2022-03-27 MED ORDER — ALBUTEROL SULFATE HFA 108 (90 BASE) MCG/ACT IN AERS
2.0000 | INHALATION_SPRAY | Freq: Four times a day (QID) | RESPIRATORY_TRACT | Status: DC
  Administered 2022-03-27 – 2022-03-29 (×7): 2 via RESPIRATORY_TRACT
  Filled 2022-03-27: qty 6.7

## 2022-03-27 MED ORDER — PANTOPRAZOLE SODIUM 40 MG PO TBEC
40.0000 mg | DELAYED_RELEASE_TABLET | Freq: Every day | ORAL | Status: DC
  Administered 2022-03-28 – 2022-03-29 (×2): 40 mg via ORAL
  Filled 2022-03-27 (×2): qty 1

## 2022-03-27 MED ORDER — HYDRALAZINE HCL 20 MG/ML IJ SOLN: 10.0000 mg | Freq: Four times a day (QID) | INTRAMUSCULAR | Status: DC | PRN

## 2022-03-27 MED ORDER — IPRATROPIUM-ALBUTEROL 0.5-2.5 (3) MG/3ML IN SOLN: 3.0000 mL | Freq: Four times a day (QID) | RESPIRATORY_TRACT | Status: DC | PRN

## 2022-03-27 MED ORDER — IPRATROPIUM-ALBUTEROL 0.5-2.5 (3) MG/3ML IN SOLN
3.0000 mL | RESPIRATORY_TRACT | Status: DC | PRN
  Administered 2022-03-28 (×2): 3 mL via RESPIRATORY_TRACT
  Filled 2022-03-27 (×2): qty 3

## 2022-03-27 MED ORDER — INSULIN ASPART 100 UNIT/ML IJ SOLN
0.0000 [IU] | Freq: Three times a day (TID) | INTRAMUSCULAR | Status: DC
  Administered 2022-03-27: 3 [IU] via SUBCUTANEOUS
  Administered 2022-03-27: 5 [IU] via SUBCUTANEOUS
  Administered 2022-03-28 (×2): 2 [IU] via SUBCUTANEOUS
  Administered 2022-03-28: 5 [IU] via SUBCUTANEOUS
  Filled 2022-03-27 (×5): qty 1

## 2022-03-27 MED ORDER — ENOXAPARIN SODIUM 60 MG/0.6ML IJ SOSY
0.5000 mg/kg | PREFILLED_SYRINGE | INTRAMUSCULAR | Status: DC
  Administered 2022-03-27 – 2022-03-28 (×2): 55 mg via SUBCUTANEOUS
  Filled 2022-03-27 (×2): qty 0.6

## 2022-03-27 MED ORDER — LOSARTAN POTASSIUM-HCTZ 50-12.5 MG PO TABS: 1.0000 | ORAL_TABLET | Freq: Every day | ORAL | Status: DC

## 2022-03-27 MED ORDER — IPRATROPIUM-ALBUTEROL 0.5-2.5 (3) MG/3ML IN SOLN
9.0000 mL | Freq: Once | RESPIRATORY_TRACT | Status: AC
  Administered 2022-03-27: 9 mL via RESPIRATORY_TRACT
  Filled 2022-03-27: qty 6

## 2022-03-27 MED ORDER — HYDROCOD POLI-CHLORPHE POLI ER 10-8 MG/5ML PO SUER: 5.0000 mL | Freq: Two times a day (BID) | ORAL | Status: DC | PRN

## 2022-03-27 MED ORDER — GUAIFENESIN-DM 100-10 MG/5ML PO SYRP
5.0000 mL | ORAL_SOLUTION | Freq: Four times a day (QID) | ORAL | Status: DC | PRN
  Administered 2022-03-27: 5 mL via ORAL
  Filled 2022-03-27: qty 10

## 2022-03-27 MED ORDER — CITALOPRAM HYDROBROMIDE 20 MG PO TABS
40.0000 mg | ORAL_TABLET | Freq: Every day | ORAL | Status: DC
  Administered 2022-03-28 – 2022-03-29 (×2): 40 mg via ORAL
  Filled 2022-03-27 (×2): qty 2

## 2022-03-27 MED ORDER — INSULIN ASPART 100 UNIT/ML IJ SOLN
0.0000 [IU] | Freq: Every day | INTRAMUSCULAR | Status: DC
  Administered 2022-03-28: 3 [IU] via SUBCUTANEOUS
  Filled 2022-03-27: qty 1

## 2022-03-27 MED ORDER — UMECLIDINIUM BROMIDE 62.5 MCG/ACT IN AEPB
1.0000 | INHALATION_SPRAY | Freq: Every day | RESPIRATORY_TRACT | Status: DC
  Administered 2022-03-28 – 2022-03-29 (×2): 1 via RESPIRATORY_TRACT
  Filled 2022-03-27: qty 7

## 2022-03-27 MED ORDER — NIRMATRELVIR/RITONAVIR (PAXLOVID)TABLET
3.0000 | ORAL_TABLET | Freq: Two times a day (BID) | ORAL | Status: DC
  Administered 2022-03-27 – 2022-03-29 (×5): 3 via ORAL
  Filled 2022-03-27: qty 30

## 2022-03-27 MED ORDER — POLYETHYLENE GLYCOL 3350 17 G PO PACK: 17.0000 g | PACK | Freq: Every day | ORAL | Status: DC | PRN

## 2022-03-27 MED ORDER — METHYLPREDNISOLONE SODIUM SUCC 125 MG IJ SOLR
125.0000 mg | Freq: Once | INTRAMUSCULAR | Status: AC
  Administered 2022-03-27: 125 mg via INTRAVENOUS
  Filled 2022-03-27: qty 2

## 2022-03-27 MED ORDER — MONTELUKAST SODIUM 10 MG PO TABS
10.0000 mg | ORAL_TABLET | Freq: Every day | ORAL | Status: DC
  Administered 2022-03-28: 10 mg via ORAL
  Filled 2022-03-27: qty 1

## 2022-03-27 MED ORDER — OXYCODONE HCL 5 MG PO TABS: 5.0000 mg | ORAL_TABLET | ORAL | Status: DC | PRN

## 2022-03-27 MED ORDER — ALBUTEROL SULFATE (2.5 MG/3ML) 0.083% IN NEBU
2.5000 mg | INHALATION_SOLUTION | Freq: Four times a day (QID) | RESPIRATORY_TRACT | Status: DC
  Administered 2022-03-27: 2.5 mg via RESPIRATORY_TRACT
  Filled 2022-03-27: qty 3

## 2022-03-27 MED ORDER — FLUTICASONE FUROATE-VILANTEROL 100-25 MCG/ACT IN AEPB
1.0000 | INHALATION_SPRAY | Freq: Every day | RESPIRATORY_TRACT | Status: DC
  Administered 2022-03-28 – 2022-03-29 (×2): 1 via RESPIRATORY_TRACT
  Filled 2022-03-27: qty 28

## 2022-03-27 MED ORDER — SENNA 8.6 MG PO TABS
1.0000 | ORAL_TABLET | Freq: Two times a day (BID) | ORAL | Status: DC
  Administered 2022-03-27 – 2022-03-28 (×4): 8.6 mg via ORAL
  Filled 2022-03-27 (×5): qty 1

## 2022-03-27 MED ORDER — METHYLPREDNISOLONE SODIUM SUCC 40 MG IJ SOLR
40.0000 mg | Freq: Two times a day (BID) | INTRAMUSCULAR | Status: DC
  Administered 2022-03-27: 40 mg via INTRAVENOUS
  Filled 2022-03-27: qty 1

## 2022-03-27 MED ORDER — HYDROCHLOROTHIAZIDE 12.5 MG PO TABS
12.5000 mg | ORAL_TABLET | Freq: Every day | ORAL | Status: DC
  Administered 2022-03-28 – 2022-03-29 (×2): 12.5 mg via ORAL
  Filled 2022-03-27 (×2): qty 1

## 2022-03-27 MED ORDER — GUAIFENESIN ER 600 MG PO TB12
600.0000 mg | ORAL_TABLET | Freq: Two times a day (BID) | ORAL | Status: DC | PRN
  Administered 2022-03-28: 600 mg via ORAL
  Filled 2022-03-27: qty 1

## 2022-03-27 MED ORDER — LOSARTAN POTASSIUM 50 MG PO TABS
50.0000 mg | ORAL_TABLET | Freq: Every day | ORAL | Status: DC
  Administered 2022-03-28 – 2022-03-29 (×2): 50 mg via ORAL
  Filled 2022-03-27 (×2): qty 1

## 2022-03-27 MED ORDER — ACETAMINOPHEN 325 MG PO TABS: 650.0000 mg | ORAL_TABLET | Freq: Four times a day (QID) | ORAL | Status: DC | PRN

## 2022-03-27 MED ORDER — LETROZOLE 2.5 MG PO TABS: 2.5000 mg | ORAL_TABLET | Freq: Every day | ORAL | Status: DC

## 2022-03-27 MED ORDER — ACETAMINOPHEN 650 MG RE SUPP: 650.0000 mg | Freq: Four times a day (QID) | RECTAL | Status: DC | PRN

## 2022-03-27 NOTE — Progress Notes (Signed)
Prior-To-Admission Oral Chemotherapy for Treatment of Oncologic Disease   Order noted from Dr. Pietro Cassis to continue prior-to-admission oral chemotherapy regimen of letrozole 2.'5mg'$  daily.  Procedure Per Pharmacy & Therapeutics Committee Policy: Orders for continuation of home oral chemotherapy for treatment of an oncologic disease will be held unless approved by an oncologist during current admission.    For patients receiving oncology care at [facility], inpatient pharmacist contacts patient's oncologist during regular office hours to review. If earlier review is medically necessary, attending physician consults [facility] on-call oncologist   For patients receiving oncology care outside of [facility], attending physician consults patient's oncologist to review. If this oncologist or their coverage cannot be reached, attending physician consults [facility] on-call oncologist   Oral chemotherapy continuation order is on hold pending oncologist review, [facility] oncologist Dr. Janese Banks will be notified by inpatient pharmacy during office hours    Lu Duffel, PharmD, MBA

## 2022-03-27 NOTE — ED Notes (Addendum)
Patient placed on 2L [place] at this time due to low O2 on RA. Now 91 % on the 2L [place].

## 2022-03-27 NOTE — ED Notes (Signed)
Pt received lunch tray and no other needs are expressed at this time.

## 2022-03-27 NOTE — ED Provider Notes (Signed)
[facility] Provider Note    Event Date/Time   First MD Initiated Contact with Patient 03/27/22 [phone]     (approximate)   History   Chief Complaint Shortness of Breath   HPI  Nancy Patton is a 62 y.o. female with past medical history of hypertension, diabetes, COPD, asthma, pulmonary hypertension, and breast cancer who presents to the ED complaining of shortness of breath.  Patient reports that she has been dealing with 1 week of gradually worsening difficulty breathing, particularly with any exertion.  This been associated with a cough productive of whitish sputum along with a stuffy and runny nose.  She denies any fevers and has not had any pain in her chest, denies pain or swelling in her legs.  She describes symptoms as similar to prior asthma exacerbations, denies cigarette smoking.     Physical Exam   Triage Vital Signs: ED Triage Vitals  Enc Vitals Group     BP 03/27/22 0845 126/80     Pulse Rate 03/27/22 0845 90     Resp 03/27/22 0845 18     Temp 03/27/22 0845 99.1 F (37.3 C)     Temp Source 03/27/22 0845 Oral     SpO2 03/27/22 0845 (!) 86 %     Weight --      Height --      Head Circumference --      Peak Flow --      Pain Score 03/27/22 0849 0     Pain Loc --      Pain Edu? --      Excl. in [place]? --     Most recent vital signs: Vitals:   03/27/22 1030 03/27/22 1130  BP: 120/71 120/73  Pulse: 86 88  Resp:  12  Temp:    SpO2:  93%    Constitutional: Alert and oriented. Eyes: Conjunctivae are normal. Head: Atraumatic. Nose: No congestion/rhinnorhea. Mouth/Throat: Mucous membranes are moist.  Cardiovascular: Normal rate, regular rhythm. Grossly normal heart sounds.  2+ radial pulses bilaterally. Respiratory: Normal respiratory effort.  No retractions. Lungs with inspiratory and expiratory wheezing throughout. Gastrointestinal: Soft and nontender. No distention. Musculoskeletal: No lower extremity tenderness nor edema.   Neurologic:  Normal speech and language. No gross focal neurologic deficits are appreciated.    ED Results / Procedures / Treatments   Labs (all labs ordered are listed, but only abnormal results are displayed) Labs Reviewed  RESP PANEL BY RT-PCR (RSV, FLU A&B, COVID)  [id number] - Abnormal; Notable for the following components:      Result Value   SARS Coronavirus 2 by RT PCR POSITIVE (*)    All other components within normal limits  BASIC METABOLIC PANEL - Abnormal; Notable for the following components:   Glucose, Bld 134 (*)    All other components within normal limits  CBC  TROPONIN I (HIGH SENSITIVITY)     EKG  ED ECG REPORT I, Blake Divine, the attending physician, personally viewed and interpreted this ECG.   Date: 03/27/2022  EKG Time: 8:50  Rate: 91  Rhythm: normal sinus rhythm  Axis: Normal  Intervals:none  ST&T Change: None  RADIOLOGY Chest x-ray reviewed and interpreted by me with no infiltrate, edema, or effusion.  PROCEDURES:  Critical Care performed: Yes, see critical care procedure note(s)  .Critical Care  Performed by: Blake Divine, MD Authorized by: Blake Divine, MD   Critical care provider statement:    Critical care time (minutes):  30  Critical care time was exclusive of:  Separately billable procedures and treating other patients and teaching time   Critical care was necessary to treat or prevent imminent or life-threatening deterioration of the following conditions:  Respiratory failure   Critical care was time spent personally by me on the following activities:  Development of treatment plan with patient or surrogate, discussions with consultants, evaluation of patient's response to treatment, examination of patient, ordering and review of laboratory studies, ordering and review of radiographic studies, ordering and performing treatments and interventions, pulse oximetry, re-evaluation of patient's condition and review of old charts   I  assumed direction of critical care for this patient from another provider in my specialty: no     Care discussed with: admitting provider      MEDICATIONS ORDERED IN ED: Medications  ipratropium-albuterol (DUONEB) 0.5-2.5 (3) MG/3ML nebulizer solution 9 mL (9 mLs Nebulization Given 03/27/22 0919)  methylPREDNISolone sodium succinate (SOLU-MEDROL) 125 mg/2 mL injection 125 mg (125 mg Intravenous Given 03/27/22 0920)     IMPRESSION / MDM / [place] / ED COURSE  I reviewed the triage vital signs and the nursing notes.                              62 y.o. female with past medical history of hypertension, diabetes, COPD, asthma, pulmonary hypertension, and breast cancer who presents to the ED with productive cough and increasing difficulty breathing over the past week.  Patient's presentation is most consistent with acute presentation with potential threat to life or bodily function.  Differential diagnosis includes, but is not limited to, COPD exacerbation, asthma, pneumonia, CHF, ACS, PE, influenza, COVID-19.  Patient nontoxic-appearing and in no acute distress, noted to have oxygen saturations of 86% on room air in triage, remained hypoxic with 2 L nasal cannula and was increased to 4 L with improvement.  She is not in any respiratory distress but does have significant inspiratory and expiratory wheezing, will treat with DuoNebs and dose of IV Solu-Medrol.  EKG shows no evidence of arrhythmia or ischemia and I doubt ACS or PE.  We will further assess with chest x-ray and labs, also test for COVID-19 and influenza.  Chest x-ray is unremarkable, labs are reassuring with no significant anemia, leukocytosis, electrolyte abnormality, or AKI.  Troponin within normal limits and I doubt ACS or PE.  Patient did test positive for COVID-19, which is likely contributing to her COPD exacerbation.  Work of breathing and wheezing are improved on reassessment but patient continues to require  supplemental oxygen.  Case discussed with hospitalist for admission.      FINAL CLINICAL IMPRESSION(S) / ED DIAGNOSES   Final diagnoses:  COPD exacerbation ([place])  COVID-19  Acute respiratory failure with hypoxia ([place])     Rx / DC Orders   ED Discharge Orders     None        Note:  This document was prepared using Dragon voice recognition software and may include unintentional dictation errors.   Blake Divine, MD 03/27/22 [phone]

## 2022-03-27 NOTE — ED Notes (Addendum)
Pt resting and comfortable.

## 2022-03-27 NOTE — ED Triage Notes (Signed)
Patient to ED for SOB x1 week. Hx of Asthma. Productive cough with white colored sputum. 85% RA placed on 2L in triage.

## 2022-03-27 NOTE — ED Notes (Signed)
Patient denies pain and is resting comfortably.  

## 2022-03-27 NOTE — H&P (Signed)
[facility] History and Physical  Nancy Patton ZOX:[id number] DOB: 1960-06-01 DOA: 03/27/2022 PCP: Perrin Maltese, MD  Admitted from: Home Chief Complaint: Shortness of breath  History of Present Illness: Nancy Patton is a 62 y.o. female with PMH significant for obesity, OSA, DM2, HTN, asthma, pulmonary hypertension, breast cancer s/p lumpectomy, radiation. Patient presented to the ED this morning with complaint of shortness of breath progressively worsening for 1 week associated with cough, white sputum and runny nose.  Denies any fever, chest pain, pedal edema.  Used to smoke in the past, quit 30 years ago. Lives at home with her daughter and granddaughters.  Able to ambulate independently.  In the ED, patient was afebrile, heart rate in 80s, blood pressure in 120s, O2 sat low at 86% requiring 2-3 L oxygen by nasal cannula. Respiratory positive for COVID.  Labs otherwise unremarkable Chest x-ray unremarkable for pneumonia. In the ED, patient was given IV steroids Hospitalist service consulted for inpatient admission and management.  At the time of my evaluation, patient was propped up in bed.  On 2 L oxygen by nasal cannula.  Feels better than at presentation.  Minimal cough.  Dyspnea on exertion is the primary symptom.  Review of Systems:  All systems were reviewed and were negative unless otherwise mentioned in the HPI   Past medical history: Past Medical History:  Diagnosis Date   Arthritis    Asthma    Breast cancer ([place])    Diabetes mellitus without complication ([place])    Hypertension    Obesity    Personal history of radiation therapy    Pulmonary hypertension ([place])    Sleep apnea     Past surgical history: Past Surgical History:  Procedure Laterality Date   BREAST LUMPECTOMY Right 05/23/2019   invasive DCIS radation    CATARACT EXTRACTION     CESAREAN SECTION     x 3     Social History:  reports that she has quit smoking. Her smoking  use included cigarettes. She has never used smokeless tobacco. She reports that she does not drink alcohol and does not use drugs.  Allergies:  No Known Allergies Patient has no known allergies.   Family history:  Family History  Problem Relation Age of Onset   Diabetes Mother    Stroke Mother    Diabetes Father    Diabetes Brother    Cancer Neg Hx    Heart failure Neg Hx    Hypertension Neg Hx    Breast cancer Neg Hx      Home Meds: Prior to Admission medications   Medication Sig Start Date End Date Taking? Authorizing Provider  albuterol (VENTOLIN HFA) 108 (90 Base) MCG/ACT inhaler Inhale 2 puffs into the lungs every 6 (six) hours as needed for wheezing or shortness of breath. 10/16/21   Emeterio Reeve, DO  citalopram (CELEXA) 40 MG tablet Take 40 mg by mouth daily.     [provider]  guaiFENesin (MUCINEX) 600 MG 12 hr tablet Take 1-2 tablets (600-1,200 mg total) by mouth 2 (two) times daily as needed. 10/16/21   Emeterio Reeve, DO  ipratropium-albuterol (DUONEB) 0.5-2.5 (3) MG/3ML SOLN Take 3 mLs by nebulization every 4 (four) hours as needed (shortness of breath / wheeze). 10/16/21   Emeterio Reeve, DO  letrozole (FEMARA) 2.5 MG tablet TAKE 1 TABLET BY MOUTH EVERY DAY (DON'T START MED UNTIL YOU COMPLETE RADIATION 1ST) 04/25/21   Sindy Guadeloupe, MD  losartan-hydrochlorothiazide (HYZAAR) 50-12.5 MG per  tablet Take 1 tablet by mouth daily.    [provider]  montelukast (SINGULAIR) 10 MG tablet Take 1 tablet (10 mg total) by mouth every morning. 10/16/21   Emeterio Reeve, DO  pantoprazole (PROTONIX) 40 MG tablet Take 1 tablet (40 mg total) by mouth daily. 10/16/21   Emeterio Reeve, DO  predniSONE (DELTASONE) 10 MG tablet Prednisone '40mg'$  daily x 4 days, '30mg'$  daily x 4 days, '20mg'$  daily x 4 days, '10mg'$  daily x 4 days then stop 12/21/21   Wyvonnia Dusky, MD  tirzepatide [facility]) 5 MG/0.5ML Pen Inject 5 mg into the skin every Saturday.    [provider]  Donnal Debar 100-62.5-25 MCG/ACT AEPB Take 1 puff by mouth daily. 10/16/21   Emeterio Reeve, DO    Physical Exam: Vitals:   03/27/22 0930 03/27/22 1000 03/27/22 1030 03/27/22 1130  BP: 122/65 113/76 120/71 120/73  Pulse: 84 87 86 88  Resp:    12  Temp:      TempSrc:      SpO2: 92% 90%  93%   Wt Readings from Last 3 Encounters:  12/18/21 106.6 kg  10/16/21 106.5 kg  08/29/21 108.9 kg   There is no height or weight on file to calculate BMI.  General exam: Pleasant, obese middle-aged female.  Not in pain Skin: No rashes, lesions or ulcers. HEENT: Atraumatic, normocephalic, no obvious bleeding Lungs: Mild scattered wheezing bilaterally.  No crackles. CVS: Regular rate and rhythm, no murmur GI/Abd soft, nontender, nondistended, bowels are present CNS: Alert, awake, oriented x 3 Psychiatry: Mood appropriate Extremities: No pedal edema, no calf tenderness   ------------------------------------------------------------------------------------------------------ Assessment/Plan: Principal Problem:   COVID-19 virus infection  COVID infection Acute respiratory failure with hypoxia  Presented with shortness of breath, cough COVID test: PCR positive in ED Chest imaging: Chest x-ray without infiltrates Treatment: Given IV steroids in the ED. Start Paxlovid 5-day course. Because of hypoxia, continue steroids, plan to taper after clinical improvement. Not on supplemental oxygen at home.  Currently requiring up to 3 L/min and elevated wean down as tolerated. Start supportive care with as needed antitussives, as needed bronchodilators, incentive spirometry, encourage ambulation. Anti-inflammatory indicators to initial lab.  Trend. Recent Labs  Lab 03/27/22 0853 03/27/22 0921  SARSCOV2NAA  --  POSITIVE*  WBC 5.2  --    History of asthma History of smoking Patient stopped smoking more than 30 years ago.  Denies history of COPD. PTA on Trelegy daily, Singulair,  as needed DuoNeb, as needed Mucinex not on scheduled prednisone.  Essential hypertension PTA on losartan 50 mg daily, HCTZ 12.5 mg daily. Continue monitor blood pressure  Type 2 diabetes mellitus A1c 5.6 in August 2023 PTA on [facility]: [place] Rhonda W every Saturday Start on sliding-scale insulin with Accu-Cheks.  Blood sugar level expected rise because of use of IV steroids. Lab Results  Component Value Date   HGBA1C 5.6 10/14/2021   No results for input(s): "GLUCAP" in the last 168 hours.  Obesity  There is no height or weight on file to calculate BMI. Patient has been advised to make an attempt to improve diet and exercise patterns to aid in weight loss.  Breast cancer  s/p lumpectomy, radiation. Continue letrozole.   Mobility: Ambulates independently at home  Goals of care   Code Status: Full Code    DVT prophylaxis: Lovenox subcu   Antimicrobials: Antiviral for COVID Fluid: None Consultants: None Family Communication: None  Dispo: The patient is from: Home  Anticipated d/c is to: Home in 1 to 2 days  Diet: Diet Order     None        ------------------------------------------------------------------------------------- Severity of Illness: The appropriate patient status for this patient is OBSERVATION. Observation status is judged to be reasonable and necessary in order to provide the required intensity of service to ensure the patient's safety. The patient's presenting symptoms, physical exam findings, and initial radiographic and laboratory data in the context of their medical condition is felt to place them at decreased risk for further clinical deterioration. Furthermore, it is anticipated that the patient will be medically stable for discharge from the hospital within 2 midnights of admission.  -------------------------------------------------------------------------------------  Labs on Admission:   CBC: Recent Labs  Lab 03/27/22 0853  WBC 5.2  HGB 12.4   HCT 39.1  MCV 89.3  PLT 644    Basic Metabolic Panel: Recent Labs  Lab 03/27/22 0853  NA 139  K 3.6  CL 105  CO2 25  GLUCOSE 134*  BUN 13  CREATININE 0.91  CALCIUM 9.0    Liver Function Tests: No results for input(s): "AST", "ALT", "ALKPHOS", "BILITOT", "PROT", "ALBUMIN" in the last 168 hours. No results for input(s): "LIPASE", "AMYLASE" in the last 168 hours. No results for input(s): "AMMONIA" in the last 168 hours.  Cardiac Enzymes: No results for input(s): "CKTOTAL", "CKMB", "CKMBINDEX", "TROPONINI" in the last 168 hours.  BNP (last 3 results) Recent Labs    08/29/21 0703 10/13/21 1542 12/18/21 1437  BNP 18.6 9.4 32.9    ProBNP (last 3 results) No results for input(s): "PROBNP" in the last 8760 hours.  CBG: No results for input(s): "GLUCAP" in the last 168 hours.  Lipase     Component Value Date/Time   LIPASE 36 07/05/2021 2031     Urinalysis    Component Value Date/Time   COLORURINE YELLOW (A) 07/06/2021 0352   APPEARANCEUR HAZY (A) 07/06/2021 0352   LABSPEC 1.031 (H) 07/06/2021 0352   PHURINE 5.0 07/06/2021 0352   GLUCOSEU NEGATIVE 07/06/2021 0352   HGBUR NEGATIVE 07/06/2021 0352   BILIRUBINUR NEGATIVE 07/06/2021 0352   KETONESUR 20 (A) 07/06/2021 0352   PROTEINUR NEGATIVE 07/06/2021 0352   NITRITE NEGATIVE 07/06/2021 0352   LEUKOCYTESUR MODERATE (A) 07/06/2021 0352     Drugs of Abuse  No results found for: "LABOPIA", "COCAINSCRNUR", "LABBENZ", "AMPHETMU", "THCU", "LABBARB"    Radiological Exams on Admission: DG Chest 2 View  Result Date: 03/27/2022 CLINICAL DATA:  Shortness of breath for 1 week EXAM: CHEST - 2 VIEW COMPARISON:  12/18/2021 FINDINGS: Normal heart size, mediastinal contours, and pulmonary vascularity. Minimal scarring RIGHT middle lobe. Lungs otherwise clear. No pulmonary infiltrate, pleural effusion, or pneumothorax. Osseous structures unremarkable. IMPRESSION: No acute abnormalities. Electronically Signed   By: Lavonia Dana  M.D.   On: 03/27/2022 09:25     Signed, Terrilee Croak, MD [facility] 03/27/2022

## 2022-03-28 DIAGNOSIS — E669 Obesity, unspecified: Secondary | ICD-10-CM | POA: Diagnosis not present

## 2022-03-28 DIAGNOSIS — E1165 Type 2 diabetes mellitus with hyperglycemia: Secondary | ICD-10-CM | POA: Diagnosis not present

## 2022-03-28 DIAGNOSIS — Z853 Personal history of malignant neoplasm of breast: Secondary | ICD-10-CM | POA: Diagnosis not present

## 2022-03-28 DIAGNOSIS — Z923 Personal history of irradiation: Secondary | ICD-10-CM | POA: Diagnosis not present

## 2022-03-28 DIAGNOSIS — Z6838 Body mass index (BMI) 38.0-38.9, adult: Secondary | ICD-10-CM | POA: Diagnosis not present

## 2022-03-28 DIAGNOSIS — I10 Essential (primary) hypertension: Secondary | ICD-10-CM | POA: Diagnosis not present

## 2022-03-28 DIAGNOSIS — J45901 Unspecified asthma with (acute) exacerbation: Secondary | ICD-10-CM | POA: Diagnosis not present

## 2022-03-28 DIAGNOSIS — J9601 Acute respiratory failure with hypoxia: Secondary | ICD-10-CM | POA: Diagnosis not present

## 2022-03-28 DIAGNOSIS — I272 Pulmonary hypertension, unspecified: Secondary | ICD-10-CM | POA: Diagnosis not present

## 2022-03-28 DIAGNOSIS — Z87891 Personal history of nicotine dependence: Secondary | ICD-10-CM | POA: Diagnosis not present

## 2022-03-28 DIAGNOSIS — J441 Chronic obstructive pulmonary disease with (acute) exacerbation: Secondary | ICD-10-CM | POA: Diagnosis not present

## 2022-03-28 DIAGNOSIS — T380X5A Adverse effect of glucocorticoids and synthetic analogues, initial encounter: Secondary | ICD-10-CM | POA: Diagnosis not present

## 2022-03-28 DIAGNOSIS — U071 COVID-19: Secondary | ICD-10-CM | POA: Diagnosis not present

## 2022-03-28 DIAGNOSIS — G4733 Obstructive sleep apnea (adult) (pediatric): Secondary | ICD-10-CM | POA: Diagnosis not present

## 2022-03-28 LAB — BASIC METABOLIC PANEL
Anion gap: 10 (ref 5–15)
BUN: 17 mg/dL (ref 8–23)
CO2: 26 mmol/L (ref 22–32)
Calcium: 9.1 mg/dL (ref 8.9–10.3)
Chloride: 103 mmol/L (ref 98–111)
Creatinine, Ser: 0.77 mg/dL (ref 0.44–1.00)
GFR, Estimated: >60 mL/min (ref >60)
Glucose, Bld: 234 mg/dL — ABNORMAL HIGH (ref 70–99)
Potassium: 4.3 mmol/L (ref 3.5–5.1)
Sodium: 139 mmol/L (ref 135–145)

## 2022-03-28 LAB — CBC
HCT: 39.3 % (ref 36.0–46.0)
Hemoglobin: 12.8 g/dL (ref 12.0–15.0)
MCH: 28.6 pg (ref 26.0–34.0)
MCHC: 32.6 g/dL (ref 30.0–36.0)
MCV: 87.7 fL (ref 80.0–100.0)
Platelets: 204 10*3/uL (ref 150–400)
RBC: 4.48 MIL/uL (ref 3.87–5.11)
RDW: 12.9 % (ref 11.5–15.5)
WBC: 5 10*3/uL (ref 4.0–10.5)
nRBC: 0 % (ref 0.0–0.2)

## 2022-03-28 LAB — GLUCOSE, CAPILLARY
Glucose-Capillary: 193 mg/dL — ABNORMAL HIGH (ref 70–99)
Glucose-Capillary: 185 mg/dL — ABNORMAL HIGH (ref 70–99)
Glucose-Capillary: 276 mg/dL — ABNORMAL HIGH (ref 70–99)
Glucose-Capillary: 265 mg/dL — ABNORMAL HIGH (ref 70–99)

## 2022-03-28 LAB — FERRITIN: Ferritin: 28 ng/mL (ref 11–307)

## 2022-03-28 LAB — C-REACTIVE PROTEIN: CRP: 0.5 mg/dL (ref <1.0)

## 2022-03-28 MED ORDER — INSULIN ASPART 100 UNIT/ML IJ SOLN
0.0000 [IU] | Freq: Three times a day (TID) | INTRAMUSCULAR | Status: DC
  Administered 2022-03-29: 4 [IU] via SUBCUTANEOUS
  Filled 2022-03-28: qty 1

## 2022-03-28 MED ORDER — DEXAMETHASONE 4 MG PO TABS
6.0000 mg | ORAL_TABLET | Freq: Every day | ORAL | Status: DC
  Administered 2022-03-28 – 2022-03-29 (×2): 6 mg via ORAL
  Filled 2022-03-28 (×2): qty 2

## 2022-03-28 NOTE — Inpatient Diabetes Management (Signed)
Inpatient Diabetes Program Recommendations  AACE/ADA: New Consensus Statement on Inpatient Glycemic Control   Target Ranges:  Prepandial:   less than 140 mg/dL      Peak postprandial:   less than 180 mg/dL (1-2 hours)      Critically ill patients:  140 - 180 mg/dL    Latest Reference Range & Units 03/27/22 15:12 03/27/22 16:42 03/27/22 22:29 03/28/22 08:15  Glucose-Capillary 70 - 99 mg/dL 284 (H) 222 (H) 152 (H) 193 (H)    Latest Reference Range & Units 03/27/22 08:53  Hemoglobin A1C 4.8 - 5.6 % 6.0 (H)   Review of Glycemic Control  Diabetes history: DM2 Outpatient Diabetes medications: Mounjaro 5 mg Qweek (Sunday) Current orders for Inpatient glycemic control: Novolog 0-9 units TID with meals, Novolog 0-5 units QHS; Decadron 6 mg QHS  Inpatient Diabetes Program Recommendations:    Insulin: If steroids are continued, please consider ordering Novolog 3 units TID with meals for meal coverage if patient eats at least 50% of meals.  Thanks, Barnie Alderman, RN, MSN, [place] Diabetes Coordinator Inpatient Diabetes Program [phone] (Team Pager from 8am to [place])

## 2022-03-28 NOTE — Progress Notes (Signed)
  PROGRESS NOTE    Nancy Patton  OJJ:[id number] DOB: 09/01/1960 DOA: 03/27/2022 PCP: Perrin Maltese, MD  118A/[id number]-AA  LOS: 0 days   Brief hospital course:   Assessment & Plan: Nancy Patton is a 62 y.o. female with PMH significant for obesity, OSA, DM2, HTN, asthma, pulmonary hypertension, breast cancer s/p lumpectomy, radiation. Patient presented to the ED this morning with complaint of shortness of breath progressively worsening for 1 week associated with cough, white sputum and runny nose.   In the ED, O2 sat low at 86% requiring 2-3 L oxygen by nasal cannula. Respiratory positive for COVID.   COVID infection Acute respiratory failure with hypoxia  Presented with shortness of breath, cough COVID test: PCR positive in ED Chest imaging: Chest x-ray without infiltrates.  CRP <1. --started on IV solumedrol and Paxlovid on presentation. Plan: --switch steroid to oral decadron 6 mg daily --cont Paxlovid --Continue supplemental O2 to keep sats >=90%, wean as tolerated   Asthma exacerbation --pt reported hx of uncontrolled asthma and frequent exacerbation.  Had dyspnea and wheezing on presentation. --cont steroid as oral decadron 6 mg daily --cont daily bronchodilators as Breo and Incruse --albuterol inhaler q6h scheduled  History of smoking Patient stopped smoking more than 30 years ago.  Denies history of COPD.  Essential hypertension --cont home losartan and HCTZ   Type 2 diabetes mellitus, well controlled Hyperglycemia exacerbated by steroid use --A1c 6.0.  PTA on Mounjaro every Saturday. --SSI increased to resistant scale  Obesity, BMI 38   Hx of Breast cancer  s/p lumpectomy, radiation. --home letrozole held on admission    DVT prophylaxis: Lovenox SQ Code Status: Full code  Family Communication:  Level of care: Med-Surg Dispo:   The patient is from: home Anticipated d/c is to: home Anticipated d/c date is: likely tomorrow, if pt is weaned  down to room air.   Subjective and Interval History:  Pt reported breathing improved.   Objective: Vitals:   03/27/22 2225 03/28/22 0625 03/28/22 0813 03/28/22 1536  BP: (!) 97/59 119/70 116/67 107/64  Pulse: 82 84 81 78  Resp: '19 18 20 20  '$ Temp: 99 F (37.2 C) 98.6 F (37 C) 98.8 F (37.1 C) 98.8 F (37.1 C)  TempSrc:      SpO2: 94% 94% 94% 93%  Weight:      Height:       No intake or output data in the 24 hours ending 03/28/22 2121 Filed Weights   03/27/22 1200  Weight: 109.2 kg    Examination:   Constitutional: NAD, AAOx3 HEENT: conjunctivae and lids normal, EOMI CV: No cyanosis.   RESP: normal respiratory effort, loud high-pitched wheezing both inspiratory and expiratory Neuro: II - XII grossly intact.   Psych: Normal mood and affect.  Appropriate judgement and reason   Data Reviewed: I have personally reviewed labs and imaging studies  Time spent: 50 minutes  Enzo Bi, MD [facility] If 7PM-7AM, please contact night-coverage 03/28/2022, 9:21 PM

## 2022-03-29 DIAGNOSIS — U071 COVID-19: Secondary | ICD-10-CM | POA: Diagnosis not present

## 2022-03-29 LAB — BASIC METABOLIC PANEL
Anion gap: 9 (ref 5–15)
BUN: 21 mg/dL (ref 8–23)
CO2: 26 mmol/L (ref 22–32)
Calcium: 9.1 mg/dL (ref 8.9–10.3)
Chloride: 102 mmol/L (ref 98–111)
Creatinine, Ser: 0.76 mg/dL (ref 0.44–1.00)
GFR, Estimated: >60 mL/min (ref >60)
Glucose, Bld: 178 mg/dL — ABNORMAL HIGH (ref 70–99)
Potassium: 3.8 mmol/L (ref 3.5–5.1)
Sodium: 137 mmol/L (ref 135–145)

## 2022-03-29 LAB — CBC
HCT: 38.7 % (ref 36.0–46.0)
Hemoglobin: 12.7 g/dL (ref 12.0–15.0)
MCH: 28.9 pg (ref 26.0–34.0)
MCHC: 32.8 g/dL (ref 30.0–36.0)
MCV: 88 fL (ref 80.0–100.0)
Platelets: 230 10*3/uL (ref 150–400)
RBC: 4.4 MIL/uL (ref 3.87–5.11)
RDW: 13.1 % (ref 11.5–15.5)
WBC: 9.9 10*3/uL (ref 4.0–10.5)
nRBC: 0 % (ref 0.0–0.2)

## 2022-03-29 LAB — GLUCOSE, CAPILLARY: Glucose-Capillary: 153 mg/dL — ABNORMAL HIGH (ref 70–99)

## 2022-03-29 LAB — MAGNESIUM: Magnesium: 2 mg/dL (ref 1.7–2.4)

## 2022-03-29 MED ORDER — NIRMATRELVIR/RITONAVIR (PAXLOVID)TABLET: 3.0000 | ORAL_TABLET | Freq: Two times a day (BID) | ORAL | 0 refills | Status: AC

## 2022-03-29 MED ORDER — ACETAMINOPHEN 325 MG PO TABS: 650.0000 mg | ORAL_TABLET | Freq: Four times a day (QID) | ORAL | Status: DC | PRN

## 2022-03-29 MED ORDER — DEXAMETHASONE 6 MG PO TABS: 6.0000 mg | ORAL_TABLET | Freq: Every day | ORAL | 0 refills | Status: AC

## 2022-03-29 NOTE — Hospital Course (Signed)
Nancy Patton is a 62 y.o. female with PMH significant for obesity, OSA, DM2, HTN, asthma, pulmonary hypertension, breast cancer s/p lumpectomy, radiation. Patient presented to the ED 03/27/22 with complaint of shortness of breath progressively worsening for 1 week associated with cough, white sputum and runny nose. Hypoxia requiring supplemental O2. (+)COVID. Weaned to RA as of today 03/29/22   Consultants:  ***  Procedures: ***      ASSESSMENT & PLAN:   Principal Problem:   COVID-19 virus infection   COVID infection Acute respiratory failure with hypoxia  Complete course steroid w/ oral decadron 6 mg daily Complete course Paxlovid Continue supplemental O2 to keep sats >=90%, wean as tolerated   Asthma exacerbation Steroids as above cont daily bronchodilators as Breo and Incruse albuterol inhaler q6h scheduled   History of smoking Patient stopped smoking more than 30 years ago.  Denies history of COPD.   Essential hypertension cont home losartan and HCTZ   Type 2 diabetes mellitus, well controlled Hyperglycemia exacerbated by steroid use --A1c 6.0.  PTA on Mounjaro every Saturday. --SSI increased to resistant scale   Obesity, BMI 38   Hx of Breast cancer  s/p lumpectomy, radiation. home letrozole held on admission      DVT prophylaxis: *** Pertinent IV fluids/nutrition: *** Central lines / invasive devices: ***  Code Status: ***  Current Admission Status: ***  TOC needs / Dispo plan: *** Barriers to discharge / significant pending items: ***

## 2022-03-29 NOTE — TOC Transition Note (Signed)
Transition of Care [facility]) - CM/SW Discharge Note   Patient Details  Name: Nancy Patton MRN: [id number] Date of Birth: 09/06/1960  Transition of Care [facility]) CM/SW Contact:  Gerilyn Pilgrim, LCSW Phone Number: 03/29/2022, 10:36 AM   Clinical Narrative:   Readmission screen complete. Pt has orders to discharge and reports no additional concerns, CSW signing off.     Final next level of care: Home/Self Care Barriers to Discharge: Continued Medical Work up   Patient Goals and CMS Choice      Discharge Placement                         Discharge Plan and Services Additional resources added to the After Visit Summary for                                       Social Determinants of Health (SDOH) Interventions SDOH Screenings   Food Insecurity: No Food Insecurity (03/27/2022)  Housing: Low Risk  (03/27/2022)  Transportation Needs: No Transportation Needs (03/27/2022)  Utilities: Not At Risk (03/27/2022)  Tobacco Use: Medium Risk (03/27/2022)     Readmission Risk Interventions    03/29/2022   10:30 AM 12/21/2021   10:31 AM  Readmission Risk Prevention Plan  Transportation Screening Complete Complete  PCP or Specialist Appt within 3-5 Days Complete   HRI or Home Care Consult Complete Not Complete  HRI or Home Care Consult comments  Not indicated  Social Work Consult for [place] Planning/Counseling  Complete  Palliative Care Screening  Not Applicable  Medication Review Press photographer) Complete Complete

## 2022-03-29 NOTE — TOC Initial Note (Signed)
Transition of Care [facility]) - Initial/Assessment Note    Patient Details  Name: Nancy Patton MRN: [id number] Date of Birth: [birth year]  Transition [facility]) CM/SW Contact:    Gerilyn Pilgrim, LCSW Phone Number: 03/29/2022, 10:32 AM  Clinical Narrative:  Pt has orders in to discharge. Pt currently on RA. Pt is seeing Dr Lamonte Sakai and has [employer] health insurance. Pt walks without assistance and lives with her daughters. Pt states her daughter takes her to appointments but her son will be retrieving her from the hospital. Pt reports she would like medications to go to [place] on [address].   Expected Discharge Plan: Home/Self Care Barriers to Discharge: Continued Medical Work up   Patient Goals and CMS Choice Patient states their goals for this hospitalization and ongoing recovery are:: return home          Expected Discharge Plan and Services         Expected Discharge Date: 03/29/22                                    Prior Living Arrangements/Services     Patient language and need for interpreter reviewed:: Yes Do you feel safe going back to the place where you live?: Yes               Activities of Daily Living Home Assistive Devices/Equipment: Eyeglasses ADL Screening (condition at time of admission) Patient's cognitive ability adequate to safely complete daily activities?: Yes Is the patient deaf or have difficulty hearing?: No Does the patient have difficulty seeing, even when wearing glasses/contacts?: No Does the patient have difficulty concentrating, remembering, or making decisions?: No Patient able to express need for assistance with ADLs?: Yes Does the patient have difficulty dressing or bathing?: No Independently performs ADLs?: Yes (appropriate for developmental age) Does the patient have difficulty walking or climbing stairs?: No Weakness of Legs: None Weakness of Arms/Hands: None  Permission Sought/Granted                   Emotional Assessment       Orientation: : Oriented to Self, Oriented to Place, Oriented to  Time, Oriented to Situation      Admission diagnosis:  COPD exacerbation ([place]) [J44.1] Acute respiratory failure with hypoxia ([place]) [J96.01] COVID-19 virus infection [U07.1] COVID-19 [U07.1] Patient Active Problem List   Diagnosis Date Noted   COVID-19 virus infection 03/27/2022   Abnormal CT scan, chest 12/19/2021   Acute asthma exacerbation 12/18/2021   GERD (gastroesophageal reflux disease) 10/14/2021   Pulmonary hypertension ([place]) 10/14/2021   Obesity 10/14/2021   Hypokalemia 10/14/2021   Asthma exacerbation 10/13/2021   Acute respiratory failure with hypoxia ([place]) 07/06/2021   CAP (community acquired pneumonia) 07/06/2021   Sepsis ([place]) 07/05/2021   Multifocal pneumonia 05/13/2021   History of breast cancer 11/06/2020   Status post breast lumpectomy 05/29/2019   Cancer of right breast, stage 1, estrogen receptor positive ([place]) 05/15/2019   COPD exacerbation ([place]) 08/24/2015   HTN (hypertension) 07/10/2015   Type 2 diabetes mellitus ([place]) 07/10/2015   Depression 07/10/2015   Acute respiratory distress 09/26/2014   Asthmatic bronchitis with acute exacerbation 09/26/2014   Asthmatic bronchitis with exacerbation 09/26/2014   PCP:  Perrin Maltese, MD Pharmacy:   [id number]  [id number]: [phone]: [phone]  Social Determinants of Health (SDOH) Social History: SDOH Screenings   Food Insecurity: No Food Insecurity (03/27/2022)  Housing: Low Risk  (03/27/2022)  Transportation Needs: No Transportation Needs (03/27/2022)  Utilities: Not At Risk (03/27/2022)  Tobacco Use: Medium Risk (03/27/2022)   SDOH Interventions:     Readmission Risk Interventions    03/29/2022   10:30 AM 12/21/2021   10:31 AM  Readmission Risk Prevention Plan  Transportation Screening Complete  Complete  PCP or Specialist Appt within 3-5 Days Complete   HRI or Home Care Consult Complete Not Complete  HRI or Home Care Consult comments  Not indicated  Social Work Consult for [place] Planning/Counseling  Complete  Palliative Care Screening  Not Applicable  Medication Review Press photographer) Complete Complete

## 2022-03-29 NOTE — Discharge Summary (Signed)
Physician Discharge Summary   Patient: Nancy Patton MRN: [id number]  DOB: [birth year]   Admit:     Date of Admission: 03/27/2022 Admitted from: home   Discharge: Date of discharge: 03/29/22 Disposition: Home Condition at discharge: good  CODE STATUS: FULL CODE     Discharge Physician: Nancy Reeve, DO [facility]     PCP: Nancy Maltese, MD  Recommendations for Outpatient Follow-up:  Follow up with PCP Nancy Maltese, MD in 2-4 weeks Please obtain labs/tests: CBC, BMP in 2-4 weeks Please follow up on the following pending results: none PCP AND OTHER OUTPATIENT PROVIDERS: SEE BELOW FOR SPECIFIC DISCHARGE INSTRUCTIONS PRINTED FOR PATIENT IN ADDITION TO GENERIC AVS PATIENT INFO    Discharge Instructions     Diet - low sodium heart healthy   Complete by: As directed    Increase activity slowly   Complete by: As directed          Discharge Diagnoses: Principal Problem:   COVID-19 virus infection       Hospital Course: Nancy Patton is a 62 y.o. female with PMH significant for obesity, OSA, DM2, HTN, asthma, pulmonary hypertension, breast cancer s/p lumpectomy, radiation. Patient presented to the ED 03/27/22 with complaint of shortness of breath progressively worsening for 1 week associated with cough, white sputum and runny nose. Hypoxia requiring supplemental O2. (+)COVID. Weaned to RA as of today 03/29/22   Consultants:  none  Procedures: none      ASSESSMENT & PLAN:   Principal Problem:   COVID-19 virus infection   COVID infection Acute respiratory failure with hypoxia  Complete course steroid w/ oral decadron 6 mg daily Complete course Paxlovid Continue supplemental O2 to keep sats >=90%, wean as tolerated   Asthma exacerbation Steroids as above cont daily bronchodilators as Breo and Incruse albuterol inhaler q6h scheduled   History of smoking Patient stopped smoking more than 30 years ago.  Denies  history of COPD.   Essential hypertension cont home losartan and HCTZ   Type 2 diabetes mellitus, well controlled Hyperglycemia exacerbated by steroid use --A1c 6.0.  PTA on Mounjaro every Saturday. --SSI increased to resistant scale   Obesity, BMI 38   Hx of Breast cancer  s/p lumpectomy, radiation. home letrozole held on admission               Discharge Instructions  Allergies as of 03/29/2022   No Known Allergies      Medication List     STOP taking these medications    methylPREDNISolone acetate 40 MG/ML injection Commonly known as: DEPO-MEDROL   predniSONE 10 MG tablet Commonly known as: DELTASONE       TAKE these medications    acetaminophen 325 MG tablet Commonly known as: TYLENOL Take 2 tablets (650 mg total) by mouth every 6 (six) hours as needed for mild pain (or Fever >/= 101).   albuterol 108 (90 Base) MCG/ACT inhaler Commonly known as: VENTOLIN HFA Inhale 2 puffs into the lungs every 6 (six) hours as needed for wheezing or shortness of breath.   budesonide 0.25 MG/2ML nebulizer solution Commonly known as: PULMICORT 0.25 mg daily.   citalopram 40 MG tablet Commonly known as: CELEXA Take 40 mg by mouth daily.   dexamethasone 6 MG tablet Commonly known as: DECADRON Take 1 tablet (6 mg total) by mouth daily for 4 days.   guaiFENesin 600 MG 12 hr tablet Commonly known as: MUCINEX Take 1-2 tablets (600-1,200 mg total) by mouth  2 (two) times daily as needed.   ipratropium-albuterol 0.5-2.5 (3) MG/3ML Soln Commonly known as: DUONEB Take 3 mLs by nebulization every 4 (four) hours as needed (shortness of breath / wheeze).   letrozole 2.5 MG tablet Commonly known as: FEMARA TAKE 1 TABLET BY MOUTH EVERY DAY (DON'T START MED UNTIL YOU COMPLETE RADIATION 1ST)   losartan-hydrochlorothiazide 50-12.5 MG tablet Commonly known as: HYZAAR Take 1 tablet by mouth daily.   montelukast 10 MG tablet Commonly known as: SINGULAIR Take 1 tablet  (10 mg total) by mouth every morning.   nirmatrelvir/ritonavir 20 x 150 MG & 10 x '100MG'$  Tabs Commonly known as: PAXLOVID Take 3 tablets by mouth 2 (two) times daily for 5 doses. Take nirmatrelvir (150 mg) two tablets twice daily for 5 days and ritonavir (100 mg) one tablet twice daily for 3.5 days - first dose evening 03/29/22   pantoprazole 40 MG tablet Commonly known as: PROTONIX Take 1 tablet (40 mg total) by mouth daily.   tirzepatide 5 MG/0.5ML Pen Commonly known as: MOUNJARO Inject 5 mg into the skin every Saturday.   Trelegy Ellipta 100-62.5-25 MCG/ACT Aepb Generic drug: Fluticasone-Umeclidin-Vilant Take 1 puff by mouth daily.          No Known Allergies   Subjective: pt feeling much better this morning, no concernd    Discharge Exam: BP 139/79 (BP Location: Right Arm)   Pulse 73   Temp 97.8 F (36.6 C)   Resp 17   Ht 5' 5.98" (1.676 m)   Wt 109.2 kg   SpO2 97%   BMI 38.88 kg/m  General: Pt is alert, awake, not in acute distress Cardiovascular: RRR, S1/S2 +, no rubs, no gallops Respiratory: CTA bilaterally, no wheezing, no rhonchi Abdominal: Soft, NT, ND, bowel sounds + Extremities: no edema, no cyanosis     The results of significant diagnostics from this hospitalization (including imaging, microbiology, ancillary and laboratory) are listed below for reference.     Microbiology: Recent Results (from the past 240 hour(s))  Resp panel by RT-PCR (RSV, Flu A&B, Covid) Anterior Nasal Swab     Status: Abnormal   Collection Time: 03/27/22  9:21 AM   Specimen: Anterior Nasal Swab  Result Value Ref Range Status   SARS Coronavirus 2 by RT PCR POSITIVE (A) NEGATIVE Final    Comment: (NOTE) SARS-CoV-2 target nucleic acids are DETECTED.  The SARS-CoV-2 RNA is generally detectable in upper respiratory specimens during the acute phase of infection. Positive results are indicative of the presence of the identified virus, but do not rule out bacterial  infection or co-infection with other pathogens not detected by the test. Clinical correlation with patient history and other diagnostic information is necessary to determine patient infection status. The expected result is Negative.  Fact Sheet for Patients: EntrepreneurPulse.com.au  Fact Sheet for Healthcare Providers: IncredibleEmployment.be  This test is not yet approved or cleared by the [place] FDA and  has been authorized for detection and/or diagnosis of SARS-CoV-2 by FDA under an Emergency Use Authorization (EUA).  This EUA will remain in effect (meaning this test can be used) for the duration of  the COVID-19 declaration under Section 564(b)(1) of the A ct, 21 U.S.C. section 360bbb-3(b)(1), unless the authorization is terminated or revoked sooner.     Influenza A by PCR NEGATIVE NEGATIVE Final   Influenza B by PCR NEGATIVE NEGATIVE Final    Comment: (NOTE) The Xpert Xpress SARS-CoV-2/FLU/RSV plus assay is intended as an aid in the diagnosis of influenza  from Nasopharyngeal swab specimens and should not be used as a sole basis for treatment. Nasal washings and aspirates are unacceptable for Xpert Xpress SARS-CoV-2/FLU/RSV testing.  Fact Sheet for Patients: EntrepreneurPulse.com.au  Fact Sheet for Healthcare Providers: IncredibleEmployment.be  This test is not yet approved or cleared by the [place] FDA and has been authorized for detection and/or diagnosis of SARS-CoV-2 by FDA under an Emergency Use Authorization (EUA). This EUA will remain in effect (meaning this test can be used) for the duration of the COVID-19 declaration under Section 564(b)(1) of the Act, 21 U.S.C. section 360bbb-3(b)(1), unless the authorization is terminated or revoked.     Resp Syncytial Virus by PCR NEGATIVE NEGATIVE Final    Comment: (NOTE) Fact Sheet for  Patients: EntrepreneurPulse.com.au  Fact Sheet for Healthcare Providers: IncredibleEmployment.be  This test is not yet approved or cleared by the [place] FDA and has been authorized for detection and/or diagnosis of SARS-CoV-2 by FDA under an Emergency Use Authorization (EUA). This EUA will remain in effect (meaning this test can be used) for the duration of the COVID-19 declaration under Section 564(b)(1) of the Act, 21 U.S.C. section 360bbb-3(b)(1), unless the authorization is terminated or revoked.  Performed at [facility], [address] [place], [place] [address]      Labs: BNP (last 3 results) Recent Labs    08/29/21 0703 10/13/21 1542 12/18/21 1437  BNP 18.6 9.4 19.1   Basic Metabolic Panel: Recent Labs  Lab 03/27/22 0853 03/28/22 0533 03/29/22 0440  NA 139 139 137  K 3.6 4.3 3.8  CL 105 103 102  CO2 '25 26 26  '$ GLUCOSE 134* 234* 178*  BUN '13 17 21  '$ CREATININE 0.91 0.77 0.76  CALCIUM 9.0 9.1 9.1  MG  --   --  2.0   Liver Function Tests: No results for input(s): "AST", "ALT", "ALKPHOS", "BILITOT", "PROT", "ALBUMIN" in the last 168 hours. No results for input(s): "LIPASE", "AMYLASE" in the last 168 hours. No results for input(s): "AMMONIA" in the last 168 hours. CBC: Recent Labs  Lab 03/27/22 0853 03/28/22 0533 03/29/22 0440  WBC 5.2 5.0 9.9  HGB 12.4 12.8 12.7  HCT 39.1 39.3 38.7  MCV 89.3 87.7 88.0  PLT 208 204 230   Cardiac Enzymes: No results for input(s): "CKTOTAL", "CKMB", "CKMBINDEX", "TROPONINI" in the last 168 hours. BNP: Invalid input(s): "POCBNP" CBG: Recent Labs  Lab 03/28/22 0815 03/28/22 1138 03/28/22 1535 03/28/22 2150 03/29/22 0818  GLUCAP 193* 185* 276* 265* 153*   D-Dimer No results for input(s): "DDIMER" in the last 72 hours. Hgb A1c Recent Labs    03/27/22 0853  HGBA1C 6.0*   Lipid Profile No results for input(s): "CHOL", "HDL", "LDLCALC", "TRIG", "CHOLHDL",  "LDLDIRECT" in the last 72 hours. Thyroid function studies No results for input(s): "TSH", "T4TOTAL", "T3FREE", "THYROIDAB" in the last 72 hours.  Invalid input(s): "FREET3" Anemia work up Recent Labs    03/27/22 0853 03/28/22 0533  FERRITIN 29 28   Urinalysis    Component Value Date/Time   COLORURINE YELLOW (A) 07/06/2021 0352   APPEARANCEUR HAZY (A) 07/06/2021 0352   LABSPEC 1.031 (H) 07/06/2021 0352   PHURINE 5.0 07/06/2021 0352   GLUCOSEU NEGATIVE 07/06/2021 0352   HGBUR NEGATIVE 07/06/2021 0352   BILIRUBINUR NEGATIVE 07/06/2021 0352   KETONESUR 20 (A) 07/06/2021 0352   PROTEINUR NEGATIVE 07/06/2021 0352   NITRITE NEGATIVE 07/06/2021 0352   LEUKOCYTESUR MODERATE (A) 07/06/2021 0352   Sepsis Labs Recent Labs  Lab 03/27/22 0853 03/28/22  0533 03/29/22 0440  WBC 5.2 5.0 9.9   Microbiology Recent Results (from the past 240 hour(s))  Resp panel by RT-PCR (RSV, Flu A&B, Covid) Anterior Nasal Swab     Status: Abnormal   Collection Time: 03/27/22  9:21 AM   Specimen: Anterior Nasal Swab  Result Value Ref Range Status   SARS Coronavirus 2 by RT PCR POSITIVE (A) NEGATIVE Final    Comment: (NOTE) SARS-CoV-2 target nucleic acids are DETECTED.  The SARS-CoV-2 RNA is generally detectable in upper respiratory specimens during the acute phase of infection. Positive results are indicative of the presence of the identified virus, but do not rule out bacterial infection or co-infection with other pathogens not detected by the test. Clinical correlation with patient history and other diagnostic information is necessary to determine patient infection status. The expected result is Negative.  Fact Sheet for Patients: EntrepreneurPulse.com.au  Fact Sheet for Healthcare Providers: IncredibleEmployment.be  This test is not yet approved or cleared by the [place] FDA and  has been authorized for detection and/or diagnosis of SARS-CoV-2  by FDA under an Emergency Use Authorization (EUA).  This EUA will remain in effect (meaning this test can be used) for the duration of  the COVID-19 declaration under Section 564(b)(1) of the A ct, 21 U.S.C. section 360bbb-3(b)(1), unless the authorization is terminated or revoked sooner.     Influenza A by PCR NEGATIVE NEGATIVE Final   Influenza B by PCR NEGATIVE NEGATIVE Final    Comment: (NOTE) The Xpert Xpress SARS-CoV-2/FLU/RSV plus assay is intended as an aid in the diagnosis of influenza from Nasopharyngeal swab specimens and should not be used as a sole basis for treatment. Nasal washings and aspirates are unacceptable for Xpert Xpress SARS-CoV-2/FLU/RSV testing.  Fact Sheet for Patients: EntrepreneurPulse.com.au  Fact Sheet for Healthcare Providers: IncredibleEmployment.be  This test is not yet approved or cleared by the [place] FDA and has been authorized for detection and/or diagnosis of SARS-CoV-2 by FDA under an Emergency Use Authorization (EUA). This EUA will remain in effect (meaning this test can be used) for the duration of the COVID-19 declaration under Section 564(b)(1) of the Act, 21 U.S.C. section 360bbb-3(b)(1), unless the authorization is terminated or revoked.     Resp Syncytial Virus by PCR NEGATIVE NEGATIVE Final    Comment: (NOTE) Fact Sheet for Patients: EntrepreneurPulse.com.au  Fact Sheet for Healthcare Providers: IncredibleEmployment.be  This test is not yet approved or cleared by the [place] FDA and has been authorized for detection and/or diagnosis of SARS-CoV-2 by FDA under an Emergency Use Authorization (EUA). This EUA will remain in effect (meaning this test can be used) for the duration of the COVID-19 declaration under Section 564(b)(1) of the Act, 21 U.S.C. section 360bbb-3(b)(1), unless the authorization is terminated or revoked.  Performed at  [facility], [address] [place], [place] [address]    Imaging DG Chest 2 View  Result Date: 03/27/2022 CLINICAL DATA:  Shortness of breath for 1 week EXAM: CHEST - 2 VIEW COMPARISON:  12/18/2021 FINDINGS: Normal heart size, mediastinal contours, and pulmonary vascularity. Minimal scarring RIGHT middle lobe. Lungs otherwise clear. No pulmonary infiltrate, pleural effusion, or pneumothorax. Osseous structures unremarkable. IMPRESSION: No acute abnormalities. Electronically Signed   By: Lavonia Dana M.D.   On: 03/27/2022 09:25      Time coordinating discharge: over 30 minutes  SIGNED:  Emeterio Reeve DO [facility]

## 2022-03-30 ENCOUNTER — Ambulatory Visit: Payer: 59 | Admitting: Family

## 2022-04-24 ENCOUNTER — Other Ambulatory Visit: Payer: Self-pay | Admitting: Internal Medicine

## 2022-05-01 ENCOUNTER — Ambulatory Visit
Admission: RE | Admit: 2022-05-01 | Discharge: 2022-05-01 | Disposition: A | Discharge status: Home / Self Care | Payer: 59 | Source: Ambulatory Visit | Attending: Surgery | Admitting: Surgery

## 2022-05-01 DIAGNOSIS — Z1231 Encounter for screening mammogram for malignant neoplasm of breast: Secondary | ICD-10-CM | POA: Diagnosis not present

## 2022-05-02 NOTE — Progress Notes (Deleted)
Surgical Clinic Progress/Follow-up Note   HPI:  62 y.o. Female presents to clinic for right breast cancer follow-up, she continues taking her Femara.  She now presents with follow-up mammographic imaging. She denies any breast pain, skin changes, new or suspicious nodularity or masses.  She reports having no symptoms secondary to taking her Femara and is maintaining calcium and D3 supplementation  Review of Systems:  Constitutional: denies fever/chills  ENT: denies sore throat, hearing problems  Respiratory: denies shortness of breath, wheezing  Cardiovascular: denies chest pain, palpitations  Gastrointestinal: denies abdominal pain, N/V, or diarrhea Skin: Denies any other rashes or skin discolorations  Vital Signs:  There were no vitals taken for this visit.   Physical Exam:  Constitutional:  -- Obese body habitus  -- Awake, alert, and oriented x3  Pulmonary:  -- No crackles -- Equal breath sounds bilaterally -- Breathing non-labored at rest Cardiovascular:  -- S1, S2 present  -- No pericardial rubs  Gastrointestinal:  -- Soft and non-distended, non-tender GU  --Levada Dy *** present as chaperone, left breast remains pendulous, slightly asymmetric secondary to radiation changes reducing the previously pendulous right breast.  Skin changes are continuing to diminish on the right side.  There is no suspicious, nor dominant nodularity or masses present in either breast. Musculoskeletal / Integumentary:  -- Wounds or skin discoloration: None appreciated -- Extremities: B/L UE and LE FROM, hands and feet warm   Laboratory studies: Nothing pertinent.  Imaging:   CLINICAL DATA:  62 year old female status post malignant right lumpectomy in April 2021. No current breast related symptoms.   EXAM: DIGITAL SCREENING BILATERAL MAMMOGRAM WITH TOMOSYNTHESIS AND CAD   TECHNIQUE: Bilateral screening digital craniocaudal and mediolateral oblique mammograms were obtained. Bilateral screening  digital breast tomosynthesis was performed. The images were evaluated with computer-aided detection.   COMPARISON:  Previous exam(s).   ACR Breast Density Category b: There are scattered areas of fibroglandular density.   FINDINGS: There are no findings suspicious for malignancy.   IMPRESSION: No mammographic evidence of malignancy. A result letter of this screening mammogram will be mailed directly to the patient.   RECOMMENDATION: Screening mammogram in one year. (Code:SM-B-01Y)   BI-RADS CATEGORY  1: Negative.     Electronically Signed   By: Everlean Alstrom M.D.   On: 05/02/2022 13:51  Assessment:  62 y.o. yo Female with a problem list including...  Patient Active Problem List   Diagnosis Date Noted   COVID-19 virus infection 03/27/2022   Abnormal CT scan, chest 12/19/2021   Acute asthma exacerbation 12/18/2021   GERD (gastroesophageal reflux disease) 10/14/2021   Pulmonary hypertension ([place]) 10/14/2021   Obesity 10/14/2021   Hypokalemia 10/14/2021   Asthma exacerbation 10/13/2021   Acute respiratory failure with hypoxia ([place]) 07/06/2021   CAP (community acquired pneumonia) 07/06/2021   Sepsis ([place]) 07/05/2021   Multifocal pneumonia 05/13/2021   History of breast cancer 11/06/2020   Status post breast lumpectomy 05/29/2019   Cancer of right breast, stage 1, estrogen receptor positive ([place]) 05/15/2019   COPD exacerbation ([place]) 08/24/2015   HTN (hypertension) 07/10/2015   Type 2 diabetes mellitus ([place]) 07/10/2015   Depression 07/10/2015   Acute respiratory distress 09/26/2014   Asthmatic bronchitis with acute exacerbation 09/26/2014   Asthmatic bronchitis with exacerbation 09/26/2014    presents to clinic for follow-up evaluation of right breast cancer, 3 years postdiagnosis, progressing well.  Plan:              - return to clinic in 1 year with  follow-up screening or diagnostic imaging as needed, instructed to call office if any questions or concerns  All  of the above recommendations were discussed with the patient, and all of patient's questions were answered to her expressed satisfaction.  Ronny Bacon, MD, FACS [place]: [place] for exceptional care. Office: [phone]

## 2022-05-04 ENCOUNTER — Telehealth: Payer: Self-pay

## 2022-05-04 NOTE — Telephone Encounter (Signed)
Pt called and left vm regarding new insurance won't cover rx mounjaro, she asked if there's an alternate rx we can send for her? Please advise

## 2022-05-09 ENCOUNTER — Ambulatory Visit: Payer: 59 | Admitting: Surgery

## 2022-05-09 ENCOUNTER — Telehealth: Payer: Self-pay | Admitting: Surgery

## 2022-05-09 ENCOUNTER — Ambulatory Visit: Payer: BC Managed Care – PPO | Admitting: Surgery

## 2022-05-09 NOTE — Telephone Encounter (Signed)
Patient missed her follow up appointment today after yearly mammogram.  Left message for patient to call so that we can get her appointment rescheduled.

## 2022-05-20 ENCOUNTER — Emergency Department (HOSPITAL_COMMUNITY)
Admission: EM | Admit: 2022-05-20 | Discharge: 2022-05-21 | Disposition: A | Discharge status: Home / Self Care | Payer: 59 | Attending: Emergency Medicine | Admitting: Emergency Medicine

## 2022-05-20 ENCOUNTER — Encounter (HOSPITAL_COMMUNITY): Payer: Self-pay | Admitting: Emergency Medicine

## 2022-05-20 ENCOUNTER — Emergency Department (HOSPITAL_COMMUNITY): Payer: 59

## 2022-05-20 DIAGNOSIS — R0602 Shortness of breath: Secondary | ICD-10-CM | POA: Diagnosis not present

## 2022-05-20 DIAGNOSIS — J45901 Unspecified asthma with (acute) exacerbation: Secondary | ICD-10-CM

## 2022-05-20 DIAGNOSIS — Z20822 Contact with and (suspected) exposure to covid-19: Secondary | ICD-10-CM | POA: Insufficient documentation

## 2022-05-20 DIAGNOSIS — Z7951 Long term (current) use of inhaled steroids: Secondary | ICD-10-CM | POA: Diagnosis not present

## 2022-05-20 DIAGNOSIS — J9811 Atelectasis: Secondary | ICD-10-CM | POA: Diagnosis not present

## 2022-05-20 LAB — COMPREHENSIVE METABOLIC PANEL
ALT: 12 U/L (ref 0–44)
AST: 20 U/L (ref 15–41)
Albumin: 3.5 g/dL (ref 3.5–5.0)
Alkaline Phosphatase: 62 U/L (ref 38–126)
Anion gap: 11 (ref 5–15)
BUN: 15 mg/dL (ref 8–23)
CO2: 25 mmol/L (ref 22–32)
Calcium: 8.8 mg/dL — ABNORMAL LOW (ref 8.9–10.3)
Chloride: 101 mmol/L (ref 98–111)
Creatinine, Ser: 0.92 mg/dL (ref 0.44–1.00)
GFR, Estimated: >60 mL/min (ref >60)
Glucose, Bld: 178 mg/dL — ABNORMAL HIGH (ref 70–99)
Potassium: 4 mmol/L (ref 3.5–5.1)
Sodium: 137 mmol/L (ref 135–145)
Total Bilirubin: 0.7 mg/dL (ref 0.3–1.2)
Total Protein: 6.3 g/dL — ABNORMAL LOW (ref 6.5–8.1)

## 2022-05-20 LAB — CBC WITH DIFFERENTIAL/PLATELET
Abs Immature Granulocytes: 0.02 10*3/uL (ref 0.00–0.07)
Basophils Absolute: 0 10*3/uL (ref 0.0–0.1)
Basophils Relative: 1 %
Eosinophils Absolute: 0.7 10*3/uL — ABNORMAL HIGH (ref 0.0–0.5)
Eosinophils Relative: 9 %
HCT: 39.8 % (ref 36.0–46.0)
Hemoglobin: 13 g/dL (ref 12.0–15.0)
Immature Granulocytes: 0 %
Lymphocytes Relative: 28 %
Lymphs Abs: 2.2 10*3/uL (ref 0.7–4.0)
MCH: 29 pg (ref 26.0–34.0)
MCHC: 32.7 g/dL (ref 30.0–36.0)
MCV: 88.8 fL (ref 80.0–100.0)
Monocytes Absolute: 0.7 10*3/uL (ref 0.1–1.0)
Monocytes Relative: 8 %
Neutro Abs: 4.4 10*3/uL (ref 1.7–7.7)
Neutrophils Relative %: 54 %
Platelets: 202 10*3/uL (ref 150–400)
RBC: 4.48 MIL/uL (ref 3.87–5.11)
RDW: 13.5 % (ref 11.5–15.5)
WBC: 8.1 10*3/uL (ref 4.0–10.5)
nRBC: 0 % (ref 0.0–0.2)

## 2022-05-20 LAB — RESP PANEL BY RT-PCR (RSV, FLU A&B, COVID)  RVPGX2
Influenza A by PCR: NEGATIVE
Influenza B by PCR: NEGATIVE
Resp Syncytial Virus by PCR: NEGATIVE
SARS Coronavirus 2 by RT PCR: NEGATIVE

## 2022-05-20 MED ORDER — IPRATROPIUM-ALBUTEROL 0.5-2.5 (3) MG/3ML IN SOLN
3.0000 mL | Freq: Once | RESPIRATORY_TRACT | Status: AC
  Administered 2022-05-21: 3 mL via RESPIRATORY_TRACT
  Filled 2022-05-20: qty 3

## 2022-05-20 MED ORDER — ALBUTEROL SULFATE (2.5 MG/3ML) 0.083% IN NEBU
2.5000 mg | INHALATION_SOLUTION | Freq: Once | RESPIRATORY_TRACT | Status: AC
  Administered 2022-05-21: 2.5 mg via RESPIRATORY_TRACT
  Filled 2022-05-20: qty 3

## 2022-05-20 MED ORDER — METHYLPREDNISOLONE SODIUM SUCC 125 MG IJ SOLR: 125.0000 mg | Freq: Every day | INTRAMUSCULAR | Status: DC

## 2022-05-20 NOTE — ED Notes (Signed)
RT called to assess pt ?

## 2022-05-20 NOTE — ED Provider Notes (Signed)
[facility] Provider Note   CSN: [id number] Arrival date & time: 05/20/22  [id number]     History {Add pertinent medical, surgical, social history, OB history to HPI:1} Chief Complaint  Patient presents with   Shortness of Breath    Nancy Patton is a 62 y.o. female.  Patient presents to the emergency department for evaluation of cough and shortness of breath.  Patient reports a history of asthma and has multiple episodes similar to this every year.  She has been using her rescue inhaler but still feels short of breath and is wheezing.  She has had cough, nasal congestion for the last couple of days.       Home Medications Prior to Admission medications   Medication Sig Start Date End Date Taking? Authorizing Provider  acetaminophen (TYLENOL) 325 MG tablet Take 2 tablets (650 mg total) by mouth every 6 (six) hours as needed for mild pain (or Fever >/= 101). 03/29/22   Emeterio Reeve, DO  albuterol (VENTOLIN HFA) [phone] Base) MCG/ACT inhaler Inhale 2 puffs into the lungs every 6 (six) hours as needed for wheezing or shortness of breath. 10/16/21   Emeterio Reeve, DO  budesonide (PULMICORT) 0.25 MG/2ML nebulizer solution 0.25 mg daily.    [provider]  citalopram (CELEXA) 40 MG tablet Take 40 mg by mouth daily.     [provider]  guaiFENesin (MUCINEX) 600 MG 12 hr tablet Take 1-2 tablets (600-1,200 mg total) by mouth 2 (two) times daily as needed. 10/16/21   Emeterio Reeve, DO  ipratropium-albuterol (DUONEB) 0.5-2.5 (3) MG/3ML SOLN INHALE 1 VIAL VIA NEBULIZER THREE TIMES DAILY AS NEEDED 04/24/22   Perrin Maltese, MD  letrozole (FEMARA) 2.5 MG tablet TAKE 1 TABLET BY MOUTH EVERY DAY (DON'T START MED UNTIL YOU COMPLETE RADIATION 1ST) 04/25/21   Sindy Guadeloupe, MD  losartan-hydrochlorothiazide (HYZAAR) 50-12.5 MG per tablet Take 1 tablet by mouth daily.    [provider]  montelukast (SINGULAIR) 10 MG  tablet Take 1 tablet (10 mg total) by mouth every morning. 10/16/21   Emeterio Reeve, DO  pantoprazole (PROTONIX) 40 MG tablet Take 1 tablet (40 mg total) by mouth daily. 10/16/21   Emeterio Reeve, DO  tirzepatide [facility]) 5 MG/0.5ML Pen Inject 5 mg into the skin every Saturday.    [provider]  Donnal Debar 100-62.5-25 MCG/ACT AEPB Take 1 puff by mouth daily. 10/16/21   Emeterio Reeve, DO      Allergies    Patient has no known allergies.    Review of Systems   Review of Systems  Physical Exam Updated Vital Signs BP 137/75 (BP Location: Right Arm)   Pulse (!) 101   Temp 98.6 F (37 C)   Resp (!) 22   SpO2 93%  Physical Exam Vitals and nursing note reviewed.  Constitutional:      General: She is not in acute distress.    Appearance: She is well-developed.  HENT:     Head: Normocephalic and atraumatic.     Mouth/Throat:     Mouth: Mucous membranes are moist.  Eyes:     General: Vision grossly intact. Gaze aligned appropriately.     Extraocular Movements: Extraocular movements intact.     Conjunctiva/sclera: Conjunctivae normal.  Cardiovascular:     Rate and Rhythm: Normal rate and regular rhythm.     Pulses: Normal pulses.     Heart sounds: Normal heart sounds, S1 normal and S2 normal. No  murmur heard.    No friction rub. No gallop.  Pulmonary:     Effort: Pulmonary effort is normal. No respiratory distress.     Breath sounds: Wheezing present.  Abdominal:     General: Bowel sounds are normal.     Palpations: Abdomen is soft.     Tenderness: There is no abdominal tenderness. There is no guarding or rebound.     Hernia: No hernia is present.  Musculoskeletal:        General: No swelling.     Cervical back: Full passive range of motion without pain, normal range of motion and neck supple. No spinous process tenderness or muscular tenderness. Normal range of motion.     Right lower leg: No edema.     Left lower leg: No edema.  Skin:    General:  Skin is warm and dry.     Capillary Refill: Capillary refill takes less than 2 seconds.     Findings: No ecchymosis, erythema, rash or wound.  Neurological:     General: No focal deficit present.     Mental Status: She is alert and oriented to person, place, and time.     GCS: GCS eye subscore is 4. GCS verbal subscore is 5. GCS motor subscore is 6.     Cranial Nerves: Cranial nerves 2-12 are intact.     Sensory: Sensation is intact.     Motor: Motor function is intact.     Coordination: Coordination is intact.  Psychiatric:        Attention and Perception: Attention normal.        Mood and Affect: Mood normal.        Speech: Speech normal.        Behavior: Behavior normal.     ED Results / Procedures / Treatments   Labs (all labs ordered are listed, but only abnormal results are displayed) Labs Reviewed  COMPREHENSIVE METABOLIC PANEL - Abnormal; Notable for the following components:      Result Value   Glucose, Bld 178 (*)    Calcium 8.8 (*)    Total Protein 6.3 (*)    All other components within normal limits  CBC WITH DIFFERENTIAL/PLATELET - Abnormal; Notable for the following components:   Eosinophils Absolute 0.7 (*)    All other components within normal limits  RESP PANEL BY RT-PCR (RSV, FLU A&B, COVID)  [id number]    EKG EKG Interpretation  Date/Time:  Saturday May 20 2022 19:52:54 EDT Ventricular Rate:  100 PR Interval:  134 QRS Duration: 76 QT Interval:  390 QTC Calculation: 503 R Axis:   52 Text Interpretation: Normal sinus rhythm Low voltage QRS Prolonged QT Abnormal ECG When compared with ECG of 27-Mar-2022 08:50, No acute changes Confirmed by Orpah Greek [phone]) on 05/20/2022 11:15:53 PM  Radiology DG Chest 2 View  Result Date: 05/20/2022 CLINICAL DATA:  Cough, chest tightness, and flu-like symptoms. EXAM: CHEST - 2 VIEW COMPARISON:  03/27/2022. FINDINGS: The heart size and mediastinal contours are within normal limits. Chronic interstitial  prominence is present bilaterally. Atelectasis is noted at the left lung base. No consolidation, effusion, or pneumothorax. Surgical clips are present in the right breast. No acute osseous abnormality. IMPRESSION: No active cardiopulmonary disease. Electronically Signed   By: Brett Fairy M.D.   On: 05/20/2022 21:12    Procedures Procedures  {Document cardiac monitor, telemetry assessment procedure when appropriate:1}  Medications Ordered in ED Medications  methylPREDNISolone sodium succinate (SOLU-MEDROL) 125 mg/2 mL injection 125  mg (has no administration in time range)  ipratropium-albuterol (DUONEB) 0.5-2.5 (3) MG/3ML nebulizer solution 3 mL (has no administration in time range)  albuterol (PROVENTIL) (2.5 MG/3ML) 0.083% nebulizer solution 2.5 mg (has no administration in time range)    ED Course/ Medical Decision Making/ A&P   {   Click here for ABCD2, HEART and other calculatorsREFRESH Note before signing :1}                          Medical Decision Making Amount and/or Complexity of Data Reviewed Labs: ordered. Radiology: ordered.  Risk Prescription drug management.   ***  {Document critical care time when appropriate:1} {Document review of labs and clinical decision tools ie heart score, Chads2Vasc2 etc:1}  {Document your independent review of radiology images, and any outside records:1} {Document your discussion with family members, caretakers, and with consultants:1} {Document social determinants of health affecting pt's care:1} {Document your decision making why or why not admission, treatments were needed:1} Final Clinical Impression(s) / ED Diagnoses Final diagnoses:  None    Rx / DC Orders ED Discharge Orders     None

## 2022-05-20 NOTE — ED Triage Notes (Signed)
PT has asthma and flair ups at times where she will have to be hospitalized. She has rescue inhalers and breathing tx she does at home. Has URI and feels SOB. Denies chest pain or fevers. SOB upon exertion but not at rest. Productive cough.

## 2022-05-21 MED ORDER — PREDNISONE 20 MG PO TABS: 40.0000 mg | ORAL_TABLET | Freq: Every day | ORAL | 0 refills | Status: DC

## 2022-05-21 MED ORDER — DOXYCYCLINE HYCLATE 100 MG PO CAPS: 100.0000 mg | ORAL_CAPSULE | Freq: Two times a day (BID) | ORAL | 0 refills | Status: DC

## 2022-05-21 MED ORDER — METHYLPREDNISOLONE SODIUM SUCC 125 MG IJ SOLR
125.0000 mg | Freq: Every day | INTRAMUSCULAR | Status: DC
  Administered 2022-05-21: 125 mg via INTRAVENOUS
  Filled 2022-05-21: qty 2

## 2022-05-26 ENCOUNTER — Other Ambulatory Visit: Payer: Self-pay | Admitting: Family

## 2022-05-26 ENCOUNTER — Other Ambulatory Visit: Payer: Self-pay | Admitting: Oncology

## 2022-06-14 ENCOUNTER — Other Ambulatory Visit: Payer: Self-pay | Admitting: Oncology

## 2022-06-26 ENCOUNTER — Other Ambulatory Visit: Payer: Self-pay | Admitting: Internal Medicine

## 2022-06-26 DIAGNOSIS — J4531 Mild persistent asthma with (acute) exacerbation: Secondary | ICD-10-CM

## 2022-06-26 DIAGNOSIS — J4541 Moderate persistent asthma with (acute) exacerbation: Secondary | ICD-10-CM | POA: Diagnosis not present

## 2022-06-27 ENCOUNTER — Inpatient Hospital Stay: Payer: 59 | Attending: Oncology | Admitting: Oncology

## 2022-06-27 ENCOUNTER — Encounter: Payer: Self-pay | Admitting: Oncology

## 2022-06-27 VITALS — BP 117/75 | HR 79 | Temp 97.4°F | Resp 18 | Ht 65.0 in | Wt 251.6 lb

## 2022-06-27 DIAGNOSIS — Z79811 Long term (current) use of aromatase inhibitors: Secondary | ICD-10-CM

## 2022-06-27 DIAGNOSIS — M8588 Other specified disorders of bone density and structure, other site: Secondary | ICD-10-CM | POA: Diagnosis not present

## 2022-06-27 DIAGNOSIS — C50911 Malignant neoplasm of unspecified site of right female breast: Secondary | ICD-10-CM

## 2022-06-27 DIAGNOSIS — Z923 Personal history of irradiation: Secondary | ICD-10-CM | POA: Diagnosis not present

## 2022-06-27 DIAGNOSIS — Z170 Estrogen receptor positive status [ER+]: Secondary | ICD-10-CM | POA: Diagnosis not present

## 2022-06-27 DIAGNOSIS — Z87891 Personal history of nicotine dependence: Secondary | ICD-10-CM | POA: Insufficient documentation

## 2022-06-27 NOTE — Progress Notes (Signed)
Hematology/Oncology Consult note [facility]  [phone] [phone]  Patient Care Team: Margaretann Loveless, MD as PCP - General (Internal Medicine) Campbell Lerner, MD as Consulting Physician (General Surgery) Jim Like, RN as Registered Nurse Carmina Miller, MD as Radiation Oncologist (Radiation Oncology) Creig Hines, MD as Consulting Physician (Oncology)   Name of the patient: Nancy Patton  [id number]  Oct 14, 1960   Date of visit: 06/27/22  Diagnosis- pathological prognostic stage IA pT1c pN0 cM0 ER positive PR positive HER-2 negative s/p lumpectomy   Chief complaint/ Reason for visit-routine follow-up of breast cancer on letrozole  Heme/Onc history: Patient is a 62 year old female who recently underwent a screening mammogram which showed a possible mass in the right breast.  This was followed by diagnostic mammogram and ultrasound which showed an irregular related mass 0.6 x 0.6 x 1 cm at the 9 o'clock position 6 cm from the nipple.  This was biopsied and was consistent with invasive mammary carcinoma 7 mm grade ER 91 200% positive PR 1 to 10% positive and HER-2 negative.    Final pathology in March 2021 showed invasive mammary carcinoma no special type. 12mm, grade 2.  1 sentinel lymph node negative for malignancy.  Margins negative.  Oncotype score came back at 15 and patient did not require adjuvant chemotherapy.  Patient went on to finish adjuvant radiation treatment.  Letrozole started in August 2021  Interval history-tolerating letrozole well without any significant side effects.  She has been having some ongoing pain involving her left arm for which she will be seen her primary care doctor soon.  She is also compliant with calcium and vitamin D.  Denies any breast complaints today  ECOG PS- 1 Pain scale- 0   Review of systems- Review of Systems  Constitutional:  Positive for malaise/fatigue. Negative for chills,  fever and weight loss.  HENT:  Negative for congestion, ear discharge and nosebleeds.   Eyes:  Negative for blurred vision.  Respiratory:  Negative for cough, hemoptysis, sputum production, shortness of breath and wheezing.   Cardiovascular:  Negative for chest pain, palpitations, orthopnea and claudication.  Gastrointestinal:  Negative for abdominal pain, blood in stool, constipation, diarrhea, heartburn, melena, nausea and vomiting.  Genitourinary:  Negative for dysuria, flank pain, frequency, hematuria and urgency.  Musculoskeletal:  Negative for back pain, joint pain and myalgias.  Skin:  Negative for rash.  Neurological:  Negative for dizziness, tingling, focal weakness, seizures, weakness and headaches.  Endo/Heme/Allergies:  Does not bruise/bleed easily.  Psychiatric/Behavioral:  Negative for depression and suicidal ideas. The patient does not have insomnia.       No Known Allergies   Past Medical History:  Diagnosis Date   Arthritis    Asthma    Breast cancer (HCC)    Diabetes mellitus without complication (HCC)    Hypertension    Obesity    Personal history of radiation therapy    Pulmonary hypertension (HCC)    Sleep apnea      Past Surgical History:  Procedure Laterality Date   BREAST LUMPECTOMY Right 05/23/2019   invasive DCIS radation    CATARACT EXTRACTION     CESAREAN SECTION     x 3     Social History   Socioeconomic History   Marital status: Single    Spouse name: Not on file   Number of children: Not on file   Years of education: Not on file   Highest education level: Not on  file  Occupational History   Not on file  Tobacco Use   Smoking status: Former    Types: Cigarettes   Smokeless tobacco: Never   Tobacco comments:    Quit smoking 30 years ago.  Vaping Use   Vaping Use: Never used  Substance and Sexual Activity   Alcohol use: No   Drug use: No   Sexual activity: Not Currently  Other Topics Concern   Not on file  Social History  Narrative   Not on file   Social Determinants of Health   Financial Resource Strain: Not on file  Food Insecurity: No Food Insecurity (03/27/2022)   Hunger Vital Sign    Worried About Running Out of Food in the Last Year: Never true    Ran Out of Food in the Last Year: Never true  Transportation Needs: No Transportation Needs (03/27/2022)   PRAPARE - Administrator, Civil Service (Medical): No    Lack of Transportation (Non-Medical): No  Physical Activity: Not on file  Stress: Not on file  Social Connections: Not on file  Intimate Partner Violence: Not At Risk (03/27/2022)   Humiliation, Afraid, Rape, and Kick questionnaire    Fear of Current or Ex-Partner: No    Emotionally Abused: No    Physically Abused: No    Sexually Abused: No    Family History  Problem Relation Age of Onset   Diabetes Mother    Stroke Mother    Diabetes Father    Diabetes Brother    Cancer Neg Hx    Heart failure Neg Hx    Hypertension Neg Hx    Breast cancer Neg Hx      Current Outpatient Medications:    acetaminophen (TYLENOL) 325 MG tablet, Take 2 tablets (650 mg total) by mouth every 6 (six) hours as needed for mild pain (or Fever >/= 101)., Disp: , Rfl:    albuterol (VENTOLIN HFA) 108 (90 Base) MCG/ACT inhaler, Inhale 2 puffs into the lungs every 6 (six) hours as needed for wheezing or shortness of breath., Disp: 18 g, Rfl: 0   budesonide (PULMICORT) 0.25 MG/2ML nebulizer solution, 0.25 mg daily., Disp: , Rfl:    citalopram (CELEXA) 40 MG tablet, Take 40 mg by mouth daily. , Disp: , Rfl: 10   doxycycline (VIBRAMYCIN) 100 MG capsule, Take 1 capsule (100 mg total) by mouth 2 (two) times daily., Disp: 14 capsule, Rfl: 0   guaiFENesin (MUCINEX) 600 MG 12 hr tablet, Take 1-2 tablets (600-1,200 mg total) by mouth 2 (two) times daily as needed., Disp: 30 tablet, Rfl: 0   ipratropium-albuterol (DUONEB) 0.5-2.5 (3) MG/3ML SOLN, INHALE 1 VIAL VIA NEBULIZER THREE TIMES DAILY AS NEEDED, Disp: 270 mL,  Rfl: 1   letrozole (FEMARA) 2.5 MG tablet, Take 1 tablet (2.5 mg total) by mouth daily., Disp: 15 tablet, Rfl: 0   losartan-hydrochlorothiazide (HYZAAR) 50-12.5 MG per tablet, Take 1 tablet by mouth daily., Disp: , Rfl: 5   montelukast (SINGULAIR) 10 MG tablet, TAKE 1 TABLET BY MOUTH EVERY DAY, Disp: 90 tablet, Rfl: 2   pantoprazole (PROTONIX) 40 MG tablet, Take 1 tablet (40 mg total) by mouth daily., Disp: 30 tablet, Rfl: 0   predniSONE (DELTASONE) 20 MG tablet, Take 2 tablets (40 mg total) by mouth daily with breakfast., Disp: 10 tablet, Rfl: 0   tirzepatide (MOUNJARO) 5 MG/0.5ML Pen, Inject 5 mg into the skin every Saturday., Disp: , Rfl:    TRELEGY ELLIPTA 100-62.5-25 MCG/ACT AEPB, Take 1 puff  by mouth daily., Disp: 60 each, Rfl: 0  Physical exam:  Vitals:   06/27/22 0950  BP: 117/75  Pulse: 79  Resp: 18  Temp: (!) 97.4 F (36.3 C)  TempSrc: Tympanic  SpO2: 98%  Weight: 251 lb 9.6 oz (114.1 kg)  Height: 5\' 5"  (1.651 m)   Physical Exam Cardiovascular:     Rate and Rhythm: Normal rate and regular rhythm.     Heart sounds: Normal heart sounds.  Pulmonary:     Effort: Pulmonary effort is normal.     Breath sounds: Normal breath sounds.  Abdominal:     General: Bowel sounds are normal.     Palpations: Abdomen is soft.  Skin:    General: Skin is warm and dry.  Neurological:     Mental Status: She is alert and oriented to person, place, and time.    Breast exam was performed in seated and lying down position. Patient is status post right lumpectomy with a well-healed surgical scar. No evidence of any palpable masses. No evidence of axillary adenopathy. No evidence of any palpable masses or lumps in the left breast. No evidence of leftt axillary adenopathy      Latest Ref Rng & Units 05/20/2022    8:24 PM  CMP  Glucose 70 - 99 mg/dL 161   BUN 8 - 23 mg/dL 15   Creatinine 0.96 - 1.00 mg/dL 0.45   Sodium 409 - 811 mmol/L 137   Potassium 3.5 - 5.1 mmol/L 4.0   Chloride 98 -  111 mmol/L 101   CO2 22 - 32 mmol/L 25   Calcium 8.9 - 10.3 mg/dL 8.8   Total Protein 6.5 - 8.1 g/dL 6.3   Total Bilirubin 0.3 - 1.2 mg/dL 0.7   Alkaline Phos 38 - 126 U/L 62   AST 15 - 41 U/L 20   ALT 0 - 44 U/L 12       Latest Ref Rng & Units 05/20/2022    8:24 PM  CBC  WBC 4.0 - 10.5 K/uL 8.1   Hemoglobin 12.0 - 15.0 g/dL 91.4   Hematocrit 78.2 - 46.0 % 39.8   Platelets 150 - 400 K/uL 202      Assessment and plan- Patient is a 62 y.o. female with invasive mammary carcinoma of the right breast pathological prognostic stage I APT1CPN0 ER/PR positive HER-2 negative s/p lumpectomy adjuvant radiation therapy.  This is a follow-up visit of breast cancer on letrozole  Tolerating letrozole well which she started taking in August 2021.  She will complete 5 years in August 2026.Patient is overdue for her bone density scan.  Her last bone density was done in June 2021 which showed osteopenia with a T-score of -1.6 in her left hip.  10-year probability of a major osteoporotic fracture was less than 20% and major hip fracture was less than 3%.  I will plan to repeat her bone density scan sometime in the next 1 to 2 months.  I will see her back in 6 months no labs.  Her recent mammogram from March 2024 was unremarkable   Visit Diagnosis 1. Use of letrozole (Femara)   2. Cancer of right breast, stage 1, estrogen receptor positive (HCC)   3. Osteopenia of lumbar spine      Dr. Owens Shark, MD, MPH [facility] [phone] 06/27/2022 10:21 AM

## 2022-07-06 ENCOUNTER — Other Ambulatory Visit: Payer: Self-pay | Admitting: Oncology

## 2022-07-07 MED ORDER — LETROZOLE 2.5 MG PO TABS: 2.5000 mg | ORAL_TABLET | Freq: Every day | ORAL | 0 refills | Status: DC

## 2022-07-24 ENCOUNTER — Encounter: Payer: Self-pay | Admitting: Family

## 2022-07-24 ENCOUNTER — Ambulatory Visit: Payer: 59 | Admitting: Family

## 2022-07-24 VITALS — BP 130/74 | HR 80 | Ht 65.0 in | Wt 253.0 lb

## 2022-07-24 DIAGNOSIS — E782 Mixed hyperlipidemia: Secondary | ICD-10-CM

## 2022-07-24 DIAGNOSIS — E538 Deficiency of other specified B group vitamins: Secondary | ICD-10-CM

## 2022-07-24 DIAGNOSIS — C50911 Malignant neoplasm of unspecified site of right female breast: Secondary | ICD-10-CM | POA: Diagnosis not present

## 2022-07-24 DIAGNOSIS — R5383 Other fatigue: Secondary | ICD-10-CM | POA: Diagnosis not present

## 2022-07-24 DIAGNOSIS — Z170 Estrogen receptor positive status [ER+]: Secondary | ICD-10-CM

## 2022-07-24 DIAGNOSIS — E66813 Obesity, class 3: Secondary | ICD-10-CM

## 2022-07-24 DIAGNOSIS — E559 Vitamin D deficiency, unspecified: Secondary | ICD-10-CM | POA: Diagnosis not present

## 2022-07-24 DIAGNOSIS — J4550 Severe persistent asthma, uncomplicated: Secondary | ICD-10-CM | POA: Diagnosis not present

## 2022-07-24 DIAGNOSIS — I10 Essential (primary) hypertension: Secondary | ICD-10-CM

## 2022-07-24 DIAGNOSIS — E1165 Type 2 diabetes mellitus with hyperglycemia: Secondary | ICD-10-CM

## 2022-07-24 LAB — POCT CBG (FASTING - GLUCOSE)-MANUAL ENTRY: Glucose Fasting, POC: 146 mg/dL — AB (ref 70–99)

## 2022-07-24 MED ORDER — TIRZEPATIDE 5 MG/0.5ML ~~LOC~~ SOAJ: 5.0000 mg | SUBCUTANEOUS | 3 refills | Status: DC

## 2022-07-24 NOTE — Assessment & Plan Note (Signed)
Blood pressure well controlled with current medications.  Continue current therapy.  Will reassess at follow up.  

## 2022-07-24 NOTE — Assessment & Plan Note (Signed)
Checking labs today. Will call pt. With results  Continue current diabetes POC, as patient has been well controlled on current regimen.  Will adjust meds if needed based on labs.  

## 2022-07-24 NOTE — Assessment & Plan Note (Signed)
Patient is managed by oncology.  She is in remission per her most recent visit there and is doing well.   Will defer to them for any further treatment/changes.

## 2022-07-24 NOTE — Progress Notes (Signed)
Established Patient Office Visit  Subjective:  Patient ID: Nancy Patton, female    DOB: [birth year]  Age: 62 y.o. MRN: [id number]  Chief Complaint  Patient presents with   Follow-up    Follow up    Patient is here today for her 3 months follow up.  She has been feeling well since last appointment.   She does not have additional concerns to discuss today.  Labs are due today. She needs refills.   I have reviewed her active problem list, medication list, allergies, notes from last encounter, lab results for her appointment today.     No other concerns at this time.   Past Medical History:  Diagnosis Date   Acute asthma exacerbation 12/18/2021   Acute respiratory distress 09/26/2014   Acute respiratory failure with hypoxia (HCC) 07/06/2021   Arthritis    Asthma    Asthma exacerbation 10/13/2021   Asthmatic bronchitis with acute exacerbation 09/26/2014   Asthmatic bronchitis with exacerbation 09/26/2014   Breast cancer (HCC)    Cancer of right breast, stage 1, estrogen receptor positive (HCC) 05/15/2019   Formatting of this note might be different from the original. Last Assessment & Plan: Formatting of this note is different from the original. - Resumed home letrozole 2.5 mg daily   CAP (community acquired pneumonia) 07/06/2021   COVID-19 virus infection 03/27/2022   Diabetes mellitus without complication (HCC)    History of breast cancer 11/06/2020   Hypertension    Multifocal pneumonia 05/13/2021   Obesity    Personal history of radiation therapy    Pulmonary hypertension (HCC)    Sepsis (HCC) 07/05/2021   Sleep apnea     Past Surgical History:  Procedure Laterality Date   BREAST LUMPECTOMY Right 05/23/2019   invasive DCIS radation    CATARACT EXTRACTION     CESAREAN SECTION     x 3     Social History   Socioeconomic History   Marital status: Single    Spouse name: Not on file   Number of children: Not on file   Years of education: Not on  file   Highest education level: Not on file  Occupational History   Not on file  Tobacco Use   Smoking status: Former    Types: Cigarettes   Smokeless tobacco: Never   Tobacco comments:    Quit smoking 30 years ago.  Vaping Use   Vaping Use: Never used  Substance and Sexual Activity   Alcohol use: No   Drug use: No   Sexual activity: Not Currently  Other Topics Concern   Not on file  Social History Narrative   Not on file   Social Determinants of Health   Financial Resource Strain: Not on file  Food Insecurity: No Food Insecurity (03/27/2022)   Hunger Vital Sign    Worried About Running Out of Food in the Last Year: Never true    Ran Out of Food in the Last Year: Never true  Transportation Needs: No Transportation Needs (03/27/2022)   PRAPARE - Administrator, Civil Service (Medical): No    Lack of Transportation (Non-Medical): No  Physical Activity: Not on file  Stress: Not on file  Social Connections: Not on file  Intimate Partner Violence: Not At Risk (03/27/2022)   Humiliation, Afraid, Rape, and Kick questionnaire    Fear of Current or Ex-Partner: No    Emotionally Abused: No    Physically Abused: No    Sexually Abused: No  Family History  Problem Relation Age of Onset   Diabetes Mother    Stroke Mother    Diabetes Father    Diabetes Brother    Cancer Neg Hx    Heart failure Neg Hx    Hypertension Neg Hx    Breast cancer Neg Hx     No Known Allergies  Review of Systems  All other systems reviewed and are negative.      Objective:   BP 130/74   Pulse 80   Ht 5\' 5"  (1.651 m)   Wt 253 lb (114.8 kg)   SpO2 92%   BMI 42.10 kg/m   Vitals:   07/24/22 0920  BP: 130/74  Pulse: 80  Height: 5\' 5"  (1.651 m)  Weight: 253 lb (114.8 kg)  SpO2: 92%  BMI (Calculated): 42.1    Physical Exam Vitals and nursing note reviewed.  Constitutional:      Appearance: Normal appearance. She is obese.  HENT:     Head: Normocephalic and atraumatic.   Eyes:     Extraocular Movements: Extraocular movements intact.     Conjunctiva/sclera: Conjunctivae normal.     Pupils: Pupils are equal, round, and reactive to light.  Cardiovascular:     Rate and Rhythm: Normal rate.     Pulses: Normal pulses.  Pulmonary:     Effort: Pulmonary effort is normal.     Breath sounds: Normal breath sounds.  Musculoskeletal:        General: Normal range of motion.  Neurological:     General: No focal deficit present.     Mental Status: She is alert and oriented to person, place, and time. Mental status is at baseline.  Psychiatric:        Mood and Affect: Mood normal.        Behavior: Behavior normal.        Thought Content: Thought content normal.        Judgment: Judgment normal.      Results for orders placed or performed in visit on 07/24/22  POCT CBG (Fasting - Glucose)  Result Value Ref Range   Glucose Fasting, POC 146 (A) 70 - 99 mg/dL    Recent Results (from the past 2160 hour(s))  Resp panel by RT-PCR (RSV, Flu A&B, Covid) Anterior Nasal Swab     Status: None   Collection Time: 05/20/22  8:22 PM   Specimen: Anterior Nasal Swab  Result Value Ref Range   SARS Coronavirus 2 by RT PCR NEGATIVE NEGATIVE   Influenza A by PCR NEGATIVE NEGATIVE   Influenza B by PCR NEGATIVE NEGATIVE    Comment: (NOTE) The Xpert Xpress SARS-CoV-2/FLU/RSV plus assay is intended as an aid in the diagnosis of influenza from Nasopharyngeal swab specimens and should not be used as a sole basis for treatment. Nasal washings and aspirates are unacceptable for Xpert Xpress SARS-CoV-2/FLU/RSV testing.  Fact Sheet for Patients: BloggerCourse.com  Fact Sheet for Healthcare Providers: SeriousBroker.it  This test is not yet approved or cleared by the [place] FDA and has been authorized for detection and/or diagnosis of SARS-CoV-2 by FDA under an Emergency Use Authorization (EUA). This EUA will remain in  effect (meaning this test can be used) for the duration of the COVID-19 declaration under Section 564(b)(1) of the Act, 21 U.S.C. section 360bbb-3(b)(1), unless the authorization is terminated or revoked.     Resp Syncytial Virus by PCR NEGATIVE NEGATIVE    Comment: (NOTE) Fact Sheet for Patients: BloggerCourse.com  Fact Sheet for  Healthcare Providers: SeriousBroker.it  This test is not yet approved or cleared by the [place] and has been authorized for detection and/or diagnosis of SARS-CoV-2 by FDA under an Emergency Use Authorization (EUA). This EUA will remain in effect (meaning this test can be used) for the duration of the COVID-19 declaration under Section 564(b)(1) of the Act, 21 U.S.C. section 360bbb-3(b)(1), unless the authorization is terminated or revoked.  Performed at [facility], [address] [place], [place] [address]   Comprehensive metabolic panel     Status: Abnormal   Collection Time: 05/20/22  8:24 PM  Result Value Ref Range   Sodium 137 135 - 145 mmol/L   Potassium 4.0 3.5 - 5.1 mmol/L   Chloride 101 98 - 111 mmol/L   CO2 25 22 - 32 mmol/L   Glucose, Bld 178 (H) 70 - 99 mg/dL    Comment: Glucose reference range applies only to samples taken after fasting for at least 8 hours.   BUN 15 8 - 23 mg/dL   Creatinine, Ser 4.69 0.44 - 1.00 mg/dL   Calcium 8.8 (L) 8.9 - 10.3 mg/dL   Total Protein 6.3 (L) 6.5 - 8.1 g/dL   Albumin 3.5 3.5 - 5.0 g/dL   AST 20 15 - 41 U/L   ALT 12 0 - 44 U/L   Alkaline Phosphatase 62 38 - 126 U/L   Total Bilirubin 0.7 0.3 - 1.2 mg/dL   GFR, Estimated >62 >95 mL/min    Comment: (NOTE) Calculated using the CKD-EPI Creatinine Equation (2021)    Anion gap 11 5 - 15    Comment: Performed at [facility], [address] [place], [place] [address]  CBC with Differential     Status: Abnormal   Collection Time: 05/20/22  8:24 PM  Result Value Ref Range   WBC  8.1 4.0 - 10.5 K/uL   RBC 4.48 3.87 - 5.11 MIL/uL   Hemoglobin 13.0 12.0 - 15.0 g/dL   HCT 24.4 01.0 - 27.2 %   MCV 88.8 80.0 - 100.0 fL   MCH 29.0 26.0 - 34.0 pg   MCHC 32.7 30.0 - 36.0 g/dL   RDW 53.6 64.4 - 03.4 %   Platelets 202 150 - 400 K/uL   nRBC 0.0 0.0 - 0.2 %   Neutrophils Relative % 54 %   Neutro Abs 4.4 1.7 - 7.7 K/uL   Lymphocytes Relative 28 %   Lymphs Abs 2.2 0.7 - 4.0 K/uL   Monocytes Relative 8 %   Monocytes Absolute 0.7 0.1 - 1.0 K/uL   Eosinophils Relative 9 %   Eosinophils Absolute 0.7 (H) 0.0 - 0.5 K/uL   Basophils Relative 1 %   Basophils Absolute 0.0 0.0 - 0.1 K/uL   Immature Granulocytes 0 %   Abs Immature Granulocytes 0.02 0.00 - 0.07 K/uL    Comment: Performed at [facility], [address] [place], [place] [address]  POCT CBG (Fasting - Glucose)     Status: Abnormal   Collection Time: 07/24/22  9:23 AM  Result Value Ref Range   Glucose Fasting, POC 146 (A) 70 - 99 mg/dL       Assessment & Plan:   Problem List Items Addressed This Visit       Active Problems   Essential hypertension    Blood pressure well controlled with current medications.  Continue current therapy.  Will reassess at follow up.       Controlled type  2 diabetes mellitus without complication, without long-term current use of insulin (HCC) - Primary    Checking labs today. Will call pt. With results  Continue current diabetes POC, as patient has been well controlled on current regimen.  Will adjust meds if needed based on labs.       Relevant Medications   tirzepatide [facility]) 5 MG/0.5ML Pen (Start on 07/29/2022)   Cancer of right breast, stage 1, estrogen receptor positive (HCC)    Patient is managed by oncology.  She is in remission per her most recent visit there and is doing well.   Will defer to them for any further treatment/changes.        Obesity, Class III, BMI 40-49.9 (morbid obesity) (HCC)    Continue current meds.  Will adjust as needed based on  results.  The patient is asked to make an attempt to improve diet and exercise patterns to aid in medical management of this problem. Addressed importance of increasing and maintaining water intake.        Relevant Medications   tirzepatide [facility]) 5 MG/0.5ML Pen (Start on 07/29/2022)   Severe persistent asthma, uncomplicated    Patient has started fasenra at pulmonology, and is currently well managed.   Will defer to pulmonology for any changes to meds       Mixed hyperlipidemia    Checking labs today.  Continue current therapy for lipid control. Will modify as needed based on labwork results.        Relevant Orders   Lipid panel   CBC With Differential   CMP14+EGFR   Other Visit Diagnoses     Vitamin D deficiency, unspecified       Checking labs today.  Will continue supplements as needed.   Relevant Orders   VITAMIN D 25 Hydroxy (Vit-D Deficiency, Fractures)   CBC With Differential   CMP14+EGFR   Other fatigue       Relevant Orders   CBC With Differential   CMP14+EGFR   TSH   B12 deficiency due to diet       Checking labs today.  Will continue supplements as needed.   Relevant Orders   CBC With Differential   CMP14+EGFR   Vitamin B12        Return in about 3 months (around 10/24/2022) for F/U.   Total time spent: 30 minutes  Miki Kins, FNP  07/24/2022   This document may have been prepared by [facility] Voice Recognition software and as such may include unintentional dictation errors.

## 2022-07-24 NOTE — Assessment & Plan Note (Signed)
Continue current meds.  Will adjust as needed based on results.  The patient is asked to make an attempt to improve diet and exercise patterns to aid in medical management of this problem. Addressed importance of increasing and maintaining water intake.   

## 2022-07-24 NOTE — Assessment & Plan Note (Signed)
Patient has started fasenra at pulmonology, and is currently well managed.   Will defer to pulmonology for any changes to meds

## 2022-07-24 NOTE — Assessment & Plan Note (Signed)
Checking labs today.  Continue current therapy for lipid control. Will modify as needed based on labwork results.  

## 2022-07-25 ENCOUNTER — Telehealth: Payer: Self-pay

## 2022-07-25 NOTE — Telephone Encounter (Signed)
Pt called and left vm regarding rx mounjaro not covered by insurance, she asked about an alternative rx trulicity? Please advise

## 2022-07-26 ENCOUNTER — Ambulatory Visit
Admission: RE | Admit: 2022-07-26 | Discharge: 2022-07-26 | Disposition: A | Discharge status: Home / Self Care | Payer: 59 | Source: Ambulatory Visit | Attending: Oncology | Admitting: Oncology

## 2022-07-26 DIAGNOSIS — Z170 Estrogen receptor positive status [ER+]: Secondary | ICD-10-CM | POA: Diagnosis not present

## 2022-07-26 DIAGNOSIS — C50911 Malignant neoplasm of unspecified site of right female breast: Secondary | ICD-10-CM

## 2022-07-26 DIAGNOSIS — Z780 Asymptomatic menopausal state: Secondary | ICD-10-CM | POA: Diagnosis not present

## 2022-07-26 DIAGNOSIS — M8588 Other specified disorders of bone density and structure, other site: Secondary | ICD-10-CM

## 2022-07-26 DIAGNOSIS — Z79811 Long term (current) use of aromatase inhibitors: Secondary | ICD-10-CM | POA: Insufficient documentation

## 2022-07-26 DIAGNOSIS — M85852 Other specified disorders of bone density and structure, left thigh: Secondary | ICD-10-CM | POA: Diagnosis not present

## 2022-07-26 LAB — CBC WITH DIFFERENTIAL
Basophils Absolute: 0 10*3/uL (ref 0.0–0.2)
Basos: 0 %
EOS (ABSOLUTE): 0 10*3/uL (ref 0.0–0.4)
Eos: 0 %
Hematocrit: 41.9 % (ref 34.0–46.6)
Hemoglobin: 13.5 g/dL (ref 11.1–15.9)
Immature Grans (Abs): 0 10*3/uL (ref 0.0–0.1)
Immature Granulocytes: 0 %
Lymphocytes Absolute: 1.7 10*3/uL (ref 0.7–3.1)
Lymphs: 40 %
MCH: 28.3 pg (ref 26.6–33.0)
MCHC: 32.2 g/dL (ref 31.5–35.7)
MCV: 88 fL (ref 79–97)
Monocytes Absolute: 0.6 10*3/uL (ref 0.1–0.9)
Monocytes: 14 %
Neutrophils Absolute: 1.9 10*3/uL (ref 1.4–7.0)
Neutrophils: 46 %
RBC: 4.77 x10E6/uL (ref 3.77–5.28)
RDW: 12.7 % (ref 11.7–15.4)
WBC: 4.2 10*3/uL (ref 3.4–10.8)

## 2022-07-26 LAB — CMP14+EGFR
ALT: 13 IU/L (ref 0–32)
AST: 15 IU/L (ref 0–40)
Albumin/Globulin Ratio: 1.8 (ref 1.2–2.2)
Albumin: 4.2 g/dL (ref 3.9–4.9)
Alkaline Phosphatase: 80 IU/L (ref 44–121)
BUN/Creatinine Ratio: 13 (ref 12–28)
BUN: 10 mg/dL (ref 8–27)
Bilirubin Total: 0.5 mg/dL (ref 0.0–1.2)
CO2: 24 mmol/L (ref 20–29)
Calcium: 9.2 mg/dL (ref 8.7–10.3)
Chloride: 102 mmol/L (ref 96–106)
Creatinine, Ser: 0.77 mg/dL (ref 0.57–1.00)
Globulin, Total: 2.4 g/dL (ref 1.5–4.5)
Glucose: 137 mg/dL — ABNORMAL HIGH (ref 70–99)
Potassium: 4.4 mmol/L (ref 3.5–5.2)
Sodium: 142 mmol/L (ref 134–144)
Total Protein: 6.6 g/dL (ref 6.0–8.5)
eGFR: 87 mL/min/{1.73_m2} (ref >59)

## 2022-07-26 LAB — HEMOGLOBIN A1C
Est. average glucose Bld gHb Est-mCnc: 148 mg/dL
Hgb A1c MFr Bld: 6.8 % — ABNORMAL HIGH (ref 4.8–5.6)

## 2022-07-26 LAB — LIPID PANEL
Chol/HDL Ratio: 2.6 ratio (ref 0.0–4.4)
Cholesterol, Total: 177 mg/dL (ref 100–199)
HDL: 67 mg/dL (ref >39)
LDL Chol Calc (NIH): 84 mg/dL (ref 0–99)
Triglycerides: 152 mg/dL — ABNORMAL HIGH (ref 0–149)
VLDL Cholesterol Cal: 26 mg/dL (ref 5–40)

## 2022-07-26 LAB — VITAMIN D 25 HYDROXY (VIT D DEFICIENCY, FRACTURES): Vit D, 25-Hydroxy: 47.4 ng/mL (ref 30.0–100.0)

## 2022-07-26 LAB — TSH: TSH: 3.02 u[IU]/mL (ref 0.450–4.500)

## 2022-07-26 LAB — VITAMIN B12: Vitamin B-12: 418 pg/mL (ref 232–1245)

## 2022-07-28 ENCOUNTER — Other Ambulatory Visit: Payer: Self-pay | Admitting: Family

## 2022-07-29 ENCOUNTER — Other Ambulatory Visit: Payer: Self-pay | Admitting: Oncology

## 2022-08-03 ENCOUNTER — Emergency Department
Admission: EM | Admit: 2022-08-03 | Discharge: 2022-08-03 | Disposition: A | Discharge status: Home / Self Care | Payer: 59 | Attending: Emergency Medicine | Admitting: Emergency Medicine

## 2022-08-03 ENCOUNTER — Encounter: Payer: Self-pay | Admitting: Emergency Medicine

## 2022-08-03 ENCOUNTER — Other Ambulatory Visit: Payer: Self-pay

## 2022-08-03 DIAGNOSIS — R112 Nausea with vomiting, unspecified: Secondary | ICD-10-CM

## 2022-08-03 DIAGNOSIS — J45909 Unspecified asthma, uncomplicated: Secondary | ICD-10-CM | POA: Insufficient documentation

## 2022-08-03 DIAGNOSIS — E119 Type 2 diabetes mellitus without complications: Secondary | ICD-10-CM | POA: Insufficient documentation

## 2022-08-03 DIAGNOSIS — Z853 Personal history of malignant neoplasm of breast: Secondary | ICD-10-CM | POA: Insufficient documentation

## 2022-08-03 DIAGNOSIS — K2900 Acute gastritis without bleeding: Secondary | ICD-10-CM | POA: Diagnosis not present

## 2022-08-03 DIAGNOSIS — R748 Abnormal levels of other serum enzymes: Secondary | ICD-10-CM | POA: Insufficient documentation

## 2022-08-03 LAB — CBC
HCT: 44.1 % (ref 36.0–46.0)
Hemoglobin: 14.1 g/dL (ref 12.0–15.0)
MCH: 27.6 pg (ref 26.0–34.0)
MCHC: 32 g/dL (ref 30.0–36.0)
MCV: 86.5 fL (ref 80.0–100.0)
Platelets: 269 10*3/uL (ref 150–400)
RBC: 5.1 MIL/uL (ref 3.87–5.11)
RDW: 12.5 % (ref 11.5–15.5)
WBC: 4.8 10*3/uL (ref 4.0–10.5)
nRBC: 0 % (ref 0.0–0.2)

## 2022-08-03 LAB — COMPREHENSIVE METABOLIC PANEL
ALT: 16 U/L (ref 0–44)
AST: 27 U/L (ref 15–41)
Albumin: 4.5 g/dL (ref 3.5–5.0)
Alkaline Phosphatase: 66 U/L (ref 38–126)
Anion gap: 11 (ref 5–15)
BUN: 17 mg/dL (ref 8–23)
CO2: 25 mmol/L (ref 22–32)
Calcium: 9.3 mg/dL (ref 8.9–10.3)
Chloride: 101 mmol/L (ref 98–111)
Creatinine, Ser: 0.83 mg/dL (ref 0.44–1.00)
GFR, Estimated: >60 mL/min (ref >60)
Glucose, Bld: 111 mg/dL — ABNORMAL HIGH (ref 70–99)
Potassium: 4.3 mmol/L (ref 3.5–5.1)
Sodium: 137 mmol/L (ref 135–145)
Total Bilirubin: 1.4 mg/dL — ABNORMAL HIGH (ref 0.3–1.2)
Total Protein: 7.3 g/dL (ref 6.5–8.1)

## 2022-08-03 LAB — LIPASE, BLOOD: Lipase: 59 U/L — ABNORMAL HIGH (ref 11–51)

## 2022-08-03 MED ORDER — TRULICITY 1.5 MG/0.5ML ~~LOC~~ SOAJ
1.5000 mg | SUBCUTANEOUS | 2 refills | Status: DC
Start: 2022-08-03 — End: 2023-03-23

## 2022-08-03 MED ORDER — ONDANSETRON 4 MG PO TBDP
4.0000 mg | ORAL_TABLET | Freq: Once | ORAL | Status: AC
  Administered 2022-08-03: 4 mg via ORAL
  Filled 2022-08-03: qty 1

## 2022-08-03 MED ORDER — ONDANSETRON 4 MG PO TBDP: 4.0000 mg | ORAL_TABLET | Freq: Three times a day (TID) | ORAL | 0 refills | Status: DC | PRN

## 2022-08-03 NOTE — ED Triage Notes (Signed)
Pt to ED for emesis started last night. Denies diarrhea.

## 2022-08-03 NOTE — ED Provider Notes (Signed)
[facility] Provider Note    Event Date/Time   First MD Initiated Contact with Patient 08/03/22 1400     (approximate)   History   Emesis   HPI  Nancy Patton is a 62 y.o. female with history of diabetes, breast cancer, asthma, GERD who presents with complaints of nausea vomiting which started last night.  No diarrhea.  No abdominal pain.  Nausea continues today although she is somewhat better.  No sick contacts     Physical Exam   Triage Vital Signs: ED Triage Vitals  Enc Vitals Group     BP 08/03/22 1351 121/80     Pulse Rate 08/03/22 1351 86     Resp 08/03/22 1351 20     Temp 08/03/22 1351 97.8 F (36.6 C)     Temp src --      SpO2 08/03/22 1351 95 %     Weight 08/03/22 1352 112 kg (247 lb)     Height 08/03/22 1352 1.676 m (5\' 6" )     Head Circumference --      Peak Flow --      Pain Score 08/03/22 1352 0     Pain Loc --      Pain Edu? --      Excl. in GC? --     Most recent vital signs: Vitals:   08/03/22 1351  BP: 121/80  Pulse: 86  Resp: 20  Temp: 97.8 F (36.6 C)  SpO2: 95%     General: Awake, no distress.  CV:  Good peripheral perfusion.  Resp:  Normal effort.  Abd:  No distention.  Soft, nontender, reassuring exam Other:     ED Results / Procedures / Treatments   Labs (all labs ordered are listed, but only abnormal results are displayed) Labs Reviewed  LIPASE, BLOOD - Abnormal; Notable for the following components:      Result Value   Lipase 59 (*)    All other components within normal limits  COMPREHENSIVE METABOLIC PANEL - Abnormal; Notable for the following components:   Glucose, Bld 111 (*)    Total Bilirubin 1.4 (*)    All other components within normal limits  CBC  URINALYSIS, ROUTINE W REFLEX MICROSCOPIC     EKG     RADIOLOGY     PROCEDURES:  Critical Care performed:   Procedures   MEDICATIONS ORDERED IN ED: Medications  ondansetron (ZOFRAN-ODT) disintegrating tablet 4  mg (4 mg Oral Given 08/03/22 1417)     IMPRESSION / MDM / ASSESSMENT AND PLAN / ED COURSE  I reviewed the triage vital signs and the nursing notes. Patient's presentation is most consistent with acute illness / injury with system symptoms.  Patient presents with nausea vomiting as detailed above, differential includes gastritis, foodborne illness, viral illness  Overall well-appearing and in no acute distress, no abdominal pain or tenderness to palpation, lab work reviewed, mild elevation of lipase noted, no abdominal tenderness or abdominal pain, not consistent with pancreatitis  Will treat with ODT Zofran, prescription provided, supportive care, outpatient follow-up, return precautions discussed.        FINAL CLINICAL IMPRESSION(S) / ED DIAGNOSES   Final diagnoses:  Nausea and vomiting, unspecified vomiting type  Acute gastritis without hemorrhage, unspecified gastritis type     Rx / DC Orders   ED Discharge Orders          Ordered    ondansetron (ZOFRAN-ODT) 4 MG disintegrating tablet  Every 8 hours PRN  08/03/22 1535             Note:  This document was prepared using Dragon voice recognition software and may include unintentional dictation errors.   Jene Every, MD 08/03/22 1539

## 2022-08-03 NOTE — Addendum Note (Signed)
Addended by: Grayling Congress on: 08/03/2022 03:58 PM   Modules accepted: Orders

## 2022-08-03 NOTE — ED Notes (Signed)
See triage note  Presents with some n/v  States this started last pm while at work  and has cont'd today  Denies and fever or diarrhea

## 2022-08-29 ENCOUNTER — Other Ambulatory Visit: Payer: Self-pay | Admitting: Family

## 2022-08-29 ENCOUNTER — Other Ambulatory Visit: Payer: Self-pay | Admitting: Internal Medicine

## 2022-08-29 DIAGNOSIS — J4531 Mild persistent asthma with (acute) exacerbation: Secondary | ICD-10-CM

## 2022-09-10 ENCOUNTER — Other Ambulatory Visit: Payer: Self-pay | Admitting: Family

## 2022-09-10 DIAGNOSIS — M26601 Right temporomandibular joint disorder, unspecified: Secondary | ICD-10-CM

## 2022-09-28 ENCOUNTER — Encounter: Payer: Self-pay | Admitting: Family

## 2022-10-24 ENCOUNTER — Encounter: Payer: Self-pay | Admitting: Family

## 2022-10-24 ENCOUNTER — Ambulatory Visit: Payer: 59 | Admitting: Family

## 2022-10-24 VITALS — BP 125/85 | HR 70 | Ht 65.0 in | Wt 255.0 lb

## 2022-10-24 DIAGNOSIS — E1165 Type 2 diabetes mellitus with hyperglycemia: Secondary | ICD-10-CM

## 2022-10-24 DIAGNOSIS — E559 Vitamin D deficiency, unspecified: Secondary | ICD-10-CM

## 2022-10-24 DIAGNOSIS — E782 Mixed hyperlipidemia: Secondary | ICD-10-CM

## 2022-10-24 DIAGNOSIS — E119 Type 2 diabetes mellitus without complications: Secondary | ICD-10-CM

## 2022-10-24 DIAGNOSIS — J4551 Severe persistent asthma with (acute) exacerbation: Secondary | ICD-10-CM

## 2022-10-24 DIAGNOSIS — Z1211 Encounter for screening for malignant neoplasm of colon: Secondary | ICD-10-CM

## 2022-10-24 DIAGNOSIS — E538 Deficiency of other specified B group vitamins: Secondary | ICD-10-CM | POA: Diagnosis not present

## 2022-10-24 DIAGNOSIS — E66813 Obesity, class 3: Secondary | ICD-10-CM

## 2022-10-24 DIAGNOSIS — I10 Essential (primary) hypertension: Secondary | ICD-10-CM

## 2022-10-24 LAB — POC CREATINE & ALBUMIN,URINE
Albumin/Creatinine Ratio, Urine, POC: <30
Creatinine, POC: 50 mg/dL
Microalbumin Ur, POC: 10 mg/L

## 2022-10-24 LAB — POCT CBG (FASTING - GLUCOSE)-MANUAL ENTRY: Glucose Fasting, POC: 131 mg/dL — AB (ref 70–99)

## 2022-10-24 NOTE — Assessment & Plan Note (Signed)
Checking labs today.  Continue current therapy for lipid control. Will modify as needed based on labwork results.  

## 2022-10-24 NOTE — Progress Notes (Signed)
Established Patient Office Visit  Subjective:  Patient ID: Nancy Patton, female    DOB: November 19, 1960  Age: 62 y.o. MRN: [id number]  Chief Complaint  Patient presents with   Follow-up    3 MO F/U    Patient is here today for her 3 months follow up.  She has been feeling well since last appointment.   She does have additional concerns to discuss today.  Has been started by pulmonology on Fasenra, says that this has helped significantly with her symptoms.  Since she is feeling better overall, she asks if we can start making sure that her health maintenance items are up to date.   Labs are due today.  Also due for pap smear, colon cancer screening, microalbumin, and DEE. She needs refills.   I have reviewed her active problem list, medication list, allergies, notes from last encounter, lab results for her appointment today.      No other concerns at this time.   Past Medical History:  Diagnosis Date   Acute asthma exacerbation 12/18/2021   Acute respiratory distress 09/26/2014   Acute respiratory failure with hypoxia (HCC) 07/06/2021   Arthritis    Asthma    Asthma exacerbation 10/13/2021   Asthmatic bronchitis with acute exacerbation 09/26/2014   Asthmatic bronchitis with exacerbation 09/26/2014   Breast cancer (HCC)    Cancer of right breast, stage 1, estrogen receptor positive (HCC) 05/15/2019   Formatting of this note might be different from the original. Last Assessment & Plan: Formatting of this note is different from the original. - Resumed home letrozole 2.5 mg daily   CAP (community acquired pneumonia) 07/06/2021   COVID-19 virus infection 03/27/2022   Diabetes mellitus without complication (HCC)    History of breast cancer 11/06/2020   Hypertension    Hypokalemia 10/14/2021   Multifocal pneumonia 05/13/2021   Obesity    Personal history of radiation therapy    Pulmonary hypertension (HCC)    Sepsis (HCC) 07/05/2021   Sleep apnea     Past  Surgical History:  Procedure Laterality Date   BREAST LUMPECTOMY Right 05/23/2019   invasive DCIS radation    CATARACT EXTRACTION     CESAREAN SECTION     x 3     Social History   Socioeconomic History   Marital status: Single    Spouse name: Not on file   Number of children: Not on file   Years of education: Not on file   Highest education level: Not on file  Occupational History   Not on file  Tobacco Use   Smoking status: Former    Types: Cigarettes   Smokeless tobacco: Never   Tobacco comments:    Quit smoking 30 years ago.  Vaping Use   Vaping status: Never Used  Substance and Sexual Activity   Alcohol use: No   Drug use: No   Sexual activity: Not Currently  Other Topics Concern   Not on file  Social History Narrative   Not on file   Social Determinants of Health   Financial Resource Strain: Not on file  Food Insecurity: No Food Insecurity (03/27/2022)   Hunger Vital Sign    Worried About Running Out of Food in the Last Year: Never true    Ran Out of Food in the Last Year: Never true  Transportation Needs: No Transportation Needs (03/27/2022)   PRAPARE - Administrator, Civil Service (Medical): No    Lack of Transportation (Non-Medical): No  Physical Activity: Not on file  Stress: Not on file  Social Connections: Not on file  Intimate Partner Violence: Not At Risk (03/27/2022)   Humiliation, Afraid, Rape, and Kick questionnaire    Fear of Current or Ex-Partner: No    Emotionally Abused: No    Physically Abused: No    Sexually Abused: No    Family History  Problem Relation Age of Onset   Diabetes Mother    Stroke Mother    Diabetes Father    Diabetes Brother    Cancer Neg Hx    Heart failure Neg Hx    Hypertension Neg Hx    Breast cancer Neg Hx     No Known Allergies  Review of Systems  All other systems reviewed and are negative.      Objective:   BP 125/85   Pulse 70   Ht 5\' 5"  (1.651 m)   Wt 255 lb (115.7 kg)   SpO2 92%    BMI 42.43 kg/m   Vitals:   10/24/22 0958  BP: 125/85  Pulse: 70  Height: 5\' 5"  (1.651 m)  Weight: 255 lb (115.7 kg)  SpO2: 92%  BMI (Calculated): 42.43    Physical Exam Vitals and nursing note reviewed.  Constitutional:      Appearance: Normal appearance. She is obese.  HENT:     Head: Normocephalic.  Eyes:     Extraocular Movements: Extraocular movements intact.     Conjunctiva/sclera: Conjunctivae normal.     Pupils: Pupils are equal, round, and reactive to light.  Cardiovascular:     Rate and Rhythm: Normal rate.  Pulmonary:     Effort: Pulmonary effort is normal.  Musculoskeletal:        General: Normal range of motion.  Neurological:     General: No focal deficit present.     Mental Status: She is alert and oriented to person, place, and time. Mental status is at baseline.  Psychiatric:        Mood and Affect: Mood normal.        Behavior: Behavior normal.        Thought Content: Thought content normal.        Judgment: Judgment normal.      Results for orders placed or performed in visit on 10/24/22  POCT CBG (Fasting - Glucose)  Result Value Ref Range   Glucose Fasting, POC 131 (A) 70 - 99 mg/dL  POC CREATINE & ALBUMIN,URINE  Result Value Ref Range   Microalbumin Ur, POC 10 mg/L   Creatinine, POC 50 mg/dL   Albumin/Creatinine Ratio, Urine, POC <30     Recent Results (from the past 2160 hour(s))  Lipase, blood     Status: Abnormal   Collection Time: 08/03/22  1:54 PM  Result Value Ref Range   Lipase 59 (H) 11 - 51 U/L    Comment: Performed at [facility], [address] [place], [place] [address]  Comprehensive metabolic panel     Status: Abnormal   Collection Time: 08/03/22  1:54 PM  Result Value Ref Range   Sodium 137 135 - 145 mmol/L   Potassium 4.3 3.5 - 5.1 mmol/L    Comment: HEMOLYSIS AT THIS LEVEL MAY AFFECT RESULT   Chloride 101 98 - 111 mmol/L   CO2 25 22 - 32 mmol/L   Glucose, Bld 111 (H) 70 - 99 mg/dL    Comment:  Glucose reference range applies only to samples taken after fasting for at least 8  hours.   BUN 17 8 - 23 mg/dL   Creatinine, Ser 7.82 0.44 - 1.00 mg/dL   Calcium 9.3 8.9 - 95.6 mg/dL   Total Protein 7.3 6.5 - 8.1 g/dL   Albumin 4.5 3.5 - 5.0 g/dL   AST 27 15 - 41 U/L    Comment: HEMOLYSIS AT THIS LEVEL MAY AFFECT RESULT   ALT 16 0 - 44 U/L    Comment: HEMOLYSIS AT THIS LEVEL MAY AFFECT RESULT   Alkaline Phosphatase 66 38 - 126 U/L   Total Bilirubin 1.4 (H) 0.3 - 1.2 mg/dL    Comment: HEMOLYSIS AT THIS LEVEL MAY AFFECT RESULT   GFR, Estimated >60 >60 mL/min    Comment: (NOTE) Calculated using the CKD-EPI Creatinine Equation (2021)    Anion gap 11 5 - 15    Comment: Performed at [facility], [address] [place], [place] [address]  CBC     Status: None   Collection Time: 08/03/22  1:54 PM  Result Value Ref Range   WBC 4.8 4.0 - 10.5 K/uL   RBC 5.10 3.87 - 5.11 MIL/uL   Hemoglobin 14.1 12.0 - 15.0 g/dL   HCT 65.7 84.6 - 96.2 %   MCV 86.5 80.0 - 100.0 fL   MCH 27.6 26.0 - 34.0 pg   MCHC 32.0 30.0 - 36.0 g/dL   RDW 95.2 84.1 - 32.4 %   Platelets 269 150 - 400 K/uL   nRBC 0.0 0.0 - 0.2 %    Comment: Performed at [facility], [address] [place], [place] [address]  POCT CBG (Fasting - Glucose)     Status: Abnormal   Collection Time: 10/24/22 10:05 AM  Result Value Ref Range   Glucose Fasting, POC 131 (A) 70 - 99 mg/dL  POC CREATINE & ALBUMIN,URINE     Status: Normal   Collection Time: 10/24/22 10:45 AM  Result Value Ref Range   Microalbumin Ur, POC 10 mg/L   Creatinine, POC 50 mg/dL   Albumin/Creatinine Ratio, Urine, POC <30        Assessment & Plan:   Problem List Items Addressed This Visit       Active Problems   Essential hypertension    Blood pressure well controlled with current medications.  Continue current therapy.  Will reassess at follow up.        Controlled type 2 diabetes mellitus without complication, without long-term  current use of insulin (HCC)    Checking labs today. Will call pt. With results  Continue current diabetes POC, as patient has been well controlled on current regimen.  Will adjust meds if needed based on labs.       Relevant Orders   CMP14+EGFR   CBC with Differential/Platelet   Obesity, Class III, BMI 40-49.9 (morbid obesity) (HCC)    Continue current meds.  Will adjust as needed based on results.  The patient is asked to make an attempt to improve diet and exercise patterns to aid in medical management of this problem. Addressed importance of increasing and maintaining water intake.       Relevant Orders   CMP14+EGFR   CBC with Differential/Platelet   Mixed hyperlipidemia    Checking labs today.  Continue current therapy for lipid control. Will modify as needed based on labwork results.         Relevant Orders   Lipid panel   CMP14+EGFR   CBC with Differential/Platelet   Other Visit Diagnoses     Type  2 diabetes mellitus with hyperglycemia, without long-term current use of insulin (HCC)    -  Primary   Relevant Orders   POCT CBG (Fasting - Glucose) (Completed)   CMP14+EGFR   Hemoglobin A1c   CBC with Differential/Platelet   POC CREATINE & ALBUMIN,URINE (Completed)   Ambulatory referral to Ophthalmology   Severe persistent asthma with acute exacerbation   (Chronic)     Relevant Orders   CMP14+EGFR   CBC with Differential/Platelet   Screening for malignant neoplasm of colon       Sending order for cologuard.   Relevant Orders   Cologuard   CMP14+EGFR   CBC with Differential/Platelet   Vitamin D deficiency, unspecified       Checking labs today.  Will continue supplements as needed.   Relevant Orders   VITAMIN D 25 Hydroxy (Vit-D Deficiency, Fractures)   CMP14+EGFR   CBC with Differential/Platelet   B12 deficiency due to diet       Checking labs today.  Will continue supplements as needed.   Relevant Orders   CMP14+EGFR   Vitamin B12   CBC with  Differential/Platelet       Return in about 3 months (around 01/23/2023) for CPE/PAP.   Total time spent: 30 minutes  Miki Kins, FNP  10/24/2022   This document may have been prepared by [facility] Voice Recognition software and as such may include unintentional dictation errors.

## 2022-10-24 NOTE — Assessment & Plan Note (Signed)
Checking labs today. Will call pt. With results  Continue current diabetes POC, as patient has been well controlled on current regimen.  Will adjust meds if needed based on labs.  

## 2022-10-24 NOTE — Assessment & Plan Note (Signed)
Continue current meds.  Will adjust as needed based on results.  The patient is asked to make an attempt to improve diet and exercise patterns to aid in medical management of this problem. Addressed importance of increasing and maintaining water intake.   

## 2022-10-24 NOTE — Assessment & Plan Note (Signed)
Blood pressure well controlled with current medications.  Continue current therapy.  Will reassess at follow up.  

## 2022-10-25 LAB — CMP14+EGFR
ALT: 15 IU/L (ref 0–32)
AST: 15 IU/L (ref 0–40)
Albumin: 4.1 g/dL (ref 3.9–4.9)
Alkaline Phosphatase: 80 IU/L (ref 44–121)
BUN/Creatinine Ratio: 17 (ref 12–28)
BUN: 14 mg/dL (ref 8–27)
Bilirubin Total: 0.4 mg/dL (ref 0.0–1.2)
CO2: 27 mmol/L (ref 20–29)
Calcium: 9.2 mg/dL (ref 8.7–10.3)
Chloride: 102 mmol/L (ref 96–106)
Creatinine, Ser: 0.84 mg/dL (ref 0.57–1.00)
Globulin, Total: 2.3 g/dL (ref 1.5–4.5)
Glucose: 116 mg/dL — ABNORMAL HIGH (ref 70–99)
Potassium: 4.3 mmol/L (ref 3.5–5.2)
Sodium: 143 mmol/L (ref 134–144)
Total Protein: 6.4 g/dL (ref 6.0–8.5)
eGFR: 78 mL/min/{1.73_m2} (ref >59)

## 2022-10-25 LAB — VITAMIN D 25 HYDROXY (VIT D DEFICIENCY, FRACTURES): Vit D, 25-Hydroxy: 44.3 ng/mL (ref 30.0–100.0)

## 2022-10-25 LAB — LIPID PANEL
Chol/HDL Ratio: 2.3 ratio (ref 0.0–4.4)
Cholesterol, Total: 168 mg/dL (ref 100–199)
HDL: 73 mg/dL (ref >39)
LDL Chol Calc (NIH): 76 mg/dL (ref 0–99)
Triglycerides: 105 mg/dL (ref 0–149)
VLDL Cholesterol Cal: 19 mg/dL (ref 5–40)

## 2022-10-25 LAB — HEMOGLOBIN A1C
Est. average glucose Bld gHb Est-mCnc: 134 mg/dL
Hgb A1c MFr Bld: 6.3 % — ABNORMAL HIGH (ref 4.8–5.6)

## 2022-10-25 LAB — VITAMIN B12: Vitamin B-12: 358 pg/mL (ref 232–1245)

## 2022-11-04 ENCOUNTER — Other Ambulatory Visit: Payer: Self-pay | Admitting: Family

## 2022-11-04 DIAGNOSIS — J4531 Mild persistent asthma with (acute) exacerbation: Secondary | ICD-10-CM

## 2022-11-26 DIAGNOSIS — Z1211 Encounter for screening for malignant neoplasm of colon: Secondary | ICD-10-CM | POA: Diagnosis not present

## 2022-12-01 LAB — COLOGUARD: COLOGUARD: NEGATIVE

## 2022-12-27 ENCOUNTER — Other Ambulatory Visit: Payer: Self-pay | Admitting: Family

## 2023-01-01 ENCOUNTER — Ambulatory Visit: Payer: 59 | Admitting: Oncology

## 2023-03-02 ENCOUNTER — Inpatient Hospital Stay: Payer: 59 | Admitting: Oncology

## 2023-03-07 ENCOUNTER — Other Ambulatory Visit: Payer: Self-pay | Admitting: Family

## 2023-03-23 ENCOUNTER — Ambulatory Visit: Payer: 59 | Admitting: Nurse Practitioner

## 2023-03-23 ENCOUNTER — Other Ambulatory Visit: Payer: Self-pay | Admitting: Nurse Practitioner

## 2023-03-23 ENCOUNTER — Encounter: Payer: Self-pay | Admitting: Nurse Practitioner

## 2023-03-23 VITALS — BP 100/70 | HR 66 | Temp 98.2°F | Ht 64.0 in | Wt 267.2 lb

## 2023-03-23 DIAGNOSIS — Z23 Encounter for immunization: Secondary | ICD-10-CM

## 2023-03-23 DIAGNOSIS — Z0000 Encounter for general adult medical examination without abnormal findings: Secondary | ICD-10-CM | POA: Diagnosis not present

## 2023-03-23 DIAGNOSIS — E782 Mixed hyperlipidemia: Secondary | ICD-10-CM | POA: Diagnosis not present

## 2023-03-23 DIAGNOSIS — Z87891 Personal history of nicotine dependence: Secondary | ICD-10-CM

## 2023-03-23 DIAGNOSIS — J4550 Severe persistent asthma, uncomplicated: Secondary | ICD-10-CM

## 2023-03-23 DIAGNOSIS — I10 Essential (primary) hypertension: Secondary | ICD-10-CM

## 2023-03-23 DIAGNOSIS — E119 Type 2 diabetes mellitus without complications: Secondary | ICD-10-CM

## 2023-03-23 DIAGNOSIS — Z170 Estrogen receptor positive status [ER+]: Secondary | ICD-10-CM

## 2023-03-23 DIAGNOSIS — C50911 Malignant neoplasm of unspecified site of right female breast: Secondary | ICD-10-CM

## 2023-03-23 DIAGNOSIS — Z1231 Encounter for screening mammogram for malignant neoplasm of breast: Secondary | ICD-10-CM | POA: Diagnosis not present

## 2023-03-23 DIAGNOSIS — F325 Major depressive disorder, single episode, in full remission: Secondary | ICD-10-CM | POA: Diagnosis not present

## 2023-03-23 LAB — POCT GLYCOSYLATED HEMOGLOBIN (HGB A1C): Hemoglobin A1C: 6.5 % — AB (ref 4.0–5.6)

## 2023-03-23 MED ORDER — OZEMPIC (0.25 OR 0.5 MG/DOSE) 2 MG/3ML ~~LOC~~ SOPN
0.2500 mg | PEN_INJECTOR | SUBCUTANEOUS | 0 refills | Status: DC
Start: 2023-03-23 — End: 2023-03-23

## 2023-03-23 NOTE — Assessment & Plan Note (Signed)
Patient currently maintained on citalopram 40 mg daily.  Tolerates medication well.  Patient denies SI/HI/AVH.  Continue medication as prescribed

## 2023-03-23 NOTE — Patient Instructions (Signed)
Nice to see you today Make a fasting lab appointment within the next 2 week I want to see you in 3 months, sooner if you need me  We did update your tetanus vaccine today

## 2023-03-23 NOTE — Assessment & Plan Note (Signed)
Discussed age-appropriate immunizations and screening exams.  Did review patient's personal, surgical, social, family histories.  Patient is up-to-date on all age-appropriate vaccines she would like.  Update tetanus vaccine today.  Patient is up-to-date on CRC screening.  Mammogram placed for breast cancer screening.  Patient is up-to-date on DEXA scan.  Patient states she is up-to-date on Pap smear.  Patient given information at discharge about preventative healthcare maintenance with anticipatory guidance.

## 2023-03-23 NOTE — Assessment & Plan Note (Signed)
Patient is followed by pulmonology.  She is currently on albuterol, budesonide, DuoNeb, Trelegy, Singulair, Fasenra.  Continue taking medication as prescribed follow-up with pulmonology as recommended.

## 2023-03-23 NOTE — Assessment & Plan Note (Signed)
Patient currently maintained on losartan-hydrochlorothiazide.  Patient is tolerating medication well.  Continue medication as prescribed blood pressure within normal limits today.

## 2023-03-23 NOTE — Assessment & Plan Note (Signed)
Patient currently maintained on diet and lifestyle modifications only.  A1c of 6.5% today.  Will start patient on Ozempic 0.25 mg once weekly and plan to titrate up to 0.5 mg weekly thereafter to aid patient in weight loss

## 2023-03-23 NOTE — Progress Notes (Signed)
New Patient Office Visit  Subjective    Patient ID: Nancy Patton, female    DOB: 1960/06/17  Age: 63 y.o. MRN: [id number]  CC:  Chief Complaint  Patient presents with   Establish Care    HPI Nancy Patton presents to establish care  HTN: on losartan-hydrochlorothiazide daily. Does not have a cuff at home   Asthma: Followed by pulmonology on trelegy, albuterol, budesonide, faserna, singular. Not having to use the albuterol and no PND. She is currently working with insurance to get back on faserna    DM2: been off medications for several months. States that she was not able to get a refill. She does avoid sweets and avoids breads. Does not check glucose at home. Does have supplies to check her sugar if needed  Mood: citalopram 40mg  daily. Feels like it does well with mood. Denies Hi/SI/AVH   Breast cancer: letrazole and followed by oncology, Dr. Smith Robert. Mammogram due in a couple months   for complete physical and follow up of chronic conditions.  Immunizations: -Tetanus: Completed in 2012, update today  -Influenza: 11/21/2022 -Shingles: Completed Shingrix series -Pneumonia: Completed 2020  Diet: Fair diet. She will have 2 meals a day. She will skip breakfast. Eat lunch at work and dinner at home. No snacker. She will drink coffee. Does not drink much water  Exercise: No regular exercise.  Eye exam: needs updating. History of cataract surgery. Was followed by Dr. Clydene Pugh Dental exam: Needs updating     Colonoscopy: Completed in 11/26/2022 cologuard negative  Lung Cancer Screening: NA  Pap smear: UTD per patient. Due next year 2026  Mammogram: 05/01/2022, order for [place]   DEXA: 07/26/2022  Sleep: goes to bed around 9 and will get up around 5a. Feels rested sometimes. No CPAP. Does not snore     Outpatient Encounter Medications as of 03/23/2023  Medication Sig   albuterol (VENTOLIN HFA) 108 (90 Base) MCG/ACT inhaler Inhale 2 puffs into the lungs  every 6 (six) hours as needed for wheezing or shortness of breath.   benralizumab (FASENRA PEN) 30 MG/ML prefilled autoinjector Inject into the skin.   budesonide (PULMICORT) 0.25 MG/2ML nebulizer solution 0.25 mg daily.   Cholecalciferol (VITAMIN D3) 1.25 MG (50000 UT) CAPS TAKE 1 CAPSULE BY MOUTH ONE TIME PER WEEK   citalopram (CELEXA) 40 MG tablet TAKE 1 TABLET BY MOUTH EVERY DAY   ibuprofen (ADVIL) 800 MG tablet TAKE 1 TABLET BY MOUTH EVERY 8 HOURS AS NEEDED   ipratropium-albuterol (DUONEB) 0.5-2.5 (3) MG/3ML SOLN INHALE 1 VIAL VIA NEBULIZER THREE TIMES DAILY AS NEEDED   letrozole (FEMARA) 2.5 MG tablet TAKE 1 TABLET BY MOUTH EVERY DAY   losartan-hydrochlorothiazide (HYZAAR) 50-12.5 MG tablet TAKE 1 TABLET BY MOUTH EVERY DAY   montelukast (SINGULAIR) 10 MG tablet TAKE 1 TABLET BY MOUTH EVERY DAY   Semaglutide,0.25 or 0.5MG /DOS, (OZEMPIC, 0.25 OR 0.5 MG/DOSE,) 2 MG/3ML SOPN Inject 0.25 mg into the skin once a week.   TRELEGY ELLIPTA 100-62.5-25 MCG/ACT AEPB Take 1 puff by mouth daily.   [DISCONTINUED] ondansetron (ZOFRAN-ODT) 4 MG disintegrating tablet Take 1 tablet (4 mg total) by mouth every 8 (eight) hours as needed for nausea or vomiting.   doxycycline (VIBRAMYCIN) 100 MG capsule Take 1 capsule (100 mg total) by mouth 2 (two) times daily. (Patient not taking: Reported on 03/23/2023)   [DISCONTINUED] acetaminophen (TYLENOL) 325 MG tablet Take 2 tablets (650 mg total) by mouth every 6 (six) hours as needed for mild pain (or Fever >/=  101).   [DISCONTINUED] Dulaglutide (TRULICITY) 1.5 MG/0.5ML SOPN Inject 1.5 mg into the skin once a week.   [DISCONTINUED] guaiFENesin (MUCINEX) 600 MG 12 hr tablet Take 1-2 tablets (600-1,200 mg total) by mouth 2 (two) times daily as needed.   [DISCONTINUED] pantoprazole (PROTONIX) 40 MG tablet Take 1 tablet (40 mg total) by mouth daily.   [DISCONTINUED] predniSONE (DELTASONE) 20 MG tablet Take 2 tablets (40 mg total) by mouth daily with breakfast.   No  facility-administered encounter medications on file as of 03/23/2023.    Past Medical History:  Diagnosis Date   Acute asthma exacerbation 12/18/2021   Acute respiratory distress 09/26/2014   Acute respiratory failure with hypoxia (HCC) 07/06/2021   Arthritis    Asthma    Asthma exacerbation 10/13/2021   Asthmatic bronchitis with acute exacerbation 09/26/2014   Asthmatic bronchitis with exacerbation 09/26/2014   Breast cancer (HCC)    Cancer of right breast, stage 1, estrogen receptor positive (HCC) 05/15/2019   Formatting of this note might be different from the original. Last Assessment & Plan: Formatting of this note is different from the original. - Resumed home letrozole 2.5 mg daily   CAP (community acquired pneumonia) 07/06/2021   COVID-19 virus infection 03/27/2022   Diabetes mellitus without complication (HCC)    History of breast cancer 11/06/2020   Hypertension    Hypokalemia 10/14/2021   Multifocal pneumonia 05/13/2021   Obesity    Personal history of radiation therapy    Pulmonary hypertension (HCC)    Sepsis (HCC) 07/05/2021   Sleep apnea     Past Surgical History:  Procedure Laterality Date   BREAST LUMPECTOMY Right 05/23/2019   invasive DCIS radation    CATARACT EXTRACTION     CESAREAN SECTION     x 3     Family History  Problem Relation Age of Onset   Diabetes Mother    Stroke Mother    Diabetes Father    Diabetes Brother    Cancer Neg Hx    Heart failure Neg Hx    Hypertension Neg Hx    Breast cancer Neg Hx     Social History   Socioeconomic History   Marital status: Legally Separated    Spouse name: Not on file   Number of children: 3   Years of education: Not on file   Highest education level: Some college, no degree  Occupational History   Not on file  Tobacco Use   Smoking status: Former    Average packs/day: 0.5 packs/day for 10.0 years (5.0 ttl pk-yrs)    Types: Cigarettes    Start date: 1982   Smokeless tobacco: Never    Tobacco comments:    Quit smoking 30 years ago.  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      Merton Border III (31)   Layla (28)   Miles (24)   Social Drivers of Health   Financial Resource Strain: Low Risk  (03/22/2023)   Overall Financial Resource Strain (CARDIA)    Difficulty of Paying Living Expenses: Not hard at all  Food Insecurity: No Food Insecurity (03/22/2023)   Hunger Vital Sign    Worried About Running Out of Food in the Last Year: Never true  Ran Out of Food in the Last Year: Never true  Transportation Needs: No Transportation Needs (03/22/2023)   PRAPARE - Administrator, Civil Service (Medical): No    Lack of Transportation (Non-Medical): No  Physical Activity: Unknown (03/22/2023)   Exercise Vital Sign    Days of Exercise per Week: 0 days    Minutes of Exercise per Session: Not on file  Stress: No Stress Concern Present (03/22/2023)   [employer] of Occupational Health - Occupational Stress Questionnaire    Feeling of Stress : Only a little  Social Connections: Moderately Isolated (03/22/2023)   Social Connection and Isolation Panel [NHANES]    Frequency of Communication with Friends and Family: More than three times a week    Frequency of Social Gatherings with Friends and Family: More than three times a week    Attends Religious Services: 1 to 4 times per year    Active Member of [employer] or Organizations: No    Attends Banker Meetings: Not on file    Marital Status: Separated  Intimate Partner Violence: Not At Risk (03/27/2022)   Humiliation, Afraid, Rape, and Kick questionnaire    Fear of Current or Ex-Partner: No    Emotionally Abused: No    Physically Abused: No    Sexually Abused: No    Review of Systems   Constitutional:  Negative for chills and fever.  Respiratory:  Negative for shortness of breath.   Cardiovascular:  Negative for chest pain and leg swelling.  Gastrointestinal:  Negative for abdominal pain, blood in stool, constipation, diarrhea, nausea and vomiting.       BM daily   Genitourinary:  Negative for dysuria and hematuria.  Neurological:  Positive for tingling (fingers bilateral hands that resolve). Negative for headaches.  Psychiatric/Behavioral:  Negative for hallucinations and suicidal ideas.         Objective    BP 100/70   Pulse 66   Temp 98.2 F (36.8 C) (Oral)   Ht 5\' 4"  (1.626 m)   Wt 267 lb 3.2 oz (121.2 kg)   SpO2 95%   BMI 45.86 kg/m   Physical Exam Vitals and nursing note reviewed.  Constitutional:      Appearance: Normal appearance. She is obese.  HENT:     Right Ear: Tympanic membrane, ear canal and external ear normal.     Left Ear: Tympanic membrane, ear canal and external ear normal.     Mouth/Throat:     Mouth: Mucous membranes are moist.     Pharynx: Oropharynx is clear.  Eyes:     Extraocular Movements: Extraocular movements intact.     Pupils: Pupils are equal, round, and reactive to light.  Cardiovascular:     Rate and Rhythm: Normal rate and regular rhythm.     Pulses: Normal pulses.     Heart sounds: Normal heart sounds.  Pulmonary:     Effort: Pulmonary effort is normal.     Breath sounds: Normal breath sounds.  Abdominal:     General: Bowel sounds are normal. There is no distension.     Palpations: There is no mass.     Tenderness: There is no abdominal tenderness.     Hernia: No hernia is present.  Musculoskeletal:     Right lower leg: No edema.     Left lower leg: No edema.  Lymphadenopathy:     Cervical: No cervical adenopathy.  Skin:    General: Skin is warm.  Neurological:  General: No focal deficit present.     Mental Status: She is alert.     Deep Tendon Reflexes:     Reflex Scores:      Bicep reflexes are  2+ on the right side and 2+ on the left side.      Patellar reflexes are 2+ on the right side and 2+ on the left side.    Comments: Bilateral upper and lower extremity strength 5/5  Psychiatric:        Mood and Affect: Mood normal.        Behavior: Behavior normal.        Thought Content: Thought content normal.        Judgment: Judgment normal.    Title   Diabetic Foot Exam - detailed Date & Time: 03/23/2023  2:34 PM Diabetic Foot exam was performed with the following findings: Yes  Is there a history of foot ulcer?: No Is there a foot ulcer now?: No Is there swelling?: No Is there elevated skin temperature?: No Is there abnormal foot shape?: No Is there a claw toe deformity?: No Are the toenails long?: No Are the toenails thick?: No Are the toenails ingrown?: No Pulse Foot Exam completed.: Yes   Right Posterior Tibialis: Present    Right Dorsalis Pedis: Present Left Dorsalis Pedis: Present     Sensory Foot Exam Completed.: Yes Semmes-Weinstein Monofilament Test "+" means "has sensation" and "-" means "no sensation"      Image components are not supported.   Image components are not supported. Image components are not supported.  Tuning Fork Comments All 10 sites sensation intact bilaterally         Assessment & Plan:   Problem List Items Addressed This Visit       Cardiovascular and Mediastinum   Essential hypertension   Patient currently maintained on losartan-hydrochlorothiazide.  Patient is tolerating medication well.  Continue medication as prescribed blood pressure within normal limits today.      Relevant Orders   CBC   Comprehensive metabolic panel   Lipid panel   TSH     Respiratory   Severe persistent asthma, uncomplicated   Patient is followed by pulmonology.  She is currently on albuterol, budesonide, DuoNeb, Trelegy, Singulair, Fasenra.  Continue taking medication as prescribed follow-up with pulmonology as recommended.      Relevant  Medications   benralizumab (FASENRA PEN) 30 MG/ML prefilled autoinjector     Endocrine   Controlled type 2 diabetes mellitus without complication, without long-term current use of insulin (HCC)   Patient currently maintained on diet and lifestyle modifications only.  A1c of 6.5% today.  Will start patient on Ozempic 0.25 mg once weekly and plan to titrate up to 0.5 mg weekly thereafter to aid patient in weight loss      Relevant Medications   Semaglutide,0.25 or 0.5MG /DOS, (OZEMPIC, 0.25 OR 0.5 MG/DOSE,) 2 MG/3ML SOPN   Other Relevant Orders   POCT glycosylated hemoglobin (Hb A1C) (Completed)   CBC   Comprehensive metabolic panel   Lipid panel   Microalbumin / creatinine urine ratio     Other   Depression, unspecified   Patient currently maintained on citalopram 40 mg daily.  Tolerates medication well.  Patient denies SI/HI/AVH.  Continue medication as prescribed      Cancer of right breast, stage 1, estrogen receptor positive (HCC)   Patient currently on letrozole and followed by oncology.  Mammogram order placed she will be due in 2 months.  Continue  follow with oncology as recommended continue taking medication as prescribed      Mixed hyperlipidemia   Pending fasting lipid panel.  Continue working on healthy lifestyle modifications      Preventative health care - Primary   Discussed age-appropriate immunizations and screening exams.  Did review patient's personal, surgical, social, family histories.  Patient is up-to-date on all age-appropriate vaccines she would like.  Update tetanus vaccine today.  Patient is up-to-date on CRC screening.  Mammogram placed for breast cancer screening.  Patient is up-to-date on DEXA scan.  Patient states she is up-to-date on Pap smear.  Patient given information at discharge about preventative healthcare maintenance with anticipatory guidance.      Other Visit Diagnoses       Morbid obesity (HCC)       Relevant Medications    Semaglutide,0.25 or 0.5MG /DOS, (OZEMPIC, 0.25 OR 0.5 MG/DOSE,) 2 MG/3ML SOPN     Former tobacco use       Relevant Orders   Urine Microscopic     Need for diphtheria-tetanus-pertussis (Tdap) vaccine       Relevant Orders   Tdap vaccine greater than or equal to 7yo IM (Completed)     Screening mammogram for breast cancer       Relevant Orders   MM 3D SCREENING MAMMOGRAM BILATERAL BREAST       No follow-ups on file.   Audria Nine, NP

## 2023-03-23 NOTE — Assessment & Plan Note (Signed)
Patient currently on letrozole and followed by oncology.  Mammogram order placed she will be due in 2 months.  Continue follow with oncology as recommended continue taking medication as prescribed

## 2023-03-23 NOTE — Assessment & Plan Note (Signed)
Pending fasting lipid panel.  Continue working on healthy lifestyle modifications

## 2023-03-24 LAB — URINALYSIS, MICROSCOPIC ONLY
Bacteria, UA: NONE SEEN /[HPF]
Hyaline Cast: NONE SEEN /[LPF]
RBC / HPF: NONE SEEN /[HPF] (ref 0–2)
Squamous Epithelial / HPF: NONE SEEN /[HPF] (ref <=5)

## 2023-03-24 LAB — MICROALBUMIN / CREATININE URINE RATIO
Creatinine, Urine: 118 mg/dL (ref 20–275)
Microalb Creat Ratio: 3 mg/g{creat} (ref <30)
Microalb, Ur: 0.4 mg/dL

## 2023-03-28 ENCOUNTER — Encounter: Payer: Self-pay | Admitting: Nurse Practitioner

## 2023-04-02 ENCOUNTER — Other Ambulatory Visit (INDEPENDENT_AMBULATORY_CARE_PROVIDER_SITE_OTHER): Payer: 59

## 2023-04-02 DIAGNOSIS — E119 Type 2 diabetes mellitus without complications: Secondary | ICD-10-CM | POA: Diagnosis not present

## 2023-04-02 DIAGNOSIS — I10 Essential (primary) hypertension: Secondary | ICD-10-CM

## 2023-04-02 LAB — CBC
HCT: 37.6 % (ref 36.0–46.0)
Hemoglobin: 12.3 g/dL (ref 12.0–15.0)
MCHC: 32.7 g/dL (ref 30.0–36.0)
MCV: 87.2 fL (ref 78.0–100.0)
Platelets: 209 10*3/uL (ref 150.0–400.0)
RBC: 4.31 Mil/uL (ref 3.87–5.11)
RDW: 13.9 % (ref 11.5–15.5)
WBC: 4.8 10*3/uL (ref 4.0–10.5)

## 2023-04-02 LAB — COMPREHENSIVE METABOLIC PANEL
ALT: 14 U/L (ref 0–35)
AST: 13 U/L (ref 0–37)
Albumin: 3.9 g/dL (ref 3.5–5.2)
Alkaline Phosphatase: 61 U/L (ref 39–117)
BUN: 22 mg/dL (ref 6–23)
CO2: 30 meq/L (ref 19–32)
Calcium: 8.7 mg/dL (ref 8.4–10.5)
Chloride: 104 meq/L (ref 96–112)
Creatinine, Ser: 0.92 mg/dL (ref 0.40–1.20)
GFR: 66.27 mL/min (ref >60.00)
Glucose, Bld: 143 mg/dL — ABNORMAL HIGH (ref 70–99)
Potassium: 4.5 meq/L (ref 3.5–5.1)
Sodium: 143 meq/L (ref 135–145)
Total Bilirubin: 0.4 mg/dL (ref 0.2–1.2)
Total Protein: 6 g/dL (ref 6.0–8.3)

## 2023-04-02 LAB — LIPID PANEL
Cholesterol: 154 mg/dL (ref 0–200)
HDL: 65.8 mg/dL (ref >39.00)
LDL Cholesterol: 69 mg/dL (ref 0–99)
NonHDL: 88.12
Total CHOL/HDL Ratio: 2
Triglycerides: 96 mg/dL (ref 0.0–149.0)
VLDL: 19.2 mg/dL (ref 0.0–40.0)

## 2023-04-02 LAB — TSH: TSH: 2.67 u[IU]/mL (ref 0.35–5.50)

## 2023-04-04 ENCOUNTER — Encounter: Payer: Self-pay | Admitting: Nurse Practitioner

## 2023-04-05 ENCOUNTER — Encounter: Payer: Self-pay | Admitting: Internal Medicine

## 2023-04-05 ENCOUNTER — Ambulatory Visit: Payer: 59 | Admitting: Internal Medicine

## 2023-04-05 VITALS — BP 110/80 | HR 86 | Temp 99.8°F | Ht 64.0 in | Wt 268.0 lb

## 2023-04-05 DIAGNOSIS — R051 Acute cough: Secondary | ICD-10-CM | POA: Diagnosis not present

## 2023-04-05 DIAGNOSIS — J101 Influenza due to other identified influenza virus with other respiratory manifestations: Secondary | ICD-10-CM

## 2023-04-05 LAB — POCT INFLUENZA A/B
Influenza A, POC: POSITIVE — AB
Influenza B, POC: NEGATIVE

## 2023-04-05 MED ORDER — PREDNISONE 20 MG PO TABS: 40.0000 mg | ORAL_TABLET | Freq: Every day | ORAL | 0 refills | Status: DC

## 2023-04-05 MED ORDER — OSELTAMIVIR PHOSPHATE 75 MG PO CAPS
75.0000 mg | ORAL_CAPSULE | Freq: Two times a day (BID) | ORAL | 0 refills | Status: DC
Start: 2023-04-05 — End: 2023-06-22

## 2023-04-05 NOTE — Assessment & Plan Note (Signed)
Less than 48 hours out and with mild exacerbation of persistent asthma Analgesics, anti-tussives Tamiflu 75 bid x 5 days Prednisone 40 x 3, 20 x 3 Out of work till 2/17

## 2023-04-05 NOTE — Progress Notes (Signed)
Subjective:    Patient ID: Nancy Patton, female    DOB: 1961/01/28, 63 y.o.   MRN: [id number]  HPI Here due to respiratory infection  Started 2 nights ago--felt bad Still went to work yesterday--but couldn't stay Aching, bad cough, stuffy nose, tearing eyes Exhausted and left---set up appointment  Coughed all night Cold--then hot No clear fever Some SOB---wheezing and chest congestion  Current Outpatient Medications on File Prior to Visit  Medication Sig Dispense Refill   albuterol (VENTOLIN HFA) 108 (90 Base) MCG/ACT inhaler Inhale 2 puffs into the lungs every 6 (six) hours as needed for wheezing or shortness of breath. 18 g 0   benralizumab (FASENRA PEN) 30 MG/ML prefilled autoinjector Inject into the skin.     budesonide (PULMICORT) 0.25 MG/2ML nebulizer solution 0.25 mg daily.     Cholecalciferol (VITAMIN D3) 1.25 MG (50000 UT) CAPS TAKE 1 CAPSULE BY MOUTH ONE TIME PER WEEK 12 capsule 3   citalopram (CELEXA) 40 MG tablet TAKE 1 TABLET BY MOUTH EVERY DAY 30 tablet 6   doxycycline (VIBRAMYCIN) 100 MG capsule Take 1 capsule (100 mg total) by mouth 2 (two) times daily. 14 capsule 0   Dulaglutide (TRULICITY) 0.75 MG/0.5ML SOAJ Inject 0.75 mg into the skin once a week. 2 mL 0   ibuprofen (ADVIL) 800 MG tablet TAKE 1 TABLET BY MOUTH EVERY 8 HOURS AS NEEDED 90 tablet 1   ipratropium-albuterol (DUONEB) 0.5-2.5 (3) MG/3ML SOLN INHALE 1 VIAL VIA NEBULIZER THREE TIMES DAILY AS NEEDED 270 mL 1   letrozole (FEMARA) 2.5 MG tablet TAKE 1 TABLET BY MOUTH EVERY DAY 90 tablet 3   losartan-hydrochlorothiazide (HYZAAR) 50-12.5 MG tablet TAKE 1 TABLET BY MOUTH EVERY DAY 30 tablet 5   montelukast (SINGULAIR) 10 MG tablet TAKE 1 TABLET BY MOUTH EVERY DAY 30 tablet 8   TRELEGY ELLIPTA 100-62.5-25 MCG/ACT AEPB Take 1 puff by mouth daily. 60 each 0   No current facility-administered medications on file prior to visit.    No Known Allergies  Past Medical History:  Diagnosis Date   Acute  asthma exacerbation 12/18/2021   Acute respiratory distress 09/26/2014   Acute respiratory failure with hypoxia (HCC) 07/06/2021   Arthritis    Asthma    Asthma exacerbation 10/13/2021   Asthmatic bronchitis with acute exacerbation 09/26/2014   Asthmatic bronchitis with exacerbation 09/26/2014   Breast cancer (HCC)    Cancer of right breast, stage 1, estrogen receptor positive (HCC) 05/15/2019   Formatting of this note might be different from the original. Last Assessment & Plan: Formatting of this note is different from the original. - Resumed home letrozole 2.5 mg daily   CAP (community acquired pneumonia) 07/06/2021   COVID-19 virus infection 03/27/2022   Diabetes mellitus without complication (HCC)    History of breast cancer 11/06/2020   Hypertension    Hypokalemia 10/14/2021   Multifocal pneumonia 05/13/2021   Obesity    Personal history of radiation therapy    Pulmonary hypertension (HCC)    Sepsis (HCC) 07/05/2021   Sleep apnea     Past Surgical History:  Procedure Laterality Date   BREAST LUMPECTOMY Right 05/23/2019   invasive DCIS radation    CATARACT EXTRACTION     CESAREAN SECTION     x 3     Family History  Problem Relation Age of Onset   Diabetes Mother    Stroke Mother    Diabetes Father    Diabetes Brother    Cancer Neg Hx  Heart failure Neg Hx    Hypertension Neg Hx    Breast cancer Neg Hx     Social History   Socioeconomic History   Marital status: Legally Separated    Spouse name: Not on file   Number of children: 3   Years of education: Not on file   Highest education level: Some college, no degree  Occupational History   Not on file  Tobacco Use   Smoking status: Former    Average packs/day: 0.5 packs/day for 10.0 years (5.0 ttl pk-yrs)    Types: Cigarettes    Start date: 1982   Smokeless tobacco: Never   Tobacco comments:    Quit smoking 30 years ago.  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      Merton Border III (31)   Layla (28)   Miles (24)   Social Drivers of Health   Financial Resource Strain: Low Risk  (03/22/2023)   Overall Financial Resource Strain (CARDIA)    Difficulty of Paying Living Expenses: Not hard at all  Food Insecurity: No Food Insecurity (03/22/2023)   Hunger Vital Sign    Worried About Running Out of Food in the Last Year: Never true    Ran Out of Food in the Last Year: Never true  Transportation Needs: No Transportation Needs (03/22/2023)   PRAPARE - Administrator, Civil Service (Medical): No    Lack of Transportation (Non-Medical): No  Physical Activity: Unknown (03/22/2023)   Exercise Vital Sign    Days of Exercise per Week: 0 days    Minutes of Exercise per Session: Not on file  Stress: No Stress Concern Present (03/22/2023)   [employer] of Occupational Health - Occupational Stress Questionnaire    Feeling of Stress : Only a little  Social Connections: Moderately Isolated (03/22/2023)   Social Connection and Isolation Panel [NHANES]    Frequency of Communication with Friends and Family: More than three times a week    Frequency of Social Gatherings with Friends and Family: More than three times a week    Attends Religious Services: 1 to 4 times per year    Active Member of [employer] or Organizations: No    Attends Engineer, structural: Not on file    Marital Status: Separated  Intimate Partner Violence: Not At Risk (03/27/2022)   Humiliation, Afraid, Rape, and Kick questionnaire    Fear of Current or Ex-Partner: No    Emotionally Abused: No    Physically Abused: No    Sexually Abused: No   Review of Systems No N/V Is able to eat--but not much appetite    Objective:   Physical Exam Constitutional:      General: She is not in acute  distress.    Appearance: Normal appearance.  HENT:     Head:     Comments: Slight frontal tenderness    Right Ear: Tympanic membrane and ear canal normal.     Left Ear: Tympanic membrane and ear canal normal.     Mouth/Throat:     Pharynx: No oropharyngeal exudate or posterior oropharyngeal erythema.  Pulmonary:     Effort: Pulmonary effort is normal.     Comments: Slightly decreased breath sounds and slight  exp phase prolongation Insp/exp rhonchi Neurological:     Mental Status: She is alert.            Assessment & Plan:

## 2023-04-06 ENCOUNTER — Other Ambulatory Visit: Payer: 59

## 2023-04-07 ENCOUNTER — Other Ambulatory Visit: Payer: Self-pay

## 2023-04-07 ENCOUNTER — Inpatient Hospital Stay
Admission: EM | Admit: 2023-04-07 | Discharge: 2023-04-10 | DRG: 193 | Disposition: A | Discharge status: Home / Self Care | Payer: 59 | Attending: Internal Medicine | Admitting: Internal Medicine

## 2023-04-07 ENCOUNTER — Emergency Department: Payer: 59

## 2023-04-07 ENCOUNTER — Inpatient Hospital Stay: Payer: 59

## 2023-04-07 DIAGNOSIS — J45901 Unspecified asthma with (acute) exacerbation: Secondary | ICD-10-CM | POA: Diagnosis not present

## 2023-04-07 DIAGNOSIS — Z79811 Long term (current) use of aromatase inhibitors: Secondary | ICD-10-CM

## 2023-04-07 DIAGNOSIS — Z7985 Long-term (current) use of injectable non-insulin antidiabetic drugs: Secondary | ICD-10-CM | POA: Diagnosis not present

## 2023-04-07 DIAGNOSIS — J189 Pneumonia, unspecified organism: Secondary | ICD-10-CM

## 2023-04-07 DIAGNOSIS — Z923 Personal history of irradiation: Secondary | ICD-10-CM | POA: Diagnosis not present

## 2023-04-07 DIAGNOSIS — R0602 Shortness of breath: Secondary | ICD-10-CM | POA: Diagnosis not present

## 2023-04-07 DIAGNOSIS — J9811 Atelectasis: Secondary | ICD-10-CM | POA: Diagnosis not present

## 2023-04-07 DIAGNOSIS — E876 Hypokalemia: Secondary | ICD-10-CM | POA: Diagnosis present

## 2023-04-07 DIAGNOSIS — Z1152 Encounter for screening for COVID-19: Secondary | ICD-10-CM | POA: Diagnosis not present

## 2023-04-07 DIAGNOSIS — J111 Influenza due to unidentified influenza virus with other respiratory manifestations: Secondary | ICD-10-CM | POA: Diagnosis not present

## 2023-04-07 DIAGNOSIS — C50911 Malignant neoplasm of unspecified site of right female breast: Secondary | ICD-10-CM | POA: Diagnosis present

## 2023-04-07 DIAGNOSIS — I7121 Aneurysm of the ascending aorta, without rupture: Secondary | ICD-10-CM | POA: Diagnosis not present

## 2023-04-07 DIAGNOSIS — J1000 Influenza due to other identified influenza virus with unspecified type of pneumonia: Principal | ICD-10-CM | POA: Diagnosis present

## 2023-04-07 DIAGNOSIS — J4550 Severe persistent asthma, uncomplicated: Secondary | ICD-10-CM | POA: Diagnosis not present

## 2023-04-07 DIAGNOSIS — E785 Hyperlipidemia, unspecified: Secondary | ICD-10-CM | POA: Diagnosis not present

## 2023-04-07 DIAGNOSIS — Z7952 Long term (current) use of systemic steroids: Secondary | ICD-10-CM | POA: Diagnosis not present

## 2023-04-07 DIAGNOSIS — T17590A Other foreign object in bronchus causing asphyxiation, initial encounter: Secondary | ICD-10-CM | POA: Diagnosis not present

## 2023-04-07 DIAGNOSIS — Z87891 Personal history of nicotine dependence: Secondary | ICD-10-CM | POA: Diagnosis not present

## 2023-04-07 DIAGNOSIS — Z7951 Long term (current) use of inhaled steroids: Secondary | ICD-10-CM | POA: Diagnosis not present

## 2023-04-07 DIAGNOSIS — I10 Essential (primary) hypertension: Secondary | ICD-10-CM | POA: Diagnosis present

## 2023-04-07 DIAGNOSIS — J4551 Severe persistent asthma with (acute) exacerbation: Secondary | ICD-10-CM | POA: Diagnosis present

## 2023-04-07 DIAGNOSIS — J449 Chronic obstructive pulmonary disease, unspecified: Secondary | ICD-10-CM | POA: Diagnosis not present

## 2023-04-07 DIAGNOSIS — G4730 Sleep apnea, unspecified: Secondary | ICD-10-CM | POA: Diagnosis not present

## 2023-04-07 DIAGNOSIS — Z6841 Body Mass Index (BMI) 40.0 and over, adult: Secondary | ICD-10-CM | POA: Diagnosis not present

## 2023-04-07 DIAGNOSIS — J101 Influenza due to other identified influenza virus with other respiratory manifestations: Secondary | ICD-10-CM | POA: Diagnosis not present

## 2023-04-07 DIAGNOSIS — E1169 Type 2 diabetes mellitus with other specified complication: Secondary | ICD-10-CM | POA: Diagnosis not present

## 2023-04-07 DIAGNOSIS — J45909 Unspecified asthma, uncomplicated: Secondary | ICD-10-CM | POA: Diagnosis not present

## 2023-04-07 DIAGNOSIS — Z833 Family history of diabetes mellitus: Secondary | ICD-10-CM | POA: Diagnosis not present

## 2023-04-07 DIAGNOSIS — Z170 Estrogen receptor positive status [ER+]: Secondary | ICD-10-CM | POA: Diagnosis not present

## 2023-04-07 DIAGNOSIS — Z8616 Personal history of COVID-19: Secondary | ICD-10-CM | POA: Diagnosis not present

## 2023-04-07 DIAGNOSIS — J9601 Acute respiratory failure with hypoxia: Principal | ICD-10-CM

## 2023-04-07 DIAGNOSIS — N179 Acute kidney failure, unspecified: Secondary | ICD-10-CM | POA: Diagnosis not present

## 2023-04-07 DIAGNOSIS — R918 Other nonspecific abnormal finding of lung field: Secondary | ICD-10-CM | POA: Diagnosis not present

## 2023-04-07 DIAGNOSIS — E119 Type 2 diabetes mellitus without complications: Secondary | ICD-10-CM | POA: Diagnosis present

## 2023-04-07 LAB — CBC
HCT: 39.5 % (ref 36.0–46.0)
Hemoglobin: 12.6 g/dL (ref 12.0–15.0)
MCH: 28.3 pg (ref 26.0–34.0)
MCHC: 31.9 g/dL (ref 30.0–36.0)
MCV: 88.6 fL (ref 80.0–100.0)
Platelets: 186 10*3/uL (ref 150–400)
RBC: 4.46 MIL/uL (ref 3.87–5.11)
RDW: 13.2 % (ref 11.5–15.5)
WBC: 6.6 10*3/uL (ref 4.0–10.5)
nRBC: 0 % (ref 0.0–0.2)

## 2023-04-07 LAB — BASIC METABOLIC PANEL
Anion gap: 10 (ref 5–15)
BUN: 15 mg/dL (ref 8–23)
CO2: 24 mmol/L (ref 22–32)
Calcium: 8.6 mg/dL — ABNORMAL LOW (ref 8.9–10.3)
Chloride: 104 mmol/L (ref 98–111)
Creatinine, Ser: 1.12 mg/dL — ABNORMAL HIGH (ref 0.44–1.00)
GFR, Estimated: 55 mL/min — ABNORMAL LOW (ref >60)
Glucose, Bld: 260 mg/dL — ABNORMAL HIGH (ref 70–99)
Potassium: 4 mmol/L (ref 3.5–5.1)
Sodium: 138 mmol/L (ref 135–145)

## 2023-04-07 LAB — RESP PANEL BY RT-PCR (RSV, FLU A&B, COVID)  RVPGX2
Influenza A by PCR: POSITIVE — AB
Influenza B by PCR: NEGATIVE
Resp Syncytial Virus by PCR: NEGATIVE
SARS Coronavirus 2 by RT PCR: NEGATIVE

## 2023-04-07 LAB — MRSA NEXT GEN BY PCR, NASAL: MRSA by PCR Next Gen: NOT DETECTED

## 2023-04-07 LAB — GLUCOSE, CAPILLARY: Glucose-Capillary: 342 mg/dL — ABNORMAL HIGH (ref 70–99)

## 2023-04-07 LAB — TROPONIN I (HIGH SENSITIVITY): Troponin I (High Sensitivity): 5 ng/L (ref <18)

## 2023-04-07 LAB — CBG MONITORING, ED: Glucose-Capillary: 220 mg/dL — ABNORMAL HIGH (ref 70–99)

## 2023-04-07 MED ORDER — ONDANSETRON HCL 4 MG PO TABS: 4.0000 mg | ORAL_TABLET | Freq: Four times a day (QID) | ORAL | Status: DC | PRN

## 2023-04-07 MED ORDER — INSULIN ASPART 100 UNIT/ML IJ SOLN
0.0000 [IU] | Freq: Three times a day (TID) | INTRAMUSCULAR | Status: DC
  Administered 2023-04-07: 11 [IU] via SUBCUTANEOUS
  Administered 2023-04-08: 3 [IU] via SUBCUTANEOUS
  Administered 2023-04-08: 5 [IU] via SUBCUTANEOUS
  Administered 2023-04-08: 3 [IU] via SUBCUTANEOUS
  Administered 2023-04-08 – 2023-04-09 (×2): 2 [IU] via SUBCUTANEOUS
  Administered 2023-04-09: 3 [IU] via SUBCUTANEOUS
  Administered 2023-04-09: 2 [IU] via SUBCUTANEOUS
  Administered 2023-04-10: 11 [IU] via SUBCUTANEOUS
  Administered 2023-04-10: 3 [IU] via SUBCUTANEOUS
  Filled 2023-04-07 (×10): qty 1

## 2023-04-07 MED ORDER — LOSARTAN POTASSIUM 50 MG PO TABS
50.0000 mg | ORAL_TABLET | Freq: Every day | ORAL | Status: DC
  Administered 2023-04-08 – 2023-04-10 (×3): 50 mg via ORAL
  Filled 2023-04-07 (×3): qty 1

## 2023-04-07 MED ORDER — INSULIN ASPART 100 UNIT/ML IJ SOLN
0.0000 [IU] | Freq: Three times a day (TID) | INTRAMUSCULAR | Status: DC
Start: 2023-04-07 — End: 2023-04-07
  Administered 2023-04-07: 5 [IU] via SUBCUTANEOUS
  Filled 2023-04-07: qty 1

## 2023-04-07 MED ORDER — ONDANSETRON HCL 4 MG/2ML IJ SOLN: 4.0000 mg | Freq: Four times a day (QID) | INTRAMUSCULAR | Status: DC | PRN

## 2023-04-07 MED ORDER — IPRATROPIUM-ALBUTEROL 0.5-2.5 (3) MG/3ML IN SOLN
3.0000 mL | Freq: Once | RESPIRATORY_TRACT | Status: AC
  Administered 2023-04-07: 3 mL via RESPIRATORY_TRACT
  Filled 2023-04-07: qty 3

## 2023-04-07 MED ORDER — METHYLPREDNISOLONE SODIUM SUCC 125 MG IJ SOLR
125.0000 mg | Freq: Once | INTRAMUSCULAR | Status: AC
  Administered 2023-04-07: 125 mg via INTRAVENOUS
  Filled 2023-04-07: qty 2

## 2023-04-07 MED ORDER — OSELTAMIVIR PHOSPHATE 75 MG PO CAPS
75.0000 mg | ORAL_CAPSULE | Freq: Two times a day (BID) | ORAL | Status: AC
  Administered 2023-04-07 – 2023-04-09 (×6): 75 mg via ORAL
  Filled 2023-04-07 (×7): qty 1

## 2023-04-07 MED ORDER — INSULIN ASPART 100 UNIT/ML IJ SOLN: 0.0000 [IU] | Freq: Three times a day (TID) | INTRAMUSCULAR | Status: DC

## 2023-04-07 MED ORDER — ENOXAPARIN SODIUM 60 MG/0.6ML IJ SOSY
0.5000 mg/kg | PREFILLED_SYRINGE | INTRAMUSCULAR | Status: DC
  Administered 2023-04-07 – 2023-04-09 (×3): 60 mg via SUBCUTANEOUS
  Filled 2023-04-07 (×3): qty 0.6

## 2023-04-07 MED ORDER — SODIUM CHLORIDE 0.9 % IV SOLN
2.0000 g | INTRAVENOUS | Status: DC
  Administered 2023-04-07 – 2023-04-08 (×2): 2 g via INTRAVENOUS
  Filled 2023-04-07 (×2): qty 20

## 2023-04-07 MED ORDER — LOSARTAN POTASSIUM-HCTZ 50-12.5 MG PO TABS: 1.0000 | ORAL_TABLET | Freq: Every day | ORAL | Status: DC

## 2023-04-07 MED ORDER — SODIUM CHLORIDE 0.9 % IV SOLN
500.0000 mg | INTRAVENOUS | Status: DC
  Administered 2023-04-07: 500 mg via INTRAVENOUS
  Filled 2023-04-07: qty 5

## 2023-04-07 MED ORDER — IPRATROPIUM-ALBUTEROL 0.5-2.5 (3) MG/3ML IN SOLN: 3.0000 mL | RESPIRATORY_TRACT | Status: DC | PRN

## 2023-04-07 MED ORDER — SODIUM CHLORIDE 0.9 % IV SOLN: INTRAVENOUS | Status: AC

## 2023-04-07 MED ORDER — PREDNISONE 20 MG PO TABS
50.0000 mg | ORAL_TABLET | Freq: Every day | ORAL | Status: DC
  Administered 2023-04-08: 50 mg via ORAL
  Filled 2023-04-07: qty 1

## 2023-04-07 MED ORDER — HYDROCHLOROTHIAZIDE 12.5 MG PO TABS
12.5000 mg | ORAL_TABLET | Freq: Every day | ORAL | Status: DC
  Administered 2023-04-08 – 2023-04-10 (×3): 12.5 mg via ORAL
  Filled 2023-04-07 (×3): qty 1

## 2023-04-07 MED ORDER — INSULIN ASPART 100 UNIT/ML IJ SOLN
4.0000 [IU] | Freq: Three times a day (TID) | INTRAMUSCULAR | Status: DC
  Administered 2023-04-07 – 2023-04-10 (×8): 4 [IU] via SUBCUTANEOUS
  Filled 2023-04-07 (×9): qty 1

## 2023-04-07 NOTE — ED Notes (Signed)
Patient ambulated across the hallway to the bathroom without the oxygen and was dyspneic upon return. Patient placed the O 2 on herself and was 95% on 2L.

## 2023-04-07 NOTE — ED Notes (Signed)
Patient states she feels mild relief of dyspnea from first two neb treatments.

## 2023-04-07 NOTE — ED Notes (Signed)
Pt taken by CT at this time.

## 2023-04-07 NOTE — Progress Notes (Signed)
Approximately 1820--Pt arrived to room 207A from ED. Upon arrival, pt A&O x 4 and VS obtained. Full assessment completed by this RN.

## 2023-04-07 NOTE — ED Notes (Signed)
Pt ambulating to toilet at this time

## 2023-04-07 NOTE — Assessment & Plan Note (Signed)
S/p radiation and lumpectomy  On letrozole

## 2023-04-07 NOTE — ED Triage Notes (Signed)
Pt to ED with friend for SOB since last night. Hx asthma. Denies hx CHF and COPD. Has nebulizer at home, used around 0500 today and also Trilogy inhaler.  Diagnosed with influenza on Thur at PCP. Also has cough since [place].  Respirations unlabored. Expiratory wheezing.

## 2023-04-07 NOTE — Assessment & Plan Note (Addendum)
Asthma Exacerbation  Suspected PNA Decompensated respiratory failure requiring 2 to 3 L nasal cannula in the setting of influenza A, asthma,?  Lobar pneumonia Noted?  Middle right lobe lung collapse versus atelectasis Influenza A positive IV steroids Tamiflu Rocephin and azithromycin for infectious coverage As needed DuoNebs Supplemental oxygen CT Chest to better assess lung pathology  Monitor respiratory status closely

## 2023-04-07 NOTE — Assessment & Plan Note (Signed)
 BP stable Titrate home regimen

## 2023-04-07 NOTE — ED Provider Notes (Signed)
[facility] Provider Note    Event Date/Time   First MD Initiated Contact with Patient 04/07/23 1204     (approximate)   History   Shortness of Breath and Influenza   HPI  Nancy Patton is a 63 y.o. female with a history of diabetes, hypertension, sleep apnea, asthma who presents with shortness of breath in the setting of recent diagnosis of influenza.  Patient complains of chest tightness but no pain.  Is not sure about fevers     Physical Exam   Triage Vital Signs: ED Triage Vitals  Encounter Vitals Group     BP 04/07/23 1130 (!) 146/80     Systolic BP Percentile --      Diastolic BP Percentile --      Pulse Rate 04/07/23 1130 98     Resp 04/07/23 1130 (!) 22     Temp 04/07/23 1131 99.8 F (37.7 C)     Temp Source 04/07/23 1130 Oral     SpO2 04/07/23 1130 98 %     Weight 04/07/23 1129 117.9 kg (260 lb)     Height 04/07/23 1129 1.651 m (5\' 5" )     Head Circumference --      Peak Flow --      Pain Score 04/07/23 1127 8     Pain Loc --      Pain Education --      Exclude from Growth Chart --     Most recent vital signs: Vitals:   04/07/23 1400 04/07/23 1430  BP: (!) 163/74 111/75  Pulse: (!) 101 95  Resp:    Temp:    SpO2: 93% 93%     General: Awake, no distress.  CV:  Good peripheral perfusion.  Resp:  Mild tachypnea, diffuse mild wheezing Abd:  No distention.  Other:     ED Results / Procedures / Treatments   Labs (all labs ordered are listed, but only abnormal results are displayed) Labs Reviewed  RESP PANEL BY RT-PCR (RSV, FLU A&B, COVID)  RVPGX2 - Abnormal; Notable for the following components:      Result Value   Influenza A by PCR POSITIVE (*)    All other components within normal limits  BASIC METABOLIC PANEL - Abnormal; Notable for the following components:   Glucose, Bld 260 (*)    Creatinine, Ser 1.12 (*)    Calcium 8.6 (*)    GFR, Estimated 55 (*)    All other components within normal limits  CBC   TROPONIN I (HIGH SENSITIVITY)     EKG  ED ECG REPORT I, Jene Every, the attending physician, personally viewed and interpreted this ECG.  Date: 04/07/2023  Rhythm: normal sinus rhythm QRS Axis: normal Intervals: normal ST/T Wave abnormalities: normal Narrative Interpretation: no evidence of acute ischemia    RADIOLOGY Chest x-ray viewed interpreted by me, suspect atelectasis    PROCEDURES:  Critical Care performed:   Procedures   MEDICATIONS ORDERED IN ED: Medications  methylPREDNISolone sodium succinate (SOLU-MEDROL) 125 mg/2 mL injection 125 mg (has no administration in time range)  ipratropium-albuterol (DUONEB) 0.5-2.5 (3) MG/3ML nebulizer solution 3 mL (3 mLs Nebulization Given 04/07/23 1221)  ipratropium-albuterol (DUONEB) 0.5-2.5 (3) MG/3ML nebulizer solution 3 mL (3 mLs Nebulization Given 04/07/23 1220)  ipratropium-albuterol (DUONEB) 0.5-2.5 (3) MG/3ML nebulizer solution 3 mL (3 mLs Nebulization Given 04/07/23 1314)     IMPRESSION / MDM / ASSESSMENT AND PLAN / ED COURSE  I reviewed the triage vital signs and  the nursing notes. Patient's presentation is most consistent with acute presentation with potential threat to life or bodily function.  Patient presents with shortness of breath as detailed above, she is tachypneic but oxygen saturations are staying above 90%.  Her influenza test is positive here as well  Chest x-ray shows likely atelectasis, normal white blood cell count, doubt pneumonia  Will treat with DuoNebs here, she has been on prednisone  Patient has become hypoxic after treatment, 2 L nasal cannula started, will add IV Solu-Medrol  Given shortness of breath with hypoxia I suspect this is related to her influenza and asthma will consult the hospitalist for admission        FINAL CLINICAL IMPRESSION(S) / ED DIAGNOSES   Final diagnoses:  Acute respiratory failure with hypoxia (HCC)  Exacerbation of asthma, unspecified asthma  severity, unspecified whether persistent  Influenza A     Rx / DC Orders   ED Discharge Orders     None        Note:  This document was prepared using Dragon voice recognition software and may include unintentional dictation errors.   Jene Every, MD 04/07/23 [phone]

## 2023-04-07 NOTE — ED Notes (Signed)
Pt AOX4, NAD noted, respirations even and unlabored, DOE noted, pt remains on 2 liter's [place].

## 2023-04-07 NOTE — ED Notes (Signed)
Dr. Cyril Loosen aware of pulse ox

## 2023-04-07 NOTE — Assessment & Plan Note (Signed)
Blood sugar 260s SSI  Monitor blood sugar w/ steroid use

## 2023-04-07 NOTE — H&P (Addendum)
History and Physical    Patient: Nancy Patton:[id number] DOB: [birth year] DOA: 04/07/2023 DOS: the patient was seen and examined on 04/07/2023 PCP: Eden Emms, NP  Patient coming from: Home  Chief Complaint:  Chief Complaint  Patient presents with   Shortness of Breath   Influenza   HPI: Nancy Patton is a 63 y.o. female with medical history significant of obesity, asthma, hypertension, hyperlipidemia, type 2 diabetes, breast cancer presenting with acute respiratory failure with hypoxia, influenza A, asthma exacerbation, suspected pneumonia.  Patient noted to have increased work of breathing over the past 3 to 4 days.  Was diagnosed with influenza A by PCP roughly 2 days ago.  Was discharged on course of prednisone as well as Tamiflu.  Still with persistent symptoms including cough wheezing and increased work of breathing.  No chest pain.  No abdominal pain nausea vomiting.  Has had worsening fatigue at home.  No reported nausea or vomiting.  No belly pain or diarrhea.  Non-smoker.  No recent alcohol use. Presented to the ER afebrile, hemodynamically stable.  Satting well on room air.  White count 6.6, hemoglobin 12.6, platelets 186, influenza A positive, creatinine 1.12, glucose 260.  Troponin within normal limits.  Chest x-ray with right middle lobe atelectasis collapse concerning for pneumonia. Review of Systems: As mentioned in the history of present illness. All other systems reviewed and are negative. 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 Last Assessment & Plan: Formatting of this note is different from the original. - Resumed home letrozole 2.5 mg daily   CAP (community acquired pneumonia) 07/06/2021   COVID-19 virus infection 03/27/2022   Diabetes mellitus without complication (HCC)    History of breast cancer 11/06/2020   Hypertension    Hypokalemia 10/14/2021   Multifocal pneumonia 05/13/2021   Obesity    Personal history of radiation therapy    Pulmonary hypertension (HCC)    Sepsis (HCC) 07/05/2021   Sleep apnea    Past Surgical History:  Procedure Laterality Date   BREAST LUMPECTOMY Right 05/23/2019   invasive DCIS radation    CATARACT EXTRACTION     CESAREAN SECTION     x 3    Social History:  reports that she has quit smoking. Her smoking use included cigarettes. She started smoking about 43 years ago. She has a 5 pack-year smoking history. She has never used smokeless tobacco. She reports that she does not drink alcohol and does not use drugs.  No Known Allergies  Family History  Problem Relation Age of Onset   Diabetes Mother    Stroke Mother    Diabetes Father    Diabetes Brother    Cancer Neg Hx    Heart failure Neg Hx    Hypertension Neg Hx    Breast cancer Neg Hx     Prior to Admission medications   Medication Sig Start Date End Date Taking? Authorizing Provider  albuterol (VENTOLIN HFA) 108 (90 Base) MCG/ACT inhaler Inhale 2 puffs into the lungs every 6 (six) hours  as needed for wheezing or shortness of breath. 10/16/21   Sunnie Nielsen, DO  benralizumab [facility] PEN) 30 MG/ML prefilled autoinjector Inject into the skin. 02/12/23   [provider]  budesonide (PULMICORT) 0.25 MG/2ML nebulizer solution 0.25 mg daily.    [provider]  Cholecalciferol (VITAMIN D3) 1.25 MG (50000 UT) CAPS TAKE 1 CAPSULE BY MOUTH ONE TIME PER WEEK 12/27/22   Miki Kins, FNP  citalopram (CELEXA) 40 MG tablet TAKE 1 TABLET BY MOUTH EVERY DAY  03/07/23   Miki Kins, FNP  Dulaglutide (TRULICITY) 0.75 MG/0.5ML SOAJ Inject 0.75 mg into the skin once a week. 03/23/23   Eden Emms, NP  ibuprofen (ADVIL) 800 MG tablet TAKE 1 TABLET BY MOUTH EVERY 8 HOURS AS NEEDED 09/10/22   Miki Kins, FNP  ipratropium-albuterol (DUONEB) 0.5-2.5 (3) MG/3ML SOLN INHALE 1 VIAL VIA NEBULIZER THREE TIMES DAILY AS NEEDED 11/06/22   Miki Kins, FNP  letrozole [facility]) 2.5 MG tablet TAKE 1 TABLET BY MOUTH EVERY DAY 07/31/22   Creig Hines, MD  losartan-hydrochlorothiazide [facility]) 50-12.5 MG tablet TAKE 1 TABLET BY MOUTH EVERY DAY 03/07/23   Miki Kins, FNP  montelukast (SINGULAIR) 10 MG tablet TAKE 1 TABLET BY MOUTH EVERY DAY 03/07/23   Miki Kins, FNP  oseltamivir (TAMIFLU) 75 MG capsule Take 1 capsule (75 mg total) by mouth 2 (two) times daily. 04/05/23   Karie Schwalbe, MD  predniSONE (DELTASONE) 20 MG tablet Take 2 tablets (40 mg total) by mouth daily. For 3 days, then 1 tab daily for 3 days 04/05/23   Tillman Abide I, MD  TRELEGY ELLIPTA 100-62.5-25 MCG/ACT AEPB Take 1 puff by mouth daily. 10/16/21   Sunnie Nielsen, DO    Physical Exam: Vitals:   04/07/23 1230 04/07/23 1300 04/07/23 1400 04/07/23 1430  BP: (!) 176/83 (!) 168/79 (!) 163/74 111/75  Pulse: 95 96 (!) 101 95  Resp:      Temp:      TempSrc:      SpO2: 95% (!) 89% 93% 93%  Weight:      Height:       Physical Exam Constitutional:      Appearance: She is obese.  HENT:     Head: Normocephalic and atraumatic.     Nose: Nose normal.     Mouth/Throat:     Mouth: Mucous membranes are moist.  Eyes:     Pupils: Pupils are equal, round, and reactive to light.  Cardiovascular:     Rate and Rhythm: Normal rate and regular rhythm.  Pulmonary:     Effort: Pulmonary effort is normal.     Breath sounds: Wheezing present.  Abdominal:     General: Bowel sounds are normal.  Musculoskeletal:        General: Normal range of motion.  Skin:    General: Skin  is warm.  Neurological:     General: No focal deficit present.  Psychiatric:        Mood and Affect: Mood normal.     Data Reviewed:  There are no new results to review at this time.  DG Chest 2 View CLINICAL DATA:  SOB.  EXAM: CHEST - 2 VIEW  COMPARISON:  05/20/2022.  FINDINGS: There is linear homogeneous opacity in the (right) middle lobe, favored to represent atelectasis/collapse. Correlate clinically for superimposed pneumonia. Bilateral lung fields are otherwise clear. Bilateral costophrenic angles are clear.  Normal cardio-mediastinal silhouette.  No acute osseous abnormalities.  The soft  tissues are within normal limits.  IMPRESSION: *Middle lobe atelectasis/collapse. Correlate clinically for superimposed pneumonia.  Electronically Signed   By: Jules Schick M.D.   On: 04/07/2023 12:16  Lab Results  Component Value Date   WBC 6.6 04/07/2023   HGB 12.6 04/07/2023   HCT 39.5 04/07/2023   MCV 88.6 04/07/2023   PLT 186 04/07/2023   Last metabolic panel Lab Results  Component Value Date   GLUCOSE 260 (H) 04/07/2023   NA 138 04/07/2023   K 4.0 04/07/2023   CL 104 04/07/2023   CO2 24 04/07/2023   BUN 15 04/07/2023   CREATININE 1.12 (H) 04/07/2023   GFRNONAA 55 (L) 04/07/2023   CALCIUM 8.6 (L) 04/07/2023   PHOS 4.1 08/25/2015   PROT 6.0 04/02/2023   ALBUMIN 3.9 04/02/2023   LABGLOB 2.3 10/24/2022   AGRATIO 1.8 07/24/2022   BILITOT 0.4 04/02/2023   ALKPHOS 61 04/02/2023   AST 13 04/02/2023   ALT 14 04/02/2023   ANIONGAP 10 04/07/2023    Assessment and Plan: Acute respiratory failure with hypoxia (HCC) Asthma Exacerbation  Suspected PNA Decompensated respiratory failure requiring 2 to 3 L nasal cannula in the setting of influenza A, asthma,?  Lobar pneumonia Noted?  Middle right lobe lung collapse versus atelectasis Influenza A positive IV steroids Tamiflu Rocephin and azithromycin for infectious coverage As needed DuoNebs Supplemental  oxygen CT Chest to better assess lung pathology  Monitor respiratory status closely  Essential hypertension BP stable  Titrate home regimen    Cancer of right breast, stage 1, estrogen receptor positive (HCC) S/p radiation and lumpectomy  On letrozole    Controlled type 2 diabetes mellitus without complication, without long-term current use of insulin (HCC) Blood sugar 260s SSI  Monitor blood sugar w/ steroid use      Greater than 50% was spent in counseling and coordination of care with patient Total encounter time 80 minutes or more   Advance Care Planning:   Code Status: Full Code   Consults: None   Family Communication: No family at the bedside   Severity of Illness: The appropriate patient status for this patient is INPATIENT. Inpatient status is judged to be reasonable and necessary in order to provide the required intensity of service to ensure the patient's safety. The patient's presenting symptoms, physical exam findings, and initial radiographic and laboratory data in the context of their chronic comorbidities is felt to place them at high risk for further clinical deterioration. Furthermore, it is not anticipated that the patient will be medically stable for discharge from the hospital within 2 midnights of admission.   * I certify that at the point of admission it is my clinical judgment that the patient will require inpatient hospital care spanning beyond 2 midnights from the point of admission due to high intensity of service, high risk for further deterioration and high frequency of surveillance required.*  Author: [name], MD 04/07/2023 3:28 PM  For on call review [email or web address].

## 2023-04-07 NOTE — ED Notes (Signed)
Two unsuccessful IV attempts. Will defer to another staff member.

## 2023-04-08 DIAGNOSIS — Z170 Estrogen receptor positive status [ER+]: Secondary | ICD-10-CM

## 2023-04-08 DIAGNOSIS — I10 Essential (primary) hypertension: Secondary | ICD-10-CM | POA: Diagnosis not present

## 2023-04-08 DIAGNOSIS — C50911 Malignant neoplasm of unspecified site of right female breast: Secondary | ICD-10-CM | POA: Diagnosis not present

## 2023-04-08 DIAGNOSIS — E119 Type 2 diabetes mellitus without complications: Secondary | ICD-10-CM

## 2023-04-08 DIAGNOSIS — J9601 Acute respiratory failure with hypoxia: Secondary | ICD-10-CM | POA: Diagnosis not present

## 2023-04-08 LAB — COMPREHENSIVE METABOLIC PANEL
ALT: 21 U/L (ref 0–44)
AST: 21 U/L (ref 15–41)
Albumin: 3.8 g/dL (ref 3.5–5.0)
Alkaline Phosphatase: 60 U/L (ref 38–126)
Anion gap: 10 (ref 5–15)
BUN: 16 mg/dL (ref 8–23)
CO2: 25 mmol/L (ref 22–32)
Calcium: 9.2 mg/dL (ref 8.9–10.3)
Chloride: 104 mmol/L (ref 98–111)
Creatinine, Ser: 0.77 mg/dL (ref 0.44–1.00)
GFR, Estimated: >60 mL/min (ref >60)
Glucose, Bld: 134 mg/dL — ABNORMAL HIGH (ref 70–99)
Potassium: 3.8 mmol/L (ref 3.5–5.1)
Sodium: 139 mmol/L (ref 135–145)
Total Bilirubin: 0.7 mg/dL (ref 0.0–1.2)
Total Protein: 7.1 g/dL (ref 6.5–8.1)

## 2023-04-08 LAB — GLUCOSE, CAPILLARY
Glucose-Capillary: 126 mg/dL — ABNORMAL HIGH (ref 70–99)
Glucose-Capillary: 186 mg/dL — ABNORMAL HIGH (ref 70–99)
Glucose-Capillary: 235 mg/dL — ABNORMAL HIGH (ref 70–99)
Glucose-Capillary: 163 mg/dL — ABNORMAL HIGH (ref 70–99)

## 2023-04-08 LAB — HIV ANTIBODY (ROUTINE TESTING W REFLEX): HIV Screen 4th Generation wRfx: NONREACTIVE

## 2023-04-08 LAB — CBC
HCT: 39.8 % (ref 36.0–46.0)
Hemoglobin: 13 g/dL (ref 12.0–15.0)
MCH: 28.3 pg (ref 26.0–34.0)
MCHC: 32.7 g/dL (ref 30.0–36.0)
MCV: 86.5 fL (ref 80.0–100.0)
Platelets: 219 10*3/uL (ref 150–400)
RBC: 4.6 MIL/uL (ref 3.87–5.11)
RDW: 13.1 % (ref 11.5–15.5)
WBC: 5.7 10*3/uL (ref 4.0–10.5)
nRBC: 0 % (ref 0.0–0.2)

## 2023-04-08 MED ORDER — ORAL CARE MOUTH RINSE: 15.0000 mL | OROMUCOSAL | Status: DC | PRN

## 2023-04-08 MED ORDER — AZITHROMYCIN 250 MG PO TABS
500.0000 mg | ORAL_TABLET | Freq: Every day | ORAL | Status: DC
Start: 2023-04-08 — End: 2023-04-12
  Administered 2023-04-08: 500 mg via ORAL
  Filled 2023-04-08: qty 2

## 2023-04-08 NOTE — Progress Notes (Signed)
   04/08/23 0900  Spiritual Encounters  Type of Visit Initial  Care provided to: Patient  Referral source Nurse (RN/NT/LPN)  Reason for visit Advance directives  OnCall Visit Yes   Chaplain provided and explained paperwork for HCPOA.

## 2023-04-08 NOTE — Progress Notes (Signed)
Mobility Specialist - Progress Note   04/08/23 1125  Mobility  Activity Ambulated with assistance in hallway;Stood at bedside;Dangled on edge of bed  Level of Assistance Standby assist, set-up cues, supervision of patient - no hands on  Assistive Device None  Distance Ambulated (ft) 180 ft  Activity Response Tolerated well  Mobility Referral Yes  Mobility visit 1 Mobility  Mobility Specialist Start Time (ACUTE ONLY) 1111  Mobility Specialist Stop Time (ACUTE ONLY) 1124  Mobility Specialist Time Calculation (min) (ACUTE ONLY) 13 min   Pt side lying in bed on 2L upon arrival. Pt completes bed mobility, STS, and ambulates in hallway Supervision. Pt does endorse some SOB at end of ambulation, RN notified. Pt left in bed with needs in reach.   Nancy Patton  Mobility Specialist  04/08/23 11:27 AM

## 2023-04-08 NOTE — Progress Notes (Signed)
PT Cancellation Note  Patient Details Name: Nancy Patton MRN: [id number] DOB: [birth year]   Cancelled Treatment:     PT eval and treat not completed today because pt refused 2/2 to feel tiered after working with mobility specialist nad doesn't think she needs PT. Pt received eating lunch at the [employer]  with O2 via [place]. PT communicated with Dr. Sherryll Burger and he advised to hold pt for today and to attempt tomorrow. PT will attempt again tomorrow.   Janet Berlin PT DPT 1:09 PM,04/08/23

## 2023-04-08 NOTE — Progress Notes (Signed)
1      PROGRESS NOTE    Nancy Patton  UEA:[id number] DOB: 1960/11/25 DOA: 04/07/2023 PCP: Eden Emms, NP    Brief Narrative:    63 y.o. female with medical history significant of obesity, asthma, hypertension, hyperlipidemia, type 2 diabetes, breast cancer presenting with acute respiratory failure with hypoxia, influenza A, asthma exacerbation.  2/16: Pulmonary consult   Assessment & Plan:   Active Problems:   Acute respiratory failure with hypoxia (HCC)   Essential hypertension   Controlled type 2 diabetes mellitus without complication, without long-term current use of insulin (HCC)   Cancer of right breast, stage 1, estrogen receptor positive (HCC)  Acute respiratory failure with hypoxia (HCC) Asthma Exacerbation  Community-acquired PNA Decompensated respiratory failure requiring 2 to 3 L nasal cannula in the setting of influenza A, asthma,?  Lobar pneumonia Noted?  Middle right lobe lung collapse versus atelectasis Influenza A positive IV steroids given initially.  Now transition to oral prednisone Continue Tamiflu Continue Rocephin and azithromycin for infectious coverage As needed DuoNebs Supplemental oxygen CT Chest shows partial atelectasis/consolidation of the right middle lobe likely mucous plugging. Monitor respiratory status closely Recommend incentive spirometry, flutter valve and chest PT with vest to help with mucus   Essential hypertension BP stable  Titrate home regimen      Cancer of right breast, stage 1, estrogen receptor positive (HCC) S/p radiation and lumpectomy  On letrozole      Controlled type 2 diabetes mellitus without complication, without long-term current use of insulin (HCC) Blood sugar controlled SSI  Monitor blood sugar w/ steroid use    DVT prophylaxis: (Lovenox      Code Status: (Full code Family Communication: None at bedside Disposition Plan: Possible discharge in next 1 to 2 days depending on clinical  condition   Consultants:  Pulmonary   Antimicrobials:  Rocephin Zithromax   Subjective:  Feeling somewhat better.  Still quite congested and coughing.  Unable to expectorate much  Objective: Vitals:   04/07/23 1941 04/08/23 0419 04/08/23 0617 04/08/23 0945  BP: 129/70 121/63  124/66  Pulse: 93 75  63  Resp: 20 20  18   Temp: 97.9 F (36.6 C) 98.4 F (36.9 C)  98.9 F (37.2 C)  TempSrc: Oral Oral  Oral  SpO2: 94% 100% 95% 94%  Weight:      Height:        Intake/Output Summary (Last 24 hours) at 04/08/2023 1330 Last data filed at 04/08/2023 1114 Gross per 24 hour  Intake 1906.25 ml  Output --  Net 1906.25 ml   Filed Weights   04/07/23 1129  Weight: 117.9 kg    Examination:  General exam: Appears calm and comfortable  Respiratory system: Rhonchi at the bases mild expiratory wheezing bilaterally Cardiovascular system: S1 & S2 heard, RRR. No JVD, murmurs. No pedal edema. Gastrointestinal system: Abdomen is soft, benign Central nervous system: Alert and oriented. No focal neurological deficits. Extremities: Symmetric 5 x 5 power. Skin: No rashes, lesions or ulcers Psychiatry: Judgement and insight appear normal. Mood & affect appropriate.     Data Reviewed: I have personally reviewed following labs and imaging studies  CBC: Recent Labs  Lab 04/02/23 0744 04/07/23 1130 04/08/23 0627  WBC 4.8 6.6 5.7  HGB 12.3 12.6 13.0  HCT 37.6 39.5 39.8  MCV 87.2 88.6 86.5  PLT 209.0 186 219   Basic Metabolic Panel: Recent Labs  Lab 04/02/23 0744 04/07/23 1130 04/08/23 0627  NA 143 138 139  K  4.5 4.0 3.8  CL 104 104 104  CO2 30 24 25   GLUCOSE 143* 260* 134*  BUN 22 15 16   CREATININE 0.92 1.12* 0.77  CALCIUM 8.7 8.6* 9.2   GFR: Estimated Creatinine Clearance: 92.5 mL/min (by C-G formula based on SCr of 0.77 mg/dL). Liver Function Tests: Recent Labs  Lab 04/02/23 0744 04/08/23 0627  AST 13 21  ALT 14 21  ALKPHOS 61 60  BILITOT 0.4 0.7  PROT 6.0  7.1  ALBUMIN 3.9 3.8    CBG: Recent Labs  Lab 04/07/23 1651 04/07/23 2058 04/08/23 0932  GLUCAP 220* 342* 126*    Recent Results (from the past 240 hours)  Resp panel by RT-PCR (RSV, Flu A&B, Covid) Anterior Nasal Swab     Status: Abnormal   Collection Time: 04/07/23 11:32 AM   Specimen: Anterior Nasal Swab  Result Value Ref Range Status   SARS Coronavirus 2 by RT PCR NEGATIVE NEGATIVE Final    Comment: (NOTE) SARS-CoV-2 target nucleic acids are NOT DETECTED.  The SARS-CoV-2 RNA is generally detectable in upper respiratory specimens during the acute phase of infection. The lowest concentration of SARS-CoV-2 viral copies this assay can detect is 138 copies/mL. A negative result does not preclude SARS-Cov-2 infection and should not be used as the sole basis for treatment or other patient management decisions. A negative result may occur with  improper specimen collection/handling, submission of specimen other than nasopharyngeal swab, presence of viral mutation(s) within the areas targeted by this assay, and inadequate number of viral copies(<138 copies/mL). A negative result must be combined with clinical observations, patient history, and epidemiological information. The expected result is Negative.  Fact Sheet for Patients:  BloggerCourse.com  Fact Sheet for Healthcare Providers:  SeriousBroker.it  This test is no t yet approved or cleared by the [place] FDA and  has been authorized for detection and/or diagnosis of SARS-CoV-2 by FDA under an Emergency Use Authorization (EUA). This EUA will remain  in effect (meaning this test can be used) for the duration of the COVID-19 declaration under Section 564(b)(1) of the Act, 21 U.S.C.section 360bbb-3(b)(1), unless the authorization is terminated  or revoked sooner.       Influenza A by PCR POSITIVE (A) NEGATIVE Final   Influenza B by PCR NEGATIVE NEGATIVE Final     Comment: (NOTE) The Xpert Xpress SARS-CoV-2/FLU/RSV plus assay is intended as an aid in the diagnosis of influenza from Nasopharyngeal swab specimens and should not be used as a sole basis for treatment. Nasal washings and aspirates are unacceptable for Xpert Xpress SARS-CoV-2/FLU/RSV testing.  Fact Sheet for Patients: BloggerCourse.com  Fact Sheet for Healthcare Providers: SeriousBroker.it  This test is not yet approved or cleared by the [place] FDA and has been authorized for detection and/or diagnosis of SARS-CoV-2 by FDA under an Emergency Use Authorization (EUA). This EUA will remain in effect (meaning this test can be used) for the duration of the COVID-19 declaration under Section 564(b)(1) of the Act, 21 U.S.C. section 360bbb-3(b)(1), unless the authorization is terminated or revoked.     Resp Syncytial Virus by PCR NEGATIVE NEGATIVE Final    Comment: (NOTE) Fact Sheet for Patients: BloggerCourse.com  Fact Sheet for Healthcare Providers: SeriousBroker.it  This test is not yet approved or cleared by the [place] FDA and has been authorized for detection and/or diagnosis of SARS-CoV-2 by FDA under an Emergency Use Authorization (EUA). This EUA will remain in effect (meaning this test can be used) for the duration of  the COVID-19 declaration under Section 564(b)(1) of the Act, 21 U.S.C. section 360bbb-3(b)(1), unless the authorization is terminated or revoked.  Performed at [facility], [address] [place], [place] [address]   MRSA Next Gen by PCR, Nasal     Status: None   Collection Time: 04/07/23  8:00 PM   Specimen: Nasal Mucosa; Nasal Swab  Result Value Ref Range Status   MRSA by PCR Next Gen NOT DETECTED NOT DETECTED Final    Comment: (NOTE) The GeneXpert MRSA Assay (FDA approved for NASAL specimens only), is one component of a  comprehensive MRSA colonization surveillance program. It is not intended to diagnose MRSA infection nor to guide or monitor treatment for MRSA infections. Test performance is not FDA approved in patients less than 28 years old. Performed at [facility], [address] [place], [place] [address]          Radiology Studies: CT CHEST WO CONTRAST Result Date: 04/07/2023 CLINICAL DATA:  Respiratory illness, nondiagnostic xray EXAM: CT CHEST WITHOUT CONTRAST TECHNIQUE: Multidetector CT imaging of the chest was performed following the standard protocol without IV contrast. RADIATION DOSE REDUCTION: This exam was performed according to the departmental dose-optimization program which includes automated exposure control, adjustment of the mA and/or kV according to patient size and/or use of iterative reconstruction technique. COMPARISON:  X-ray 04/07/2023, CT 12/19/2021 FINDINGS: Cardiovascular: Normal heart size. No pericardial effusion. Mid ascending thoracic aorta measures 4.2 cm in diameter (previously 3.8 cm). Pulmonary trunk measures 3.6 cm in diameter. Mediastinum/Nodes: No pathologically enlarged axillary or mediastinal lymph nodes. Evaluation of the hilar structures is limited in the absence of intravenous contrast. Within this limitation, no obvious hilar adenopathy or mass is identified. Thyroid gland, trachea, and esophagus within normal limits. Lungs/Pleura: Partial atelectasis/consolidation of the right middle lobe. There is some debris within the right middle lobe bronchioles. Bronchus intermedius is patent. The remaining lung fields are otherwise clear. No pleural effusion or pneumothorax. Upper Abdomen: No acute abnormality. Musculoskeletal: No chest wall mass or suspicious bone lesions identified. IMPRESSION: 1. Partial atelectasis/consolidation of the right middle lobe with some debris within the right middle lobe bronchioles. Findings may be secondary to mucous plugging.  Radiographic follow-up is recommended to ensure resolution to exclude the possibility of a endobronchial lesion. 2. Ascending thoracic aortic aneurysm measuring 4.2 cm. Recommend annual imaging followup by CTA or MRA. This recommendation follows 2010 ACCF/AHA/AATS/ACR/ASA/SCA/SCAI/SIR/STS/SVM Guidelines for the Diagnosis and Management of Patients with Thoracic Aortic Disease. Circulation. 2010; 121: Q657-Q469. Aortic aneurysm NOS (ICD10-I71.9) Electronically Signed   By: Duanne Guess D.O.   On: 04/07/2023 16:19   DG Chest 2 View Result Date: 04/07/2023 CLINICAL DATA:  SOB. EXAM: CHEST - 2 VIEW COMPARISON:  05/20/2022. FINDINGS: There is linear homogeneous opacity in the (right) middle lobe, favored to represent atelectasis/collapse. Correlate clinically for superimposed pneumonia. Bilateral lung fields are otherwise clear. Bilateral costophrenic angles are clear. Normal cardio-mediastinal silhouette. No acute osseous abnormalities. The soft tissues are within normal limits. IMPRESSION: *Middle lobe atelectasis/collapse. Correlate clinically for superimposed pneumonia. Electronically Signed   By: Jules Schick M.D.   On: 04/07/2023 12:16        Scheduled Meds:  azithromycin  500 mg Oral Daily   enoxaparin (LOVENOX) injection  0.5 mg/kg Subcutaneous Q24H   losartan  50 mg Oral Daily   And   hydrochlorothiazide  12.5 mg Oral Daily   insulin aspart  0-15 Units Subcutaneous TID AC & HS   insulin aspart  4 Units  Subcutaneous TID WC   oseltamivir  75 mg Oral BID   predniSONE  50 mg Oral Q breakfast   Continuous Infusions:  sodium chloride Stopped (04/08/23 0954)   cefTRIAXone (ROCEPHIN)  IV Stopped (04/07/23 1639)     LOS: 1 day    Time spent: 35 minutes    Mertie Haslem Sherryll Burger, MD [facility] Pager [phone]  If 7PM-7AM, please contact night-coverage [email or web address]  04/08/2023, 1:30 PM

## 2023-04-08 NOTE — Consult Note (Signed)
PULMONOLOGY         Date: 04/08/2023,   MRN# [id number] Nancy Patton [birth year]     AdmissionWeight: 117.9 kg                 CurrentWeight: 117.9 kg  Referring provider: Dr Sherryll Burger   CHIEF COMPLAINT:   Acute exacerbation of Asthma with influenza A infection   HISTORY OF PRESENT ILLNESS   63 yo with history of moderate persistent atopic allergic asthma who came in with acute on chronic hypoxemic respiratory failure secondary to viral respiratory tract infection from influenza A.  Reports 24 to 48 hours worth of myalgias malaise and worsening dyspnea prior to coming in.  She was treated with steroids and Tamiflu prior to coming into the ER however reports progressively worsening dyspnea and concerning labored breathing and came in for the symptoms.  While in the ER she was persistently positive for influenza on viral testing chest x-ray was performed with airspace and interstitial opacification worse on the right middle lung zone concerning for possible pneumonia.  Mild AKI noted on BMP stable chronic anemia on CBC with absence of leukocytosis.  She received IV steroids and has been transitioned to PO regimen.  She still is requiring supplemental O2.    PAST MEDICAL HISTORY   Past Medical History:  Diagnosis Date   Acute asthma exacerbation 12/18/2021   Acute respiratory distress 09/26/2014   Acute respiratory failure with hypoxia (HCC) 07/06/2021   Arthritis    Asthma    Asthma exacerbation 10/13/2021   Asthmatic bronchitis with acute exacerbation 09/26/2014   Asthmatic bronchitis with exacerbation 09/26/2014   Breast cancer (HCC)    Cancer of right breast, stage 1, estrogen receptor positive (HCC) 05/15/2019   Formatting of this note might be different from the original. Last Assessment & Plan: Formatting of this note is different from the original. - Resumed home letrozole 2.5 mg daily   CAP (community acquired pneumonia) 07/06/2021   COVID-19 virus  infection 03/27/2022   Diabetes mellitus without complication (HCC)    History of breast cancer 11/06/2020   Hypertension    Hypokalemia 10/14/2021   Multifocal pneumonia 05/13/2021   Obesity    Personal history of radiation therapy    Pulmonary hypertension (HCC)    Sepsis (HCC) 07/05/2021   Sleep apnea      SURGICAL HISTORY   Past Surgical History:  Procedure Laterality Date   BREAST LUMPECTOMY Right 05/23/2019   invasive DCIS radation    CATARACT EXTRACTION     CESAREAN SECTION     x 3      FAMILY HISTORY   Family History  Problem Relation Age of Onset   Diabetes Mother    Stroke Mother    Diabetes Father    Diabetes Brother    Cancer Neg Hx    Heart failure Neg Hx    Hypertension Neg Hx    Breast cancer Neg Hx      SOCIAL HISTORY   Social History   Tobacco Use   Smoking status: Former    Average packs/day: 0.5 packs/day for 10.0 years (5.0 ttl pk-yrs)    Types: Cigarettes    Start date: 1982   Smokeless tobacco: Never   Tobacco comments:    Quit smoking 30 years ago.  Vaping Use   Vaping status: Never Used  Substance Use Topics   Alcohol use: No   Drug use: No     MEDICATIONS    Home  Medication:    Current Medication:  Current Facility-Administered Medications:    0.9 %  sodium chloride infusion, , Intravenous, Continuous, Alvester Morin Francoise Schaumann, MD, Stopped at 04/08/23 0954   azithromycin (ZITHROMAX) tablet 500 mg, 500 mg, Oral, Daily, [name], [name], [name]   cefTRIAXone (ROCEPHIN) 2 g in sodium chloride 0.9 % 100 mL IVPB, 2 g, Intravenous, Q24H, [name], MD, Stopped at 04/07/23 1639   enoxaparin (LOVENOX) injection 60 mg, 0.5 mg/kg, Subcutaneous, Q24H, [name], MD, 60 mg at 04/07/23 2212   losartan (COZAAR) tablet 50 mg, 50 mg, Oral, Daily, 50 mg at 04/08/23 0943 **AND** hydrochlorothiazide (HYDRODIURIL) tablet 12.5 mg, 12.5 mg, Oral, Daily, [name], [name], [name], 12.5 mg at 04/08/23 0943   insulin aspart (novoLOG) injection  0-15 Units, 0-15 Units, Subcutaneous, TID AC & HS, [name], NP, 2 Units at 04/08/23 0944   insulin aspart (novoLOG) injection 4 Units, 4 Units, Subcutaneous, TID WC, [name], MD, 4 Units at 04/08/23 0944   ipratropium-albuterol (DUONEB) 0.5-2.5 (3) MG/3ML nebulizer solution 3 mL, 3 mL, Nebulization, Q4H PRN, [name], MD   ondansetron [facility]) tablet 4 mg, 4 mg, Oral, Q6H PRN **OR** ondansetron (ZOFRAN) injection 4 mg, 4 mg, Intravenous, Q6H PRN, [name], MD   oseltamivir (TAMIFLU) capsule 75 mg, 75 mg, Oral, BID, [name], MD, 75 mg at 04/08/23 0943   predniSONE (DELTASONE) tablet 50 mg, 50 mg, Oral, Q breakfast, [name], MD, 50 mg at 04/08/23 1610    ALLERGIES   Patient has no known allergies.     REVIEW OF SYSTEMS    Review of Systems:  Gen:  Denies  fever, sweats, chills weigh loss  HEENT: Denies blurred vision, double vision, ear pain, eye pain, hearing loss, nose bleeds, sore throat Cardiac:  No dizziness, chest pain or heaviness, chest tightness,edema Resp:   reports dyspnea chronically  Gi: Denies swallowing difficulty, stomach pain, nausea or vomiting, diarrhea, constipation, bowel incontinence Gu:  Denies bladder incontinence, burning urine Ext:   Denies Joint pain, stiffness or swelling Skin: Denies  skin rash, easy bruising or bleeding or hives Endoc:  Denies polyuria, polydipsia , polyphagia or weight change Psych:   Denies depression, insomnia or hallucinations   Other:  All other systems negative   VS: BP 124/66 (BP Location: Left Arm)   Pulse 63   Temp 98.9 F (37.2 C) (Oral)   Resp 18   Ht 5\' 5"  (1.651 m)   Wt 117.9 kg   SpO2 94%   BMI 43.27 kg/m      PHYSICAL EXAM    GENERAL:NAD, no fevers, chills, no weakness no fatigue HEAD: Normocephalic, atraumatic.  EYES: Pupils equal, round, reactive to light. Extraocular muscles intact. No scleral icterus.  MOUTH: Moist mucosal membrane. Dentition  intact. No abscess noted.  EAR, NOSE, THROAT: Clear without exudates. No external lesions.  NECK: Supple. No thyromegaly. No nodules. No JVD.  PULMONARY: decreased breath sounds with mild rhonchi worse at bases bilaterally.  CARDIOVASCULAR: S1 and S2. Regular rate and rhythm. No murmurs, rubs, or gallops. No edema. Pedal pulses 2+ bilaterally.  GASTROINTESTINAL: Soft, nontender, nondistended. No masses. Positive bowel sounds. No hepatosplenomegaly.  MUSCULOSKELETAL: No swelling, clubbing, or edema. Range of motion full in all extremities.  NEUROLOGIC: Cranial nerves II through XII are intact. No gross focal neurological deficits. Sensation intact. Reflexes intact.  SKIN: No ulceration, lesions, rashes, or cyanosis. Skin warm and dry. Turgor intact.  PSYCHIATRIC: Mood, affect within normal  limits. The patient is awake, alert and oriented x 3. Insight, judgment intact.       IMAGING   Narrative & Impression  CLINICAL DATA:  Respiratory illness, nondiagnostic xray   EXAM: CT CHEST WITHOUT CONTRAST   TECHNIQUE: Multidetector CT imaging of the chest was performed following the standard protocol without IV contrast.   RADIATION DOSE REDUCTION: This exam was performed according to the departmental dose-optimization program which includes automated exposure control, adjustment of the mA and/or kV according to patient size and/or use of iterative reconstruction technique.   COMPARISON:  X-ray 04/07/2023, CT 12/19/2021   FINDINGS: Cardiovascular: Normal heart size. No pericardial effusion. Mid ascending thoracic aorta measures 4.2 cm in diameter (previously 3.8 cm). Pulmonary trunk measures 3.6 cm in diameter.   Mediastinum/Nodes: No pathologically enlarged axillary or mediastinal lymph nodes. Evaluation of the hilar structures is limited in the absence of intravenous contrast. Within this limitation, no obvious hilar adenopathy or mass is identified. Thyroid gland, trachea, and  esophagus within normal limits.   Lungs/Pleura: Partial atelectasis/consolidation of the right middle lobe. There is some debris within the right middle lobe bronchioles. Bronchus intermedius is patent. The remaining lung fields are otherwise clear. No pleural effusion or pneumothorax.   Upper Abdomen: No acute abnormality.   Musculoskeletal: No chest wall mass or suspicious bone lesions identified.   IMPRESSION: 1. Partial atelectasis/consolidation of the right middle lobe with some debris within the right middle lobe bronchioles. Findings may be secondary to mucous plugging. Radiographic follow-up is recommended to ensure resolution to exclude the possibility of a endobronchial lesion. 2. Ascending thoracic aortic aneurysm measuring 4.2 cm. Recommend annual imaging followup by CTA or MRA. This recommendation follows 2010 ACCF/AHA/AATS/ACR/ASA/SCA/SCAI/SIR/STS/SVM Guidelines for the Diagnosis and Management of Patients with Thoracic Aortic Disease. Circulation. 2010; 121: Z610-R604. Aortic aneurysm NOS (ICD10-I71.9)     Electronically Signed   By: Duanne Guess D.O.   On: 04/07/2023 16:19     ASSESSMENT/PLAN    -Acute exacerbation of severe persistent asthma   - Patient on Fasenra injections on outpatient   - she reports insurance non coverage and possibly skipping dosing of Fasenra   -patient currently on steroids and antibiotics with nebulizer therapy   -she has received solumedrol and is now on prednisone   - she is on rocephin and zithromax    - we can change to augmentin tommorow with plan for 5-7 day course   - recommend to reduce prednisone by 5mg  daily       Atelectasis worse at RML   - have discussed with patient use of IS and flutter valve   -she is compliant and using it each hour   Mucus plugging of tracheobronchial tree    - currently patient is being treated with non invasive means     - she is using flutter valve to help expectorate mucus     -  we discussed potentially needing airway inspection and therapeutic aspiration if this continues and persists despite full scope of medical therapy     Influenza A    - currently on Tamiflu     - patient is improving daily and is ambulatory now but still on oxygen.  She does have diarreah but its also improving       Thank you for allowing me to participate in the care of this patient.   Patient/Family are satisfied with care plan and all questions have been answered.    Provider disclosure: Patient with at least one  acute or chronic illness or injury that poses a threat to life or bodily function and is being managed actively during this encounter.  All of the below services have been performed independently by signing provider:  review of prior documentation from internal and or external health records.  Review of previous and current lab results.  Interview and comprehensive assessment during patient visit today. Review of current and previous chest radiographs/CT scans. Discussion of management and test interpretation with health care team and patient/family.   This document was prepared using Dragon voice recognition software and may include unintentional dictation errors.     Vida Rigger, M.D.  Division of Pulmonary & Critical Care Medicine

## 2023-04-08 NOTE — Progress Notes (Signed)
PHARMACIST - PHYSICIAN COMMUNICATION  CONCERNING: Antibiotic IV to Oral Route Change Policy  RECOMMENDATION: This patient is receiving azithromycin by the intravenous route.  Based on criteria approved by the Pharmacy and Therapeutics Committee, the antibiotic(s) is/are being converted to the equivalent oral dose form(s).   DESCRIPTION: These criteria include: Patient being treated for a respiratory tract infection, urinary tract infection, cellulitis or clostridium difficile associated diarrhea if on metronidazole The patient is not neutropenic and does not exhibit a GI malabsorption state The patient is eating (either orally or via tube) and/or has been taking other orally administered medications for a least 24 hours The patient is improving clinically and has a Tmax < 100.5  If you have questions about this conversion, please contact the Pharmacy Department   Gardner Candle, PharmD, BCPS Clinical Pharmacist 04/08/2023 9:42 AM

## 2023-04-08 NOTE — Progress Notes (Addendum)
Mobility Specialist - Progress Note   04/08/23 1507  Mobility  Activity Ambulated with assistance in hallway  Level of Assistance Standby assist, set-up cues, supervision of patient - no hands on  Assistive Device None  Distance Ambulated (ft) 180 ft  Activity Response Tolerated well  Mobility Referral Yes  Mobility visit 1 Mobility  Mobility Specialist Start Time (ACUTE ONLY) 1434  Mobility Specialist Stop Time (ACUTE ONLY) 1459  Mobility Specialist Time Calculation (min) (ACUTE ONLY) 25 min    Pt sitting in recliner on RA upon arrival. Pt STS and ambulates in hallway SBA with no LOB noted. Pt returns to recliner with needs in reach.   RN requested Mobility Specialist to perform oxygen saturation test with pt which includes removing pt from oxygen both at rest and while ambulating.  Below are the results from that testing.     Patient Saturations on 1L at Rest = 86 spO2 %   Patient Saturations on 3L while Ambulating in hallway = 84 sp02 % . Standing rest break and performed pursed lip breathing for 1 minute with sp02 at 92 % and remained above 90 throughout remainder of ambulation.    At end of testing pt left in room on 2.5 Liters of oxygen.   Reported results to RN.   Terrilyn Saver  Mobility Specialist  04/08/23 3:14 PM

## 2023-04-08 NOTE — Plan of Care (Signed)

## 2023-04-09 ENCOUNTER — Inpatient Hospital Stay: Payer: 59

## 2023-04-09 DIAGNOSIS — E119 Type 2 diabetes mellitus without complications: Secondary | ICD-10-CM | POA: Diagnosis not present

## 2023-04-09 DIAGNOSIS — C50911 Malignant neoplasm of unspecified site of right female breast: Secondary | ICD-10-CM | POA: Diagnosis not present

## 2023-04-09 DIAGNOSIS — J9601 Acute respiratory failure with hypoxia: Secondary | ICD-10-CM | POA: Diagnosis not present

## 2023-04-09 DIAGNOSIS — I10 Essential (primary) hypertension: Secondary | ICD-10-CM | POA: Diagnosis not present

## 2023-04-09 LAB — BASIC METABOLIC PANEL
Anion gap: 9 (ref 5–15)
BUN: 19 mg/dL (ref 8–23)
CO2: 27 mmol/L (ref 22–32)
Calcium: 9.1 mg/dL (ref 8.9–10.3)
Chloride: 103 mmol/L (ref 98–111)
Creatinine, Ser: 0.79 mg/dL (ref 0.44–1.00)
GFR, Estimated: >60 mL/min (ref >60)
Glucose, Bld: 88 mg/dL (ref 70–99)
Potassium: 3.4 mmol/L — ABNORMAL LOW (ref 3.5–5.1)
Sodium: 139 mmol/L (ref 135–145)

## 2023-04-09 LAB — CBC
HCT: 41.3 % (ref 36.0–46.0)
Hemoglobin: 13.6 g/dL (ref 12.0–15.0)
MCH: 28.5 pg (ref 26.0–34.0)
MCHC: 32.9 g/dL (ref 30.0–36.0)
MCV: 86.6 fL (ref 80.0–100.0)
Platelets: 212 10*3/uL (ref 150–400)
RBC: 4.77 MIL/uL (ref 3.87–5.11)
RDW: 13 % (ref 11.5–15.5)
WBC: 7.5 10*3/uL (ref 4.0–10.5)
nRBC: 0 % (ref 0.0–0.2)

## 2023-04-09 LAB — GLUCOSE, CAPILLARY
Glucose-Capillary: 124 mg/dL — ABNORMAL HIGH (ref 70–99)
Glucose-Capillary: 167 mg/dL — ABNORMAL HIGH (ref 70–99)
Glucose-Capillary: 79 mg/dL (ref 70–99)
Glucose-Capillary: 184 mg/dL — ABNORMAL HIGH (ref 70–99)

## 2023-04-09 MED ORDER — FUROSEMIDE 10 MG/ML IJ SOLN
40.0000 mg | Freq: Once | INTRAMUSCULAR | Status: AC
  Administered 2023-04-09: 40 mg via INTRAVENOUS
  Filled 2023-04-09: qty 4

## 2023-04-09 MED ORDER — AMOXICILLIN-POT CLAVULANATE 875-125 MG PO TABS
1.0000 | ORAL_TABLET | Freq: Two times a day (BID) | ORAL | Status: DC
  Administered 2023-04-09 – 2023-04-10 (×3): 1 via ORAL
  Filled 2023-04-09 (×3): qty 1

## 2023-04-09 MED ORDER — POTASSIUM CHLORIDE CRYS ER 20 MEQ PO TBCR
40.0000 meq | EXTENDED_RELEASE_TABLET | Freq: Once | ORAL | Status: AC
  Administered 2023-04-09: 40 meq via ORAL
  Filled 2023-04-09: qty 2

## 2023-04-09 MED ORDER — PREDNISONE 20 MG PO TABS
45.0000 mg | ORAL_TABLET | Freq: Every day | ORAL | Status: DC
  Administered 2023-04-10: 45 mg via ORAL
  Filled 2023-04-09: qty 1

## 2023-04-09 NOTE — Progress Notes (Signed)
PULMONOLOGY         Date: 04/09/2023,   MRN# [id number] Nancy Patton [birth year]     AdmissionWeight: 117.9 kg                 CurrentWeight: 117.9 kg  Referring provider: Dr Sherryll Burger   CHIEF COMPLAINT:   Acute exacerbation of Asthma with influenza A infection   HISTORY OF PRESENT ILLNESS   63 yo with history of moderate persistent atopic allergic asthma who came in with acute on chronic hypoxemic respiratory failure secondary to viral respiratory tract infection from influenza A.  Reports 24 to 48 hours worth of myalgias malaise and worsening dyspnea prior to coming in.  She was treated with steroids and Tamiflu prior to coming into the ER however reports progressively worsening dyspnea and concerning labored breathing and came in for the symptoms.  While in the ER she was persistently positive for influenza on viral testing chest x-ray was performed with airspace and interstitial opacification worse on the right middle lung zone concerning for possible pneumonia.  Mild AKI noted on BMP stable chronic anemia on CBC with absence of leukocytosis.  She received IV steroids and has been transitioned to PO regimen.  She still is requiring supplemental O2.    04/09/23- patient stable overnight on 2L/min [place]. We discussed potentially needing bronchoscopy with RML infiltrate/mucus plugging does not resolve.  Ive ordered repeat CXR this morning. CBC normal BMP with mild hypokalemia. Adequate UOP overnight with stable renal function. Narrowed abx to augmentin for 5 days. Continue tamiflu to finish BID regimen.  Continue tapering prednisone by 5mg  daily, today 45mg .  PAST MEDICAL HISTORY   Past Medical History:  Diagnosis Date   Acute asthma exacerbation 12/18/2021   Acute respiratory distress 09/26/2014   Acute respiratory failure with hypoxia (HCC) 07/06/2021   Arthritis    Asthma    Asthma exacerbation 10/13/2021   Asthmatic bronchitis with acute exacerbation 09/26/2014    Asthmatic bronchitis with exacerbation 09/26/2014   Breast cancer (HCC)    Cancer of right breast, stage 1, estrogen receptor positive (HCC) 05/15/2019   Formatting of this note might be different from the original. Last Assessment & Plan: Formatting of this note is different from the original. - Resumed home letrozole 2.5 mg daily   CAP (community acquired pneumonia) 07/06/2021   COVID-19 virus infection 03/27/2022   Diabetes mellitus without complication (HCC)    History of breast cancer 11/06/2020   Hypertension    Hypokalemia 10/14/2021   Multifocal pneumonia 05/13/2021   Obesity    Personal history of radiation therapy    Pulmonary hypertension (HCC)    Sepsis (HCC) 07/05/2021   Sleep apnea      SURGICAL HISTORY   Past Surgical History:  Procedure Laterality Date   BREAST LUMPECTOMY Right 05/23/2019   invasive DCIS radation    CATARACT EXTRACTION     CESAREAN SECTION     x 3      FAMILY HISTORY   Family History  Problem Relation Age of Onset   Diabetes Mother    Stroke Mother    Diabetes Father    Diabetes Brother    Cancer Neg Hx    Heart failure Neg Hx    Hypertension Neg Hx    Breast cancer Neg Hx      SOCIAL HISTORY   Social History   Tobacco Use   Smoking status: Former    Average packs/day: 0.5 packs/day for 10.0 years (5.0  ttl pk-yrs)    Types: Cigarettes    Start date: 1982   Smokeless tobacco: Never   Tobacco comments:    Quit smoking 30 years ago.  Vaping Use   Vaping status: Never Used  Substance Use Topics   Alcohol use: No   Drug use: No     MEDICATIONS    Home Medication:    Current Medication:  Current Facility-Administered Medications:    azithromycin (ZITHROMAX) tablet 500 mg, 500 mg, Oral, Daily, [name], [name], [name], 500 mg at 04/08/23 1627   cefTRIAXone (ROCEPHIN) 2 g in sodium chloride 0.9 % 100 mL IVPB, 2 g, Intravenous, Q24H, [name], MD, Stopped at 04/08/23 1658   enoxaparin (LOVENOX) injection 60 mg,  0.5 mg/kg, Subcutaneous, Q24H, [name], MD, 60 mg at 04/08/23 2100   losartan (COZAAR) tablet 50 mg, 50 mg, Oral, Daily, 50 mg at 04/08/23 0943 **AND** hydrochlorothiazide (HYDRODIURIL) tablet 12.5 mg, 12.5 mg, Oral, Daily, [name], [name], [name], 12.5 mg at 04/08/23 0943   insulin aspart (novoLOG) injection 0-15 Units, 0-15 Units, Subcutaneous, TID AC & HS, [name], NP, 3 Units at 04/08/23 2117   insulin aspart (novoLOG) injection 4 Units, 4 Units, Subcutaneous, TID WC, [name], MD, 4 Units at 04/08/23 1807   ipratropium-albuterol (DUONEB) 0.5-2.5 (3) MG/3ML nebulizer solution 3 mL, 3 mL, Nebulization, Q4H PRN, [name], MD   ondansetron (ZOFRAN) tablet 4 mg, 4 mg, Oral, Q6H PRN **OR** ondansetron (ZOFRAN) injection 4 mg, 4 mg, Intravenous, Q6H PRN, [name], MD   Oral care mouth rinse, 15 mL, Mouth Rinse, PRN, [name], MD   oseltamivir (TAMIFLU) capsule 75 mg, 75 mg, Oral, BID, [name], MD, 75 mg at 04/08/23 2100   predniSONE (DELTASONE) tablet 50 mg, 50 mg, Oral, Q breakfast, [name], MD, 50 mg at 04/08/23 4098    ALLERGIES   Patient has no known allergies.     REVIEW OF SYSTEMS    Review of Systems:  Gen:  Denies  fever, sweats, chills weigh loss  HEENT: Denies blurred vision, double vision, ear pain, eye pain, hearing loss, nose bleeds, sore throat Cardiac:  No dizziness, chest pain or heaviness, chest tightness,edema Resp:   reports dyspnea chronically  Gi: Denies swallowing difficulty, stomach pain, nausea or vomiting, diarrhea, constipation, bowel incontinence Gu:  Denies bladder incontinence, burning urine Ext:   Denies Joint pain, stiffness or swelling Skin: Denies  skin rash, easy bruising or bleeding or hives Endoc:  Denies polyuria, polydipsia , polyphagia or weight change Psych:   Denies depression, insomnia or hallucinations   Other:  All other systems negative   VS: BP (!) 148/77 (BP Location: Left  Arm)   Pulse 74   Temp 97.9 F (36.6 C) (Oral)   Resp 20   Ht 5\' 5"  (1.651 m)   Wt 117.9 kg   SpO2 98%   BMI 43.27 kg/m      PHYSICAL EXAM    GENERAL:NAD, no fevers, chills, no weakness no fatigue HEAD: Normocephalic, atraumatic.  EYES: Pupils equal, round, reactive to light. Extraocular muscles intact. No scleral icterus.  MOUTH: Moist mucosal membrane. Dentition intact. No abscess noted.  EAR, NOSE, THROAT: Clear without exudates. No external lesions.  NECK: Supple. No thyromegaly. No nodules. No JVD.  PULMONARY: decreased breath sounds with mild rhonchi worse at bases bilaterally.  CARDIOVASCULAR: S1 and S2. Regular rate and rhythm. No murmurs, rubs, or gallops. No edema. Pedal pulses 2+ bilaterally.  GASTROINTESTINAL: Soft, nontender, nondistended. No masses. Positive bowel sounds. No hepatosplenomegaly.  MUSCULOSKELETAL: No swelling, clubbing, or edema. Range of motion full in all extremities.  NEUROLOGIC: Cranial nerves II through XII are intact. No gross focal neurological deficits. Sensation intact. Reflexes intact.  SKIN: No ulceration, lesions, rashes, or cyanosis. Skin warm and dry. Turgor intact.  PSYCHIATRIC: Mood, affect within normal limits. The patient is awake, alert and oriented x 3. Insight, judgment intact.       IMAGING   Narrative & Impression  CLINICAL DATA:  Respiratory illness, nondiagnostic xray   EXAM: CT CHEST WITHOUT CONTRAST   TECHNIQUE: Multidetector CT imaging of the chest was performed following the standard protocol without IV contrast.   RADIATION DOSE REDUCTION: This exam was performed according to the departmental dose-optimization program which includes automated exposure control, adjustment of the mA and/or kV according to patient size and/or use of iterative reconstruction technique.   COMPARISON:  X-ray 04/07/2023, CT 12/19/2021   FINDINGS: Cardiovascular: Normal heart size. No pericardial effusion. Mid ascending thoracic  aorta measures 4.2 cm in diameter (previously 3.8 cm). Pulmonary trunk measures 3.6 cm in diameter.   Mediastinum/Nodes: No pathologically enlarged axillary or mediastinal lymph nodes. Evaluation of the hilar structures is limited in the absence of intravenous contrast. Within this limitation, no obvious hilar adenopathy or mass is identified. Thyroid gland, trachea, and esophagus within normal limits.   Lungs/Pleura: Partial atelectasis/consolidation of the right middle lobe. There is some debris within the right middle lobe bronchioles. Bronchus intermedius is patent. The remaining lung fields are otherwise clear. No pleural effusion or pneumothorax.   Upper Abdomen: No acute abnormality.   Musculoskeletal: No chest wall mass or suspicious bone lesions identified.   IMPRESSION: 1. Partial atelectasis/consolidation of the right middle lobe with some debris within the right middle lobe bronchioles. Findings may be secondary to mucous plugging. Radiographic follow-up is recommended to ensure resolution to exclude the possibility of a endobronchial lesion. 2. Ascending thoracic aortic aneurysm measuring 4.2 cm. Recommend annual imaging followup by CTA or MRA. This recommendation follows 2010 ACCF/AHA/AATS/ACR/ASA/SCA/SCAI/SIR/STS/SVM Guidelines for the Diagnosis and Management of Patients with Thoracic Aortic Disease. Circulation. 2010; 121: Z610-R604. Aortic aneurysm NOS (ICD10-I71.9)     Electronically Signed   By: Duanne Guess D.O.   On: 04/07/2023 16:19     ASSESSMENT/PLAN    -Acute exacerbation of severe persistent asthma   - Patient on Fasenra injections on outpatient   - she reports insurance non coverage and possibly skipping dosing of Fasenra   -patient currently on steroids and antibiotics with nebulizer therapy   -she has received solumedrol and is now on prednisone   - she is on rocephin and zithromax have narrowed to augmentin   - we can change to  augmentin tommorow with plan for 5-7 day course   - recommend to reduce prednisone by 5mg  daily currently on 45mg       Atelectasis worse at RML   - have discussed with patient use of IS and flutter valve   -she is compliant and using it each hour            -repeat CXR in process  Mucus plugging of tracheobronchial tree    - currently patient is being treated with non invasive means     - she is using flutter valve to help expectorate mucus     - we discussed potentially needing airway inspection and therapeutic aspiration if this continues and persists despite full scope of medical therapy  Influenza A    - currently on Tamiflu     - patient is improving daily and is ambulatory now but still on oxygen.  She does have diarreah but its also improving       Thank you for allowing me to participate in the care of this patient.   Patient/Family are satisfied with care plan and all questions have been answered.    Provider disclosure: Patient with at least one acute or chronic illness or injury that poses a threat to life or bodily function and is being managed actively during this encounter.  All of the below services have been performed independently by signing provider:  review of prior documentation from internal and or external health records.  Review of previous and current lab results.  Interview and comprehensive assessment during patient visit today. Review of current and previous chest radiographs/CT scans. Discussion of management and test interpretation with health care team and patient/family.   This document was prepared using Dragon voice recognition software and may include unintentional dictation errors.     Vida Rigger, M.D.  Division of Pulmonary & Critical Care Medicine

## 2023-04-09 NOTE — Consult Note (Signed)
PHARMACY CONSULT NOTE - ELECTROLYTES  Pharmacy Consult for Electrolyte Monitoring and Replacement   Recent Labs: Potassium (mmol/L)  Date Value  04/09/2023 3.4 (L)  07/15/2013 4.1   Magnesium (mg/dL)  Date Value  40/98/1191 2.0  03/04/2012 1.8   Calcium (mg/dL)  Date Value  47/82/9562 9.1   Calcium, Total (mg/dL)  Date Value  13/09/6576 9.1   Albumin (g/dL)  Date Value  46/96/2952 3.8  10/24/2022 4.1  04/18/2011 4.0   Phosphorus (mg/dL)  Date Value  84/13/2440 4.1   Sodium (mmol/L)  Date Value  04/09/2023 139  10/24/2022 143  07/15/2013 138   Height: 5\' 5"  (165.1 cm) Weight: 117.9 kg (260 lb) IBW/kg (Calculated) : 57 Estimated Creatinine Clearance: 92.5 mL/min (by C-G formula based on SCr of 0.79 mg/dL).  Assessment  Nancy Patton is a 63 y.o. female presenting with respiratory failure secondary to influenza A and asthma exacerbation. PMH significant for obesity, asthma, hypertension, hyperlipidemia, type 2 diabetes, breast cancer . Pharmacy has been consulted to monitor and replace electrolytes.  Diet: Heart healthy / carb modified MIVF: N/A Pertinent medications: Hydrochlorothiazide 12.5 mg daily. Ordered Lasix 40 mg IV x 1 2/17  Goal of Therapy: Electrolytes within normal limits  Plan:  K 3.4, ordered Kcl 40 mEq PO x 1 dose Follow-up electrolytes in AM  Thank you for allowing pharmacy to be a part of this patient's care.  Tressie Ellis 04/09/2023 5:36 PM

## 2023-04-09 NOTE — Plan of Care (Signed)
  Problem: Education: Goal: Ability to describe self-care measures that may prevent or decrease complications (Diabetes Survival Skills Education) will improve Outcome: Progressing   Problem: Fluid Volume: Goal: Ability to maintain a balanced intake and output will improve Outcome: Progressing   Problem: Tissue Perfusion: Goal: Adequacy of tissue perfusion will improve Outcome: Progressing   Problem: Respiratory: Goal: Ability to maintain adequate ventilation will improve Outcome: Progressing   Problem: Respiratory: Goal: Ability to maintain a clear airway will improve Outcome: Progressing

## 2023-04-09 NOTE — Evaluation (Signed)
Physical Therapy Evaluation Patient Details Name: Nancy Patton MRN: [id number] DOB: 1960/11/25 Today's Date: 04/09/2023  History of Present Illness  Pt is a 63 y.o. female presenting to hospital 04/07/23 with c/o SOB; recent diagnosis of influenza.  Pt admitted with acute respiratory failure with hypoxia, asthma exacerbation, suspected PNA, (+) influenza A.  PMH includes DM, htn, sleep apnea, asthma, breast CA, s/p R breast lumpectomy.  Clinical Impression  Prior to recent medical concerns, pt was independent with functional mobility; lives with a room-mate in 1 level home.  No c/o pain during session.  Currently pt is independent with bed mobility, independent with transfer, and SBA (for O2 tank management) ambulating in hallway (no AD use).  Pt's SpO2 sats 92% at rest (on extended O2 tubing d/t needing to be long enough to get to/from bathroom) and 93-94% post ambulation (on 1 length of regular supplemental O2 tubing).  Mild SOB noted with ambulation; pt educated on pursed lip breathing and did well practicing this during sessions activities.  Pt appearing steady and safe with functional mobility during sessions activities.  Pt's MD and nurse updated on pt's status and O2 sats/O2 use during session.  No acute PT needs identified; will sign off; discussed with pt who was in agreement; MD notified.    If plan is discharge home, recommend the following: Assist for transportation   Can travel by private vehicle    Yes    Equipment Recommendations None recommended by PT  Recommendations for Other Services       Functional Status Assessment Patient has not had a recent decline in their functional status     Precautions / Restrictions Precautions Precautions: None Restrictions Weight Bearing Restrictions Per Provider Order: No      Mobility  Bed Mobility Overal bed mobility: Independent                  Transfers Overall transfer level: Independent Equipment used:  None               General transfer comment: steady transfer from bed    Ambulation/Gait Ambulation/Gait assistance: Supervision (SBA for O2 tubing/tank management) Gait Distance (Feet): 200 Feet Assistive device: None Gait Pattern/deviations: WFL(Within Functional Limits), Step-through pattern Gait velocity: mildly decreased     General Gait Details: steady ambulation; vc's for pursed lip breathing  Stairs            Wheelchair Mobility     Tilt Bed    Modified Rankin (Stroke Patients Only)       Balance Overall balance assessment: Needs assistance Sitting-balance support: No upper extremity supported, Feet supported Sitting balance-Leahy Scale: Normal Sitting balance - Comments: steady reaching outside BOS   Standing balance support: No upper extremity supported, During functional activity Standing balance-Leahy Scale: Normal Standing balance comment: steady ambulation                             Pertinent Vitals/Pain Pain Assessment Pain Assessment: No/denies pain HR 71 bpm post ambulation.    Home Living Family/patient expects to be discharged to:: Private residence Living Arrangements: Non-relatives/Friends (Room-mate)   Type of Home: House Home Access: Stairs to enter Entrance Stairs-Rails: Doctor, general practice of Steps: 2-3   Home Layout: One level Home Equipment: None      Prior Function Prior Level of Function : Independent/Modified Independent             Mobility Comments: (+) working; no  recent falls       Extremity/Trunk Assessment   Upper Extremity Assessment Upper Extremity Assessment: Overall WFL for tasks assessed    Lower Extremity Assessment Lower Extremity Assessment: Overall WFL for tasks assessed    Cervical / Trunk Assessment Cervical / Trunk Assessment: Normal  Communication   Communication Communication: No apparent difficulties    Cognition Arousal: Alert Behavior During  Therapy: WFL for tasks assessed/performed   PT - Cognitive impairments: No apparent impairments                         Following commands: Intact       Cueing Cueing Techniques: Verbal cues     General Comments  Nursing cleared pt for participation in physical therapy.  Pt agreeable to PT session.    Exercises     Assessment/Plan    PT Assessment Patient does not need any further PT services  PT Problem List         PT Treatment Interventions      PT Goals (Current goals can be found in the Care Plan section)  Acute Rehab PT Goals Patient Stated Goal: to improve breathing PT Goal Formulation: With patient Time For Goal Achievement: 04/23/23 Potential to Achieve Goals: Good    Frequency       Co-evaluation               AM-PAC PT "6 Clicks" Mobility  Outcome Measure Help needed turning from your back to your side while in a flat bed without using bedrails?: None Help needed moving from lying on your back to sitting on the side of a flat bed without using bedrails?: None Help needed moving to and from a bed to a chair (including a wheelchair)?: None Help needed standing up from a chair using your arms (e.g., wheelchair or bedside chair)?: None Help needed to walk in hospital room?: None Help needed climbing 3-5 steps with a railing? : None 6 Click Score: 24    End of Session Equipment Utilized During Treatment: Oxygen (4 L via nasal cannula) Activity Tolerance: Patient tolerated treatment well Patient left: in bed;with call bell/phone within reach Nurse Communication: Mobility status;Precautions;Other (comment) (Pt's SpO2 sats during session) PT Visit Diagnosis: Muscle weakness (generalized) (M62.81)    Time: 1914-7829 PT Time Calculation (min) (ACUTE ONLY): 18 min   Charges:   PT Evaluation $PT Eval Low Complexity: 1 Low PT Treatments $Therapeutic Activity: 8-22 mins PT General Charges $$ ACUTE PT VISIT: 1 Visit       Hendricks Limes, PT 04/09/23, 12:03 PM

## 2023-04-09 NOTE — Progress Notes (Signed)
1      PROGRESS NOTE    Nancy Patton  ZOX:[id number] DOB: [birth year] DOA: 04/07/2023 PCP: Eden Emms, NP    Brief Narrative:    63 y.o. female with medical history significant of obesity, asthma, hypertension, hyperlipidemia, type 2 diabetes, breast cancer presenting with acute respiratory failure with hypoxia, influenza A, asthma exacerbation.  2/16: Pulmonary consult 2/17: IV Lasix, PT, OT eval, CXR   Assessment & Plan:   Active Problems:   Acute respiratory failure with hypoxia (HCC)   Essential hypertension   Controlled type 2 diabetes mellitus without complication, without long-term current use of insulin (HCC)   Cancer of right breast, stage 1, estrogen receptor positive (HCC)  Acute respiratory failure with hypoxia (HCC) Asthma Exacerbation  Community-acquired PNA Decompensated respiratory failure requiring 2 to 3 L nasal cannula in the setting of influenza A, asthma,?  Lobar pneumonia Noted?  Middle right lobe lung collapse versus atelectasis Influenza A positive IV steroids given initially.  Now transitioned to oral prednisone, slow 5 mg taper Continue Tamiflu Change Rocephin and azithromycin to PO Augmentin As needed DuoNebs Supplemental oxygen CT Chest shows partial atelectasis/consolidation of the right middle lobe likely mucous plugging. CXR is unchanged, none acute Continue incentive spirometry, flutter valve and chest PT with vest to help with mucus Lasix 40 mg IV once   Essential hypertension BP stable  Titrate home regimen     Cancer of right breast, stage 1, estrogen receptor positive (HCC) S/p radiation and lumpectomy  On letrozole      Controlled type 2 diabetes mellitus without complication, without long-term current use of insulin (HCC) Blood sugar controlled SSI  Monitor blood sugar w/ steroid use    DVT prophylaxis: Lovenox      Code Status: Full code Family Communication: None at bedside Disposition Plan: Possible  discharge in next 1 to 2 days depending on clinical condition   Consultants:  Pulmonary   Antimicrobials:  Augmentin   Subjective:  remains congested and coughing. Still on 4 liters O2  Objective: Vitals:   04/09/23 0346 04/09/23 0922 04/09/23 1130 04/09/23 1708  BP: (!) 148/77 (!) 148/96  (!) 147/96  Pulse: 74 84  73  Resp: 20 20  20   Temp: 97.9 F (36.6 C) 98.9 F (37.2 C)  98.3 F (36.8 C)  TempSrc: Oral Oral  Oral  SpO2: 98%  94% 96%  Weight:      Height:        Intake/Output Summary (Last 24 hours) at 04/09/2023 1731 Last data filed at 04/08/2023 1824 Gross per 24 hour  Intake 400 ml  Output --  Net 400 ml   Filed Weights   04/07/23 1129  Weight: 117.9 kg    Examination:  General exam: Appears calm and comfortable  Respiratory system: Rhonchi at the bases mild expiratory wheezing bilaterally Cardiovascular system: S1 & S2 heard, RRR. No JVD, murmurs. No pedal edema. Gastrointestinal system: Abdomen is soft, benign Central nervous system: Alert and oriented. No focal neurological deficits. Extremities: Symmetric 5 x 5 power. Skin: No rashes, lesions or ulcers Psychiatry: Judgement and insight appear normal. Mood & affect appropriate.     Data Reviewed: I have personally reviewed following labs and imaging studies  CBC: Recent Labs  Lab 04/07/23 1130 04/08/23 0627 04/09/23 0537  WBC 6.6 5.7 7.5  HGB 12.6 13.0 13.6  HCT 39.5 39.8 41.3  MCV 88.6 86.5 86.6  PLT 186 219 212   Basic Metabolic Panel: Recent Labs  Lab  04/07/23 1130 04/08/23 0627 04/09/23 0537  NA 138 139 139  K 4.0 3.8 3.4*  CL 104 104 103  CO2 24 25 27   GLUCOSE 260* 134* 88  BUN 15 16 19   CREATININE 1.12* 0.77 0.79  CALCIUM 8.6* 9.2 9.1   GFR: Estimated Creatinine Clearance: 92.5 mL/min (by C-G formula based on SCr of 0.79 mg/dL). Liver Function Tests: Recent Labs  Lab 04/08/23 0627  AST 21  ALT 21  ALKPHOS 60  BILITOT 0.7  PROT 7.1  ALBUMIN 3.8     CBG: Recent Labs  Lab 04/08/23 1707 04/08/23 2056 04/09/23 0914 04/09/23 1345 04/09/23 1709  GLUCAP 235* 163* 124* 167* 79    Recent Results (from the past 240 hours)  Resp panel by RT-PCR (RSV, Flu A&B, Covid) Anterior Nasal Swab     Status: Abnormal   Collection Time: 04/07/23 11:32 AM   Specimen: Anterior Nasal Swab  Result Value Ref Range Status   SARS Coronavirus 2 by RT PCR NEGATIVE NEGATIVE Final    Comment: (NOTE) SARS-CoV-2 target nucleic acids are NOT DETECTED.  The SARS-CoV-2 RNA is generally detectable in upper respiratory specimens during the acute phase of infection. The lowest concentration of SARS-CoV-2 viral copies this assay can detect is 138 copies/mL. A negative result does not preclude SARS-Cov-2 infection and should not be used as the sole basis for treatment or other patient management decisions. A negative result may occur with  improper specimen collection/handling, submission of specimen other than nasopharyngeal swab, presence of viral mutation(s) within the areas targeted by this assay, and inadequate number of viral copies(<138 copies/mL). A negative result must be combined with clinical observations, patient history, and epidemiological information. The expected result is Negative.  Fact Sheet for Patients:  BloggerCourse.com  Fact Sheet for Healthcare Providers:  SeriousBroker.it  This test is no t yet approved or cleared by the [place] FDA and  has been authorized for detection and/or diagnosis of SARS-CoV-2 by FDA under an Emergency Use Authorization (EUA). This EUA will remain  in effect (meaning this test can be used) for the duration of the COVID-19 declaration under Section 564(b)(1) of the Act, 21 U.S.C.section 360bbb-3(b)(1), unless the authorization is terminated  or revoked sooner.       Influenza A by PCR POSITIVE (A) NEGATIVE Final   Influenza B by PCR NEGATIVE  NEGATIVE Final    Comment: (NOTE) The Xpert Xpress SARS-CoV-2/FLU/RSV plus assay is intended as an aid in the diagnosis of influenza from Nasopharyngeal swab specimens and should not be used as a sole basis for treatment. Nasal washings and aspirates are unacceptable for Xpert Xpress SARS-CoV-2/FLU/RSV testing.  Fact Sheet for Patients: BloggerCourse.com  Fact Sheet for Healthcare Providers: SeriousBroker.it  This test is not yet approved or cleared by the [place] FDA and has been authorized for detection and/or diagnosis of SARS-CoV-2 by FDA under an Emergency Use Authorization (EUA). This EUA will remain in effect (meaning this test can be used) for the duration of the COVID-19 declaration under Section 564(b)(1) of the Act, 21 U.S.C. section 360bbb-3(b)(1), unless the authorization is terminated or revoked.     Resp Syncytial Virus by PCR NEGATIVE NEGATIVE Final    Comment: (NOTE) Fact Sheet for Patients: BloggerCourse.com  Fact Sheet for Healthcare Providers: SeriousBroker.it  This test is not yet approved or cleared by the [place] FDA and has been authorized for detection and/or diagnosis of SARS-CoV-2 by FDA under an Emergency Use Authorization (EUA). This EUA will remain in  effect (meaning this test can be used) for the duration of the COVID-19 declaration under Section 564(b)(1) of the Act, 21 U.S.C. section 360bbb-3(b)(1), unless the authorization is terminated or revoked.  Performed at [facility], [address] [place], [place] [address]   MRSA Next Gen by PCR, Nasal     Status: None   Collection Time: 04/07/23  8:00 PM   Specimen: Nasal Mucosa; Nasal Swab  Result Value Ref Range Status   MRSA by PCR Next Gen NOT DETECTED NOT DETECTED Final    Comment: (NOTE) The GeneXpert MRSA Assay (FDA approved for NASAL specimens only), is one  component of a comprehensive MRSA colonization surveillance program. It is not intended to diagnose MRSA infection nor to guide or monitor treatment for MRSA infections. Test performance is not FDA approved in patients less than 8 years old. Performed at [facility], [address], [place], [place] [address]          Radiology Studies: DG Chest [place] 1 View Result Date: 04/09/2023 CLINICAL DATA:  Shortness of breath, influenza EXAM: PORTABLE CHEST 1 VIEW COMPARISON:  04/07/2023 FINDINGS: The heart size and mediastinal contours are within normal limits. Unchanged atelectasis or consolidation of the right middle lobe. Disc degenerative disease of the thoracic spine. IMPRESSION: No acute abnormality of the lungs. Unchanged atelectasis or consolidation of the right middle lobe. Electronically Signed   By: Jearld Lesch M.D.   On: 04/09/2023 12:15        Scheduled Meds:  amoxicillin-clavulanate  1 tablet Oral Q12H   enoxaparin (LOVENOX) injection  0.5 mg/kg Subcutaneous Q24H   furosemide  40 mg Intravenous Once   losartan  50 mg Oral Daily   And   hydrochlorothiazide  12.5 mg Oral Daily   insulin aspart  0-15 Units Subcutaneous TID AC & HS   insulin aspart  4 Units Subcutaneous TID WC   oseltamivir  75 mg Oral BID   [START ON 04/10/2023] predniSONE  45 mg Oral Q breakfast   Continuous Infusions:     LOS: 2 days    Time spent: 35 minutes    [name], MD [facility] Pager [phone]  If 7PM-7AM, please contact night-coverage [email or web address]  04/09/2023, 5:31 PM

## 2023-04-09 NOTE — Evaluation (Signed)
Occupational Therapy Evaluation Patient Details Name: Nancy Patton MRN: [id number] DOB: 08/04/1960 Today's Date: 04/09/2023   History of Present Illness   Pt is a 62 y.o. female presenting to hospital 04/07/23 with c/o SOB; recent diagnosis of influenza.  Pt admitted with acute respiratory failure with hypoxia, asthma exacerbation, suspected PNA, (+) influenza A.  PMH includes DM, htn, sleep apnea, asthma, breast CA, s/p R breast lumpectomy.     Clinical Impressions Pt in bed upon OT arrival and agreeable to OT evaluation.  Educ provided on EC strategies with ADLs and mobility, PLB, and diaphragmatic breathing; visual handouts provided.  Pt demos independence with bathroom mobility and good implementation of EC strategies.  02 sats 97% at rest, dropping to mid 80s with bathroom transfer with 02 in place, though quick return to low 90s with vc for PLB while sitting EOB.  Completed 3 reps on flutter valve and 10 reps on incentive spirometer with initial vc for technique and educ on benefits of each device. Encouraged goal to use both devices hourly while hospitalized to promote optimal lung function.  Pt verbalized understanding.  Do not anticipate any AE needs upon d/c.  No OT follow up needed at this time.  Goals met at eval.  Pt in agreement with plan.      If plan is discharge home, recommend the following:         Functional Status Assessment   Patient has had a recent decline in their functional status and demonstrates the ability to make significant improvements in function in a reasonable and predictable amount of time.     Equipment Recommendations   None recommended by OT     Recommendations for Other Services         Precautions/Restrictions   Precautions Precautions: None Restrictions Weight Bearing Restrictions Per Provider Order: No     Mobility Bed Mobility Overal bed mobility: Independent               Patient Response:  Cooperative  Transfers Overall transfer level: Independent Equipment used: None               General transfer comment: indep without AD      Balance Overall balance assessment: Independent Sitting-balance support: No upper extremity supported, Feet supported Sitting balance-Leahy Scale: Normal     Standing balance support: No upper extremity supported, During functional activity Standing balance-Leahy Scale: Normal                             ADL either performed or assessed with clinical judgement   ADL Overall ADL's : Modified independent                                       General ADL Comments: extra time to implement pacing strategies     Vision Patient Visual Report: No change from baseline                         Pertinent Vitals/Pain Pain Assessment Pain Assessment: No/denies pain     Extremity/Trunk Assessment Upper Extremity Assessment Upper Extremity Assessment: Overall WFL for tasks assessed   Lower Extremity Assessment Lower Extremity Assessment: Overall WFL for tasks assessed   Cervical / Trunk Assessment Cervical / Trunk Assessment: Normal   Communication Communication Communication: No apparent difficulties  Cognition Arousal: Alert Behavior During Therapy: WFL for tasks assessed/performed                                 Following commands: Intact       Cueing  General Comments   Cueing Techniques: Verbal cues      Exercises Other Exercises Other Exercises: Educ on OT role, goals, poc; Educ provided on EC strategies, PLB, diaphragmatic breathing.  Completed 3 reps on flutter valve and 10 reps on incentive spirometer with initial vc for technique.  Reviewed benefits of these devices and goal to use hourly while hospitalized.   Shoulder Instructions      Home Living Family/patient expects to be discharged to:: Private residence Living Arrangements:  Non-relatives/Friends;Other (Comment) (roommate) Available Help at Discharge: Available PRN/intermittently Type of Home: House Home Access: Stairs to enter [employer] of Steps: 2-3 Entrance Stairs-Rails: Right;Left Home Layout: One level     Bathroom Shower/Tub: Chief Strategy Officer: Standard     Home Equipment: None          Prior Functioning/Environment Prior Level of Function : Independent/Modified Independent             Mobility Comments: works at [employer]; prolonged standing required ADLs Comments: indep without AE    OT Problem List: Decreased activity tolerance;Cardiopulmonary status limiting activity   OT Treatment/Interventions:        OT Goals(Current goals can be found in the care plan section)   Acute Rehab OT Goals Patient Stated Goal: To go home OT Goal Formulation: With patient Time For Goal Achievement: 04/23/23 Potential to Achieve Goals: Good   OT Frequency:                     AM-PAC OT "6 Clicks" Daily Activity     Outcome Measure Help from another person eating meals?: None Help from another person taking care of personal grooming?: None Help from another person toileting, which includes using toliet, bedpan, or urinal?: None Help from another person bathing (including washing, rinsing, drying)?: None Help from another person to put on and taking off regular upper body clothing?: None Help from another person to put on and taking off regular lower body clothing?: None 6 Click Score: 24   End of Session    Activity Tolerance: Patient tolerated treatment well Patient left: in bed  OT Visit Diagnosis: Muscle weakness (generalized) (M62.81)                Time: 1610-9604 OT Time Calculation (min): 29 min Charges:  OT General Charges $OT Visit: 1 Visit OT Evaluation $OT Eval Moderate Complexity: 1 Mod OT Treatments $Self Care/Home Management : 8-22 mins Danelle Earthly, MS, OTR/L [place] Dials 04/09/2023, 3:50 PM

## 2023-04-10 ENCOUNTER — Encounter: Payer: Self-pay | Admitting: Family Medicine

## 2023-04-10 ENCOUNTER — Other Ambulatory Visit: Payer: Self-pay

## 2023-04-10 DIAGNOSIS — J4550 Severe persistent asthma, uncomplicated: Secondary | ICD-10-CM

## 2023-04-10 DIAGNOSIS — J101 Influenza due to other identified influenza virus with other respiratory manifestations: Secondary | ICD-10-CM | POA: Diagnosis not present

## 2023-04-10 DIAGNOSIS — J9601 Acute respiratory failure with hypoxia: Secondary | ICD-10-CM | POA: Diagnosis not present

## 2023-04-10 DIAGNOSIS — J45901 Unspecified asthma with (acute) exacerbation: Secondary | ICD-10-CM | POA: Diagnosis not present

## 2023-04-10 LAB — BASIC METABOLIC PANEL
Anion gap: 10 (ref 5–15)
BUN: 23 mg/dL (ref 8–23)
CO2: 28 mmol/L (ref 22–32)
Calcium: 9.3 mg/dL (ref 8.9–10.3)
Chloride: 101 mmol/L (ref 98–111)
Creatinine, Ser: 0.92 mg/dL (ref 0.44–1.00)
GFR, Estimated: >60 mL/min (ref >60)
Glucose, Bld: 103 mg/dL — ABNORMAL HIGH (ref 70–99)
Potassium: 3.7 mmol/L (ref 3.5–5.1)
Sodium: 139 mmol/L (ref 135–145)

## 2023-04-10 LAB — CBC
HCT: 41.7 % (ref 36.0–46.0)
Hemoglobin: 13.8 g/dL (ref 12.0–15.0)
MCH: 28.4 pg (ref 26.0–34.0)
MCHC: 33.1 g/dL (ref 30.0–36.0)
MCV: 85.8 fL (ref 80.0–100.0)
Platelets: 210 10*3/uL (ref 150–400)
RBC: 4.86 MIL/uL (ref 3.87–5.11)
RDW: 12.9 % (ref 11.5–15.5)
WBC: 6 10*3/uL (ref 4.0–10.5)
nRBC: 0 % (ref 0.0–0.2)

## 2023-04-10 LAB — GLUCOSE, CAPILLARY
Glucose-Capillary: 174 mg/dL — ABNORMAL HIGH (ref 70–99)
Glucose-Capillary: 121 mg/dL — ABNORMAL HIGH (ref 70–99)
Glucose-Capillary: 324 mg/dL — ABNORMAL HIGH (ref 70–99)

## 2023-04-10 LAB — MAGNESIUM: Magnesium: 1.9 mg/dL (ref 1.7–2.4)

## 2023-04-10 MED ORDER — PREDNISONE 5 MG PO TABS
ORAL_TABLET | ORAL | 0 refills | Status: AC
  Filled 2023-04-10: qty 36, 8d supply, fill #0

## 2023-04-10 MED ORDER — FUROSEMIDE 10 MG/ML IJ SOLN
40.0000 mg | Freq: Once | INTRAMUSCULAR | Status: AC
  Administered 2023-04-10: 40 mg via INTRAVENOUS
  Filled 2023-04-10: qty 4

## 2023-04-10 MED ORDER — AMOXICILLIN-POT CLAVULANATE 875-125 MG PO TABS
1.0000 | ORAL_TABLET | Freq: Two times a day (BID) | ORAL | 0 refills | Status: AC
  Filled 2023-04-10: qty 10, 5d supply, fill #0

## 2023-04-10 NOTE — TOC Transition Note (Signed)
Transition of Care [facility]) - Discharge Note   Patient Details  Name: Nancy Patton MRN: [id number] Date of Birth: 09/14/1960  Transition of Care [facility]) CM/SW Contact:  Garret Reddish, RN Phone Number: 04/10/2023, 8:17 PM   Clinical Narrative:    Chart reviewed. Noted that patient has orders for discharge today.  Noted that patient will need home 02 on discharge.    I have asked Cletis Athens with [employer] to provide patient with home 02 for home use.  I have asked Cletis Athens to deliver a portable 02 tank at bedside prior to discharge today.  Patient has no other TOC needs identified.    I have informed staff nurse of the above information.     Final next level of care: Home/Self Care Barriers to Discharge: No Barriers Identified   Patient Goals and CMS Choice   CMS Medicare.gov Compare Post Acute Care list provided to:: Patient Choice offered to / list presented to : Patient      Discharge Placement                    Patient and family notified of of transfer: 04/10/23  Discharge Plan and Services Additional resources added to the After Visit Summary for                  DME Arranged: Oxygen DME Agency: [employer] Date DME Agency Contacted: 04/10/23   Representative spoke with at DME Agency: Cletis Athens            Social Drivers of Health (SDOH) Interventions SDOH Screenings   Food Insecurity: No Food Insecurity (04/07/2023)  Housing: High Risk (04/07/2023)  Transportation Needs: No Transportation Needs (04/07/2023)  Utilities: Not At Risk (04/07/2023)  Depression (PHQ2-9): Medium Risk (03/23/2023)  Financial Resource Strain: Low Risk  (03/22/2023)  Physical Activity: Unknown (03/22/2023)  Social Connections: Moderately Isolated (03/22/2023)  Stress: No Stress Concern Present (03/22/2023)  Tobacco Use: Medium Risk (04/10/2023)     Readmission Risk Interventions    03/29/2022   10:30 AM 12/21/2021   10:31 AM  Readmission Risk Prevention Plan  Transportation Screening  Complete Complete  PCP or Specialist Appt within 3-5 Days Complete   HRI or Home Care Consult Complete Not Complete  HRI or Home Care Consult comments  Not indicated  Social Work Consult for Recovery Care Planning/Counseling  Complete  Palliative Care Screening  Not Applicable  Medication Review Oceanographer) Complete Complete

## 2023-04-10 NOTE — Progress Notes (Signed)
SATURATION QUALIFICATIONS: (This note is used to comply with regulatory documentation for home oxygen)  Patient Saturations on Room Air at Rest = 89%  Patient Saturations on Room Air while Ambulating = 86%  Patient Saturations on 2 Liters of oxygen while Ambulating = 92%  Please briefly explain why patient needs home oxygen: pt o2 sats dropping at rest and with ambulation

## 2023-04-10 NOTE — Plan of Care (Signed)

## 2023-04-10 NOTE — TOC CM/SW Note (Signed)
Transition of Care [facility]) - Inpatient Brief Assessment   Patient Details  Name: Nancy Patton MRN: [id number] Date of Birth: 05-Oct-1960  Transition of Care [facility]) CM/SW Contact:    Chapman Fitch, RN Phone Number: 04/10/2023, 10:21 AM   Clinical Narrative:    Transition of Care (TOC) Screening Note   Patient Details  Name: Nancy Patton Date of Birth: [birth year]   Transition of Care [facility]) CM/SW Contact:    Chapman Fitch, RN Phone Number: 04/10/2023, 10:21 AM    Transition of Care Department (TOC) has reviewed patient and no TOC needs have been identified at this time.If new patient transition needs arise, please place a TOC consult.   Transition of Care Asessment: Insurance and Status: Insurance coverage has been reviewed Patient has primary care physician: Yes     Prior/Current Home Services: No current home services Social Drivers of Health Review: SDOH reviewed no interventions necessary Readmission risk has been reviewed: Yes Transition of care needs: no transition of care needs at this time

## 2023-04-10 NOTE — Discharge Summary (Signed)
Physician Discharge Summary   Patient: Nancy Patton MRN: [id number] DOB: May 03, 1960  Admit date:     04/07/2023  Discharge date: 04/10/23  Discharge Physician: [name]   PCP: Eden Emms, NP   Recommendations at discharge:    F/up with outpt providers as requested  Discharge Diagnoses: Active Problems:   Exacerbation of asthma   Acute respiratory failure with hypoxia (HCC)   Essential hypertension   Controlled type 2 diabetes mellitus without complication, without long-term current use of insulin (HCC)   Cancer of right breast, stage 1, estrogen receptor positive [facility])  Hospital Course: Assessment and Plan:  63 y.o. female with medical history significant of obesity, asthma, hypertension, hyperlipidemia, type 2 diabetes, breast cancer presenting with acute respiratory failure with hypoxia, influenza A, asthma exacerbation.   2/16: Pulmonary consult 2/17: IV Lasix, PT, OT eval, CXR     Assessment & Plan:   Active Problems:   Acute respiratory failure with hypoxia (HCC)   Essential hypertension   Controlled type 2 diabetes mellitus without complication, without long-term current use of insulin (HCC)   Cancer of right breast, stage 1, estrogen receptor positive (HCC)   Acute respiratory failure with hypoxia (HCC) Asthma Exacerbation  Community-acquired PNA Influenza A positive CT Chest shows partial atelectasis/consolidation of the right middle lobe likely mucous plugging. Treated with steroids, Abx, Nebs with good response. Will need 2 liter O2 at DC Outpt pulmo f/up   Essential hypertension Cancer of right breast, stage 1, estrogen receptor positive (HCC) S/p radiation and lumpectomy  On letrozole    Controlled type 2 diabetes mellitus without complication, without long-term current use of insulin (HCC)       Consultants: Pulmo Disposition: Home Diet recommendation:  Discharge Diet Orders (From admission, onward)     Start     Ordered    04/10/23 0000  Diet - low sodium heart healthy        04/10/23 1004           Carb modified diet DISCHARGE MEDICATION: Allergies as of 04/10/2023   No Known Allergies      Medication List     STOP taking these medications    ibuprofen 800 MG tablet Commonly known as: ADVIL       TAKE these medications    albuterol 108 (90 Base) MCG/ACT inhaler Commonly known as: VENTOLIN HFA Inhale 2 puffs into the lungs every 6 (six) hours as needed for wheezing or shortness of breath.   amoxicillin-clavulanate 875-125 MG tablet Commonly known as: AUGMENTIN Take 1 tablet by mouth every 12 (twelve) hours for 5 days.   budesonide 0.25 MG/2ML nebulizer solution Commonly known as: PULMICORT Take 0.25 mg by nebulization in the morning and at bedtime.   citalopram 40 MG tablet Commonly known as: CELEXA TAKE 1 TABLET BY MOUTH EVERY DAY   Fasenra Pen 30 MG/ML prefilled autoinjector Generic drug: benralizumab Inject 30 mg into the skin every 8 (eight) weeks.   ipratropium-albuterol 0.5-2.5 (3) MG/3ML Soln Commonly known as: DUONEB INHALE 1 VIAL VIA NEBULIZER THREE TIMES DAILY AS NEEDED   letrozole 2.5 MG tablet Commonly known as: FEMARA TAKE 1 TABLET BY MOUTH EVERY DAY   losartan-hydrochlorothiazide 50-12.5 MG tablet Commonly known as: HYZAAR TAKE 1 TABLET BY MOUTH EVERY DAY   montelukast 10 MG tablet Commonly known as: SINGULAIR TAKE 1 TABLET BY MOUTH EVERY DAY   oseltamivir 75 MG capsule Commonly known as: TAMIFLU Take 1 capsule (75 mg total) by mouth 2 (two) times  daily.   predniSONE 5 MG tablet Commonly known as: DELTASONE Take 8 tablets (40 mg total) by mouth daily for 1 day, THEN 7 tablets (35 mg total) daily for 1 day, THEN 6 tablets (30 mg total) daily for 1 day, THEN 5 tablets (25 mg total) daily for 1 day, THEN 4 tablets (20 mg total) daily for 1 day, THEN 3 tablets (15 mg total) daily for 1 day, THEN 2 tablets (10 mg total) daily for 1 day, THEN 1 tablet (5 mg  total) daily for 1 day. Start taking on: April 10, 2023 What changed:  medication strength See the new instructions.   Trelegy Ellipta 100-62.5-25 MCG/ACT Aepb Generic drug: Fluticasone-Umeclidin-Vilant Take 1 puff by mouth daily.   Trulicity 0.75 MG/0.5ML Soaj Generic drug: Dulaglutide Inject 0.75 mg into the skin once a week.   Vitamin D3 1.25 MG (50000 UT) Caps TAKE 1 CAPSULE BY MOUTH ONE TIME PER WEEK               Durable Medical Equipment  (From admission, onward)           Start     Ordered   04/10/23 1003  DME Oxygen  Once       Question Answer Comment  Length of Need 6 Months   Mode or (Route) Nasal cannula   Liters per Minute 2   Oxygen delivery system Gas      04/10/23 1004            Follow-up Information     Eden Emms, NP. Go on 04/12/2023.   Specialties: Nurse Practitioner, Family Medicine Why: [facility] Discharge F/UP. Go at 12:00pm. You will have a copay  of 50.00 dollars. Contact information: [address] [place] [address] [phone]         Vida Rigger, MD. Go on 04/24/2023.   Specialty: Pulmonary Disease Why: [facility] Discharge F/UP. Go at 9:30am. Contact information: [address] [place] [address] [phone]                Discharge Exam: Ceasar Mons Weights   04/07/23 1129  Weight: 117.9 kg   General exam: Appears calm and comfortable  Respiratory system: CTA b/l Cardiovascular system: S1 & S2 heard, RRR. No JVD, murmurs. No pedal edema. Gastrointestinal system: Abdomen is soft, benign Central nervous system: Alert and oriented. No focal neurological deficits. Extremities: Symmetric 5 x 5 power. Skin: No rashes, lesions or ulcers Psychiatry: Judgement and insight appear normal. Mood & affect appropriate.   Condition at discharge: fair  The results of significant diagnostics from this hospitalization (including imaging, microbiology, ancillary and laboratory) are  listed below for reference.   Imaging Studies: DG Chest Port 1 View Result Date: 04/09/2023 CLINICAL DATA:  Shortness of breath, influenza EXAM: PORTABLE CHEST 1 VIEW COMPARISON:  04/07/2023 FINDINGS: The heart size and mediastinal contours are within normal limits. Unchanged atelectasis or consolidation of the right middle lobe. Disc degenerative disease of the thoracic spine. IMPRESSION: No acute abnormality of the lungs. Unchanged atelectasis or consolidation of the right middle lobe. Electronically Signed   By: Jearld Lesch M.D.   On: 04/09/2023 12:15   CT CHEST WO CONTRAST Result Date: 04/07/2023 CLINICAL DATA:  Respiratory illness, nondiagnostic xray EXAM: CT CHEST WITHOUT CONTRAST TECHNIQUE: Multidetector CT imaging of the chest was performed following the standard protocol without IV contrast. RADIATION DOSE REDUCTION: This exam was performed according to the departmental dose-optimization program which includes automated exposure  control, adjustment of the mA and/or kV according to patient size and/or use of iterative reconstruction technique. COMPARISON:  X-ray 04/07/2023, CT 12/19/2021 FINDINGS: Cardiovascular: Normal heart size. No pericardial effusion. Mid ascending thoracic aorta measures 4.2 cm in diameter (previously 3.8 cm). Pulmonary trunk measures 3.6 cm in diameter. Mediastinum/Nodes: No pathologically enlarged axillary or mediastinal lymph nodes. Evaluation of the hilar structures is limited in the absence of intravenous contrast. Within this limitation, no obvious hilar adenopathy or mass is identified. Thyroid gland, trachea, and esophagus within normal limits. Lungs/Pleura: Partial atelectasis/consolidation of the right middle lobe. There is some debris within the right middle lobe bronchioles. Bronchus intermedius is patent. The remaining lung fields are otherwise clear. No pleural effusion or pneumothorax. Upper Abdomen: No acute abnormality. Musculoskeletal: No chest wall mass or  suspicious bone lesions identified. IMPRESSION: 1. Partial atelectasis/consolidation of the right middle lobe with some debris within the right middle lobe bronchioles. Findings may be secondary to mucous plugging. Radiographic follow-up is recommended to ensure resolution to exclude the possibility of a endobronchial lesion. 2. Ascending thoracic aortic aneurysm measuring 4.2 cm. Recommend annual imaging followup by CTA or MRA. This recommendation follows 2010 ACCF/AHA/AATS/ACR/ASA/SCA/SCAI/SIR/STS/SVM Guidelines for the Diagnosis and Management of Patients with Thoracic Aortic Disease. Circulation. 2010; 121: U981-X914. Aortic aneurysm NOS (ICD10-I71.9) Electronically Signed   By: Duanne Guess D.O.   On: 04/07/2023 16:19   DG Chest 2 View Result Date: 04/07/2023 CLINICAL DATA:  SOB. EXAM: CHEST - 2 VIEW COMPARISON:  05/20/2022. FINDINGS: There is linear homogeneous opacity in the (right) middle lobe, favored to represent atelectasis/collapse. Correlate clinically for superimposed pneumonia. Bilateral lung fields are otherwise clear. Bilateral costophrenic angles are clear. Normal cardio-mediastinal silhouette. No acute osseous abnormalities. The soft tissues are within normal limits. IMPRESSION: *Middle lobe atelectasis/collapse. Correlate clinically for superimposed pneumonia. Electronically Signed   By: Jules Schick M.D.   On: 04/07/2023 12:16    Microbiology: Results for orders placed or performed during the hospital encounter of 04/07/23  Resp panel by RT-PCR (RSV, Flu A&B, Covid) Anterior Nasal Swab     Status: Abnormal   Collection Time: 04/07/23 11:32 AM   Specimen: Anterior Nasal Swab  Result Value Ref Range Status   SARS Coronavirus 2 by RT PCR NEGATIVE NEGATIVE Final    Comment: (NOTE) SARS-CoV-2 target nucleic acids are NOT DETECTED.  The SARS-CoV-2 RNA is generally detectable in upper respiratory specimens during the acute phase of infection. The lowest concentration of  SARS-CoV-2 viral copies this assay can detect is 138 copies/mL. A negative result does not preclude SARS-Cov-2 infection and should not be used as the sole basis for treatment or other patient management decisions. A negative result may occur with  improper specimen collection/handling, submission of specimen other than nasopharyngeal swab, presence of viral mutation(s) within the areas targeted by this assay, and inadequate number of viral copies(<138 copies/mL). A negative result must be combined with clinical observations, patient history, and epidemiological information. The expected result is Negative.  Fact Sheet for Patients:  BloggerCourse.com  Fact Sheet for Healthcare Providers:  SeriousBroker.it  This test is no t yet approved or cleared by the [place] FDA and  has been authorized for detection and/or diagnosis of SARS-CoV-2 by FDA under an Emergency Use Authorization (EUA). This EUA will remain  in effect (meaning this test can be used) for the duration of the COVID-19 declaration under Section 564(b)(1) of the Act, 21 U.S.C.section 360bbb-3(b)(1), unless the authorization is terminated  or revoked sooner.  Influenza A by PCR POSITIVE (A) NEGATIVE Final   Influenza B by PCR NEGATIVE NEGATIVE Final    Comment: (NOTE) The Xpert Xpress SARS-CoV-2/FLU/RSV plus assay is intended as an aid in the diagnosis of influenza from Nasopharyngeal swab specimens and should not be used as a sole basis for treatment. Nasal washings and aspirates are unacceptable for Xpert Xpress SARS-CoV-2/FLU/RSV testing.  Fact Sheet for Patients: BloggerCourse.com  Fact Sheet for Healthcare Providers: SeriousBroker.it  This test is not yet approved or cleared by the [place] FDA and has been authorized for detection and/or diagnosis of SARS-CoV-2 by FDA under an Emergency Use  Authorization (EUA). This EUA will remain in effect (meaning this test can be used) for the duration of the COVID-19 declaration under Section 564(b)(1) of the Act, 21 U.S.C. section 360bbb-3(b)(1), unless the authorization is terminated or revoked.     Resp Syncytial Virus by PCR NEGATIVE NEGATIVE Final    Comment: (NOTE) Fact Sheet for Patients: BloggerCourse.com  Fact Sheet for Healthcare Providers: SeriousBroker.it  This test is not yet approved or cleared by the [place] FDA and has been authorized for detection and/or diagnosis of SARS-CoV-2 by FDA under an Emergency Use Authorization (EUA). This EUA will remain in effect (meaning this test can be used) for the duration of the COVID-19 declaration under Section 564(b)(1) of the Act, 21 U.S.C. section 360bbb-3(b)(1), unless the authorization is terminated or revoked.  Performed at [facility], [address] [place], [place] [address]   MRSA Next Gen by PCR, Nasal     Status: None   Collection Time: 04/07/23  8:00 PM   Specimen: Nasal Mucosa; Nasal Swab  Result Value Ref Range Status   MRSA by PCR Next Gen NOT DETECTED NOT DETECTED Final    Comment: (NOTE) The GeneXpert MRSA Assay (FDA approved for NASAL specimens only), is one component of a comprehensive MRSA colonization surveillance program. It is not intended to diagnose MRSA infection nor to guide or monitor treatment for MRSA infections. Test performance is not FDA approved in patients less than 38 years old. Performed at [facility], [address] [place], [place] [address]     Labs: CBC: Recent Labs  Lab 04/07/23 1130 04/08/23 0627 04/09/23 0537 04/10/23 0522  WBC 6.6 5.7 7.5 6.0  HGB 12.6 13.0 13.6 13.8  HCT 39.5 39.8 41.3 41.7  MCV 88.6 86.5 86.6 85.8  PLT 186 219 212 210   Basic Metabolic Panel: Recent Labs  Lab 04/07/23 1130 04/08/23 0627 04/09/23 0537  04/10/23 0522  NA 138 139 139 139  K 4.0 3.8 3.4* 3.7  CL 104 104 103 101  CO2 24 25 27 28   GLUCOSE 260* 134* 88 103*  BUN 15 16 19 23   CREATININE 1.12* 0.77 0.79 0.92  CALCIUM 8.6* 9.2 9.1 9.3  MG  --   --   --  1.9   Liver Function Tests: Recent Labs  Lab 04/08/23 0627  AST 21  ALT 21  ALKPHOS 60  BILITOT 0.7  PROT 7.1  ALBUMIN 3.8   CBG: Recent Labs  Lab 04/09/23 1709 04/09/23 2103 04/10/23 0851 04/10/23 1039 04/10/23 1557  GLUCAP 79 184* 174* 121* 324*    Discharge time spent: greater than 30 minutes.  Signed: [name], MD [facility] 04/10/2023

## 2023-04-10 NOTE — Consult Note (Signed)
PHARMACY CONSULT NOTE - ELECTROLYTES  Pharmacy Consult for Electrolyte Monitoring and Replacement   Recent Labs: Potassium (mmol/L)  Date Value  04/10/2023 3.7  07/15/2013 4.1   Magnesium (mg/dL)  Date Value  81/19/1478 1.9  03/04/2012 1.8   Calcium (mg/dL)  Date Value  29/56/2130 9.3   Calcium, Total (mg/dL)  Date Value  86/57/8469 9.1   Albumin (g/dL)  Date Value  62/95/2841 3.8  10/24/2022 4.1  04/18/2011 4.0   Phosphorus (mg/dL)  Date Value  32/44/0102 4.1   Sodium (mmol/L)  Date Value  04/10/2023 139  10/24/2022 143  07/15/2013 138   Height: 5\' 5"  (165.1 cm) Weight: 117.9 kg (260 lb) IBW/kg (Calculated) : 57 Estimated Creatinine Clearance: 80.4 mL/min (by C-G formula based on SCr of 0.92 mg/dL).  Assessment  Nancy Patton is a 63 y.o. female presenting with respiratory failure secondary to influenza A and asthma exacerbation. PMH significant for obesity, asthma, hypertension, hyperlipidemia, type 2 diabetes, breast cancer . Pharmacy has been consulted to monitor and replace electrolytes.  Diet: Heart healthy / carb modified MIVF: N/A Pertinent medications: Hydrochlorothiazide 12.5 mg daily. Ordered Lasix 40 mg IV x 1 2/17  Goal of Therapy: Electrolytes within normal limits  Plan:  No electrolyte replacement indicated for today Follow-up electrolytes in AM  Thank you for involving pharmacy in this patient's care.   Rockwell Alexandria, PharmD Clinical Pharmacist 04/10/2023 7:19 AM

## 2023-04-10 NOTE — Progress Notes (Signed)
PULMONOLOGY         Date: 04/10/2023,   MRN# [id number] Nancy Patton 08-14-1960     AdmissionWeight: 117.9 kg                 CurrentWeight: 117.9 kg  Referring provider: Dr Sherryll Burger   CHIEF COMPLAINT:   Acute exacerbation of Asthma with influenza A infection   HISTORY OF PRESENT ILLNESS   63 yo with history of moderate persistent atopic allergic asthma who came in with acute on chronic hypoxemic respiratory failure secondary to viral respiratory tract infection from influenza A.  Reports 24 to 48 hours worth of myalgias malaise and worsening dyspnea prior to coming in.  She was treated with steroids and Tamiflu prior to coming into the ER however reports progressively worsening dyspnea and concerning labored breathing and came in for the symptoms.  While in the ER she was persistently positive for influenza on viral testing chest x-ray was performed with airspace and interstitial opacification worse on the right middle lung zone concerning for possible pneumonia.  Mild AKI noted on BMP stable chronic anemia on CBC with absence of leukocytosis.  She received IV steroids and has been transitioned to PO regimen.  She still is requiring supplemental O2.    04/10/23- patient is stable this am.  Her O2 was increased but cordage is >15ft so this may be falsely reading as low oxygenation.  She still works and would benefit from few days rest to recover. She can go home on PO regimen if OK after PT oxygenation evaluation.   PAST MEDICAL HISTORY   Past Medical History:  Diagnosis Date   Acute asthma exacerbation 12/18/2021   Acute respiratory distress 09/26/2014   Acute respiratory failure with hypoxia (HCC) 07/06/2021   Arthritis    Asthma    Asthma exacerbation 10/13/2021   Asthmatic bronchitis with acute exacerbation 09/26/2014   Asthmatic bronchitis with exacerbation 09/26/2014   Breast cancer (HCC)    Cancer of right breast, stage 1, estrogen receptor positive (HCC)  05/15/2019   Formatting of this note might be different from the original. Last Assessment & Plan: Formatting of this note is different from the original. - Resumed home letrozole 2.5 mg daily   CAP (community acquired pneumonia) 07/06/2021   COVID-19 virus infection 03/27/2022   Diabetes mellitus without complication (HCC)    History of breast cancer 11/06/2020   Hypertension    Hypokalemia 10/14/2021   Multifocal pneumonia 05/13/2021   Obesity    Personal history of radiation therapy    Pulmonary hypertension (HCC)    Sepsis (HCC) 07/05/2021   Sleep apnea      SURGICAL HISTORY   Past Surgical History:  Procedure Laterality Date   BREAST LUMPECTOMY Right 05/23/2019   invasive DCIS radation    CATARACT EXTRACTION     CESAREAN SECTION     x 3      FAMILY HISTORY   Family History  Problem Relation Age of Onset   Diabetes Mother    Stroke Mother    Diabetes Father    Diabetes Brother    Cancer Neg Hx    Heart failure Neg Hx    Hypertension Neg Hx    Breast cancer Neg Hx      SOCIAL HISTORY   Social History   Tobacco Use   Smoking status: Former    Average packs/day: 0.5 packs/day for 10.0 years (5.0 ttl pk-yrs)    Types: Cigarettes  Start date: 1982   Smokeless tobacco: Never   Tobacco comments:    Quit smoking 30 years ago.  Vaping Use   Vaping status: Never Used  Substance Use Topics   Alcohol use: No   Drug use: No     MEDICATIONS    Home Medication:    Current Medication:  Current Facility-Administered Medications:    amoxicillin-clavulanate (AUGMENTIN) 875-125 MG per tablet 1 tablet, 1 tablet, Oral, Q12H, Vida Rigger, MD, 1 tablet at 04/09/23 2150   enoxaparin (LOVENOX) injection 60 mg, 0.5 mg/kg, Subcutaneous, Q24H, [name], MD, 60 mg at 04/09/23 2150   losartan (COZAAR) tablet 50 mg, 50 mg, Oral, Daily, 50 mg at 04/09/23 1012 **AND** hydrochlorothiazide (HYDRODIURIL) tablet 12.5 mg, 12.5 mg, Oral, Daily, [name], [name],  [name], 12.5 mg at 04/09/23 1012   insulin aspart (novoLOG) injection 0-15 Units, 0-15 Units, Subcutaneous, TID AC & HS, [name], NP, 3 Units at 04/09/23 2150   insulin aspart (novoLOG) injection 4 Units, 4 Units, Subcutaneous, TID WC, [name], MD, 4 Units at 04/09/23 1416   ipratropium-albuterol (DUONEB) 0.5-2.5 (3) MG/3ML nebulizer solution 3 mL, 3 mL, Nebulization, Q4H PRN, [name], MD   ondansetron (ZOFRAN) tablet 4 mg, 4 mg, Oral, Q6H PRN **OR** ondansetron (ZOFRAN) injection 4 mg, 4 mg, Intravenous, Q6H PRN, [name], MD   Oral care mouth rinse, 15 mL, Mouth Rinse, PRN, Sherryll Burger, Vipul, MD   predniSONE (DELTASONE) tablet 45 mg, 45 mg, Oral, Q breakfast, Vida Rigger, MD    ALLERGIES   Patient has no known allergies.     REVIEW OF SYSTEMS    Review of Systems:  Gen:  Denies  fever, sweats, chills weigh loss  HEENT: Denies blurred vision, double vision, ear pain, eye pain, hearing loss, nose bleeds, sore throat Cardiac:  No dizziness, chest pain or heaviness, chest tightness,edema Resp:   reports dyspnea chronically  Gi: Denies swallowing difficulty, stomach pain, nausea or vomiting, diarrhea, constipation, bowel incontinence Gu:  Denies bladder incontinence, burning urine Ext:   Denies Joint pain, stiffness or swelling Skin: Denies  skin rash, easy bruising or bleeding or hives Endoc:  Denies polyuria, polydipsia , polyphagia or weight change Psych:   Denies depression, insomnia or hallucinations   Other:  All other systems negative   VS: BP 119/73 (BP Location: Left Arm)   Pulse 70   Temp (!) 97.5 F (36.4 C) (Oral)   Resp 20   Ht 5\' 5"  (1.651 m)   Wt 117.9 kg   SpO2 99%   BMI 43.27 kg/m      PHYSICAL EXAM    GENERAL:NAD, no fevers, chills, no weakness no fatigue HEAD: Normocephalic, atraumatic.  EYES: Pupils equal, round, reactive to light. Extraocular muscles intact. No scleral icterus.  MOUTH: Moist mucosal membrane.  Dentition intact. No abscess noted.  EAR, NOSE, THROAT: Clear without exudates. No external lesions.  NECK: Supple. No thyromegaly. No nodules. No JVD.  PULMONARY: decreased breath sounds with mild rhonchi worse at bases bilaterally.  CARDIOVASCULAR: S1 and S2. Regular rate and rhythm. No murmurs, rubs, or gallops. No edema. Pedal pulses 2+ bilaterally.  GASTROINTESTINAL: Soft, nontender, nondistended. No masses. Positive bowel sounds. No hepatosplenomegaly.  MUSCULOSKELETAL: No swelling, clubbing, or edema. Range of motion full in all extremities.  NEUROLOGIC: Cranial nerves II through XII are intact. No gross focal neurological deficits. Sensation intact. Reflexes intact.  SKIN: No ulceration, lesions, rashes, or cyanosis. Skin warm and dry. Turgor intact.  PSYCHIATRIC: Mood,  affect within normal limits. The patient is awake, alert and oriented x 3. Insight, judgment intact.       IMAGING   Narrative & Impression  CLINICAL DATA:  Respiratory illness, nondiagnostic xray   EXAM: CT CHEST WITHOUT CONTRAST   TECHNIQUE: Multidetector CT imaging of the chest was performed following the standard protocol without IV contrast.   RADIATION DOSE REDUCTION: This exam was performed according to the departmental dose-optimization program which includes automated exposure control, adjustment of the mA and/or kV according to patient size and/or use of iterative reconstruction technique.   COMPARISON:  X-ray 04/07/2023, CT 12/19/2021   FINDINGS: Cardiovascular: Normal heart size. No pericardial effusion. Mid ascending thoracic aorta measures 4.2 cm in diameter (previously 3.8 cm). Pulmonary trunk measures 3.6 cm in diameter.   Mediastinum/Nodes: No pathologically enlarged axillary or mediastinal lymph nodes. Evaluation of the hilar structures is limited in the absence of intravenous contrast. Within this limitation, no obvious hilar adenopathy or mass is identified. Thyroid gland, trachea,  and esophagus within normal limits.   Lungs/Pleura: Partial atelectasis/consolidation of the right middle lobe. There is some debris within the right middle lobe bronchioles. Bronchus intermedius is patent. The remaining lung fields are otherwise clear. No pleural effusion or pneumothorax.   Upper Abdomen: No acute abnormality.   Musculoskeletal: No chest wall mass or suspicious bone lesions identified.   IMPRESSION: 1. Partial atelectasis/consolidation of the right middle lobe with some debris within the right middle lobe bronchioles. Findings may be secondary to mucous plugging. Radiographic follow-up is recommended to ensure resolution to exclude the possibility of a endobronchial lesion. 2. Ascending thoracic aortic aneurysm measuring 4.2 cm. Recommend annual imaging followup by CTA or MRA. This recommendation follows 2010 ACCF/AHA/AATS/ACR/ASA/SCA/SCAI/SIR/STS/SVM Guidelines for the Diagnosis and Management of Patients with Thoracic Aortic Disease. Circulation. 2010; 121: Z610-R604. Aortic aneurysm NOS (ICD10-I71.9)     Electronically Signed   By: Duanne Guess D.O.   On: 04/07/2023 16:19     ASSESSMENT/PLAN    -Acute exacerbation of severe persistent asthma   - Patient on Fasenra injections on outpatient   - she reports insurance non coverage and possibly skipping dosing of Fasenra   -patient currently on steroids and antibiotics with nebulizer therapy   -she has received solumedrol and is now on prednisone   - she is on rocephin and zithromax have narrowed to augmentin   - we can change to augmentin tommorow with plan for 5-7 day course   - recommend to reduce prednisone by 5mg  daily currently on  40 mg      Atelectasis worse at RML   - have discussed with patient use of IS and flutter valve   -she is compliant and using it each hour            -repeat CXR  with atelectasis  Mucus plugging of tracheobronchial tree    - currently patient is being treated  with non invasive means     - she is using flutter valve to help expectorate mucus     - we discussed potentially needing airway inspection and therapeutic aspiration if this continues and persists despite full scope of medical therapy     Influenza A    - currently on Tamiflu     - patient is improving daily and is ambulatory now but still on oxygen.  She does have diarreah but its also improving       Thank you for allowing me to participate in the care of this  patient.   Patient/Family are satisfied with care plan and all questions have been answered.    Provider disclosure: Patient with at least one acute or chronic illness or injury that poses a threat to life or bodily function and is being managed actively during this encounter.  All of the below services have been performed independently by signing provider:  review of prior documentation from internal and or external health records.  Review of previous and current lab results.  Interview and comprehensive assessment during patient visit today. Review of current and previous chest radiographs/CT scans. Discussion of management and test interpretation with health care team and patient/family.   This document was prepared using Dragon voice recognition software and may include unintentional dictation errors.     Vida Rigger, M.D.  Division of Pulmonary & Critical Care Medicine

## 2023-04-11 NOTE — Telephone Encounter (Signed)
 noted

## 2023-04-11 NOTE — Telephone Encounter (Signed)
I spoke with pt who will plan on keeping appt already scheduled for 02/09/24 at [facility]. If weather worsens an pt cannot keep appt pt will call ans reschedule. Pt using oxygen at home and pt is able to speak in complete sentences while talking on phone. No distress at this time.pt will bring O2 wit her to appt as well. UC & ED precautions given and pt voiced understanding and appreciative of call. Sending note to Iven Finn NP as Lorain Childes.

## 2023-04-12 ENCOUNTER — Ambulatory Visit: Payer: 59 | Admitting: Nurse Practitioner

## 2023-04-12 VITALS — BP 122/78 | HR 70 | Temp 98.9°F | Ht 65.0 in | Wt 265.6 lb

## 2023-04-12 DIAGNOSIS — J4550 Severe persistent asthma, uncomplicated: Secondary | ICD-10-CM

## 2023-04-12 DIAGNOSIS — Z09 Encounter for follow-up examination after completed treatment for conditions other than malignant neoplasm: Secondary | ICD-10-CM | POA: Insufficient documentation

## 2023-04-12 DIAGNOSIS — I712 Thoracic aortic aneurysm, without rupture, unspecified: Secondary | ICD-10-CM | POA: Diagnosis not present

## 2023-04-12 NOTE — Assessment & Plan Note (Signed)
4.2 cm via CT scan.  Increased by 3.8 cm.  Will image annually per recommendations.  Patient does have comorbid risk factor of hypertension  but is well-controlled

## 2023-04-12 NOTE — Progress Notes (Signed)
Established Patient Office Visit  Subjective   Patient ID: Nancy Patton, female    DOB: 1961/01/16  Age: 63 y.o. MRN: [id number]  Chief Complaint  Patient presents with   Hospitalization Follow-up    Pt complains of feeling a little better. She is still feeling winded. States oxygen is still low.     HPI  Hospital follow-up: Patient went to the emergency department on 04/07/2023 for shortness of breath and influenza.  Patient arrived and diagnosed with the flu prior to going to the hospital.  Patient underwent a workup of the chest x-ray along with labs EKG and breathing treatments.  After treatments patient became hypoxic and required 2 L nasal cannula and IV Solu-Medrol was given.  Patient was admitted to the hospital for further management.  Patient was discharged on 04/10/2023.  Patient had a pulmonology consult, 04/08/2023.  Patient had a CT scan of the chest which showed debris likely secondary to mucous plugging they will radiographic follow-up to ensure resolution to exclude possibility of endobronchial lesion.  Patient also had a ascending thoracic aortic aneurysm measuring 4.2 cm.  Patient will need annual imaging to follow-up  She was discharged on 02 at 2L. She will use it as needed at home. She will use it at bedtime She is feeling weak since leaving the hospital.   States that she is on prednisone. She started it two days ago. And is on   She does work at [employer] and is on her feet 8 hours at a time. States that it is not strenuous.  Patient missed approximate 7 workdays currently.  She has not gone back to work.  Encourage patient to have placed in workup provided today.  The patient is FMLA paperwork filled out she can drop it off to the office and I will fill this out for her   Review of Systems  Constitutional:  Negative for chills and fever.  Respiratory:  Positive for shortness of breath (DOE).   Cardiovascular:  Negative for chest pain.   Neurological:  Negative for headaches.      Objective:     BP 122/78   Pulse 70   Temp 98.9 F (37.2 C) (Oral)   Ht 5\' 5"  (1.651 m)   Wt 265 lb 9.6 oz (120.5 kg)   SpO2 92%   BMI 44.20 kg/m  BP Readings from Last 3 Encounters:  04/12/23 122/78  04/10/23 128/79  04/05/23 110/80   Wt Readings from Last 3 Encounters:  04/12/23 265 lb 9.6 oz (120.5 kg)  04/07/23 260 lb (117.9 kg)  04/05/23 268 lb (121.6 kg)   SpO2 Readings from Last 3 Encounters:  04/12/23 92%  04/10/23 91%  04/05/23 95%      Physical Exam Vitals and nursing note reviewed.  Constitutional:      Appearance: Normal appearance.  Cardiovascular:     Rate and Rhythm: Normal rate and regular rhythm.     Heart sounds: Normal heart sounds.  Pulmonary:     Effort: Pulmonary effort is normal.     Comments: Decreased in RLL Neurological:     Mental Status: She is alert.      No results found for any visits on 04/12/23.    The 10-year ASCVD risk score (Arnett DK, et al., 2019) is: 8.1%    Assessment & Plan:   Problem List Items Addressed This Visit       Cardiovascular and Mediastinum   Thoracic aortic aneurysm without rupture (HCC)  4.2 cm via CT scan.  Increased by 3.8 cm.  Will image annually per recommendations.  Patient does have comorbid risk factor of hypertension  but is well-controlled        Respiratory   Severe persistent asthma, uncomplicated   Patient currently followed by pulmonology.  She was able to get her Harrington Challenger.  She is also maintained on Trelegy, albuterol, budesonide, Singulair.  Continue medication as prescribed follow-up with pulmonology as recommended        Other   Hospital discharge follow-up - Primary   Did review ED note, pulmonary consult note, inpatient discharge note.  Did review labs and imaging.       Return if symptoms worsen or fail to improve, for As scheduled .    Audria Nine, NP

## 2023-04-12 NOTE — Patient Instructions (Addendum)
Nice to see you today Keep me updated on how you feel next week I would wear the oxygen with any activity and at night until cleared by Pulmonology  Follow up with me as scheduled

## 2023-04-12 NOTE — Assessment & Plan Note (Signed)
Did review ED note, pulmonary consult note, inpatient discharge note.  Did review labs and imaging.

## 2023-04-12 NOTE — Assessment & Plan Note (Signed)
Patient currently followed by pulmonology.  She was able to get her Harrington Challenger.  She is also maintained on Trelegy, albuterol, budesonide, Singulair.  Continue medication as prescribed follow-up with pulmonology as recommended

## 2023-04-13 LAB — HM DIABETES EYE EXAM

## 2023-04-16 DIAGNOSIS — E119 Type 2 diabetes mellitus without complications: Secondary | ICD-10-CM | POA: Diagnosis not present

## 2023-04-16 DIAGNOSIS — H26493 Other secondary cataract, bilateral: Secondary | ICD-10-CM | POA: Diagnosis not present

## 2023-04-16 DIAGNOSIS — Z961 Presence of intraocular lens: Secondary | ICD-10-CM | POA: Diagnosis not present

## 2023-05-02 ENCOUNTER — Ambulatory Visit
Admission: RE | Admit: 2023-05-02 | Discharge: 2023-05-02 | Disposition: A | Discharge status: Home / Self Care | Payer: 59 | Source: Ambulatory Visit | Attending: Nurse Practitioner | Admitting: Nurse Practitioner

## 2023-05-02 DIAGNOSIS — Z1231 Encounter for screening mammogram for malignant neoplasm of breast: Secondary | ICD-10-CM | POA: Diagnosis not present

## 2023-05-07 ENCOUNTER — Encounter: Payer: Self-pay | Admitting: Nurse Practitioner

## 2023-05-11 ENCOUNTER — Other Ambulatory Visit: Payer: Self-pay | Admitting: Nurse Practitioner

## 2023-05-11 DIAGNOSIS — E119 Type 2 diabetes mellitus without complications: Secondary | ICD-10-CM

## 2023-05-16 MED ORDER — TRULICITY 0.75 MG/0.5ML ~~LOC~~ SOAJ
0.7500 mg | SUBCUTANEOUS | 0 refills | Status: DC
Start: 2023-05-16 — End: 2023-09-17

## 2023-05-18 DIAGNOSIS — Z6841 Body Mass Index (BMI) 40.0 and over, adult: Secondary | ICD-10-CM | POA: Diagnosis not present

## 2023-05-18 DIAGNOSIS — Z79899 Other long term (current) drug therapy: Secondary | ICD-10-CM | POA: Diagnosis not present

## 2023-05-18 DIAGNOSIS — E66813 Obesity, class 3: Secondary | ICD-10-CM | POA: Diagnosis not present

## 2023-05-18 DIAGNOSIS — J45901 Unspecified asthma with (acute) exacerbation: Secondary | ICD-10-CM | POA: Diagnosis not present

## 2023-05-18 DIAGNOSIS — Z7189 Other specified counseling: Secondary | ICD-10-CM | POA: Diagnosis not present

## 2023-06-05 ENCOUNTER — Other Ambulatory Visit: Payer: Self-pay | Admitting: Family

## 2023-06-05 DIAGNOSIS — M26601 Right temporomandibular joint disorder, unspecified: Secondary | ICD-10-CM

## 2023-06-15 DIAGNOSIS — Z7689 Persons encountering health services in other specified circumstances: Secondary | ICD-10-CM | POA: Diagnosis not present

## 2023-06-15 DIAGNOSIS — Z6841 Body Mass Index (BMI) 40.0 and over, adult: Secondary | ICD-10-CM | POA: Diagnosis not present

## 2023-06-15 DIAGNOSIS — Z01818 Encounter for other preprocedural examination: Secondary | ICD-10-CM | POA: Diagnosis not present

## 2023-06-15 DIAGNOSIS — Z87891 Personal history of nicotine dependence: Secondary | ICD-10-CM | POA: Diagnosis not present

## 2023-06-15 DIAGNOSIS — Z853 Personal history of malignant neoplasm of breast: Secondary | ICD-10-CM | POA: Diagnosis not present

## 2023-06-15 DIAGNOSIS — Z79899 Other long term (current) drug therapy: Secondary | ICD-10-CM | POA: Diagnosis not present

## 2023-06-15 DIAGNOSIS — I10 Essential (primary) hypertension: Secondary | ICD-10-CM | POA: Diagnosis not present

## 2023-06-15 DIAGNOSIS — E119 Type 2 diabetes mellitus without complications: Secondary | ICD-10-CM | POA: Diagnosis not present

## 2023-06-15 DIAGNOSIS — Z713 Dietary counseling and surveillance: Secondary | ICD-10-CM | POA: Diagnosis not present

## 2023-06-15 DIAGNOSIS — I712 Thoracic aortic aneurysm, without rupture, unspecified: Secondary | ICD-10-CM | POA: Diagnosis not present

## 2023-06-15 DIAGNOSIS — Z79811 Long term (current) use of aromatase inhibitors: Secondary | ICD-10-CM | POA: Diagnosis not present

## 2023-06-15 DIAGNOSIS — K219 Gastro-esophageal reflux disease without esophagitis: Secondary | ICD-10-CM | POA: Diagnosis not present

## 2023-06-18 ENCOUNTER — Encounter: Payer: Self-pay | Admitting: Nurse Practitioner

## 2023-06-20 ENCOUNTER — Ambulatory Visit: Payer: 59 | Admitting: Nurse Practitioner

## 2023-06-22 ENCOUNTER — Ambulatory Visit: Admitting: Nurse Practitioner

## 2023-06-22 VITALS — BP 108/70 | HR 69 | Temp 98.7°F | Ht 65.0 in | Wt 265.4 lb

## 2023-06-22 DIAGNOSIS — Z6841 Body Mass Index (BMI) 40.0 and over, adult: Secondary | ICD-10-CM | POA: Diagnosis not present

## 2023-06-22 DIAGNOSIS — E119 Type 2 diabetes mellitus without complications: Secondary | ICD-10-CM

## 2023-06-22 DIAGNOSIS — Z01818 Encounter for other preprocedural examination: Secondary | ICD-10-CM | POA: Diagnosis not present

## 2023-06-22 DIAGNOSIS — I10 Essential (primary) hypertension: Secondary | ICD-10-CM

## 2023-06-22 DIAGNOSIS — Z7689 Persons encountering health services in other specified circumstances: Secondary | ICD-10-CM | POA: Diagnosis not present

## 2023-06-22 DIAGNOSIS — J984 Other disorders of lung: Secondary | ICD-10-CM | POA: Diagnosis not present

## 2023-06-22 DIAGNOSIS — R079 Chest pain, unspecified: Secondary | ICD-10-CM | POA: Diagnosis not present

## 2023-06-22 DIAGNOSIS — Z7985 Long-term (current) use of injectable non-insulin antidiabetic drugs: Secondary | ICD-10-CM

## 2023-06-22 LAB — POCT GLYCOSYLATED HEMOGLOBIN (HGB A1C): Hemoglobin A1C: 6.1 % — AB (ref 4.0–5.6)

## 2023-06-22 NOTE — Patient Instructions (Signed)
 Nice to see you today Keep up the good work  Your A1C is 6.1% today. We will continue the medications as is Follow up with me in 6 months, sooner if you need me

## 2023-06-22 NOTE — Progress Notes (Signed)
 Established Patient Office Visit  Subjective   Patient ID: Nancy Patton, female    DOB: Sep 11, 1960  Age: 63 y.o. MRN: [id number]  Chief Complaint  Patient presents with   Follow-up    HPI  Dm2: patient is currently maintained on trulicity  0.75mg  weekly.. She is in the process of getting gastric weight loss surgery through [facility]. She does have the supplies.  She is on an eating plan with bariatrics with 3 meals and 2 snacks. Her goal is 100g of protien. Lower carbs and lower fat. States that she has lost 3 pounds already and just started the new eating pattern on Monday this week.    HTN: patient is currenlty on losartan -hydrochlorothiazide . She is tolerating the medication well and blood pressure is controlled  Pre-op: patient has messaged me via mychart and asked if I could do her preop surgical clearance. On todays visit she reached out to the team and they gave her locations to have the testing required done and will be cleared through a team there. We did not do surgical clearance today       Review of Systems  Constitutional:  Negative for chills and fever.  Respiratory:  Negative for shortness of breath.   Cardiovascular:  Negative for chest pain.  Neurological:  Negative for dizziness and headaches.      Objective:     BP 108/70   Pulse 69   Temp 98.7 F (37.1 C) (Oral)   Ht 5\' 5"  (1.651 m)   Wt 265 lb 6.4 oz (120.4 kg)   SpO2 95%   BMI 44.16 kg/m  BP Readings from Last 3 Encounters:  06/22/23 108/70  04/12/23 122/78  04/10/23 128/79   Wt Readings from Last 3 Encounters:  06/22/23 265 lb 6.4 oz (120.4 kg)  04/12/23 265 lb 9.6 oz (120.5 kg)  04/07/23 260 lb (117.9 kg)   SpO2 Readings from Last 3 Encounters:  06/22/23 95%  04/12/23 92%  04/10/23 91%      Physical Exam Vitals and nursing note reviewed.  Constitutional:      Appearance: Normal appearance.  Cardiovascular:     Rate and Rhythm: Normal rate and regular rhythm.     Heart  sounds: Normal heart sounds.  Pulmonary:     Effort: Pulmonary effort is normal.     Breath sounds: Normal breath sounds.  Neurological:     Mental Status: She is alert.      Results for orders placed or performed in visit on 06/22/23  POCT glycosylated hemoglobin (Hb A1C)  Result Value Ref Range   Hemoglobin A1C 6.1 (A) 4.0 - 5.6 %   HbA1c POC (<> result, manual entry)     HbA1c, POC (prediabetic range)     HbA1c, POC (controlled diabetic range)        The 10-year ASCVD risk score (Arnett DK, et al., 2019) is: 6.4%    Assessment & Plan:   Problem List Items Addressed This Visit       Cardiovascular and Mediastinum   Essential hypertension   Patient currently maintained on losartan -hydrochlorothiazide  50-12.5 mg daily.  Tolerating medication well.  Blood pressure well-controlled.  Continue medication as prescribed patient will need monitoring status post gastric surgery for possible decrease in medication if she does have precipitous weight loss.        Endocrine   Controlled type 2 diabetes mellitus without complication, without long-term current use of insulin  (HCC) - Primary   Patient currently well-controlled on regimen.  A1c is 6.1% today.  Patient currently maintained on the dulaglutide  0.75 mg once weekly.  Patient does have the supplies to check glucose at home but does not.  Tolerating medication well.  Continue medication as prescribed      Relevant Orders   POCT glycosylated hemoglobin (Hb A1C) (Completed)    Return in about 6 months (around 12/23/2023) for BP recheck, DM recheck.    Margarie Shay, NP

## 2023-06-22 NOTE — Assessment & Plan Note (Signed)
 Patient currently well-controlled on regimen.  A1c is 6.1% today.  Patient currently maintained on the dulaglutide  0.75 mg once weekly.  Patient does have the supplies to check glucose at home but does not.  Tolerating medication well.  Continue medication as prescribed

## 2023-06-22 NOTE — Assessment & Plan Note (Signed)
 Patient currently maintained on losartan -hydrochlorothiazide  50-12.5 mg daily.  Tolerating medication well.  Blood pressure well-controlled.  Continue medication as prescribed patient will need monitoring status post gastric surgery for possible decrease in medication if she does have precipitous weight loss.

## 2023-07-05 DIAGNOSIS — I10 Essential (primary) hypertension: Secondary | ICD-10-CM | POA: Diagnosis not present

## 2023-07-05 DIAGNOSIS — R0609 Other forms of dyspnea: Secondary | ICD-10-CM | POA: Diagnosis not present

## 2023-07-06 DIAGNOSIS — F4320 Adjustment disorder, unspecified: Secondary | ICD-10-CM | POA: Diagnosis not present

## 2023-07-06 DIAGNOSIS — Z8659 Personal history of other mental and behavioral disorders: Secondary | ICD-10-CM | POA: Diagnosis not present

## 2023-07-09 DIAGNOSIS — C50911 Malignant neoplasm of unspecified site of right female breast: Secondary | ICD-10-CM | POA: Diagnosis not present

## 2023-07-09 DIAGNOSIS — K219 Gastro-esophageal reflux disease without esophagitis: Secondary | ICD-10-CM | POA: Diagnosis not present

## 2023-07-09 DIAGNOSIS — Z6841 Body Mass Index (BMI) 40.0 and over, adult: Secondary | ICD-10-CM | POA: Diagnosis not present

## 2023-07-09 DIAGNOSIS — Z713 Dietary counseling and surveillance: Secondary | ICD-10-CM | POA: Diagnosis not present

## 2023-07-09 DIAGNOSIS — E119 Type 2 diabetes mellitus without complications: Secondary | ICD-10-CM | POA: Diagnosis not present

## 2023-07-09 DIAGNOSIS — I10 Essential (primary) hypertension: Secondary | ICD-10-CM | POA: Diagnosis not present

## 2023-07-09 DIAGNOSIS — Z170 Estrogen receptor positive status [ER+]: Secondary | ICD-10-CM | POA: Diagnosis not present

## 2023-07-13 DIAGNOSIS — Z713 Dietary counseling and surveillance: Secondary | ICD-10-CM | POA: Diagnosis not present

## 2023-07-13 DIAGNOSIS — Z6841 Body Mass Index (BMI) 40.0 and over, adult: Secondary | ICD-10-CM | POA: Diagnosis not present

## 2023-07-23 DIAGNOSIS — F4320 Adjustment disorder, unspecified: Secondary | ICD-10-CM | POA: Diagnosis not present

## 2023-07-23 DIAGNOSIS — Z8659 Personal history of other mental and behavioral disorders: Secondary | ICD-10-CM | POA: Diagnosis not present

## 2023-08-07 ENCOUNTER — Other Ambulatory Visit: Payer: Self-pay | Admitting: Oncology

## 2023-08-07 ENCOUNTER — Other Ambulatory Visit: Payer: Self-pay | Admitting: Family

## 2023-08-07 DIAGNOSIS — M26601 Right temporomandibular joint disorder, unspecified: Secondary | ICD-10-CM

## 2023-08-13 DIAGNOSIS — Z713 Dietary counseling and surveillance: Secondary | ICD-10-CM | POA: Diagnosis not present

## 2023-08-13 DIAGNOSIS — E119 Type 2 diabetes mellitus without complications: Secondary | ICD-10-CM | POA: Diagnosis not present

## 2023-08-13 DIAGNOSIS — Z6841 Body Mass Index (BMI) 40.0 and over, adult: Secondary | ICD-10-CM | POA: Diagnosis not present

## 2023-08-20 DIAGNOSIS — C50911 Malignant neoplasm of unspecified site of right female breast: Secondary | ICD-10-CM | POA: Diagnosis not present

## 2023-08-20 DIAGNOSIS — Z01818 Encounter for other preprocedural examination: Secondary | ICD-10-CM | POA: Diagnosis not present

## 2023-08-20 DIAGNOSIS — E66813 Obesity, class 3: Secondary | ICD-10-CM | POA: Diagnosis not present

## 2023-08-20 DIAGNOSIS — Z170 Estrogen receptor positive status [ER+]: Secondary | ICD-10-CM | POA: Diagnosis not present

## 2023-08-20 DIAGNOSIS — E119 Type 2 diabetes mellitus without complications: Secondary | ICD-10-CM | POA: Diagnosis not present

## 2023-08-20 DIAGNOSIS — Z9889 Other specified postprocedural states: Secondary | ICD-10-CM | POA: Diagnosis not present

## 2023-08-20 DIAGNOSIS — I10 Essential (primary) hypertension: Secondary | ICD-10-CM | POA: Diagnosis not present

## 2023-08-20 DIAGNOSIS — J45909 Unspecified asthma, uncomplicated: Secondary | ICD-10-CM | POA: Diagnosis not present

## 2023-08-20 DIAGNOSIS — I712 Thoracic aortic aneurysm, without rupture, unspecified: Secondary | ICD-10-CM | POA: Diagnosis not present

## 2023-08-20 DIAGNOSIS — Z6841 Body Mass Index (BMI) 40.0 and over, adult: Secondary | ICD-10-CM | POA: Diagnosis not present

## 2023-08-31 DIAGNOSIS — J8283 Eosinophilic asthma: Secondary | ICD-10-CM | POA: Diagnosis not present

## 2023-08-31 DIAGNOSIS — Z79899 Other long term (current) drug therapy: Secondary | ICD-10-CM | POA: Diagnosis not present

## 2023-08-31 DIAGNOSIS — J4541 Moderate persistent asthma with (acute) exacerbation: Secondary | ICD-10-CM | POA: Diagnosis not present

## 2023-08-31 DIAGNOSIS — Z01818 Encounter for other preprocedural examination: Secondary | ICD-10-CM | POA: Diagnosis not present

## 2023-09-05 ENCOUNTER — Other Ambulatory Visit: Payer: Self-pay | Admitting: Family

## 2023-09-07 DIAGNOSIS — Z713 Dietary counseling and surveillance: Secondary | ICD-10-CM | POA: Diagnosis not present

## 2023-09-07 DIAGNOSIS — Z6841 Body Mass Index (BMI) 40.0 and over, adult: Secondary | ICD-10-CM | POA: Diagnosis not present

## 2023-09-12 DIAGNOSIS — Z6841 Body Mass Index (BMI) 40.0 and over, adult: Secondary | ICD-10-CM | POA: Diagnosis not present

## 2023-09-12 DIAGNOSIS — E119 Type 2 diabetes mellitus without complications: Secondary | ICD-10-CM | POA: Diagnosis not present

## 2023-09-12 DIAGNOSIS — Z713 Dietary counseling and surveillance: Secondary | ICD-10-CM | POA: Diagnosis not present

## 2023-09-14 DIAGNOSIS — Z713 Dietary counseling and surveillance: Secondary | ICD-10-CM | POA: Diagnosis not present

## 2023-09-14 DIAGNOSIS — Z6841 Body Mass Index (BMI) 40.0 and over, adult: Secondary | ICD-10-CM | POA: Diagnosis not present

## 2023-09-15 ENCOUNTER — Other Ambulatory Visit: Payer: Self-pay | Admitting: Nurse Practitioner

## 2023-09-15 DIAGNOSIS — E119 Type 2 diabetes mellitus without complications: Secondary | ICD-10-CM

## 2023-09-24 DIAGNOSIS — K3189 Other diseases of stomach and duodenum: Secondary | ICD-10-CM | POA: Diagnosis not present

## 2023-09-24 DIAGNOSIS — K259 Gastric ulcer, unspecified as acute or chronic, without hemorrhage or perforation: Secondary | ICD-10-CM | POA: Diagnosis not present

## 2023-09-24 DIAGNOSIS — R12 Heartburn: Secondary | ICD-10-CM | POA: Diagnosis not present

## 2023-09-24 DIAGNOSIS — K2950 Unspecified chronic gastritis without bleeding: Secondary | ICD-10-CM | POA: Diagnosis not present

## 2023-09-25 DIAGNOSIS — Z713 Dietary counseling and surveillance: Secondary | ICD-10-CM | POA: Diagnosis not present

## 2023-09-25 DIAGNOSIS — E66813 Obesity, class 3: Secondary | ICD-10-CM | POA: Diagnosis not present

## 2023-09-27 DIAGNOSIS — K219 Gastro-esophageal reflux disease without esophagitis: Secondary | ICD-10-CM | POA: Diagnosis not present

## 2023-09-27 DIAGNOSIS — Z853 Personal history of malignant neoplasm of breast: Secondary | ICD-10-CM | POA: Diagnosis not present

## 2023-09-27 DIAGNOSIS — Z6841 Body Mass Index (BMI) 40.0 and over, adult: Secondary | ICD-10-CM | POA: Diagnosis not present

## 2023-09-27 DIAGNOSIS — Z713 Dietary counseling and surveillance: Secondary | ICD-10-CM | POA: Diagnosis not present

## 2023-10-07 ENCOUNTER — Other Ambulatory Visit: Payer: Self-pay | Admitting: Family

## 2023-10-07 DIAGNOSIS — M26601 Right temporomandibular joint disorder, unspecified: Secondary | ICD-10-CM

## 2023-10-23 ENCOUNTER — Encounter: Payer: Self-pay | Admitting: Nurse Practitioner

## 2023-10-24 NOTE — Telephone Encounter (Signed)
 You will need to see in office right?

## 2023-10-25 NOTE — Telephone Encounter (Signed)
Patient will need an office visit

## 2023-12-01 ENCOUNTER — Other Ambulatory Visit: Payer: Self-pay | Admitting: Family

## 2023-12-03 ENCOUNTER — Other Ambulatory Visit (HOSPITAL_COMMUNITY): Payer: Self-pay

## 2023-12-03 ENCOUNTER — Ambulatory Visit: Admitting: Nurse Practitioner

## 2023-12-03 ENCOUNTER — Telehealth: Payer: Self-pay

## 2023-12-03 VITALS — BP 100/60 | HR 69 | Temp 98.8°F | Ht 65.0 in | Wt 229.6 lb

## 2023-12-03 DIAGNOSIS — I10 Essential (primary) hypertension: Secondary | ICD-10-CM

## 2023-12-03 DIAGNOSIS — Z7985 Long-term (current) use of injectable non-insulin antidiabetic drugs: Secondary | ICD-10-CM

## 2023-12-03 DIAGNOSIS — E119 Type 2 diabetes mellitus without complications: Secondary | ICD-10-CM

## 2023-12-03 DIAGNOSIS — Z9884 Bariatric surgery status: Secondary | ICD-10-CM

## 2023-12-03 DIAGNOSIS — Z23 Encounter for immunization: Secondary | ICD-10-CM

## 2023-12-03 DIAGNOSIS — F325 Major depressive disorder, single episode, in full remission: Secondary | ICD-10-CM

## 2023-12-03 MED ORDER — TRULICITY 0.75 MG/0.5ML ~~LOC~~ SOAJ: 0.7500 mg | SUBCUTANEOUS | 0 refills | Status: AC

## 2023-12-03 MED ORDER — CITALOPRAM HYDROBROMIDE 40 MG PO TABS: 40.0000 mg | ORAL_TABLET | Freq: Every day | ORAL | 1 refills | Status: AC

## 2023-12-03 NOTE — Patient Instructions (Signed)
 Nice to see you today  We did update your flu vaccine today  Continue medications as is  Monitor for low sugars (hypoglycemia) AND dizziness/lightheadedness.   Follow up with me in 2 months, sooner if you need me

## 2023-12-03 NOTE — Telephone Encounter (Signed)
 Pharmacy Patient Advocate Encounter  Received notification from [facility] that Prior Authorization for Trulicity  0.75 has been APPROVED from 12/03/23 to 12/02/24. Ran test claim, Copay is $0.00. This test claim was processed through [facility]- copay amounts may vary at other pharmacies due to pharmacy/plan contracts, or as the patient moves through the different stages of their insurance plan.   PA #/Case ID/Reference #: # [id number]

## 2023-12-03 NOTE — Addendum Note (Signed)
 Addended by: SEBASTIAN SHU on: 12/03/2023 11:42 AM   Modules accepted: Orders

## 2023-12-03 NOTE — Telephone Encounter (Signed)
 Pharmacy Patient Advocate Encounter   Received notification from Onbase that prior authorization for Trulicity  0.75 is required/requested.   Insurance verification completed.   The patient is insured through [facility].   Per test claim: PA required; PA submitted to above mentioned insurance via Latent Key/confirmation #/EOC [id number] Status is pending

## 2023-12-03 NOTE — Progress Notes (Signed)
 Established Patient Office Visit  Subjective   Patient ID: Nancy Patton, female    DOB: [birth year]  Age: 63 y.o. MRN: [id number]  Chief Complaint  Patient presents with   Hospitalization Follow-up   Medication Refill    Celexa  and Trulicity     Flu Vaccine    Discussed the use of AI scribe software for clinical note transcription with the patient, who gave verbal consent to proceed.  History of Present Illness Carlisle MANHA AMATO is a 63 year old female who presents for post-operative follow-up after vertical sleeve gastrectomy.  She underwent a vertical sleeve gastrectomy on November 12, 2023, and is transitioning from a liquid diet to soft foods. She experiences difficulty adjusting to the new dietary restrictions, noting discomfort after consuming small amounts. There is a sensation of fullness and a need to eat slowly to avoid overeating. She is working on finding the right balance in her diet and is trying to incorporate protein through shakes, although she finds them overly sweet.  She is focusing on maintaining hydration and protein intake. She has been walking for exercise and is managing her hydration with small, frequent volumes. She is also trying to dilute her protein shakes with Fairlife milk to make them more palatable.  She is on Trulicity  0.75 mg for diabetes management, with her last A1c recorded at 6.3% prior to surgery. She is not currently checking her blood sugars at home but has a kit available. She is also on losartan  and hydrochlorothiazide  for blood pressure management, and she has experienced some lightheadedness when standing up.  She is taking citalopram  for mood management but has run out of her medication recently. Her mood is stable. She is also under the care of a pulmonologist for other conditions and is on Fasenra and Trelegy.  Her bowel movements have been irregular post-surgery, followed by more regular movements as she transitioned to  solid foods. She reports passing gas without difficulty.     Review of Systems  Constitutional:  Negative for chills and fever.  Respiratory:  Negative for shortness of breath.   Cardiovascular:  Negative for chest pain.  Gastrointestinal:  Negative for constipation and nausea.  Neurological:  Negative for dizziness and headaches.      Objective:     BP 100/60   Pulse 69   Temp 98.8 F (37.1 C) (Oral)   Ht 5' 5 (1.651 m)   Wt 229 lb 9.6 oz (104.1 kg)   SpO2 98%   BMI 38.21 kg/m  BP Readings from Last 3 Encounters:  12/03/23 100/60  06/22/23 108/70  04/12/23 122/78   Wt Readings from Last 3 Encounters:  12/03/23 229 lb 9.6 oz (104.1 kg)  06/22/23 265 lb 6.4 oz (120.4 kg)  04/12/23 265 lb 9.6 oz (120.5 kg)   SpO2 Readings from Last 3 Encounters:  12/03/23 98%  06/22/23 95%  04/12/23 92%      Physical Exam Vitals and nursing note reviewed.  Constitutional:      Appearance: Normal appearance.  Cardiovascular:     Rate and Rhythm: Normal rate and regular rhythm.     Heart sounds: Normal heart sounds.  Pulmonary:     Effort: Pulmonary effort is normal.     Breath sounds: Normal breath sounds.  Abdominal:     General: Bowel sounds are normal.  Skin:    Findings: Lesion present.      Neurological:     Mental Status: She is alert.  No results found for any visits on 12/03/23.    The 10-year ASCVD risk score (Arnett DK, et al., 2019) is: 6.7%    Assessment & Plan:   Problem List Items Addressed This Visit       Cardiovascular and Mediastinum   Essential hypertension - Primary     Endocrine   Controlled type 2 diabetes mellitus without complication, without long-term current use of insulin  (HCC)   Relevant Medications   Dulaglutide  (TRULICITY ) 0.75 MG/0.5ML SOAJ     Other   Depression, unspecified   Relevant Medications   citalopram  (CELEXA ) 40 MG tablet   Other Visit Diagnoses       S/P laparoscopic sleeve gastrectomy          Assessment and Plan Assessment & Plan Status post vertical sleeve gastrectomy One month post-surgery, transitioning to soft foods with early satiety. No surgical complications, granulation tissue at one site. Bowel movements resumed. - Monitor surgical sites for infection signs. - Encourage slow eating, small bites.  Type 2 diabetes mellitus post-bariatric surgery Well-controlled with Trulicity  0.75 mg weekly. Pre-surgery A1c was 6.3. Anticipated improved glycemic control with weight loss. Discussed hypoglycemia risk due to reduced intake. - Continue Trulicity  0.75 mg weekly. - Monitor blood glucose at home. - Re-evaluate diabetes management in two months.  Essential hypertension post-bariatric surgery Blood pressure low post-surgery. On losartan -hydrochlorothiazide  50-12.5 mg daily. Potential hypotension with weight loss. Reports lightheadedness when standing. - Monitor blood pressure at home. - Consider reducing hydrochlorothiazide  if hypotension symptoms persist.  Obesity, post-bariatric surgery Significant weight loss from 265 lbs to 229 lbs since May. Further loss anticipated. Discussed realistic goals and potential plateau. - Continue dietary and exercise regimen. - Set realistic weight goals. - Follow up in two months to assess weight loss.  Depression On citalopram , ran out recently but mood stable. No acute concerns. - Refill citalopram  prescription. - Monitor mood and adjust treatment.  General Health Maintenance Received flu shot during visit. - Administer flu shot.  Follow-Up Plan to reassess in two months for post-surgery progress, including weight, diabetes, and hypertension management. - Schedule follow-up appointment in two months.   Return in about 2 months (around 02/02/2024) for DM recheck.    Adina Crandall, NP

## 2023-12-05 ENCOUNTER — Encounter: Payer: Self-pay | Admitting: Nurse Practitioner

## 2023-12-06 NOTE — Telephone Encounter (Signed)
 Can we call the pharmacy and see if the trulicy is covered or just needs a PA?

## 2023-12-12 NOTE — Telephone Encounter (Signed)
 Contacted pharmacy,  No PA needed, trulicity  is covered,  Notified patient via mychart.

## 2023-12-21 ENCOUNTER — Other Ambulatory Visit: Payer: Self-pay | Admitting: Family

## 2023-12-28 ENCOUNTER — Ambulatory Visit: Admitting: Nurse Practitioner

## 2024-01-18 ENCOUNTER — Observation Stay
Admission: EM | Admit: 2024-01-18 | Discharge: 2024-01-20 | Disposition: A | Discharge status: Home / Self Care | Attending: General Surgery | Admitting: General Surgery

## 2024-01-18 ENCOUNTER — Emergency Department

## 2024-01-18 ENCOUNTER — Other Ambulatory Visit: Payer: Self-pay

## 2024-01-18 DIAGNOSIS — Z87891 Personal history of nicotine dependence: Secondary | ICD-10-CM | POA: Diagnosis not present

## 2024-01-18 DIAGNOSIS — K810 Acute cholecystitis: Secondary | ICD-10-CM | POA: Diagnosis present

## 2024-01-18 DIAGNOSIS — K819 Cholecystitis, unspecified: Secondary | ICD-10-CM

## 2024-01-18 DIAGNOSIS — K8012 Calculus of gallbladder with acute and chronic cholecystitis without obstruction: Secondary | ICD-10-CM | POA: Diagnosis not present

## 2024-01-18 DIAGNOSIS — E119 Type 2 diabetes mellitus without complications: Secondary | ICD-10-CM | POA: Insufficient documentation

## 2024-01-18 DIAGNOSIS — J45909 Unspecified asthma, uncomplicated: Secondary | ICD-10-CM | POA: Insufficient documentation

## 2024-01-18 DIAGNOSIS — R1013 Epigastric pain: Secondary | ICD-10-CM

## 2024-01-18 DIAGNOSIS — E66813 Obesity, class 3: Principal | ICD-10-CM

## 2024-01-18 DIAGNOSIS — I10 Essential (primary) hypertension: Secondary | ICD-10-CM | POA: Insufficient documentation

## 2024-01-18 LAB — HEPATIC FUNCTION PANEL
ALT: 12 U/L (ref 0–44)
AST: 22 U/L (ref 15–41)
Albumin: 4.4 g/dL (ref 3.5–5.0)
Alkaline Phosphatase: 68 U/L (ref 38–126)
Bilirubin, Direct: <0.1 mg/dL (ref 0.0–0.2)
Total Bilirubin: 0.2 mg/dL (ref 0.0–1.2)
Total Protein: 7.1 g/dL (ref 6.5–8.1)

## 2024-01-18 LAB — BASIC METABOLIC PANEL WITH GFR
Anion gap: 13 (ref 5–15)
BUN: 13 mg/dL (ref 8–23)
CO2: 26 mmol/L (ref 22–32)
Calcium: 9.5 mg/dL (ref 8.9–10.3)
Chloride: 106 mmol/L (ref 98–111)
Creatinine, Ser: 0.83 mg/dL (ref 0.44–1.00)
GFR, Estimated: >60 mL/min (ref >60)
Glucose, Bld: 113 mg/dL — ABNORMAL HIGH (ref 70–99)
Potassium: 3.2 mmol/L — ABNORMAL LOW (ref 3.5–5.1)
Sodium: 146 mmol/L — ABNORMAL HIGH (ref 135–145)

## 2024-01-18 LAB — CBC
HCT: 40.6 % (ref 36.0–46.0)
Hemoglobin: 13.4 g/dL (ref 12.0–15.0)
MCH: 29 pg (ref 26.0–34.0)
MCHC: 33 g/dL (ref 30.0–36.0)
MCV: 87.9 fL (ref 80.0–100.0)
Platelets: 229 K/uL (ref 150–400)
RBC: 4.62 MIL/uL (ref 3.87–5.11)
RDW: 13.1 % (ref 11.5–15.5)
WBC: 6.1 K/uL (ref 4.0–10.5)
nRBC: 0 % (ref 0.0–0.2)

## 2024-01-18 LAB — LIPASE, BLOOD: Lipase: 31 U/L (ref 11–51)

## 2024-01-18 LAB — TROPONIN T, HIGH SENSITIVITY
Troponin T High Sensitivity: <15 ng/L (ref 0–19)
Troponin T High Sensitivity: <15 ng/L (ref 0–19)

## 2024-01-18 MED ORDER — ONDANSETRON HCL 4 MG/2ML IJ SOLN: 4.0000 mg | Freq: Four times a day (QID) | INTRAMUSCULAR | Status: DC | PRN

## 2024-01-18 MED ORDER — LACTATED RINGERS IV SOLN: INTRAVENOUS | Status: AC

## 2024-01-18 MED ORDER — MORPHINE SULFATE (PF) 4 MG/ML IV SOLN
4.0000 mg | Freq: Once | INTRAVENOUS | Status: AC
  Administered 2024-01-18: 4 mg via INTRAVENOUS
  Filled 2024-01-18: qty 1

## 2024-01-18 MED ORDER — LACTATED RINGERS IV BOLUS
1000.0000 mL | Freq: Once | INTRAVENOUS | Status: AC
  Administered 2024-01-18: 1000 mL via INTRAVENOUS

## 2024-01-18 MED ORDER — OXYCODONE HCL 5 MG PO TABS
5.0000 mg | ORAL_TABLET | ORAL | Status: DC | PRN
  Administered 2024-01-19: 10 mg via ORAL
  Administered 2024-01-20: 5 mg via ORAL
  Filled 2024-01-18: qty 1
  Filled 2024-01-18: qty 2

## 2024-01-18 MED ORDER — MORPHINE SULFATE (PF) 2 MG/ML IV SOLN: 2.0000 mg | INTRAVENOUS | Status: DC | PRN

## 2024-01-18 MED ORDER — IOHEXOL 300 MG/ML  SOLN
100.0000 mL | Freq: Once | INTRAMUSCULAR | Status: AC | PRN
  Administered 2024-01-18: 100 mL via INTRAVENOUS

## 2024-01-18 MED ORDER — ONDANSETRON HCL 4 MG/2ML IJ SOLN
4.0000 mg | Freq: Once | INTRAMUSCULAR | Status: AC
  Administered 2024-01-18: 4 mg via INTRAVENOUS
  Filled 2024-01-18: qty 2

## 2024-01-18 MED ORDER — ONDANSETRON 4 MG PO TBDP: 4.0000 mg | ORAL_TABLET | Freq: Four times a day (QID) | ORAL | Status: DC | PRN

## 2024-01-18 MED ORDER — PIPERACILLIN-TAZOBACTAM 3.375 G IVPB
3.3750 g | Freq: Three times a day (TID) | INTRAVENOUS | Status: DC
  Administered 2024-01-18 – 2024-01-20 (×5): 3.375 g via INTRAVENOUS
  Filled 2024-01-18 (×5): qty 50

## 2024-01-18 MED ORDER — ACETAMINOPHEN 500 MG PO TABS
1000.0000 mg | ORAL_TABLET | Freq: Four times a day (QID) | ORAL | Status: DC
  Administered 2024-01-18 – 2024-01-20 (×5): 1000 mg via ORAL
  Filled 2024-01-18 (×5): qty 2

## 2024-01-18 NOTE — ED Triage Notes (Signed)
 Pt to ed from home via POV for CP and vomiting x 1-2 hours. Pt is caox4 and actively vomiting in triage.

## 2024-01-18 NOTE — ED Provider Notes (Signed)
 [facility] Provider Note    Event Date/Time   First MD Initiated Contact with Patient 01/18/24 2029     (approximate)   History   Chest Pain and Emesis   HPI  Nancy Patton is a 63 y.o. female who presents to the emergency department today because concerns for epigastric pain, nausea and vomiting.  Symptoms started suddenly this evening shortly after she ate.  Patient did undergo gastric sleeve procedure 2 months ago.  She does state that she feels like things have been going well with that since the surgery.  She denies any recent fevers.     Physical Exam   Triage Vital Signs: ED Triage Vitals [01/18/24 1959]  Encounter Vitals Group     BP (!) 142/60     Girls Systolic BP Percentile      Girls Diastolic BP Percentile      Boys Systolic BP Percentile      Boys Diastolic BP Percentile      Pulse Rate 70     Resp 20     Temp 98.4 F (36.9 C)     Temp Source Oral     SpO2 98 %     Weight      Height 5' 5 (1.651 m)     Head Circumference      Peak Flow      Pain Score 10     Pain Loc      Pain Education      Exclude from Growth Chart     Most recent vital signs: Vitals:   01/18/24 1959 01/18/24 2031  BP: (!) 142/60 106/66  Pulse: 70 (!) 56  Resp: 20 13  Temp: 98.4 F (36.9 C) 98.3 F (36.8 C)  SpO2: 98% 97%   General: Awake, alert, oriented. CV:  Good peripheral perfusion. Regular rate and rhythm. Resp:  Normal effort. Lungs clear. Abd:  No distention. Tender to palpation in the epigastric region. Abdomen soft.  ED Results / Procedures / Treatments   Labs (all labs ordered are listed, but only abnormal results are displayed) Labs Reviewed  BASIC METABOLIC PANEL WITH GFR - Abnormal; Notable for the following components:      Result Value   Sodium 146 (*)    Potassium 3.2 (*)    Glucose, Bld 113 (*)    All other components within normal limits  CBC  TROPONIN T, HIGH SENSITIVITY     EKG  I, Guadalupe Eagles,  attending physician, personally viewed and interpreted this EKG  EKG Time: 1960 Rate: 70 Rhythm: normal sinus rhythm Axis: normal Intervals: qtc 451 QRS: narrow ST changes: no st elevation Impression: normal ekg  RADIOLOGY I independently interpreted and visualized the CXR. My interpretation: No pneumonia Radiology interpretation:  IMPRESSION:  1. No acute cardiopulmonary process.      PROCEDURES:  Critical Care performed: No    MEDICATIONS ORDERED IN ED: Medications  ondansetron  (ZOFRAN ) injection 4 mg (4 mg Intravenous Given 01/18/24 2006)     IMPRESSION / MDM / ASSESSMENT AND PLAN / ED COURSE  I reviewed the triage vital signs and the nursing notes.                              Differential diagnosis includes, but is not limited to, operative complication, pancreatitis, gallbladder disease  Patient's presentation is most consistent with acute presentation with potential threat to life or bodily function.  Patient presented to the emergency department today because of concerns of epigastric pain, nausea and vomiting.  On exam patient is tender in the epigastric region.  Abdomen is soft.  Patient is afebrile.  Blood work without concerning lipase elevation or hepatic function panel elevation.  No leukocytosis.  I did obtain a CT scan given concern for possible postoperative complication.  While the gastric sleeve looked okay but did raise concerns for acute cholecystitis.  I discussed this with the patient.  Discussed with Dr. Marinda with surgery.  He will plan on admission for possible OR tomorrow.   FINAL CLINICAL IMPRESSION(S) / ED DIAGNOSES   Final diagnoses:  Epigastric pain  Cholecystitis      Note:  This document was prepared using Dragon voice recognition software and may include unintentional dictation errors.    Floy Roberts, MD 01/18/24 [phone]

## 2024-01-19 ENCOUNTER — Encounter: Admission: EM | Disposition: A | Payer: Self-pay | Source: Home / Self Care | Attending: General Surgery

## 2024-01-19 ENCOUNTER — Inpatient Hospital Stay: Admitting: Anesthesiology

## 2024-01-19 DIAGNOSIS — K810 Acute cholecystitis: Secondary | ICD-10-CM | POA: Diagnosis not present

## 2024-01-19 LAB — COMPREHENSIVE METABOLIC PANEL WITH GFR
ALT: 10 U/L (ref 0–44)
AST: 15 U/L (ref 15–41)
Albumin: 3.5 g/dL (ref 3.5–5.0)
Alkaline Phosphatase: 52 U/L (ref 38–126)
Anion gap: 8 (ref 5–15)
BUN: 10 mg/dL (ref 8–23)
CO2: 28 mmol/L (ref 22–32)
Calcium: 8.6 mg/dL — ABNORMAL LOW (ref 8.9–10.3)
Chloride: 108 mmol/L (ref 98–111)
Creatinine, Ser: 0.73 mg/dL (ref 0.44–1.00)
GFR, Estimated: >60 mL/min (ref >60)
Glucose, Bld: 96 mg/dL (ref 70–99)
Potassium: 3.4 mmol/L — ABNORMAL LOW (ref 3.5–5.1)
Sodium: 144 mmol/L (ref 135–145)
Total Bilirubin: 0.3 mg/dL (ref 0.0–1.2)
Total Protein: 5.5 g/dL — ABNORMAL LOW (ref 6.5–8.1)

## 2024-01-19 LAB — CBC
HCT: 34 % — ABNORMAL LOW (ref 36.0–46.0)
Hemoglobin: 11.3 g/dL — ABNORMAL LOW (ref 12.0–15.0)
MCH: 29.2 pg (ref 26.0–34.0)
MCHC: 33.2 g/dL (ref 30.0–36.0)
MCV: 87.9 fL (ref 80.0–100.0)
Platelets: 167 K/uL (ref 150–400)
RBC: 3.87 MIL/uL (ref 3.87–5.11)
RDW: 13.2 % (ref 11.5–15.5)
WBC: 5.4 K/uL (ref 4.0–10.5)
nRBC: 0 % (ref 0.0–0.2)

## 2024-01-19 LAB — GLUCOSE, CAPILLARY
Glucose-Capillary: 135 mg/dL — ABNORMAL HIGH (ref 70–99)
Glucose-Capillary: 137 mg/dL — ABNORMAL HIGH (ref 70–99)
Glucose-Capillary: 152 mg/dL — ABNORMAL HIGH (ref 70–99)
Glucose-Capillary: 217 mg/dL — ABNORMAL HIGH (ref 70–99)
Glucose-Capillary: 87 mg/dL (ref 70–99)

## 2024-01-19 SURGERY — CHOLECYSTECTOMY, ROBOT-ASSISTED, LAPAROSCOPIC
Anesthesia: General

## 2024-01-19 MED ORDER — PROPOFOL 10 MG/ML IV BOLUS
INTRAVENOUS | Status: DC | PRN
  Administered 2024-01-19: 160 mg via INTRAVENOUS

## 2024-01-19 MED ORDER — 0.9 % SODIUM CHLORIDE (POUR BTL) OPTIME
TOPICAL | Status: DC | PRN
  Administered 2024-01-19: 500 mL

## 2024-01-19 MED ORDER — FENTANYL CITRATE (PF) 100 MCG/2ML IJ SOLN: 25.0000 ug | INTRAMUSCULAR | Status: DC | PRN

## 2024-01-19 MED ORDER — CITALOPRAM HYDROBROMIDE 20 MG PO TABS
40.0000 mg | ORAL_TABLET | Freq: Every day | ORAL | Status: DC
  Administered 2024-01-19 – 2024-01-20 (×2): 40 mg via ORAL
  Filled 2024-01-19 (×2): qty 2

## 2024-01-19 MED ORDER — BUDESONIDE 0.25 MG/2ML IN SUSP
0.2500 mg | Freq: Two times a day (BID) | RESPIRATORY_TRACT | Status: DC
  Administered 2024-01-19 – 2024-01-20 (×2): 0.25 mg via RESPIRATORY_TRACT
  Filled 2024-01-19 (×2): qty 2

## 2024-01-19 MED ORDER — OXYCODONE HCL 5 MG PO TABS: 5.0000 mg | ORAL_TABLET | Freq: Once | ORAL | Status: DC | PRN

## 2024-01-19 MED ORDER — INDOCYANINE GREEN 25 MG IJ SOLR
1.2500 mg | Freq: Once | INTRAMUSCULAR | Status: AC
  Administered 2024-01-19: 1.25 mg via INTRAVENOUS

## 2024-01-19 MED ORDER — ROCURONIUM BROMIDE 10 MG/ML (PF) SYRINGE
PREFILLED_SYRINGE | INTRAVENOUS | Status: AC
  Filled 2024-01-19: qty 10

## 2024-01-19 MED ORDER — ACETAMINOPHEN 10 MG/ML IV SOLN
INTRAVENOUS | Status: AC
  Filled 2024-01-19: qty 100

## 2024-01-19 MED ORDER — BUPIVACAINE-EPINEPHRINE (PF) 0.5% -1:200000 IJ SOLN
INTRAMUSCULAR | Status: DC | PRN
  Administered 2024-01-19: 30 mL

## 2024-01-19 MED ORDER — FENTANYL CITRATE (PF) 100 MCG/2ML IJ SOLN
INTRAMUSCULAR | Status: DC | PRN
  Administered 2024-01-19 (×2): 50 ug via INTRAVENOUS

## 2024-01-19 MED ORDER — OXYCODONE HCL 5 MG/5ML PO SOLN: 5.0000 mg | Freq: Once | ORAL | Status: DC | PRN

## 2024-01-19 MED ORDER — FENTANYL CITRATE (PF) 100 MCG/2ML IJ SOLN
INTRAMUSCULAR | Status: AC
  Filled 2024-01-19: qty 2

## 2024-01-19 MED ORDER — MIDAZOLAM HCL 2 MG/2ML IJ SOLN
INTRAMUSCULAR | Status: AC
  Filled 2024-01-19: qty 2

## 2024-01-19 MED ORDER — DEXAMETHASONE SOD PHOSPHATE PF 10 MG/ML IJ SOLN
INTRAMUSCULAR | Status: DC | PRN
  Administered 2024-01-19: 10 mg via INTRAVENOUS

## 2024-01-19 MED ORDER — ALBUTEROL SULFATE (2.5 MG/3ML) 0.083% IN NEBU: 2.5000 mg | INHALATION_SOLUTION | Freq: Four times a day (QID) | RESPIRATORY_TRACT | Status: DC | PRN

## 2024-01-19 MED ORDER — MONTELUKAST SODIUM 10 MG PO TABS
10.0000 mg | ORAL_TABLET | Freq: Every day | ORAL | Status: DC
  Administered 2024-01-19 – 2024-01-20 (×2): 10 mg via ORAL
  Filled 2024-01-19 (×2): qty 1

## 2024-01-19 MED ORDER — GLYCOPYRROLATE 0.2 MG/ML IJ SOLN
INTRAMUSCULAR | Status: DC | PRN
  Administered 2024-01-19: .2 mg via INTRAVENOUS

## 2024-01-19 MED ORDER — ALBUTEROL SULFATE HFA 108 (90 BASE) MCG/ACT IN AERS: 2.0000 | INHALATION_SPRAY | Freq: Four times a day (QID) | RESPIRATORY_TRACT | Status: DC | PRN

## 2024-01-19 MED ORDER — DEXMEDETOMIDINE HCL IN NACL 80 MCG/20ML IV SOLN
INTRAVENOUS | Status: DC | PRN
  Administered 2024-01-19: 8 ug via INTRAVENOUS

## 2024-01-19 MED ORDER — INDOCYANINE GREEN 25 MG IJ SOLR
INTRAMUSCULAR | Status: AC
Start: 2024-01-19 — End: 2024-01-19
  Filled 2024-01-19: qty 10

## 2024-01-19 MED ORDER — LIDOCAINE HCL (CARDIAC) PF 100 MG/5ML IV SOSY
PREFILLED_SYRINGE | INTRAVENOUS | Status: DC | PRN
  Administered 2024-01-19: 50 mg via INTRAVENOUS

## 2024-01-19 MED ORDER — INSULIN ASPART 100 UNIT/ML IJ SOLN
0.0000 [IU] | Freq: Three times a day (TID) | INTRAMUSCULAR | Status: DC
  Administered 2024-01-19: 5 [IU] via SUBCUTANEOUS
  Filled 2024-01-19: qty 1

## 2024-01-19 MED ORDER — ACETAMINOPHEN 10 MG/ML IV SOLN
INTRAVENOUS | Status: DC | PRN
  Administered 2024-01-19: 1000 mg via INTRAVENOUS

## 2024-01-19 MED ORDER — MIDAZOLAM HCL (PF) 2 MG/2ML IJ SOLN
INTRAMUSCULAR | Status: DC | PRN
  Administered 2024-01-19: 2 mg via INTRAVENOUS

## 2024-01-19 MED ORDER — SUGAMMADEX SODIUM 200 MG/2ML IV SOLN
INTRAVENOUS | Status: DC | PRN
  Administered 2024-01-19: 200 mg via INTRAVENOUS

## 2024-01-19 MED ORDER — SUCCINYLCHOLINE CHLORIDE 200 MG/10ML IV SOSY
PREFILLED_SYRINGE | INTRAVENOUS | Status: DC | PRN
  Administered 2024-01-19: 100 mg via INTRAVENOUS

## 2024-01-19 MED ORDER — BUPIVACAINE-EPINEPHRINE (PF) 0.5% -1:200000 IJ SOLN
INTRAMUSCULAR | Status: AC
  Filled 2024-01-19: qty 30

## 2024-01-19 MED ORDER — ONDANSETRON HCL 4 MG/2ML IJ SOLN
INTRAMUSCULAR | Status: DC | PRN
  Administered 2024-01-19: 4 mg via INTRAVENOUS

## 2024-01-19 MED ORDER — SODIUM CHLORIDE 0.9 % IR SOLN
Status: DC | PRN
  Administered 2024-01-19: 1

## 2024-01-19 MED ORDER — ROCURONIUM BROMIDE 100 MG/10ML IV SOLN
INTRAVENOUS | Status: DC | PRN
  Administered 2024-01-19: 20 mg via INTRAVENOUS
  Administered 2024-01-19: 50 mg via INTRAVENOUS

## 2024-01-19 MED ORDER — SUCCINYLCHOLINE CHLORIDE 200 MG/10ML IV SOSY
PREFILLED_SYRINGE | INTRAVENOUS | Status: AC
Start: 2024-01-19 — End: 2024-01-19
  Filled 2024-01-19: qty 10

## 2024-01-19 MED ORDER — LACTATED RINGERS IV BOLUS
1000.0000 mL | Freq: Once | INTRAVENOUS | Status: AC
  Administered 2024-01-19: 1000 mL via INTRAVENOUS

## 2024-01-19 SURGICAL SUPPLY — 38 items
BAG PRESSURE INF REUSE 1000 (BAG) IMPLANT
CANNULA REDUCER 12-8 DVNC XI (CANNULA) ×2 IMPLANT
CAUTERY HOOK MNPLR 1.6 DVNC XI (INSTRUMENTS) ×2 IMPLANT
CLIP APPLIE XI LRG REUSE DVNC (INSTRUMENTS) IMPLANT
CLIP LIGATING HEM O LOK PURPLE (MISCELLANEOUS) IMPLANT
CLIP LIGATING HEMO O LOK GREEN (MISCELLANEOUS) ×2 IMPLANT
DEFOGGER SCOPE WARM SEASHARP (MISCELLANEOUS) ×2 IMPLANT
DERMABOND ADVANCED .7 DNX12 (GAUZE/BANDAGES/DRESSINGS) ×2 IMPLANT
DRAPE ARM DVNC X/XI (DISPOSABLE) ×8 IMPLANT
DRAPE COLUMN DVNC XI (DISPOSABLE) ×2 IMPLANT
ELECTRODE REM PT RTRN 9FT ADLT (ELECTROSURGICAL) ×2 IMPLANT
FORCEPS BPLR 8 MD DVNC XI (FORCEP) IMPLANT
FORCEPS BPLR R/ABLATION 8 DVNC (INSTRUMENTS) ×2 IMPLANT
FORCEPS PROGRASP DVNC XI (FORCEP) ×2 IMPLANT
GLOVE BIOGEL PI IND STRL 7.5 (GLOVE) ×4 IMPLANT
GLOVE SURG SYN 7.0 PF PI (GLOVE) ×4 IMPLANT
GOWN STRL REUS W/ TWL LRG LVL3 (GOWN DISPOSABLE) ×6 IMPLANT
GRASPER SUT TROCAR 14GX15 (MISCELLANEOUS) ×2 IMPLANT
HANDLE YANKAUER SUCT BULB TIP (MISCELLANEOUS) IMPLANT
IRRIGATOR SUCT 8 DISP DVNC XI (IRRIGATION / IRRIGATOR) IMPLANT
IV 0.9% NACL 1000 ML (IV SOLUTION) IMPLANT
KIT PINK PAD W/HEAD ARM REST (MISCELLANEOUS) ×2 IMPLANT
LABEL OR SOLS (LABEL) ×2 IMPLANT
MANIFOLD NEPTUNE II (INSTRUMENTS) ×2 IMPLANT
NDL HYPO 22X1.5 SAFETY MO (MISCELLANEOUS) ×2 IMPLANT
NDL INSUFFLATION 14GA 120MM (NEEDLE) ×2 IMPLANT
NS IRRIG 500ML POUR BTL (IV SOLUTION) ×2 IMPLANT
OBTURATOR OPTICALSTD 8 DVNC (TROCAR) ×2 IMPLANT
PACK LAP CHOLECYSTECTOMY (MISCELLANEOUS) ×2 IMPLANT
SEAL UNIV 5-12 XI (MISCELLANEOUS) ×8 IMPLANT
SET TUBE SMOKE EVAC HIGH FLOW (TUBING) ×2 IMPLANT
SOLN STERILE WATER 500 ML (IV SOLUTION) ×2 IMPLANT
SOLUTION ELECTROSURG ANTI STCK (MISCELLANEOUS) ×2 IMPLANT
SPIKE FLUID TRANSFER (MISCELLANEOUS) ×4 IMPLANT
SPONGE T-LAP 4X18 ~~LOC~~+RFID (SPONGE) IMPLANT
SUT VICRYL 0 UR6 27IN ABS (SUTURE) ×2 IMPLANT
SUTURE MNCRL 4-0 27XMF (SUTURE) ×2 IMPLANT
SYSTEM BAG RETRIEVAL 10MM (BASKET) ×2 IMPLANT

## 2024-01-19 NOTE — Transfer of Care (Signed)
 Immediate Anesthesia Transfer of Care Note  Patient: Nancy Patton  Procedure(s) Performed: CHOLECYSTECTOMY, ROBOT-ASSISTED, LAPAROSCOPIC  Patient Location: PACU  Anesthesia Type:General  Level of Consciousness: sedated  Airway & Oxygen  Therapy: Patient Spontanous Breathing and Patient connected to face mask oxygen   Post-op Assessment: Report given to RN and Post -op Vital signs reviewed and stable  Post vital signs: Reviewed  Last Vitals:  Vitals Value Taken Time  BP    Temp    Pulse    Resp    SpO2      Last Pain:      Patients Stated Pain Goal: 3 (01/18/24 2242)  Complications: No notable events documented.

## 2024-01-19 NOTE — Anesthesia Preprocedure Evaluation (Signed)
 Anesthesia Evaluation  Patient identified by MRN, date of birth, ID band Patient awake    Reviewed: Allergy & Precautions, NPO status , Patient's Chart, lab work & pertinent test results  History of Anesthesia Complications Negative for: history of anesthetic complications  Airway Mallampati: III  TM Distance: <3 FB Neck ROM: full    Dental  (+) Chipped   Pulmonary asthma , sleep apnea , former smoker   Pulmonary exam normal        Cardiovascular Exercise Tolerance: Good hypertension, (-) Past MI negative cardio ROS Normal cardiovascular exam     Neuro/Psych  PSYCHIATRIC DISORDERS      negative neurological ROS     GI/Hepatic Neg liver ROS,GERD  Controlled,,  Endo/Other  diabetes, Type 2    Renal/GU      Musculoskeletal   Abdominal   Peds  Hematology negative hematology ROS (+)   Anesthesia Other Findings Past Medical History: 12/18/2021: Acute asthma exacerbation 09/26/2014: Acute respiratory distress 07/06/2021: Acute respiratory failure with hypoxia (HCC) No date: Arthritis No date: Asthma 10/13/2021: Asthma exacerbation 09/26/2014: Asthmatic bronchitis with acute exacerbation 09/26/2014: Asthmatic bronchitis with exacerbation No date: Breast cancer (HCC) 05/15/2019: Cancer of right breast, stage 1, estrogen receptor  positive (HCC)     Comment:  Formatting of this note might be different from the               original. Last Assessment & Plan: Formatting of this note              is different from the original. - Resumed home letrozole                2.5 mg daily 07/06/2021: CAP (community acquired pneumonia) 03/27/2022: COVID-19 virus infection No date: Diabetes mellitus without complication (HCC) 11/06/2020: History of breast cancer No date: Hypertension 10/14/2021: Hypokalemia 05/13/2021: Multifocal pneumonia No date: Obesity No date: Personal history of radiation therapy No date: Pulmonary  hypertension (HCC) 07/05/2021: Sepsis (HCC) No date: Sleep apnea  Past Surgical History: 05/23/2019: BREAST LUMPECTOMY; Right     Comment:  invasive DCIS radation  No date: CATARACT EXTRACTION No date: CESAREAN SECTION     Comment:  x 3   BMI    Body Mass Index: 36.87 kg/m      Reproductive/Obstetrics negative OB ROS                              Anesthesia Physical Anesthesia Plan  ASA: 3  Anesthesia Plan: General ETT and Rapid Sequence   Post-op Pain Management:    Induction: Intravenous  PONV Risk Score and Plan: Ondansetron , Dexamethasone , Midazolam  and Treatment may vary due to age or medical condition  Airway Management Planned: Oral ETT  Additional Equipment:   Intra-op Plan:   Post-operative Plan: Extubation in OR  Informed Consent: I have reviewed the patients History and Physical, chart, labs and discussed the procedure including the risks, benefits and alternatives for the proposed anesthesia with the patient or authorized representative who has indicated his/her understanding and acceptance.     Dental Advisory Given  Plan Discussed with: Anesthesiologist, CRNA and Surgeon  Anesthesia Plan Comments: (Patient consented for risks of anesthesia including but not limited to:  - adverse reactions to medications - damage to eyes, teeth, lips or other oral mucosa - nerve damage due to positioning  - sore throat or hoarseness - Damage to heart, brain, nerves, lungs, other parts of body or loss of  life  Patient voiced understanding and assent.)        Anesthesia Quick Evaluation

## 2024-01-19 NOTE — Op Note (Signed)
 Robotic assisted laparoscopic Cholecystectomy  Pre-operative Diagnosis: Acute Cholecystitis  Post-operative Diagnosis: Same  Procedure:  Robotic assisted laparoscopic Cholecystectomy  Surgeon: Jayson Endow, MD  Anesthesia: Gen. with endotracheal tube  Findings: Inlfamed and distended gallbladder, critical view of safety obtained  Estimated Blood Loss: cc       Specimens: Gallbladder           Complications: none   Procedure Details  The patient was seen again in the Holding Room. The benefits, complications, treatment options, and expected outcomes were discussed with the patient. The risks of bleeding, infection, recurrence of symptoms, failure to resolve symptoms, bile duct damage, bile duct leak, retained common bile duct stone, bowel injury, any of which could require further surgery and/or ERCP, stent, or papillotomy were reviewed with the patient. The likelihood of improving the patient's symptoms with return to their baseline status is good.  The patient and/or family concurred with the proposed plan, giving informed consent.  The patient was taken to Operating Room, identified  and the procedure verified as robotic Cholecystectomy.  A Time Out was held and the above information confirmed.  Prior to the induction of general anesthesia, antibiotic prophylaxis was administered. VTE prophylaxis was in place. General endotracheal anesthesia was then administered and tolerated well. After the induction, the abdomen was prepped with Chloraprep and draped in the sterile fashion. The patient was positioned in the supine position.  A veress needle was inserted into the abdomen using standard drop technique. An 8mm infra-umbilical robotic port was then placed under direct visualization. There was no injury noted at the site of veress needle insertion. Two right sided abdominal 8mm ports followed by an 8mm left abdominal robotic ports were placed under direct visualization. The left sided  abdominal port was then upsized to a 12mm robotic port.  The patient was positioned  in reverse Trendelenburg, robot was brought to the surgical field and docked in the standard fashion.  We made sure all the instrumentation was kept indirect view at all times and that there were no collision between the arms. I scrubbed out and went to the console.  The gallbladder was identified, the fundus grasped and retracted cephalad. Adhesions were lysed bluntly. The infundibulum was grasped and retracted laterally, exposing the peritoneum overlying the triangle of Calot. This was then divided and exposed in a blunt fashion. An extended critical view of the cystic duct and cystic artery was obtained.  The cystic duct was clearly identified and bluntly dissected.   Artery and duct were double clipped and divided. Using ICG cholangiography we visualized the cystic duct. The gallbladder was taken from the gallbladder fossa in a retrograde fashion with the electrocautery.  Hemostasis was achieved with the electrocautery. nspection of the right upper quadrant was performed. No bleeding, bile duct injury or leak, or bowel injury was noted. Robotic instruments and robotic arms were undocked in the standard fashion.  I scrubbed back in.  The gallbladder was removed and placed in an Endocatch bag.   The left lower quadrant fascia was then closed with a 0 vicryl using a suture needle passer. The pre-peritoneal space was then infiltrated with liposomal bupivicaine and marcaine  solution. Pneumoperitoneum was released.  4-0 subcuticular Monocryl was used to close the skin. Dermabond was  applied.  The patient was then extubated and brought to the recovery room in stable condition. Sponge, lap, and needle counts were correct at closure and at the conclusion of the case.  Jayson Endow, M.D. [facility]

## 2024-01-19 NOTE — Plan of Care (Signed)

## 2024-01-19 NOTE — H&P (Signed)
 Patient ID: Nancy Patton, female   DOB: 09-Dec-1960, 63 y.o.   MRN: [id number] CC: Acute Cholecystitis History of Present Illness Nancy Patton is a 63 y.o. female with past medical history consistent with obesity and breast cancer who presents in consultation for acute cholecystitis.  The patient recently had a sleeve in September 2025.  She reports she has done well from the standpoint.  However, last night she developed epigastric pain.  This was also associated with some nausea.  The pain persisted so she came to the ED for evaluation.  She denies radiation of the pain but describes it as sharp.  She did have an episode of dry heaving when she got to the hospital but denies any fevers and chills.  She had a CT scan that was concerning for acute cholecystitis.  Past Medical History Past Medical History:  Diagnosis Date   Acute asthma exacerbation 12/18/2021   Acute respiratory distress 09/26/2014   Acute respiratory failure with hypoxia (HCC) 07/06/2021   Arthritis    Asthma    Asthma exacerbation 10/13/2021   Asthmatic bronchitis with acute exacerbation 09/26/2014   Asthmatic bronchitis with exacerbation 09/26/2014   Breast cancer (HCC)    Cancer of right breast, stage 1, estrogen receptor positive (HCC) 05/15/2019   Formatting of this note might be different from the original. Last Assessment & Plan: Formatting of this note is different from the original. - Resumed home letrozole  2.5 mg daily   CAP (community acquired pneumonia) 07/06/2021   COVID-19 virus infection 03/27/2022   Diabetes mellitus without complication (HCC)    History of breast cancer 11/06/2020   Hypertension    Hypokalemia 10/14/2021   Multifocal pneumonia 05/13/2021   Obesity    Personal history of radiation therapy    Pulmonary hypertension (HCC)    Sepsis (HCC) 07/05/2021   Sleep apnea        Past Surgical History:  Procedure Laterality Date   BREAST LUMPECTOMY Right 05/23/2019   invasive  DCIS radation    CATARACT EXTRACTION     CESAREAN SECTION     x 3     No Known Allergies  Current Facility-Administered Medications  Medication Dose Route Frequency Provider Last Rate Last Admin   acetaminophen  (TYLENOL ) tablet 1,000 mg  1,000 mg Oral Q6H Marinda Jayson KIDD, MD   1,000 mg at 01/19/24 0511   indocyanine green  (IC-GREEN ) injection 1.25 mg  1.25 mg Intravenous Once Marinda Jayson KIDD, MD       lactated ringers  infusion   Intravenous Continuous Marinda Jayson KIDD, MD 75 mL/hr at 01/19/24 0109 New Bag at 01/19/24 0109   morphine  (PF) 2 MG/ML injection 2 mg  2 mg Intravenous Q2H PRN Marinda Jayson KIDD, MD       ondansetron  (ZOFRAN -ODT) disintegrating tablet 4 mg  4 mg Oral Q6H PRN Marinda Jayson KIDD, MD       Or   ondansetron  (ZOFRAN ) injection 4 mg  4 mg Intravenous Q6H PRN Marinda Jayson KIDD, MD       oxyCODONE  (Oxy IR/ROXICODONE ) immediate release tablet 5-10 mg  5-10 mg Oral Q4H PRN Marinda Jayson KIDD, MD       piperacillin -tazobactam (ZOSYN ) IVPB 3.375 g  3.375 g Intravenous Q8H Marinda Jayson KIDD, MD 12.5 mL/hr at 01/19/24 0508 3.375 g at 01/19/24 9491    Family History Family History  Problem Relation Age of Onset   Diabetes Mother    Stroke Mother    Diabetes Father    Diabetes  Brother    Cancer Neg Hx    Heart failure Neg Hx    Hypertension Neg Hx    Breast cancer Neg Hx        Social History Social History   Tobacco Use   Smoking status: Former    Average packs/day: 0.5 packs/day for 10.0 years (5.0 ttl pk-yrs)    Types: Cigarettes    Start date: 1982   Smokeless tobacco: Never   Tobacco comments:    Quit smoking 30 years ago.  Vaping Use   Vaping status: Never Used  Substance Use Topics   Alcohol use: No   Drug use: No        ROS Full ROS of systems performed and is otherwise negative there than what is stated in the HPI  Physical Exam Blood pressure (!) 91/55, pulse 65, temperature 98.5 F (36.9 C), temperature source Oral, resp. rate 18, height 5'  5 (1.651 m), weight 100.5 kg, SpO2 93%.  Alert and oriented x 3, normal work of breathing room air, regular rate and rhythm, abdomen is obese, soft, laparoscopic incisions are well-healed, mild tenderness in the epigastrium and right upper quadrant without any rebound tenderness or guarding.  Mood and affect appropriate Data Reviewed CT scan reviewed and she has a slightly distended gallbladder with multiple stones within the gallbladder lumen her labs are consistent with a normal total bilirubin  I have personally reviewed the patient's imaging and medical records.    Assessment    Patient with acute cholecystitis  Plan    Plan for robotic assisted cholecystectomy.  We will use ICG.  I discussed the risk, benefits alternatives of the procedure including risk infection, bleeding, injury to common bile duct, retained stone, bile leak, need for open procedure.  She understands these risks and wishes to proceed with surgery    Jayson MALVA Endow 01/19/2024, 9:08 AM

## 2024-01-19 NOTE — Anesthesia Procedure Notes (Signed)
 Procedure Name: Intubation Date/Time: 01/19/2024 10:47 AM  Performed by: Leontine Katz, CRNAPre-anesthesia Checklist: Patient identified, Patient being monitored, Timeout performed, Emergency Drugs available and Suction available Patient Re-evaluated:Patient Re-evaluated prior to induction Oxygen  Delivery Method: Circle system utilized Preoxygenation: Pre-oxygenation with 100% oxygen  Induction Type: IV induction and Rapid sequence Laryngoscope Size: 3 and McGrath Grade View: Grade I Tube type: Oral Tube size: 7.0 mm Number of attempts: 1 Airway Equipment and Method: Stylet and Video-laryngoscopy Placement Confirmation: ETT inserted through vocal cords under direct vision, positive ETCO2 and breath sounds checked- equal and bilateral Secured at: 21 cm Tube secured with: Tape Dental Injury: Teeth and Oropharynx as per pre-operative assessment

## 2024-01-19 NOTE — Anesthesia Postprocedure Evaluation (Signed)
 Anesthesia Post Note  Patient: Darlin S McClain-Morris  Procedure(s) Performed: CHOLECYSTECTOMY, ROBOT-ASSISTED, LAPAROSCOPIC  Patient location during evaluation: PACU Anesthesia Type: General Level of consciousness: awake and alert Pain management: pain level controlled Vital Signs Assessment: post-procedure vital signs reviewed and stable Respiratory status: spontaneous breathing, nonlabored ventilation and respiratory function stable Cardiovascular status: blood pressure returned to baseline and stable Postop Assessment: no apparent nausea or vomiting Anesthetic complications: no   No notable events documented.   Last Vitals:  Vitals:   01/19/24 1322 01/19/24 1347  BP: 105/75 119/77  Pulse: (!) 57 (!) 55  Resp: 12 13  Temp:  36.8 C  SpO2: 100% 100%    Last Pain:  Vitals:   01/19/24 1418  TempSrc:   PainSc: 9                  Meegan Shanafelt K Masie Bermingham

## 2024-01-20 ENCOUNTER — Other Ambulatory Visit: Payer: Self-pay

## 2024-01-20 ENCOUNTER — Encounter: Payer: Self-pay | Admitting: General Surgery

## 2024-01-20 LAB — COMPREHENSIVE METABOLIC PANEL WITH GFR
ALT: 25 U/L (ref 0–44)
AST: 32 U/L (ref 15–41)
Albumin: 3.8 g/dL (ref 3.5–5.0)
Alkaline Phosphatase: 57 U/L (ref 38–126)
Anion gap: 9 (ref 5–15)
BUN: 8 mg/dL (ref 8–23)
CO2: 29 mmol/L (ref 22–32)
Calcium: 9 mg/dL (ref 8.9–10.3)
Chloride: 106 mmol/L (ref 98–111)
Creatinine, Ser: 0.81 mg/dL (ref 0.44–1.00)
GFR, Estimated: >60 mL/min (ref >60)
Glucose, Bld: 110 mg/dL — ABNORMAL HIGH (ref 70–99)
Potassium: 3.7 mmol/L (ref 3.5–5.1)
Sodium: 143 mmol/L (ref 135–145)
Total Bilirubin: 0.3 mg/dL (ref 0.0–1.2)
Total Protein: 6 g/dL — ABNORMAL LOW (ref 6.5–8.1)

## 2024-01-20 LAB — GLUCOSE, CAPILLARY: Glucose-Capillary: 82 mg/dL (ref 70–99)

## 2024-01-20 MED ORDER — OXYCODONE HCL 5 MG PO TABS
5.0000 mg | ORAL_TABLET | Freq: Four times a day (QID) | ORAL | 0 refills | Status: DC | PRN
  Filled 2024-01-20: qty 15, 4d supply, fill #0

## 2024-01-20 NOTE — Progress Notes (Signed)
 Discharge instructions given to patient, questions answered. IV removed without complications. Patient transporting home via car by her friend.

## 2024-01-20 NOTE — Plan of Care (Signed)
   Problem: Education: Goal: Knowledge of General Education information will improve Description: Including pain rating scale, medication(s)/side effects and non-pharmacologic comfort measures Outcome: Progressing   Problem: Health Behavior/Discharge Planning: Goal: Ability to manage health-related needs will improve Outcome: Progressing   Problem: Activity: Goal: Risk for activity intolerance will decrease Outcome: Progressing

## 2024-01-20 NOTE — Discharge Summary (Signed)
 Patient ID: Nancy Patton MRN: [id number] DOB/AGE: [birth year] 63 y.o.  Admit date: 01/18/2024 Discharge date: 01/20/2024   Discharge Diagnoses:  Principal Problem:   Acute cholecystitis   Procedures:robotic assisted cholecystectomy  Hospital Course:   admitted with findings consistent with acute cholecystitis and  was taken promptly to the operating room for an uneventful laparoscopic robotic cholecystectomy.  Patient was kept overnight.  The time of discharge the patient was ambulating,  pain was controlled.  Her vital signs were stable and she was afebrile.   physical exam at discharge showed a pt  in no acute distress.  Awake and alert.  Abdomen: Soft incisions healing well without infection or peritonitis.  Extremities well-perfused and no edema.  Condition of the patient the time of discharge was stable   Consults: None  Disposition: Discharge disposition: 01-Home or Self Care       Discharge Instructions     Call MD for:  difficulty breathing, headache or visual disturbances   Complete by: As directed    Call MD for:  extreme fatigue   Complete by: As directed    Call MD for:  hives   Complete by: As directed    Call MD for:  persistant dizziness or light-headedness   Complete by: As directed    Call MD for:  persistant nausea and vomiting   Complete by: As directed    Call MD for:  redness, tenderness, or signs of infection (pain, swelling, redness, odor or green/yellow discharge around incision site)   Complete by: As directed    Call MD for:  severe uncontrolled pain   Complete by: As directed    Call MD for:  temperature >100.4   Complete by: As directed    Diet - low sodium heart healthy   Complete by: As directed    Discharge instructions   Complete by: As directed    You have surgical glue over your incisions.  This will peel off over the next 3 to 4 weeks.  You may shower on postoperative day 1.  Please let the warm water  run over your  incisions and pat dry.  Please do not submerge the wounds in water  for 2 weeks.  You can use Tylenol  and ibuprofen  as needed for pain.  Please avoid heavy lifting greater than 10 to 15 pounds for 4 weeks.  You should follow-up in our office in 2 weeks.  You may use ice over your incisions   Increase activity slowly   Complete by: As directed       Allergies as of 01/20/2024   No Known Allergies      Medication List     TAKE these medications    albuterol  108 (90 Base) MCG/ACT inhaler Commonly known as: VENTOLIN  HFA Inhale 2 puffs into the lungs every 6 (six) hours as needed for wheezing or shortness of breath.   budesonide  0.25 MG/2ML nebulizer solution Commonly known as: PULMICORT  Take 0.25 mg by nebulization in the morning and at bedtime.   citalopram  40 MG tablet Commonly known as: CELEXA  Take 1 tablet (40 mg total) by mouth daily.   cyanocobalamin  1000 MCG tablet Commonly known as: VITAMIN B12 Take 1,000 mcg by mouth.   Fasenra Pen 30 MG/ML prefilled autoinjector Generic drug: benralizumab Inject 30 mg into the skin every 8 (eight) weeks.   ipratropium-albuterol  0.5-2.5 (3) MG/3ML Soln Commonly known as: DUONEB INHALE 1 VIAL VIA NEBULIZER THREE TIMES DAILY AS NEEDED   letrozole  2.5 MG tablet Commonly  known as: FEMARA  TAKE 1 TABLET BY MOUTH EVERY DAY   losartan -hydrochlorothiazide  50-12.5 MG tablet Commonly known as: HYZAAR TAKE 1 TABLET BY MOUTH EVERY DAY   montelukast  10 MG tablet Commonly known as: SINGULAIR  TAKE 1 TABLET BY MOUTH EVERY DAY   oxyCODONE  5 MG immediate release tablet Commonly known as: Oxy IR/ROXICODONE  Take 1 tablet (5 mg total) by mouth every 6 (six) hours as needed for severe pain (pain score 7-10).   Trelegy Ellipta  100-62.5-25 MCG/ACT Aepb Generic drug: Fluticasone -Umeclidin-Vilant Take 1 puff by mouth daily.   Trulicity  0.75 MG/0.5ML Soaj Generic drug: Dulaglutide  Inject 0.75 mg into the skin once a week.   Vitamin D3 1.25 MG  (50000 UT) Caps TAKE 1 CAPSULE BY MOUTH ONE TIME PER WEEK          Jayson Endow, M.D.

## 2024-01-21 ENCOUNTER — Telehealth: Payer: Self-pay

## 2024-01-21 NOTE — Transitions of Care (Post Inpatient/ED Visit) (Signed)
   01/21/2024  Name: Nancy Patton MRN: [id number] DOB: Apr 22, 1960  Today's TOC FU Call Status: Today's TOC FU Call Status:: Unsuccessful Call (1st Attempt) Unsuccessful Call (1st Attempt) Date: 01/21/24  Attempted to reach the patient regarding the most recent Inpatient/ED visit.  Follow Up Plan: Additional outreach attempts will be made to reach the patient to complete the Transitions of Care (Post Inpatient/ED visit) call.   Arvin Seip RN, BSN, CCM [employer], Population Health Case Manager Phone: [phone]

## 2024-01-22 ENCOUNTER — Telehealth: Payer: Self-pay

## 2024-01-22 LAB — SURGICAL PATHOLOGY

## 2024-01-22 NOTE — Transitions of Care (Post Inpatient/ED Visit) (Signed)
   01/22/2024  Name: Nancy Patton MRN: [id number] DOB: 1961/02/05  Today's TOC FU Call Status: Today's TOC FU Call Status:: Unsuccessful Call (2nd Attempt) Unsuccessful Call (2nd Attempt) Date: 01/22/24  Attempted to reach the patient regarding the most recent Inpatient/ED visit.  Follow Up Plan: Additional outreach attempts will be made to reach the patient to complete the Transitions of Care (Post Inpatient/ED visit) call.   Arvin Seip RN, BSN, CCM [employer], Population Health Case Manager Phone: [phone]

## 2024-01-23 ENCOUNTER — Telehealth: Payer: Self-pay | Admitting: General Surgery

## 2024-01-23 NOTE — Telephone Encounter (Signed)
 Pt had surgery on 02-18-24 Acute cholecystitis and is wanting to make sure she can be out for 4 weeks from her job.Pt phone # is [phone]

## 2024-01-24 ENCOUNTER — Telehealth: Payer: Self-pay

## 2024-01-24 NOTE — Transitions of Care (Post Inpatient/ED Visit) (Signed)
   01/24/2024  Name: Nancy Patton MRN: [id number] DOB: [birth year]  Today's TOC FU Call Status: Today's TOC FU Call Status:: Unsuccessful Call (3rd Attempt) Unsuccessful Call (3rd Attempt) Date: 01/24/24  Attempted to reach the patient regarding the most recent Inpatient/ED visit.  Follow Up Plan: No further outreach attempts will be made at this time. We have been unable to contact the patient.  Alan Ee, RN, BSN, CEN [employer]- Transition of Care Team.  [facility] [phone]

## 2024-01-30 ENCOUNTER — Encounter: Payer: Self-pay | Admitting: Physician Assistant

## 2024-01-30 ENCOUNTER — Ambulatory Visit: Admitting: Physician Assistant

## 2024-01-30 VITALS — BP 139/76 | HR 64 | Temp 98.2°F | Ht 65.0 in | Wt 217.2 lb

## 2024-01-30 DIAGNOSIS — Z09 Encounter for follow-up examination after completed treatment for conditions other than malignant neoplasm: Secondary | ICD-10-CM

## 2024-01-30 DIAGNOSIS — K810 Acute cholecystitis: Secondary | ICD-10-CM

## 2024-01-30 NOTE — Patient Instructions (Signed)

## 2024-01-30 NOTE — Progress Notes (Signed)
 [facility] POST-OP OFFICE VISIT  01/30/2024  HPI: Nancy Patton is a 63 y.o. female 11 days s/p robotic assisted laparoscopic cholecystectomy for acute cholecystitis with Dr Marinda  She is doing well She was sore for about the first 7 days; now pain is quite mild No fever, chills, nausea, emesis She is tolerating PO; decreased appetite, no diarrhea Incisions are healing well No other complaints   Vital signs: BP 139/76   Pulse 64   Temp 98.2 F (36.8 C) (Oral)   Ht 5' 5 (1.651 m)   Wt 217 lb 3.2 oz (98.5 kg)   SpO2 93%   BMI 36.14 kg/m    Physical Exam: Constitutional: Well appearing female, NAD Abdomen: Soft, non-tender, non-distended, no rebound/guarding Skin: Laparoscopic incisions are healing well, no erythema or drainage   Assessment/Plan: This is a 63 y.o. female 11 days s/p robotic assisted laparoscopic cholecystectomy for acute cholecystitis with Dr Marinda   - Pain control prn  - Reviewed wound care recommendation  - Reviewed lifting restrictions; 4 weeks total - Work note given   - Reviewed surgical pathology; CCC  - She can follow up on as needed basis; She understands to call with questions/concerns  -- Arthea Platt, PA-C [facility] 01/30/2024, 2:47 PM M-F: 7am - 4pm

## 2024-02-01 ENCOUNTER — Ambulatory Visit: Admitting: Nurse Practitioner

## 2024-02-01 VITALS — BP 118/84 | HR 67 | Temp 98.3°F | Ht 65.0 in | Wt 217.4 lb

## 2024-02-01 DIAGNOSIS — Z7984 Long term (current) use of oral hypoglycemic drugs: Secondary | ICD-10-CM | POA: Diagnosis not present

## 2024-02-01 DIAGNOSIS — E119 Type 2 diabetes mellitus without complications: Secondary | ICD-10-CM

## 2024-02-01 LAB — POCT GLYCOSYLATED HEMOGLOBIN (HGB A1C): Hemoglobin A1C: 5.3 % (ref 4.0–5.6)

## 2024-02-01 NOTE — Progress Notes (Signed)
 Established Patient Office Visit  Subjective   Patient ID: Nancy Patton, female    DOB: 07-Sep-1960  Age: 63 y.o. MRN: [id number]  Chief Complaint  Patient presents with   Diabetes    Discussed the use of AI scribe software for clinical note transcription with the patient, who gave verbal consent to proceed.  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  She underwent vertical sleeve gastrectomy on January 19, 2024, and has experienced significant weight loss since the procedure. Initially, she experienced pain and required oxycodone , but has transitioned to using Tylenol  as needed. Pain has improved, and she reports increased mobility, stating she feels great today.  She continues to take Trulicity . Her A1c is 5.3.  Post-surgery, she experienced some loose stools, which she attributes to dietary changes and the absence of a gallbladder. She has not experienced constipation or issues with urination. She is conscious of staying hydrated, noting that her urine was a little yellow, prompting her to increase fluid intake.  She is currently not engaging in much physical activity due to recent surgery and is not yet cleared to return to work, as her job involves lifting, which she is advised to avoid until February 25, 2024. Her appetite is gradually returning, but she finds she cannot tolerate certain foods, particularly sweets, which she finds too sweet now.  No fever, chills, dizziness, or lightheadedness. No constipation with pain medication or Trulicity  and no issues with urination.     Review of Systems  Constitutional:  Negative for chills and fever.  Respiratory:  Negative for shortness of breath.   Cardiovascular:  Negative for chest pain.  Gastrointestinal:  Positive for diarrhea. Negative for constipation, nausea and vomiting.   Neurological:  Negative for dizziness.      Objective:     BP 118/84   Pulse 67   Temp 98.3 F (36.8 C) (Oral)   Ht 5' 5 (1.651 m)   Wt 217 lb 6.4 oz (98.6 kg)   SpO2 99%   BMI 36.18 kg/m  BP Readings from Last 3 Encounters:  02/01/24 118/84  01/30/24 139/76  01/20/24 138/72   Wt Readings from Last 3 Encounters:  02/01/24 217 lb 6.4 oz (98.6 kg)  01/30/24 217 lb 3.2 oz (98.5 kg)  01/19/24 221 lb 9 oz (100.5 kg)   SpO2 Readings from Last 3 Encounters:  02/01/24 99%  01/30/24 93%  01/20/24 95%      Physical Exam Vitals and nursing note reviewed.  Constitutional:      Appearance: Normal appearance.  Cardiovascular:     Rate and Rhythm: Normal rate and regular rhythm.     Heart sounds: Normal heart sounds.  Pulmonary:     Effort: Pulmonary effort is normal.     Breath sounds: Normal breath sounds.  Abdominal:     General: Bowel sounds are normal.      Comments: Well appearing/healing laparoscopic incision sites  Neurological:     Mental Status: She is alert.      Results for orders placed or performed in visit on 02/01/24  POCT glycosylated hemoglobin (Hb A1C)  Result Value Ref Range   Hemoglobin A1C 5.3 4.0 - 5.6 %   HbA1c POC (<> result, manual entry)     HbA1c, POC (prediabetic range)     HbA1c, POC (controlled diabetic range)        The  10-year ASCVD risk score (Arnett DK, et al., 2019) is: 9.2%    Assessment & Plan:   Problem List Items Addressed This Visit       Endocrine   Controlled type 2 diabetes mellitus without complication, without long-term current use of insulin  (HCC) - Primary   Relevant Orders   POCT glycosylated hemoglobin (Hb A1C) (Completed)  Assessment and Plan Assessment & Plan Status post vertical sleeve gastrectomy Postoperative recovery progressing well. Healing incisions. Loose stools likely due to dietary changes and absence of gallbladder. Hydration concern due to reduced stomach capacity. - Continue dietary  adjustments for loose stools. - Encouraged small, frequent sips of water  for hydration. - Advised to avoid heavy lifting until cleared by surgeon.  Controlled type 2 diabetes mellitus without complication Blood glucose well-controlled, A1c 5.3. No hypoglycemia. Trulicity  aiding weight loss. - Continue Trulicity  0.75 mg subcutaneous weekly. - Provided education on hypoglycemia signs and symptoms. - Monitor blood glucose levels regularly.  Essential hypertension Blood pressure slightly elevated but acceptable. No dizziness or lightheadedness. Weight loss may require future antihypertensive adjustments. - Continue current antihypertensive regimen. - Monitor for hypotension symptoms as weight loss progresses. - Advised to report dizziness or lightheadedness.  Obesity Weight loss ongoing post-surgery, plateau noted. Appetite returning, dietary adjustments ongoing. Trulicity  aiding weight management. - Continue current weight management strategies. - Encouraged gradual increase in physical activity as tolerated.   Return in about 3 months (around 05/01/2024) for BP recheck/weight/DM.    Adina Crandall, NP

## 2024-02-01 NOTE — Patient Instructions (Signed)
 Nice to see you today  We will continue the medications without change  Your A1C was 5.3%, this is considered normal! Follow up with me in 3 months, sooner if you need me

## 2024-02-05 ENCOUNTER — Telehealth: Payer: Self-pay | Admitting: *Deleted

## 2024-02-05 NOTE — Telephone Encounter (Signed)
Faxed FMLA to [employer] at [phone] °

## 2024-02-25 ENCOUNTER — Other Ambulatory Visit (HOSPITAL_COMMUNITY): Payer: Self-pay

## 2024-02-25 ENCOUNTER — Telehealth: Payer: Self-pay

## 2024-02-25 NOTE — Telephone Encounter (Signed)
 Pharmacy Patient Advocate Encounter   Received notification from [facility] KEY that prior authorization for Trulicity  0.75 is required/requested.   Insurance verification completed.   The patient is insured through [facility].   Faxed  completed form and clinical criteria to [facility] [phone]

## 2024-02-26 NOTE — Telephone Encounter (Unsigned)
 Copied from CRM [id number]. Topic: General - Other >> Feb 25, 2024  5:37 PM Drema MATSU wrote: Reason for CRM: [facility] Pharmacy Authotization team wants to verify diagnosis code for Trulicity . She said that on the chart it says diabetes mellitis type 2 and she is needing the ICD 10 code for that.

## 2024-03-21 ENCOUNTER — Other Ambulatory Visit: Payer: Self-pay | Admitting: Family

## 2024-03-21 LAB — OPHTHALMOLOGY REPORT-SCANNED

## 2024-03-22 ENCOUNTER — Other Ambulatory Visit: Payer: Self-pay | Admitting: Nurse Practitioner

## 2024-03-26 ENCOUNTER — Encounter: Payer: Self-pay | Admitting: Nurse Practitioner

## 2024-05-02 ENCOUNTER — Ambulatory Visit: Admitting: Nurse Practitioner
